# Patient Record
Sex: Female | Born: 1935
Health system: Southern US, Community
[De-identification: ages and names within clinical notes are randomized; demographics above are authoritative.]

## PROBLEM LIST (undated history)

## (undated) DIAGNOSIS — E785 Hyperlipidemia, unspecified: Secondary | ICD-10-CM

## (undated) DIAGNOSIS — T7840XA Allergy, unspecified, initial encounter: Secondary | ICD-10-CM

## (undated) DIAGNOSIS — G459 Transient cerebral ischemic attack, unspecified: Secondary | ICD-10-CM

## (undated) DIAGNOSIS — I10 Essential (primary) hypertension: Secondary | ICD-10-CM

## (undated) DIAGNOSIS — K219 Gastro-esophageal reflux disease without esophagitis: Secondary | ICD-10-CM

## (undated) HISTORY — PX: CARPAL TUNNEL RELEASE: SHX101

## (undated) HISTORY — DX: Allergy, unspecified, initial encounter: T78.40XA

## (undated) HISTORY — DX: Essential (primary) hypertension: I10

## (undated) HISTORY — PX: BUNIONECTOMY: SHX129

## (undated) HISTORY — PX: OTHER SURGICAL HISTORY: SHX169

## (undated) HISTORY — DX: Gastro-esophageal reflux disease without esophagitis: K21.9

## (undated) HISTORY — PX: ABDOMINAL HYSTERECTOMY: SHX81

## (undated) HISTORY — DX: Transient cerebral ischemic attack, unspecified: G45.9

## (undated) HISTORY — DX: Hyperlipidemia, unspecified: E78.5

## (undated) HISTORY — PX: UMBILICAL HERNIA REPAIR: SHX196

---

## 1998-03-18 ENCOUNTER — Ambulatory Visit (HOSPITAL_COMMUNITY): Admission: RE | Admit: 1998-03-18 | Discharge: 1998-03-18 | Payer: Self-pay | Admitting: Internal Medicine

## 1998-07-18 ENCOUNTER — Ambulatory Visit (HOSPITAL_COMMUNITY): Admission: RE | Admit: 1998-07-18 | Discharge: 1998-07-18 | Payer: Self-pay | Admitting: Internal Medicine

## 1998-07-18 ENCOUNTER — Encounter: Payer: Self-pay | Admitting: Internal Medicine

## 1999-01-16 ENCOUNTER — Emergency Department (HOSPITAL_COMMUNITY): Admission: EM | Admit: 1999-01-16 | Discharge: 1999-01-16 | Payer: Self-pay | Admitting: Emergency Medicine

## 1999-01-16 ENCOUNTER — Encounter: Payer: Self-pay | Admitting: Emergency Medicine

## 1999-01-26 ENCOUNTER — Other Ambulatory Visit: Admission: RE | Admit: 1999-01-26 | Discharge: 1999-01-26 | Payer: Self-pay | Admitting: Obstetrics and Gynecology

## 1999-07-22 ENCOUNTER — Encounter: Payer: Self-pay | Admitting: Internal Medicine

## 1999-07-22 ENCOUNTER — Ambulatory Visit (HOSPITAL_COMMUNITY): Admission: RE | Admit: 1999-07-22 | Discharge: 1999-07-22 | Payer: Self-pay | Admitting: Internal Medicine

## 2000-02-01 ENCOUNTER — Other Ambulatory Visit: Admission: RE | Admit: 2000-02-01 | Discharge: 2000-02-01 | Payer: Self-pay | Admitting: Obstetrics and Gynecology

## 2000-08-05 ENCOUNTER — Encounter: Payer: Self-pay | Admitting: Orthopedic Surgery

## 2000-08-05 ENCOUNTER — Encounter: Admission: RE | Admit: 2000-08-05 | Discharge: 2000-08-05 | Payer: Self-pay | Admitting: Orthopedic Surgery

## 2001-01-26 ENCOUNTER — Ambulatory Visit (HOSPITAL_COMMUNITY): Admission: RE | Admit: 2001-01-26 | Discharge: 2001-01-26 | Payer: Self-pay | Admitting: Internal Medicine

## 2001-01-26 ENCOUNTER — Encounter: Payer: Self-pay | Admitting: Internal Medicine

## 2001-02-22 ENCOUNTER — Other Ambulatory Visit: Admission: RE | Admit: 2001-02-22 | Discharge: 2001-02-22 | Payer: Self-pay | Admitting: Obstetrics and Gynecology

## 2002-05-04 ENCOUNTER — Other Ambulatory Visit: Admission: RE | Admit: 2002-05-04 | Discharge: 2002-05-04 | Payer: Self-pay | Admitting: Obstetrics and Gynecology

## 2003-01-22 ENCOUNTER — Emergency Department (HOSPITAL_COMMUNITY): Admission: EM | Admit: 2003-01-22 | Discharge: 2003-01-22 | Payer: Self-pay | Admitting: Emergency Medicine

## 2003-01-22 ENCOUNTER — Encounter: Payer: Self-pay | Admitting: Emergency Medicine

## 2003-04-16 ENCOUNTER — Emergency Department (HOSPITAL_COMMUNITY): Admission: EM | Admit: 2003-04-16 | Discharge: 2003-04-16 | Payer: Self-pay | Admitting: Emergency Medicine

## 2004-01-08 ENCOUNTER — Encounter: Admission: RE | Admit: 2004-01-08 | Discharge: 2004-01-08 | Payer: Self-pay | Admitting: Internal Medicine

## 2004-06-29 ENCOUNTER — Ambulatory Visit: Payer: Self-pay | Admitting: Internal Medicine

## 2004-07-13 ENCOUNTER — Other Ambulatory Visit: Admission: RE | Admit: 2004-07-13 | Discharge: 2004-07-13 | Payer: Self-pay | Admitting: Obstetrics and Gynecology

## 2004-07-20 ENCOUNTER — Ambulatory Visit: Payer: Self-pay | Admitting: Internal Medicine

## 2005-01-26 ENCOUNTER — Ambulatory Visit: Payer: Self-pay | Admitting: Internal Medicine

## 2005-02-15 ENCOUNTER — Ambulatory Visit: Payer: Self-pay | Admitting: Internal Medicine

## 2005-03-10 ENCOUNTER — Ambulatory Visit: Payer: Self-pay | Admitting: Internal Medicine

## 2005-03-22 ENCOUNTER — Encounter: Admission: RE | Admit: 2005-03-22 | Discharge: 2005-06-20 | Payer: Self-pay | Admitting: Internal Medicine

## 2005-06-07 ENCOUNTER — Ambulatory Visit: Payer: Self-pay | Admitting: Internal Medicine

## 2005-06-24 ENCOUNTER — Ambulatory Visit: Payer: Self-pay | Admitting: Internal Medicine

## 2005-07-13 ENCOUNTER — Ambulatory Visit: Payer: Self-pay | Admitting: Internal Medicine

## 2005-07-28 ENCOUNTER — Ambulatory Visit: Payer: Self-pay | Admitting: Internal Medicine

## 2005-09-06 ENCOUNTER — Ambulatory Visit: Payer: Self-pay | Admitting: Internal Medicine

## 2005-11-03 ENCOUNTER — Ambulatory Visit: Payer: Self-pay | Admitting: Internal Medicine

## 2005-11-11 ENCOUNTER — Ambulatory Visit: Payer: Self-pay

## 2005-11-11 ENCOUNTER — Encounter: Payer: Self-pay | Admitting: Cardiology

## 2005-12-03 ENCOUNTER — Ambulatory Visit: Payer: Self-pay | Admitting: Internal Medicine

## 2006-01-24 ENCOUNTER — Ambulatory Visit: Payer: Self-pay | Admitting: Internal Medicine

## 2006-03-09 ENCOUNTER — Ambulatory Visit: Payer: Self-pay | Admitting: Internal Medicine

## 2006-04-26 ENCOUNTER — Ambulatory Visit: Payer: Self-pay | Admitting: Internal Medicine

## 2006-06-08 ENCOUNTER — Ambulatory Visit: Payer: Self-pay | Admitting: Internal Medicine

## 2006-06-27 ENCOUNTER — Other Ambulatory Visit: Admission: RE | Admit: 2006-06-27 | Discharge: 2006-06-27 | Payer: Self-pay | Admitting: Obstetrics and Gynecology

## 2006-09-19 ENCOUNTER — Ambulatory Visit: Payer: Self-pay | Admitting: Internal Medicine

## 2006-09-19 LAB — CONVERTED CEMR LAB
BUN: 12 mg/dL (ref 6–23)
Cholesterol: 120 mg/dL (ref 0–200)
Creatinine, Ser: 0.9 mg/dL (ref 0.4–1.2)
LDL Cholesterol: 69 mg/dL (ref 0–99)
Potassium: 3.6 meq/L (ref 3.5–5.1)
Total CHOL/HDL Ratio: 2.7
Triglycerides: 38 mg/dL (ref 0–149)

## 2006-10-10 ENCOUNTER — Ambulatory Visit: Payer: Self-pay | Admitting: Internal Medicine

## 2006-11-07 DIAGNOSIS — J309 Allergic rhinitis, unspecified: Secondary | ICD-10-CM | POA: Insufficient documentation

## 2006-11-07 DIAGNOSIS — M4800 Spinal stenosis, site unspecified: Secondary | ICD-10-CM

## 2006-12-01 ENCOUNTER — Ambulatory Visit: Payer: Self-pay | Admitting: Internal Medicine

## 2007-01-26 ENCOUNTER — Ambulatory Visit: Payer: Self-pay | Admitting: Internal Medicine

## 2007-01-31 ENCOUNTER — Ambulatory Visit: Payer: Self-pay | Admitting: Internal Medicine

## 2007-03-02 ENCOUNTER — Ambulatory Visit: Payer: Self-pay | Admitting: Internal Medicine

## 2007-03-09 ENCOUNTER — Ambulatory Visit: Payer: Self-pay | Admitting: Internal Medicine

## 2007-06-14 ENCOUNTER — Ambulatory Visit: Payer: Self-pay | Admitting: Internal Medicine

## 2007-06-14 DIAGNOSIS — E782 Mixed hyperlipidemia: Secondary | ICD-10-CM

## 2007-06-14 DIAGNOSIS — J45909 Unspecified asthma, uncomplicated: Secondary | ICD-10-CM

## 2007-06-14 LAB — CONVERTED CEMR LAB
Cholesterol, target level: 200 mg/dL
HDL goal, serum: 40 mg/dL
LDL Goal: 130 mg/dL

## 2007-06-22 ENCOUNTER — Encounter (INDEPENDENT_AMBULATORY_CARE_PROVIDER_SITE_OTHER): Payer: Self-pay | Admitting: *Deleted

## 2007-06-22 LAB — CONVERTED CEMR LAB
ALT: 27 units/L (ref 0–35)
AST: 27 units/L (ref 0–37)
BUN: 10 mg/dL (ref 6–23)
Cholesterol: 136 mg/dL (ref 0–200)
Creatinine, Ser: 0.8 mg/dL (ref 0.4–1.2)
Glucose, Bld: 87 mg/dL (ref 70–99)
HDL: 48.9 mg/dL (ref >39.0)
Total CHOL/HDL Ratio: 2.8

## 2007-06-29 ENCOUNTER — Encounter (INDEPENDENT_AMBULATORY_CARE_PROVIDER_SITE_OTHER): Payer: Self-pay | Admitting: *Deleted

## 2007-06-29 ENCOUNTER — Ambulatory Visit: Payer: Self-pay | Admitting: Internal Medicine

## 2007-06-30 ENCOUNTER — Encounter: Payer: Self-pay | Admitting: Internal Medicine

## 2007-07-17 ENCOUNTER — Ambulatory Visit: Payer: Self-pay | Admitting: Internal Medicine

## 2007-07-18 ENCOUNTER — Encounter (INDEPENDENT_AMBULATORY_CARE_PROVIDER_SITE_OTHER): Payer: Self-pay | Admitting: *Deleted

## 2007-07-18 LAB — CONVERTED CEMR LAB: Potassium: 3.9 meq/L (ref 3.5–5.1)

## 2007-08-30 ENCOUNTER — Ambulatory Visit: Payer: Self-pay | Admitting: Internal Medicine

## 2007-08-30 DIAGNOSIS — I10 Essential (primary) hypertension: Secondary | ICD-10-CM

## 2007-11-08 ENCOUNTER — Ambulatory Visit: Payer: Self-pay | Admitting: Internal Medicine

## 2007-11-09 ENCOUNTER — Telehealth (INDEPENDENT_AMBULATORY_CARE_PROVIDER_SITE_OTHER): Payer: Self-pay | Admitting: *Deleted

## 2007-11-09 ENCOUNTER — Emergency Department (HOSPITAL_COMMUNITY): Admission: EM | Admit: 2007-11-09 | Discharge: 2007-11-09 | Payer: Self-pay | Admitting: Family Medicine

## 2007-11-10 LAB — CONVERTED CEMR LAB: Potassium: 3.8 meq/L (ref 3.5–5.1)

## 2007-11-14 ENCOUNTER — Encounter (INDEPENDENT_AMBULATORY_CARE_PROVIDER_SITE_OTHER): Payer: Self-pay | Admitting: *Deleted

## 2007-11-27 ENCOUNTER — Telehealth (INDEPENDENT_AMBULATORY_CARE_PROVIDER_SITE_OTHER): Payer: Self-pay | Admitting: *Deleted

## 2007-11-30 ENCOUNTER — Ambulatory Visit: Payer: Self-pay | Admitting: Internal Medicine

## 2007-11-30 DIAGNOSIS — E876 Hypokalemia: Secondary | ICD-10-CM

## 2007-12-01 LAB — CONVERTED CEMR LAB: BUN: 15 mg/dL (ref 6–23)

## 2007-12-04 ENCOUNTER — Encounter (INDEPENDENT_AMBULATORY_CARE_PROVIDER_SITE_OTHER): Payer: Self-pay | Admitting: *Deleted

## 2008-03-08 ENCOUNTER — Ambulatory Visit: Payer: Self-pay | Admitting: Internal Medicine

## 2008-05-29 ENCOUNTER — Encounter (INDEPENDENT_AMBULATORY_CARE_PROVIDER_SITE_OTHER): Payer: Self-pay | Admitting: *Deleted

## 2008-06-20 ENCOUNTER — Ambulatory Visit: Payer: Self-pay | Admitting: Internal Medicine

## 2008-06-24 ENCOUNTER — Encounter: Payer: Self-pay | Admitting: Internal Medicine

## 2008-06-24 ENCOUNTER — Telehealth (INDEPENDENT_AMBULATORY_CARE_PROVIDER_SITE_OTHER): Payer: Self-pay | Admitting: *Deleted

## 2008-06-29 LAB — CONVERTED CEMR LAB
ALT: 24 units/L (ref 0–35)
Albumin: 4.2 g/dL (ref 3.5–5.2)
Alkaline Phosphatase: 127 units/L — ABNORMAL HIGH (ref 39–117)
Bilirubin, Direct: 0.1 mg/dL (ref 0.0–0.3)
Calcium: 9.2 mg/dL (ref 8.4–10.5)
Chloride: 103 meq/L (ref 96–112)
Creatinine, Ser: 0.9 mg/dL (ref 0.4–1.2)
GFR calc Af Amer: 79 mL/min
Sodium: 143 meq/L (ref 135–145)
Total Protein: 7.8 g/dL (ref 6.0–8.3)
Triglycerides: 49 mg/dL (ref 0–149)

## 2008-07-02 ENCOUNTER — Encounter (INDEPENDENT_AMBULATORY_CARE_PROVIDER_SITE_OTHER): Payer: Self-pay | Admitting: *Deleted

## 2008-07-08 ENCOUNTER — Ambulatory Visit: Payer: Self-pay | Admitting: Vascular Surgery

## 2008-07-08 ENCOUNTER — Encounter: Payer: Self-pay | Admitting: Internal Medicine

## 2008-07-12 ENCOUNTER — Telehealth (INDEPENDENT_AMBULATORY_CARE_PROVIDER_SITE_OTHER): Payer: Self-pay | Admitting: *Deleted

## 2008-08-26 ENCOUNTER — Ambulatory Visit: Payer: Self-pay | Admitting: Internal Medicine

## 2008-08-26 LAB — CONVERTED CEMR LAB
Creatinine, Ser: 0.9 mg/dL (ref 0.4–1.2)
Potassium: 3.7 meq/L (ref 3.5–5.1)
TSH: 1.09 microintl units/mL (ref 0.35–5.50)

## 2008-08-29 ENCOUNTER — Ambulatory Visit: Payer: Self-pay | Admitting: Internal Medicine

## 2008-08-29 DIAGNOSIS — K219 Gastro-esophageal reflux disease without esophagitis: Secondary | ICD-10-CM | POA: Insufficient documentation

## 2008-08-29 DIAGNOSIS — R498 Other voice and resonance disorders: Secondary | ICD-10-CM | POA: Insufficient documentation

## 2008-09-06 ENCOUNTER — Telehealth (INDEPENDENT_AMBULATORY_CARE_PROVIDER_SITE_OTHER): Payer: Self-pay | Admitting: *Deleted

## 2008-11-29 ENCOUNTER — Encounter: Payer: Self-pay | Admitting: Internal Medicine

## 2008-11-29 ENCOUNTER — Ambulatory Visit: Payer: Self-pay | Admitting: Family Medicine

## 2008-11-29 DIAGNOSIS — I498 Other specified cardiac arrhythmias: Secondary | ICD-10-CM | POA: Insufficient documentation

## 2008-12-06 ENCOUNTER — Ambulatory Visit: Payer: Self-pay | Admitting: Internal Medicine

## 2008-12-13 ENCOUNTER — Encounter (INDEPENDENT_AMBULATORY_CARE_PROVIDER_SITE_OTHER): Payer: Self-pay | Admitting: *Deleted

## 2008-12-13 LAB — CONVERTED CEMR LAB
Basophils Relative: 0.7 % (ref 0.0–3.0)
Eosinophils Absolute: 0.2 10*3/uL (ref 0.0–0.7)
HCT: 39.1 % (ref 36.0–46.0)
MCV: 89.3 fL (ref 78.0–100.0)
Monocytes Relative: 12.7 % — ABNORMAL HIGH (ref 3.0–12.0)
Neutro Abs: 2 10*3/uL (ref 1.4–7.7)

## 2008-12-24 ENCOUNTER — Telehealth (INDEPENDENT_AMBULATORY_CARE_PROVIDER_SITE_OTHER): Payer: Self-pay | Admitting: *Deleted

## 2009-01-10 ENCOUNTER — Ambulatory Visit: Payer: Self-pay | Admitting: Family Medicine

## 2009-01-10 DIAGNOSIS — G5620 Lesion of ulnar nerve, unspecified upper limb: Secondary | ICD-10-CM | POA: Insufficient documentation

## 2009-04-08 ENCOUNTER — Telehealth (INDEPENDENT_AMBULATORY_CARE_PROVIDER_SITE_OTHER): Payer: Self-pay | Admitting: *Deleted

## 2009-04-09 ENCOUNTER — Ambulatory Visit: Payer: Self-pay | Admitting: Internal Medicine

## 2009-04-30 ENCOUNTER — Encounter (INDEPENDENT_AMBULATORY_CARE_PROVIDER_SITE_OTHER): Payer: Self-pay | Admitting: *Deleted

## 2009-05-12 ENCOUNTER — Encounter: Payer: Self-pay | Admitting: Internal Medicine

## 2009-05-13 ENCOUNTER — Encounter: Payer: Self-pay | Admitting: Obstetrics and Gynecology

## 2009-05-13 ENCOUNTER — Ambulatory Visit: Payer: Self-pay | Admitting: Obstetrics and Gynecology

## 2009-05-13 ENCOUNTER — Other Ambulatory Visit: Admission: RE | Admit: 2009-05-13 | Discharge: 2009-05-13 | Payer: Self-pay | Admitting: Obstetrics and Gynecology

## 2009-05-20 ENCOUNTER — Encounter: Payer: Self-pay | Admitting: Internal Medicine

## 2009-05-21 ENCOUNTER — Encounter: Admission: RE | Admit: 2009-05-21 | Discharge: 2009-05-21 | Payer: Self-pay | Admitting: Neurosurgery

## 2009-06-02 ENCOUNTER — Encounter: Payer: Self-pay | Admitting: Internal Medicine

## 2009-06-03 ENCOUNTER — Ambulatory Visit: Payer: Self-pay | Admitting: Obstetrics and Gynecology

## 2009-06-06 ENCOUNTER — Encounter: Payer: Self-pay | Admitting: Internal Medicine

## 2009-07-08 ENCOUNTER — Telehealth (INDEPENDENT_AMBULATORY_CARE_PROVIDER_SITE_OTHER): Payer: Self-pay | Admitting: *Deleted

## 2009-08-23 HISTORY — PX: COLONOSCOPY: SHX174

## 2009-09-24 ENCOUNTER — Ambulatory Visit: Payer: Self-pay | Admitting: Internal Medicine

## 2009-09-29 LAB — CONVERTED CEMR LAB
ALT: 19 units/L (ref 0–35)
AST: 24 units/L (ref 0–37)
Albumin: 4 g/dL (ref 3.5–5.2)
BUN: 13 mg/dL (ref 6–23)
Basophils Absolute: 0 10*3/uL (ref 0.0–0.1)
CO2: 29 meq/L (ref 19–32)
Calcium: 9.1 mg/dL (ref 8.4–10.5)
Chloride: 106 meq/L (ref 96–112)
Creatinine, Ser: 0.8 mg/dL (ref 0.4–1.2)
Eosinophils Absolute: 0.1 10*3/uL (ref 0.0–0.7)
HCT: 39.1 % (ref 36.0–46.0)
Hemoglobin: 12.8 g/dL (ref 12.0–15.0)
Platelets: 190 10*3/uL (ref 150.0–400.0)
Potassium: 3.6 meq/L (ref 3.5–5.1)
RBC: 4.28 M/uL (ref 3.87–5.11)
RDW: 13.3 % (ref 11.5–14.6)
Sodium: 142 meq/L (ref 135–145)
Total Bilirubin: 0.4 mg/dL (ref 0.3–1.2)
Total CHOL/HDL Ratio: 4
Triglycerides: 80 mg/dL (ref 0.0–149.0)
VLDL: 16 mg/dL (ref 0.0–40.0)

## 2009-10-03 ENCOUNTER — Telehealth (INDEPENDENT_AMBULATORY_CARE_PROVIDER_SITE_OTHER): Payer: Self-pay | Admitting: *Deleted

## 2009-10-10 ENCOUNTER — Telehealth (INDEPENDENT_AMBULATORY_CARE_PROVIDER_SITE_OTHER): Payer: Self-pay | Admitting: *Deleted

## 2009-10-10 ENCOUNTER — Encounter: Payer: Self-pay | Admitting: Internal Medicine

## 2009-10-21 ENCOUNTER — Encounter (INDEPENDENT_AMBULATORY_CARE_PROVIDER_SITE_OTHER): Payer: Self-pay | Admitting: *Deleted

## 2009-10-22 ENCOUNTER — Telehealth: Payer: Self-pay | Admitting: Internal Medicine

## 2009-10-23 ENCOUNTER — Ambulatory Visit: Payer: Self-pay | Admitting: Internal Medicine

## 2009-10-24 ENCOUNTER — Ambulatory Visit: Payer: Self-pay | Admitting: Internal Medicine

## 2009-10-27 ENCOUNTER — Telehealth: Payer: Self-pay | Admitting: Internal Medicine

## 2009-10-30 ENCOUNTER — Ambulatory Visit: Payer: Self-pay | Admitting: Internal Medicine

## 2009-11-04 LAB — CONVERTED CEMR LAB
ALT: 23 units/L (ref 0–35)
AST: 24 units/L (ref 0–37)
Albumin: 3.5 g/dL (ref 3.5–5.2)
HDL: 48.4 mg/dL (ref >39.00)
Total Bilirubin: 0.6 mg/dL (ref 0.3–1.2)
Total CHOL/HDL Ratio: 3
Total Protein: 7.2 g/dL (ref 6.0–8.3)

## 2009-11-06 ENCOUNTER — Encounter: Payer: Self-pay | Admitting: Internal Medicine

## 2009-12-05 ENCOUNTER — Emergency Department (HOSPITAL_COMMUNITY): Admission: EM | Admit: 2009-12-05 | Discharge: 2009-12-05 | Payer: Self-pay | Admitting: Emergency Medicine

## 2009-12-10 ENCOUNTER — Ambulatory Visit: Payer: Self-pay | Admitting: Internal Medicine

## 2009-12-11 LAB — CONVERTED CEMR LAB
AST: 26 units/L (ref 0–37)
Cholesterol: 143 mg/dL (ref 0–200)
HDL: 58.3 mg/dL (ref >39.00)
LDL Cholesterol: 78 mg/dL (ref 0–99)
Triglycerides: 35 mg/dL (ref 0.0–149.0)

## 2009-12-16 ENCOUNTER — Ambulatory Visit: Payer: Self-pay | Admitting: Internal Medicine

## 2009-12-16 DIAGNOSIS — M79609 Pain in unspecified limb: Secondary | ICD-10-CM | POA: Insufficient documentation

## 2009-12-16 DIAGNOSIS — I951 Orthostatic hypotension: Secondary | ICD-10-CM

## 2010-01-07 ENCOUNTER — Ambulatory Visit: Payer: Self-pay | Admitting: Internal Medicine

## 2010-02-16 ENCOUNTER — Telehealth (INDEPENDENT_AMBULATORY_CARE_PROVIDER_SITE_OTHER): Payer: Self-pay | Admitting: *Deleted

## 2010-02-19 ENCOUNTER — Telehealth (INDEPENDENT_AMBULATORY_CARE_PROVIDER_SITE_OTHER): Payer: Self-pay | Admitting: *Deleted

## 2010-03-17 ENCOUNTER — Telehealth (INDEPENDENT_AMBULATORY_CARE_PROVIDER_SITE_OTHER): Payer: Self-pay | Admitting: *Deleted

## 2010-03-19 ENCOUNTER — Ambulatory Visit: Payer: Self-pay | Admitting: Internal Medicine

## 2010-03-19 DIAGNOSIS — R209 Unspecified disturbances of skin sensation: Secondary | ICD-10-CM

## 2010-05-13 ENCOUNTER — Ambulatory Visit: Payer: Self-pay | Admitting: Internal Medicine

## 2010-05-13 DIAGNOSIS — R609 Edema, unspecified: Secondary | ICD-10-CM | POA: Insufficient documentation

## 2010-05-22 ENCOUNTER — Ambulatory Visit: Payer: Self-pay | Admitting: Obstetrics and Gynecology

## 2010-06-10 ENCOUNTER — Ambulatory Visit: Payer: Self-pay | Admitting: Obstetrics and Gynecology

## 2010-06-24 ENCOUNTER — Encounter: Payer: Self-pay | Admitting: Internal Medicine

## 2010-08-06 ENCOUNTER — Telehealth (INDEPENDENT_AMBULATORY_CARE_PROVIDER_SITE_OTHER): Payer: Self-pay | Admitting: *Deleted

## 2010-09-22 NOTE — Progress Notes (Signed)
Summary: Prior Auth-In Process  Phone Note Refill Request Message from:  Pharmacy on February 19, 2010 11:35 AM  Refills Requested: Medication #1:  VERAMYST 27.5 MCG/SPRAY SUSP 2 sprays each nostril once daily   Dosage confirmed as above?Dosage Confirmed   Supply Requested: 1 month   Last Refilled: 02/18/2010 PRIOR AUTH NEEDED: [phone] PT [id number]  Next Appointment Scheduled: NONE Initial call taken by: Lavell Islam,  February 19, 2010 11:36 AM  Follow-up for Phone Call        Called and requested form for prior auth.  I also reviewed when rx was first rx'ed 11/2008 by Dr.Lowne, DX: 477.9 Allergic Rhinitis  Follow-up by: Shonna Chock,  February 25, 2010 8:22 AM  Additional Follow-up for Phone Call Additional follow up Details #1::        Per Dr.Hopper Insurance usually doesnt approve this-?if patient would like another med   I spoke with patient and she is ok with another med since insurance is questioning coverage   Per Dr.Hopper, Generic Flonase Additional Follow-up by: Shonna Chock,  February 25, 2010 4:42 PM    New/Updated Medications: FLUTICASONE PROPIONATE 50 MCG/ACT SUSP (FLUTICASONE PROPIONATE) 1 spray in nostril two times a day Prescriptions: FLUTICASONE PROPIONATE 50 MCG/ACT SUSP (FLUTICASONE PROPIONATE) 1 spray in nostril two times a day  #1 x 1   Entered by:   Shonna Chock   Authorized by:   Marga Melnick MD   Signed by:   Shonna Chock on 02/25/2010   Method used:   Electronically to        [id number]* (retail)       [address] [place], [place]  [address]       Ph: [phone]       Fax: [phone]   RxID:   [id number]

## 2010-09-22 NOTE — Progress Notes (Signed)
Summary: reaction to med  Phone Note Refill Request Call back at Home Phone [phone]   Refills Requested: Medication #1:  SIMVASTATIN 40 MG TABS 1 by mouth at bedtime. pt states since starting this med she been very fatigue, and having pain in hand. pt would like to decrease this med because she feels that it is to strong. pls advise.......................Marland KitchenFelecia Deloach CMA  October 22, 2009 4:11 PM     Follow-up for Phone Call        per dr Arline Ketter this should not cause bone pain. recommend fasting lipids, hep, cpk 272.4,995.20 before reducing med.Marland KitchenMarland KitchenMarland KitchenFelecia Deloach CMA  October 22, 2009 5:10 PM   pt aware appt schedule....................Marland KitchenFelecia Deloach CMA  October 22, 2009 5:12 PM
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
## 2010-09-22 NOTE — Assessment & Plan Note (Signed)
Summary: elevated bp//fd   Vital Signs:  Patient profile:   77 year old female Weight:      141.2 pounds Temp:     98.2 degrees F oral Pulse rate:   52 / minute Resp:     18 per minute BP sitting:   126 / 80  (left arm) Cuff size:   regular  Vitals Entered By: Shonna Chock (Jan 07, 2010 12:27 PM) CC: Elevated B/P, Hypertension Management Comments REVIEWED MED LIST, PATIENT AGREED DOSE AND INSTRUCTION CORRECT    Primary Care Provider:  Hopp  CC:  Elevated B/P and Hypertension Management.  History of Present Illness: Her BP has been up to 204/100 immediately after execising as walking a mile & using reclining bike.She has no symptoms with the exercise. Repeat BP was 120/65. Awakens up @ 3 am "worrying"; Lorazepam as needed helps  Hypertension History:      She denies headache, chest pain, palpitations, dyspnea with exertion, orthopnea, PND, peripheral edema, visual symptoms, neurologic problems, syncope, and side effects from treatment.  She notes no problems with any antihypertensive medication side effects.        Positive major cardiovascular risk factors include female age 29 years old or older, hyperlipidemia, and hypertension.  Negative major cardiovascular risk factors include no history of diabetes, negative family history for ischemic heart disease, and non-tobacco-user status.        Further assessment for target organ damage reveals no history of ASHD, stroke/TIA, or peripheral vascular disease.     Allergies: 1)  Norvasc 2)  * Hctz 3)  Reglan 4)  * Lotrel 5)  Asa 6)  * Spironolactone  Physical Exam  General:  Appears much younger tha age,in no acute distress; alert,appropriate and cooperative throughout examination Lungs:  Normal respiratory effort, chest expands symmetrically. Lungs are clear to auscultation, no crackles or wheezes. Heart:  regular rhythm, no gallop, no rub, no JVD, no HJR, bradycardia, and grade 1 /6 systolic murmur @ LSB.   Abdomen:  Bowel  sounds positive,abdomen soft and non-tender without masses, organomegaly or hernias noted. No bruits or AAA Pulses:  R and L carotid,radial,dorsalis pedis and posterior tibial pulses are full and equal bilaterally Extremities:  No clubbing, cyanosis, edema but wearing support hose   Impression & Recommendations:  Problem # 1:  HYPERTENSION, ESSENTIAL NOS (ICD-401.9)  good control Her updated medication list for this problem includes:    Benicar 40 Mg Tabs (Olmesartan medoxomil) .Marland Kitchen... Take one tablet daily    Cardizem 120 Mg Tabs (Diltiazem hcl) .Marland Kitchen... 1/2 am & 1/2 in evening    Trandate 200 Mg Tabs (Labetalol hcl) .Marland Kitchen... 1 by mouth two times a day    Furosemide 40 Mg Tabs (Furosemide) .Marland Kitchen... Take one tablet as needed edema  Orders: Venipuncture (16109) TLB-Creatinine, Blood (82565-CREA) TLB-Potassium (K+) (84132-K) TLB-BUN (Urea Nitrogen) (84520-BUN)  Complete Medication List: 1)  Meclizine Hcl 25 Mg Tabs (Meclizine hcl) .Marland Kitchen.. 1 or 2 tabs two times a day prn 2)  Benicar 40 Mg Tabs (Olmesartan medoxomil) .... Take one tablet daily 3)  Cardizem 120 Mg Tabs (Diltiazem hcl) .... 1/2 am & 1/2 in evening 4)  Trandate 200 Mg Tabs (Labetalol hcl) .Marland Kitchen.. 1 by mouth two times a day 5)  Vit E,c,b Complex  6)  [place]  7)  Cal/vit D  8)  Same  9)  Soy Protein  10)  Garlic  .... ` 11)  Coq10  12)  Proair Hfa 108 (90 Base) Mcg/act Aers (Albuterol  sulfate) .Marland Kitchen.. 1-2 sprays q4 hours prn 13)  Omega Three  14)  Furosemide 40 Mg Tabs (Furosemide) .... Take one tablet as needed edema 15)  Lorazepam 0.5 Mg Tabs (Lorazepam) .... 1/2 to 1 as needed 16)  Gabapentin 100 Mg Caps (Gabapentin) .Marland Kitchen.. 1 by mouth q8 hours as needed leg pain 17)  Potassium Chloride Cr 10 Meq Cr-caps (Potassium chloride) .Marland Kitchen.. 1 once daily if lasix (furosemide) taken 18)  Veramyst 27.5 Mcg/spray Susp (Fluticasone furoate) .... 2 sprays each nostril once daily 19)  Allegra 180 Mg Tabs (Fexofenadine hcl) .Marland Kitchen.. 1 by mouth once  daily (otc) 20)  Nexium 40 Mg Pack (Esomeprazole magnesium) .Marland Kitchen.. 1 by mouth once daily 21)  Simvastatin 40 Mg Tabs (Simvastatin) .Marland Kitchen.. 1 by mouth at bedtime  Hypertension Assessment/Plan:      The patient's hypertensive risk group is category B: At least one risk factor (excluding diabetes) with no target organ damage.  Her calculated 10 year risk of coronary heart disease is 7 %.  Today's blood pressure is 126/80.    Patient Instructions: 1)  Avoid isometric exercises  as discussed.Check your Blood Pressure regularly. If it is above: 140/90 ON AVERAGE you should make an appointment.

## 2010-09-22 NOTE — Assessment & Plan Note (Signed)
Summary: cpx/ns/kdc   Vital Signs:  Patient profile:   77 year old female Height:      58 inches Weight:      142.6 pounds BMI:     29.91 Temp:     98.2 degrees F oral Pulse rate:   52 / minute Resp:     14 per minute BP sitting:   140 / 80  (left arm) Cuff size:   regular  Vitals Entered By: Shonna Chock (September 24, 2009 10:09 AM)  CC: Yearly follow-up and fasting labs , General Medical Evaluation Comments REVIEWED MED LIST, PATIENT AGREED DOSE AND INSTRUCTION CORRECT    Primary Care Provider:  Alfonse Flavors  CC:  Yearly follow-up and fasting labs  and General Medical Evaluation.  History of Present Illness: Stacy Page is here for a [facility] [employer] physical; she cancelled N-S for spinal stenosis because symptoms controlled with Tylenol & Gabapentin.  Preventive Screening-Counseling & Management  Alcohol-Tobacco     Smoking Status: quit  Caffeine-Diet-Exercise     Does Patient Exercise: yes  Allergies: 1)  Norvasc 2)  * Hctz 3)  Reglan 4)  * Lotrel 5)  Asa 6)  * Spironolactone  Past History:  Past Medical History: Allergic rhinitis Asthma, PMH of  Hypertension neg cardiac workup 3/07 for atypical chest  pain;LS Spinal Stenosis  Past Surgical History: CTS bilaterally; bunionectomy  X1 ; spur resection ; Colonoscopy negative 1996 Hysterectomy for fibroids  Family History: M  HTN,Alsheimer's,CVA, SDH post fall ; F HTN,MI in 17s; MGM HTN,MI @ 62  Social History: low salt diet 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         Cataracts  affect vision. Edema better with increased water intake daily. Hoarseness only with excess voice use.DOE up hill. Neuro:  See HPI; No numbness & tingling; paralysis or weakness.  Physical Exam  General:  Appears younger than age,well-nourished,in no acute distress; alert,appropriate and cooperative throughout examination Head:  Normocephalic and atraumatic without obvious abnormalities. Eyes:  No corneal or conjunctival inflammation noted.  Perrla. Funduscopic exam benign, without hemorrhages, exudates or papilledema.  Ears:  External ear exam shows no significant lesions or deformities.  Otoscopic examination reveals clear canals, tympanic membranes are intact bilaterally without bulging, retraction, inflammation or discharge. Hearing is grossly normal bilaterally. Nose:  External nasal examination shows no deformity or inflammation. Nasal mucosa are pink and moist without lesions or exudates. Mouth:  Oral mucosa and oropharynx without lesions or exudates.  Upper plate & lower partial Neck:  No deformities, masses, or tenderness noted. Lungs:  Normal respiratory effort, chest expands symmetrically. Lungs are clear to auscultation, no crackles or wheezes. Heart:  Normal rate and regular rhythm. S1 and S2 normal without gallop, murmur, click, rub . S4 with slurring LSB Abdomen:  Bowel sounds positive,abdomen soft and non-tender without masses, organomegaly or hernias noted. Rectal:  FOB done by Gyn Genitalia:  Dr Eda Paschal Msk:  No deformity or scoliosis noted of thoracic or lumbar spine.   Pulses:  R and L carotid,radial,dorsalis pedis and posterior tibial pulses are full and equal bilaterally Extremities:  No clubbing, cyanosis, edema, or deformity noted with normal full range of motion of  all joints.   Neurologic:  alert & oriented X3, strength normal in all extremities, and DTRs symmetrical and normal.   Skin:  Intact without suspicious lesions or rashes Cervical Nodes:  No lymphadenopathy  noted Axillary Nodes:  No palpable lymphadenopathy Psych:  memory intact for recent and remote, normally interactive, and good eye contact.     Impression & Recommendations:  Problem # 1:  PREVENTIVE HEALTH CARE (ICD-V70.0)  Orders: Venipuncture (78295) TLB-Lipid Panel (80061-LIPID) TLB-BMP (Basic Metabolic Panel-BMET) (80048-METABOL) TLB-CBC Platelet - w/Differential (85025-CBCD) TLB-Hepatic/Liver Function Pnl (80076-HEPATIC) TLB-TSH (Thyroid Stimulating Hormone) (84443-TSH)  Problem # 2:  SPINAL STENOSIS (ICD-724.00) controlled  Problem # 3:  HYPERLIPIDEMIA (ICD-272.2)  off Vytorin since 05/2009 due to copay The following medications were removed from the medication list:    Vytorin 10-20 Mg Tabs (Ezetimibe-simvastatin) .Marland Kitchen... 1/2 qd  Orders: Venipuncture (62130) TLB-Lipid Panel (80061-LIPID)  Problem # 4:  GERD (ICD-530.81) off Nexium due to copay The following medications were removed from the medication list:    Nexium 40 Mg Cpdr (Esomeprazole magnesium) .Marland Kitchen... 1 by mouth once daily  Complete Medication List: 1)  Meclizine Hcl 25 Mg Tabs (Meclizine hcl) .Marland Kitchen.. 1 or 2 tabs two times a day prn 2)  Benicar 40 Mg Tabs (Olmesartan medoxomil) .... Take one tablet daily 3)  Cardizem 120 Mg Tabs (Diltiazem hcl) .Marland Kitchen.. 1 bid 4)  Trandate 200 Mg Tabs (Labetalol hcl) .Marland Kitchen.. 1 by mouth two times a day 5)  Vit E,c,b Complex  6)  [place]  7)  Cal/vit D  8)  Same  9)  Soy Protein  10)  Garlic  .... ` 11)  Coq10  12)  Proair Hfa 108 (90 Base) Mcg/act Aers (Albuterol sulfate) .Marland Kitchen.. 1-2 sprays q4 hours prn 13)  Omega Three  14)  Furosemide 40 Mg Tabs (Furosemide) .... Take one tablet as needed edema 15)  Lorazepam 0.5 Mg Tabs (Lorazepam) .... 1/2 to 1 as needed 16)  Gabapentin 100 Mg Caps (Gabapentin) .Marland Kitchen.. 1 by mouth q8 hours as needed leg pain 17)  Potassium Chloride Cr 10 Meq Cr-caps (Potassium chloride) .Marland Kitchen.. 1 once daily if lasix (furosemide) taken 18)  Veramyst 27.5  Mcg/spray Susp (Fluticasone furoate) .... 2 sprays each nostril once daily 19)  Allegra 180 Mg Tabs (Fexofenadine hcl) .Marland Kitchen.. 1 by mouth once daily 20)  Ultram 50 Mg Tabs (Tramadol hcl) .Marland Kitchen.. 1 by mouth every 6 hours as needed  Patient Instructions: 1)  Prilosec OTC once daily until  medication coverage verified. 2)  Avoid foods high in acid (tomatoes, citrus juices, spicy foods). Avoid eating within two hours of lying down or before exercising. Do not over eat; try smaller more frequent meals. Elevate head of bed twelve inches when sleeping. 3)  Check your Blood Pressure regularly. If it is above: 140/90 ON AVERAGE you should make an appointment. 4)  Schedule a colonoscopy  to help detect colon cancer. Prescriptions: TRANDATE 200 MG TABS (LABETALOL HCL) 1 by mouth two times a day  #180 x 3   Entered and Authorized by:   Marga Melnick MD   Signed by:   Marga Melnick MD on 09/24/2009   Method used:   Print then Give to Patient   RxID:   [id number]

## 2010-09-22 NOTE — Progress Notes (Signed)
Summary: Refill Request  Phone Note Refill Request Call back at Home Phone [phone] Message from:  Patient  Refills Requested: Medication #1:  VERAMYST 27.5 MCG/SPRAY SUSP 2 sprays each nostril once daily  Method Requested: Fax to Local Pharmacy Initial call taken by: Shonna Chock,  February 16, 2010 12:43 PM    Prescriptions: VERAMYST 27.5 MCG/SPRAY SUSP (FLUTICASONE FUROATE) 2 sprays each nostril once daily  #1 x 1   Entered by:   Shonna Chock   Authorized by:   Marga Melnick MD   Signed by:   Shonna Chock on 02/16/2010   Method used:   Electronically to        [id number]* (retail)       [address] [place], [place]  [address]       Ph: [phone]       Fax: [phone]   RxID:   [id number]

## 2010-09-22 NOTE — Miscellaneous (Signed)
Summary: [facility] previsit  Clinical Lists Changes  Medications: Added new medication of MOVIPREP 100 GM  SOLR (PEG-KCL-NACL-NASULF-NA ASC-C) As per prep instructions. - Signed Rx of MOVIPREP 100 GM  SOLR (PEG-KCL-NACL-NASULF-NA ASC-C) As per prep instructions.;  #1 x 0;  Signed;  Entered by: Karl Bales RN;  Authorized by: Hilarie Fredrickson MD;  Method used: Electronically to [id number]*, [address] [place], [place]  [address], Ph: [phone], Fax: [phone]    Prescriptions: MOVIPREP 100 GM  SOLR (PEG-KCL-NACL-NASULF-NA ASC-C) As per prep instructions.  #1 x 0   Entered by:   Karl Bales RN   Authorized by:   Hilarie Fredrickson MD   Signed by:   Karl Bales RN on 10/23/2009   Method used:   Electronically to        [facility] [address] #[id number]* (retail)       [address] [place], [place]  [address]       Ph: [phone]       Fax: [phone]   RxID:   [id number]

## 2010-09-22 NOTE — Assessment & Plan Note (Signed)
Summary: follow up ed wl /cbs   Vital Signs:  Patient profile:   77 year old female Weight:      141 pounds Temp:     98.4 degrees F oral Pulse rate:   52 / minute Resp:     15 per minute BP sitting:   112 / 70  (left arm) Cuff size:   regular  Vitals Entered By: Shonna Chock (December 16, 2009 11:40 AM) CC: Follow-up visit: ER/WL for right thumb, thumb is tender at times and discolored, Hypertension Management Comments REVIEWED MED LIST, PATIENT AGREED DOSE AND INSTRUCTION CORRECT    Primary Care Provider:  Alfonse Flavors  CC:  Follow-up visit: ER/WL for right thumb, thumb is tender at times and discolored, and Hypertension Management.  History of Present Illness: She was seen in [facility] ER 12/09/2009 for pain , swelling & discoloration of R distal  thumb X 2 weeks w/o trauma or injury.Bee had lit on thumb 2 days prior; ? not bitten. Xrays negativeBP was 184/90 in ER.Marland KitchenNo PMH of Raynaud's.  Hypertension History:      She complains of dyspnea with exertion and peripheral edema, but denies headache, chest pain, palpitations, orthopnea, PND, visual symptoms, neurologic problems, syncope, and side effects from treatment.  Asthma controlled .        Positive major cardiovascular risk factors include female age 66 years old or older, hyperlipidemia, and hypertension.  Negative major cardiovascular risk factors include no history of diabetes, negative family history for ischemic heart disease, and non-tobacco-user status.        Further assessment for target organ damage reveals no history of ASHD, stroke/TIA, or peripheral vascular disease.     Allergies: 1)  Norvasc 2)  * Hctz 3)  Reglan 4)  * Lotrel 5)  Asa 6)  * Spironolactone  Review of Systems CV:  Complains of lightheadness; denies bluish discoloration of lips or nails; Recently lightheaded. Derm:  See HPI; Denies insect bite(s).  Physical Exam  General:  in no acute distress; alert,appropriate and cooperative throughout examination;  apopears younger than age Lungs:  Normal respiratory effort, chest expands symmetrically. Lungs are clear to auscultation, no crackles or wheezes. Heart:  regular rhythm, no murmur, no gallop, no rub, no JVD, no HJR, and bradycardia.  S4. Supine BP 152/112/58; sitting 138/76; standing  120/70 Abdomen:  Bowel sounds positive,abdomen soft and non-tender without masses, organomegaly or hernias noted.No AAA Pulses:  R and L radial,dorsalis pedis and posterior tibial pulses are full and equal bilaterally Extremities:  Thumb non tender with  ROM WNL; slight hyperpigmentation @ tip Neurologic:  alert & oriented X3.   Skin:  See thumb   Impression & Recommendations:  Problem # 1:  THUMB PAIN, RIGHT (ICD-729.5) ? from wasp sting  Problem # 2:  HYPOTENSION-ORTHOSTATIC (ICD-458.0)  Complete Medication List: 1)  Meclizine Hcl 25 Mg Tabs (Meclizine hcl) .Marland Kitchen.. 1 or 2 tabs two times a day prn 2)  Benicar 40 Mg Tabs (Olmesartan medoxomil) .... Take one tablet daily 3)  Cardizem 120 Mg Tabs (Diltiazem hcl) .... 1/2 am & 1 in evening 4)  Trandate 200 Mg Tabs (Labetalol hcl) .Marland Kitchen.. 1 by mouth two times a day 5)  Vit E,c,b Complex  6)  [place]  7)  Cal/vit D  8)  Same  9)  Soy Protein  10)  Garlic  .... ` 11)  Coq10  12)  Proair Hfa 108 (90 Base) Mcg/act Aers (Albuterol sulfate) .Marland Kitchen.. 1-2 sprays q4 hours prn 13)  Omega Three  14)  Furosemide 40 Mg Tabs (Furosemide) .... Take one tablet as needed edema 15)  Lorazepam 0.5 Mg Tabs (Lorazepam) .... 1/2 to 1 as needed 16)  Gabapentin 100 Mg Caps (Gabapentin) .Marland Kitchen.. 1 by mouth q8 hours as needed leg pain 17)  Potassium Chloride Cr 10 Meq Cr-caps (Potassium chloride) .Marland Kitchen.. 1 once daily if lasix (furosemide) taken 18)  Veramyst 27.5 Mcg/spray Susp (Fluticasone furoate) .... 2 sprays each nostril once daily 19)  Allegra 180 Mg Tabs (Fexofenadine hcl) .Marland Kitchen.. 1 by mouth once daily (otc) 20)  Nexium 40 Mg Pack (Esomeprazole magnesium) .Marland Kitchen.. 1 by mouth once  daily 21)  Simvastatin 40 Mg Tabs (Simvastatin) .Marland Kitchen.. 1 by mouth at bedtime  Hypertension Assessment/Plan:      The patient's hypertensive risk group is category B: At least one risk factor (excluding diabetes) with no target organ damage.  Her calculated 10 year risk of coronary heart disease is 4 %.  Today's blood pressure is 112/70.    Patient Instructions: 1)  Decrease Cardizem to 120 mg to 1/2 two times a day . Do Isometric exercises pre standing. 2)  Check your Blood Pressure regularly. If it is above: 140/90 ON AVERAGE  you should make an appointment.

## 2010-09-22 NOTE — Progress Notes (Signed)
Summary: Medication Concerns  Phone Note Call from Patient Call back at Home Phone [phone]   Caller: Patient Summary of Call: Message left on VM: patient would like RX for Simvastin to replace Vytorin that she is off of due to 85 dollar copay. Simvastin will be a 5 dollar copay. Patient would also like a RX for Nexium that she stopped x several months due to increased Copay which will now only cost 45 dollars.  Patient would like these sent to [facility].    Per Dr.Hopper have patient recheck labs in 10 weeks of med change.   Spoke with patient, informed RX's sent in and scheduled appointment to recheck labs in 10 weeks. Initial call taken by: Shonna Chock,  October 03, 2009 9:50 AM    New/Updated Medications: NEXIUM 40 MG PACK (ESOMEPRAZOLE MAGNESIUM) 1 by mouth once daily SIMVASTATIN 40 MG TABS (SIMVASTATIN) 1 by mouth at bedtime Prescriptions: SIMVASTATIN 40 MG TABS (SIMVASTATIN) 1 by mouth at bedtime  #90 x 0   Entered by:   Shonna Chock   Authorized by:   Marga Melnick MD   Signed by:   Shonna Chock on 10/03/2009   Method used:   Electronically to        [id number]* (retail)       [address] [place], [place]  [address]       Ph: [phone]       Fax: [phone]   RxID:   [id number] NEXIUM 40 MG PACK (ESOMEPRAZOLE MAGNESIUM) 1 by mouth once daily  #90 x 3   Entered by:   Shonna Chock   Authorized by:   Marga Melnick MD   Signed by:   Shonna Chock on 10/03/2009   Method used:   Electronically to        [id number]* (retail)       [address] [place], [place]  [address]       Ph: [phone]       Fax: [phone]   RxID:   [id number]

## 2010-09-22 NOTE — Procedures (Signed)
Summary: Colonoscopy  Patient: [name] Note: All result statuses are Final unless otherwise noted.  Tests: (1) Colonoscopy (COL)   COL Colonoscopy           DONE     [facility]     [address]     [place], [place]  [address]           COLONOSCOPY PROCEDURE REPORT           PATIENT:  Stacy Page, Stacy Page  MR#:  [id number]     BIRTHDATE:  [birth year], 76 yrs. old  GENDER:  female           ENDOSCOPIST:  Wilhemina Bonito. Eda Keys, MD     Referred by:  Screening / Recall           PROCEDURE DATE:  10/30/2009     PROCEDURE:  Colonoscopy with snare polypectomy     ASA CLASS:  Class II     INDICATIONS:  Routine Risk Screening           MEDICATIONS:   Fentanyl 75 mcg IV, Versed 8 mg IV           DESCRIPTION OF PROCEDURE:   After the risks benefits and     alternatives of the procedure were thoroughly explained, informed     consent was obtained.  Digital rectal exam was performed and     revealed no abnormalities.   The LB CF-[id number] endoscope     was introduced through the anus and advanced to the cecum, which     was identified by both the appendix and ileocecal valve, without     limitations. TIME TO CECUM = 4:17 MIN.The quality of the prep was     excellent, using MoviPrep.  The instrument was then slowly     withdrawn (TIME = 11:05 MIN) as the colon was fully examined.     <<PROCEDUREIMAGES>>           FINDINGS:  A diminutive polyp was found in the ascending colon.     Polyp was snared without cautery. Retrieval was successful.   This     was otherwise a normal examination of the colon.   Retroflexed     views in the rectum revealed no abnormalities.    The scope was     then withdrawn from the patient and the procedure completed.           COMPLICATIONS:  None           ENDOSCOPIC IMPRESSION:     1) Diminutive polyp in the ascending colon -REMOVED     2) Otherwise normal examination           RECOMMENDATIONS:     1) Follow up colonoscopy in 5 years IF POLYP  ADENOMATOUS           ______________________________     Wilhemina Bonito. Eda Keys, MD           CC:  Pecola Lawless, MD;The Patient           n.     Rosalie DoctorWilhemina Bonito. Eda Keys at 10/30/2009 03:36 PM           Leighton Roach, [id number]  Note: An exclamation mark (!) indicates a result that was not dispersed into the flowsheet. Document Creation Date: 10/30/2009 3:36 PM _______________________________________________________________________  (1) Order result status: Final Collection or observation date-time: 10/30/2009 15:30 Requested date-time:  Receipt date-time:  Reported date-time:  Referring Physician:   Ordering Physician: Fransico Setters [phone]) Specimen Source:  Source: Launa Grill Order Number: [id number] Lab site:   Appended Document: Colonoscopy     Procedures Next Due Date:    Colonoscopy: 10/2014

## 2010-09-22 NOTE — Consult Note (Signed)
Summary: [facility]  [facility]   Imported By: Lanelle Bal 07/07/2010 10:22:28  _____________________________________________________________________  External Attachment:    Type:   Image     Comment:   External Document

## 2010-09-22 NOTE — Letter (Signed)
Summary: [facility] Instructions  [facility]  [address] [place], [place] [address]   Phone: [phone]  Fax: [phone]       SAGE HAMMILL    01-17-1933    MRN: [id number]        Procedure Day /Date:  Thursday 10/30/2009     Arrival Time: 2:00 pm      Procedure Time: 3:00 pm     Location of Procedure:                    _ x_  [facility] (4th Floor)   PREPARATION FOR COLONOSCOPY WITH MOVIPREP   Starting 5 days prior to your procedure Saturday 3/5 do not eat nuts, seeds, popcorn, corn, beans, peas,  salads, or any raw vegetables.  Do not take any fiber supplements (e.g. Metamucil, Citrucel, and Benefiber).  THE DAY BEFORE YOUR PROCEDURE         DATE: Wednesday 3/9 1.  Drink clear liquids the entire day-NO SOLID FOOD  2.  Do not drink anything colored red or purple.  Avoid juices with pulp.  No orange juice.  3.  Drink at least 64 oz. (8 glasses) of fluid/clear liquids during the day to prevent dehydration and help the prep work efficiently.  CLEAR LIQUIDS INCLUDE: Water Jello Ice Popsicles Tea (sugar ok, no milk/cream) Powdered fruit flavored drinks Coffee (sugar ok, no milk/cream) Gatorade Juice: apple, white grape, white cranberry  Lemonade Clear bullion, consomm, broth Carbonated beverages (any kind) Strained chicken noodle soup Hard Candy                             4.  In the morning, mix first dose of MoviPrep solution:    Empty 1 Pouch A and 1 Pouch B into the disposable container    Add lukewarm drinking water to the top line of the container. Mix to dissolve    Refrigerate (mixed solution should be used within 24 hrs)  5.  Begin drinking the prep at 5:00 p.m. The MoviPrep container is divided by 4 marks.   Every 15 minutes drink the solution down to the next mark (approximately 8 oz) until the full liter is complete.   6.  Follow completed prep with 16 oz of clear liquid of your choice (Nothing red or purple).   Continue to drink clear liquids until bedtime.  7.  Before going to bed, mix second dose of MoviPrep solution:    Empty 1 Pouch A and 1 Pouch B into the disposable container    Add lukewarm drinking water to the top line of the container. Mix to dissolve    Refrigerate  THE DAY OF YOUR PROCEDURE      DATE: Thursday 3/10  Beginning at 10:00 a.m. (5 hours before procedure):         1. Every 15 minutes, drink the solution down to the next mark (approx 8 oz) until the full liter is complete.  2. Follow completed prep with 16 oz. of clear liquid of your choice.    3. You may drink clear liquids until 1:00 pm (2 HOURS BEFORE PROCEDURE).   MEDICATION INSTRUCTIONS  Unless otherwise instructed, you should take regular prescription medications with a small sip of water   as early as possible the morning of your procedure.    Additional medication instructions: _Do not take Furosemide before your procedure, the day of your procedure  OTHER INSTRUCTIONS  You will need a responsible adult at least 75 years of age to accompany you and drive you home.   This person must remain in the waiting room during your procedure.  Wear loose fitting clothing that is easily removed.  Leave jewelry and other valuables at home.  However, you may wish to bring a book to read or  an iPod/MP3 player to listen to music as you wait for your procedure to start.  Remove all body piercing jewelry and leave at home.  Total time from sign-in until discharge is approximately 2-3 hours.  You should go home directly after your procedure and rest.  You can resume normal activities the  day after your procedure.  The day of your procedure you should not:   Drive   Make legal decisions   Operate machinery   Drink alcohol   Return to work  You will receive specific instructions about eating, activities and medications before you leave.    The above instructions have been reviewed and  explained to me by  Karl Bales RN  October 23, 2009 9:41 AM      I fully understand and can verbalize these instructions _____________________________ Date _________

## 2010-09-22 NOTE — Letter (Signed)
Summary: Previsit letter  [facility]  [address] [place], [place] [address]   Phone: [phone]  Fax: [phone]       10/10/2009 MRN: [id number]  Stacy Page [address] [place], [place]  [address]  Dear [name],  Welcome to the Gastroenterology Division at [facility].    You are scheduled to see a nurse for your pre-procedure visit on 10/23/09 (Thursday) at 9:00 am on the 3rd floor at [facility], [address]  We ask that you try to arrive at our office 15 minutes prior to your appointment time to allow for check-in.  Your nurse visit will consist of discussing your medical and surgical history, your immediate family medical history, and your medications.    Please bring a complete list of all your medications or, if you prefer, bring the medication bottles and we will list them.  We will need to be aware of both prescribed and over the counter drugs.  We will need to know exact dosage information as well.  If you are on blood thinners (Coumadin, Plavix, Aggrenox, Ticlid, etc.) please call our office today/prior to your appointment, as we need to consult with your physician about holding your medication.   Please be prepared to read and sign documents such as consent forms, a financial agreement, and acknowledgement forms.  If necessary, and with your consent, a friend or relative is welcome to sit-in on the nurse visit with you.  Please bring your insurance card so that we may make a copy of it.  If your insurance requires a referral to see a specialist, please bring your referral form from your primary care physician.  No co-pay is required for this nurse visit.     If you cannot keep your appointment, please call [phone] to cancel or reschedule prior to your appointment date.  This allows [place] the opportunity to schedule an appointment for another patient in need of care.    Thank you for choosing [facility] for  your medical needs.  We appreciate the opportunity to care for you.  Please visit [place] at our website  to learn more about our practice.                     Sincerely.                                                                                                                   The Gastroenterology Division

## 2010-09-22 NOTE — Progress Notes (Signed)
Summary: resch appt  Phone Note Call from Patient Call back at Home Phone [phone]   Caller: Patient Call For: Marina Goodell Reason for Call: Talk to Nurse Summary of Call: Patient wants to speak to nurse regarding scheduling her procedure at [facility], patient states that if it's done there her insurance will cover at 100% Initial call taken by: Tawni Levy,  October 27, 2009 4:04 PM  Follow-up for Phone Call         Per Morrie Sheldon in pre-certs she has [employer] and they will pay 80% regardless of where procedure is done so pt. is going to re-check with insurance and call back.     Teryl Lucy RN  October 28, 2009 8:24 AM  Pt. wants to keep appt. here as she says she checked with insurance and was given wrong information yesterday. Follow-up by: Teryl Lucy RN,  October 28, 2009 9:06 AM

## 2010-09-22 NOTE — Progress Notes (Signed)
Summary: Schedule Colonoscopy  Phone Note Outgoing Call   Call placed by: Hortense Ramal CMA Duncan Dull),  October 10, 2009 9:52 AM Call placed to: Patient Summary of Call: Patient is due for a colonoscopy. Her last colonoscopy was in 2000. Unfortunately, I am unable to locate any records at this time as there is nothing regarding the colonoscopy in patient's paper chart, EChart, or EMR.  I have advised the patient that she is due for colonoscopy and she has set up both a previsit and colonoscopy date.  Initial call taken by: Hortense Ramal CMA Duncan Dull),  October 10, 2009 9:59 AM

## 2010-09-22 NOTE — Letter (Signed)
Summary: Patient Notice- Polyp Results  [facility]  [address] [place], [place] [address]   Phone: [phone]  Fax: [phone]        November 06, 2009 MRN: [id number]    Stacy Page [address] [place], [place]  [address]    Dear [name],  I am pleased to inform you that the colon polyp(s) removed during your recent colonoscopy was (were) found to be benign (no cancer detected) upon pathologic examination.  I recommend you have a repeat colonoscopy examination in 5 years to look for recurrent polyps, as having colon polyps increases your risk for having recurrent polyps or even colon cancer in the future.  Should you develop new or worsening symptoms of abdominal pain, bowel habit changes or bleeding from the rectum or bowels, please schedule an evaluation with either your primary care physician or with me.  Additional information/recommendations:  __ No further action with gastroenterology is needed at this time. Please      follow-up with your primary care physician for your other healthcare      needs.   Please call us if you are having persistent problems or have questions about your condition that have not been fully answered at this time.  Sincerely,  Hilarie Fredrickson MD  This letter has been electronically signed by your physician.  Appended Document: Patient Notice- Polyp Results letter mailed 3.18.11

## 2010-09-22 NOTE — Progress Notes (Signed)
Summary: Burning Arm Sensation  Phone Note Call from Patient Call back at Home Phone [phone]   Caller: Patient Summary of Call: Patient called and LM on the triage line asking for a call back.Marland KitchenMarland KitchenHarold Barban  March 17, 2010 1:10 PM  Spoke with patient and she said she is having a burning, stinging sensation in her left arm above the elbow. She said she has put icy/hot on it with no relief. Arm is not hot to the touch. Says it burns in the muscle area.   Please adivse.  Initial call taken by: Harold Barban,  March 17, 2010 1:11 PM  Follow-up for Phone Call        Per Hopp: Either a muscle tear or pinched nerve. Suggested OV. Follow-up by: Harold Barban,  March 17, 2010 3:46 PM  Additional Follow-up for Phone Call Additional follow up Details #1::        Spoke with patient and gave her information and set her up for an appt on 7.28.11. Additional Follow-up by: Harold Barban,  March 17, 2010 3:46 PM

## 2010-09-22 NOTE — Assessment & Plan Note (Signed)
Summary: BOTH ANKLES SWELLING////SPH   Vital Signs:  Patient profile:   77 year old female Weight:      145 pounds Temp:     98.2 degrees F oral Pulse rate:   60 / minute Resp:     15 per minute BP sitting:   110 / 64  (left arm) Cuff size:   regular  Vitals Entered By: Shonna Chock CMA (May 13, 2010 10:56 AM) CC: Both ankles swelling off/on    Primary Care Provider:  Alfonse Flavors  CC:  Both ankles swelling off/on .  History of Present Illness: Intermittent edema for months, especially on Sunday after standing for prolonged periods.She denies excess salt intake. TED hose from a friend controlled edema.  Current Medications (verified): 1)  Meclizine Hcl 25 Mg Tabs (Meclizine Hcl) .Marland Kitchen.. 1 or 2 Tabs Two Times A Day Prn 2)  Benicar 40 Mg Tabs (Olmesartan Medoxomil) .... Take One Tablet Daily 3)  Cardizem 120 Mg Tabs (Diltiazem Hcl) .... 1/2 Am & 1/2 in Evening 4)  Trandate 200 Mg Tabs (Labetalol Hcl) .Marland Kitchen.. 1 By Mouth Two Times A Day 5)  Vit E,c,b Complex 6)  [place] 7)  Cal/vit D 8)  Same 9)  Soy Protein 10)  Garlic .... ` 11)  Coq10 12)  Proair Hfa 108 (90 Base) Mcg/act Aers (Albuterol Sulfate) .Marland Kitchen.. 1-2 Sprays Q4 Hours Prn 13)  Omega Three 14)  Furosemide 40 Mg Tabs (Furosemide) .... Take One Tablet As Needed Edema 15)  Lorazepam 0.5 Mg  Tabs (Lorazepam) .... 1/2 To 1 As Needed 16)  Gabapentin 100 Mg  Caps (Gabapentin) .Marland Kitchen.. 1 By Mouth Q8 Hours As Needed Leg Pain 17)  Potassium Chloride Cr 10 Meq Cr-Caps (Potassium Chloride) .Marland Kitchen.. 1 Once Daily If Lasix (Furosemide) Taken 18)  Fluticasone Propionate 50 Mcg/act Susp (Fluticasone Propionate) .Marland Kitchen.. 1 Spray in Nostril Two Times A Day 19)  Allegra 180 Mg Tabs (Fexofenadine Hcl) .Marland Kitchen.. 1 By Mouth Once Daily As Needed Otc 20)  Nexium 40 Mg Pack (Esomeprazole Magnesium) .Marland Kitchen.. 1 By Mouth Once Daily 21)  Simvastatin 40 Mg Tabs (Simvastatin) .Marland Kitchen.. 1 By Mouth At Bedtime  Allergies: 1)  Norvasc 2)  * Hctz 3)  Reglan 4)  * Lotrel 5)   Asa 6)  * Spironolactone  Review of Systems CV:  Denies chest pain or discomfort, difficulty breathing at night, difficulty breathing while lying down, leg cramps with exertion, palpitations, shortness of breath with exertion, and swelling of hands.  Physical Exam  General:  well-nourished; alert,appropriate and cooperative throughout examination Lungs:  Normal respiratory effort, chest expands symmetrically. Lungs are clear to auscultation, no crackles or wheezes. Heart:  regular rhythm, no gallop, no rub, no JVD, no HJR, bradycardia, and grade 1/2-1 /6 systolic murmur LSB.   Abdomen:  Bowel sounds positive,abdomen soft and non-tender without masses, organomegaly or hernias noted. Pulses:  R and L carotid,radial,dorsalis pedis and posterior tibial pulses are full and equal bilaterally Neurologic:  alert & oriented X3.     Impression & Recommendations:  Problem # 1:  EDEMA- LOCALIZED (ICD-782.3)  Her updated medication list for this problem includes:    Furosemide 40 Mg Tabs (Furosemide) .Marland Kitchen... Take one tablet as needed edema  Complete Medication List: 1)  Meclizine Hcl 25 Mg Tabs (Meclizine hcl) .Marland Kitchen.. 1 or 2 tabs two times a day prn 2)  Benicar 40 Mg Tabs (Olmesartan medoxomil) .... Take one tablet daily 3)  Cardizem 120 Mg Tabs (Diltiazem hcl) .... 1/2 am & 1/2  in evening 4)  Trandate 200 Mg Tabs (Labetalol hcl) .Marland Kitchen.. 1 by mouth two times a day 5)  Vit E,c,b Complex  6)  [place]  7)  Cal/vit D  8)  Same  9)  Soy Protein  10)  Garlic  .... ` 11)  Coq10  12)  Proair Hfa 108 (90 Base) Mcg/act Aers (Albuterol sulfate) .Marland Kitchen.. 1-2 sprays q4 hours prn 13)  Omega Three  14)  Furosemide 40 Mg Tabs (Furosemide) .... Take one tablet as needed edema 15)  Lorazepam 0.5 Mg Tabs (Lorazepam) .... 1/2 to 1 as needed 16)  Gabapentin 100 Mg Caps (Gabapentin) .Marland Kitchen.. 1 by mouth q8 hours as needed leg pain 17)  Potassium Chloride Cr 10 Meq Cr-caps (Potassium chloride) .Marland Kitchen.. 1 once daily if lasix  (furosemide) taken 18)  Fluticasone Propionate 50 Mcg/act Susp (Fluticasone propionate) .Marland Kitchen.. 1 spray in nostril two times a day 19)  Allegra 180 Mg Tabs (Fexofenadine hcl) .Marland Kitchen.. 1 by mouth once daily as needed otc 20)  Nexium 40 Mg Pack (Esomeprazole magnesium) .Marland Kitchen.. 1 by mouth once daily 21)  Simvastatin 40 Mg Tabs (Simvastatin) .Marland Kitchen.. 1 by mouth at bedtime  Patient Instructions: 1)  Wear support hose when standing for prolonged periods & when traveling.  Appended Document: BOTH ANKLES SWELLING////SPH Flu Vaccine Consent Questions     Do you have a history of severe allergic reactions to this vaccine? no    Any prior history of allergic reactions to egg and/or gelatin? no    Do you have a sensitivity to the preservative Thimersol? no    Do you have a past history of Guillan-Barre Syndrome? no    Do you currently have an acute febrile illness? no    Have you ever had a severe reaction to latex? no    Vaccine information given and explained to patient? yes    Are you currently pregnant? no    Lot Number:[id number]   Exp Date:02/20/2011   Site Given  Left Deltoid IM

## 2010-09-24 NOTE — Progress Notes (Signed)
Summary: samples  Phone Note Call from Patient Call back at Home Phone [phone]   Caller: Patient Summary of Call: Pt called for samples of Benicar. Put samples upfront for pt to pick up. Army Fossa CMA  August 06, 2010 2:29 PM

## 2010-10-03 ENCOUNTER — Encounter: Payer: Self-pay | Admitting: Internal Medicine

## 2010-11-16 ENCOUNTER — Ambulatory Visit (INDEPENDENT_AMBULATORY_CARE_PROVIDER_SITE_OTHER): Payer: Medicare Other | Admitting: Internal Medicine

## 2010-11-16 ENCOUNTER — Encounter: Payer: Self-pay | Admitting: Internal Medicine

## 2010-11-16 VITALS — BP 122/78 | HR 52 | Temp 98.2°F | Resp 14 | Ht 58.5 in | Wt 145.8 lb

## 2010-11-16 DIAGNOSIS — E785 Hyperlipidemia, unspecified: Secondary | ICD-10-CM

## 2010-11-16 DIAGNOSIS — K219 Gastro-esophageal reflux disease without esophagitis: Secondary | ICD-10-CM

## 2010-11-16 DIAGNOSIS — Z0000 Encounter for general adult medical examination without abnormal findings: Secondary | ICD-10-CM

## 2010-11-16 DIAGNOSIS — I10 Essential (primary) hypertension: Secondary | ICD-10-CM

## 2010-11-16 DIAGNOSIS — J45909 Unspecified asthma, uncomplicated: Secondary | ICD-10-CM

## 2010-11-16 DIAGNOSIS — Z23 Encounter for immunization: Secondary | ICD-10-CM

## 2010-11-16 LAB — BASIC METABOLIC PANEL
BUN: 14 mg/dL (ref 6–23)
Chloride: 105 mEq/L (ref 96–112)
Creatinine, Ser: 1 mg/dL (ref 0.4–1.2)
Glucose, Bld: 70 mg/dL (ref 70–99)
Potassium: 4.6 mEq/L (ref 3.5–5.1)

## 2010-11-16 LAB — LIPID PANEL
Cholesterol: 163 mg/dL (ref 0–200)
LDL Cholesterol: 95 mg/dL (ref 0–99)

## 2010-11-16 LAB — HEPATIC FUNCTION PANEL
ALT: 23 U/L (ref 0–35)
AST: 23 U/L (ref 0–37)
Albumin: 4.1 g/dL (ref 3.5–5.2)
Alkaline Phosphatase: 117 U/L (ref 39–117)
Total Protein: 7.1 g/dL (ref 6.0–8.3)

## 2010-11-16 LAB — CBC WITH DIFFERENTIAL/PLATELET
Basophils Relative: 0.4 % (ref 0.0–3.0)
Eosinophils Absolute: 0.2 10*3/uL (ref 0.0–0.7)
Eosinophils Relative: 2.9 % (ref 0.0–5.0)
Hemoglobin: 12.8 g/dL (ref 12.0–15.0)
Lymphocytes Relative: 25.8 % (ref 12.0–46.0)
MCHC: 32.8 g/dL (ref 30.0–36.0)
MCV: 90.6 fl (ref 78.0–100.0)
Neutro Abs: 3.6 10*3/uL (ref 1.4–7.7)
Neutrophils Relative %: 61.8 % (ref 43.0–77.0)
RBC: 4.32 Mil/uL (ref 3.87–5.11)
WBC: 5.8 10*3/uL (ref 4.5–10.5)

## 2010-11-16 LAB — TSH: TSH: 1.15 u[IU]/mL (ref 0.35–5.50)

## 2010-11-16 MED ORDER — TETANUS-DIPHTH-ACELL PERTUSSIS 5-2.5-18.5 LF-MCG/0.5 IM SUSP
0.5000 mL | Freq: Once | INTRAMUSCULAR | Status: AC
  Administered 2010-11-16: 0.5 mL via INTRAMUSCULAR

## 2010-11-16 NOTE — Progress Notes (Signed)
Subjective:    Patient ID: Stacy Page, female    DOB: September 23, 1932, 77 y.o.   MRN: [id number]  HPI  Medicare Wellness Visit:  The following psychosocial & medical history were reviewed as required by Medicare.    social history including caffeine, alcohol,  tobacco use & exercise.    home safety, activity of daily living, seatbelt use , and smoke alarm employment : no issues   power of attorney/living will status:in place    hearing and vision evaluation ; whisper heard @ 6 ft; receiving intraocular injections for macular degeneration monthly by Dr Luciana Axe   orientation, memory and mental health assessment :no significant depressive  issues ; orientation is normal as is memory and recall ; mood and affect are also normal.   travel history, immunization status , transfusion history, and preventive health surveillance: all up to date. Never out of [place]; no transfusion    pertinent positives and negative items include: she drinks one cup of coffee per day and an occasional green tea; she does not drink alcohol ; she smoked from 1968-1984 up to 2 packs per day. [facility] or  at [facility] over 60 minutes per session.  Past medical history includes allergic rhinitis; asthma; hypertension; gastritis; spinal stenosis in context of spondylolisthesis.  Surgical history includes hysterectomy for fibroid tumors in 1982. She states she's not had an oophorectomy  but ovaries were not noted on a CT scan in May of 2000. She has had bilateral carpal tunnel release surgery. Bone spurs were removed from both feet. She's had hammertoe surgery. She's also had surgical  removal of toenails.  Her  maternal grandmother had a heart attack at age 19. She also had hypertension and degenerative joint disease. Her father also heart attack in her 54s and hypertension. Her mother had a stroke, hypertension, ulcers, arthritis, and cardiac enlargement. A half sister had colon polyps, diabetes, heart  attack, and bypass surgery.     Review of Systems  Constitutional: Negative.   HENT:        She sees Dr. Viann Shove at [facility]  For  hoarseness ; she's been diagnosed with laryngeal esophageal reflux.  She is on a PPI twice a day. Unfortunately she's  no longer able to sing because of the hoarseness.  Respiratory:        She uses albuterol each morning to control her reactive airways disease ; she doesn't not have a controller agent.  Cardiovascular:        She does have some pedal edema; she denies any other cardiac symptoms.  Gastrointestinal: Negative.   Genitourinary: Negative for dysuria and hematuria.  Skin: Negative.   Hematological: Negative.        Objective:   Physical Exam  Constitutional: She is oriented to person, place, and time. She appears well-nourished.  HENT:  Right Ear: External ear normal.  Left Ear: External ear normal.  Nose: Nose normal.  Mouth/Throat: Oropharynx is clear and moist.       Upper plate  Eyes: Conjunctivae are normal. Pupils are equal, round, and reactive to light. Right eye exhibits no discharge. Left eye exhibits no discharge.       As per Dr Luciana Axe  Neck: Neck supple. No thyromegaly present.  Cardiovascular: Normal rate, regular rhythm and intact distal pulses.  Exam reveals no gallop and no friction rub.         There is splitting of the first heart sound with  slight slurring.  Abdominal: Soft. Bowel sounds are normal. She exhibits no distension and no mass. There is no tenderness.  Genitourinary:       As per Dr Eda Paschal  Musculoskeletal: Normal range of motion.        Fusiform changes of the knees are present with slight crepitus. Lipdema is noted at the lateral malleolus bilaterally.  Lymphadenopathy:    She has no cervical adenopathy.  Neurological: She is alert and oriented to person, place, and time. She has normal reflexes.  Skin: Skin is warm and dry. No rash noted.  Psychiatric: She has a normal mood and affect.  Thought content normal.          Assessment & Plan:   #1 Medicare wellness exam ; required data recorded   #2 reactive airways disease/asthma ; she is not  a controller agent   #3 laryngo-esophageal reflux as per Dr. Delford Field  #4 macular degeneration as per Dr. Luciana Axe     #5 dyslipidemia   #6 hypertension well-controlled    Plan : #1 QVAR 40 ,an asthma  controller medication will be added as sample ; she'll be asked to check with her formulary as topreferred agent   #2 fasting labs as ordered

## 2010-11-16 NOTE — Patient Instructions (Signed)
Please monitor your blood pressure; it should average less than 135/85 ideally. If it is consistently greater than 140/90 on average please schedule appointment. Please avoid the triggers for reflux which include the following : the aspirin family (aspirin, ibuprofen, naproxen, etc. ) ; alcohol; peppermint; and excess caffeine ( coffee, tea, cola , chocolate. Do not eat within 2 hours of going to bed as this might aggravate reflux.

## 2010-11-19 ENCOUNTER — Encounter: Payer: Self-pay | Admitting: Internal Medicine

## 2010-11-20 ENCOUNTER — Encounter: Payer: Self-pay | Admitting: Internal Medicine

## 2010-12-11 ENCOUNTER — Other Ambulatory Visit: Payer: Self-pay | Admitting: Internal Medicine

## 2010-12-25 ENCOUNTER — Other Ambulatory Visit: Payer: Self-pay | Admitting: Internal Medicine

## 2011-01-05 NOTE — Procedures (Signed)
RENAL ARTERY DUPLEX EVALUATION   INDICATION:  Uncontrolled hypertension.   HISTORY:  Diabetes:  No.  Cardiac:  No.  Hypertension:  Yes.  Smoking:  Previous.   RENAL ARTERY DUPLEX FINDINGS:  Aorta-Proximal:  41 cm/s  Aorta-Mid:  54 cm/s  Aorta-Distal:  65 cm/s  Celiac Artery Origin:  148 cm/s  SMA Origin:  102 cm/s                                    RIGHT               LEFT  Renal Artery Origin:             106 cm/s            67 cm/s  Renal Artery Proximal:           89 cm/s             68 cm/s  Renal Artery Mid:                95 cm/s             62 cm/s  Renal Artery Distal:             31 cm/s             51 cm/s  Hilar Acceleration Time (AT):  Renal-Aortic Ratio (RAR):        1.96                1.26  Kidney Size:                     9.76 cm             9.8 cm  End Diastolic Ratio (EDR):  Resistive Index (RI):            0.75                0.82   IMPRESSION:  1. Patent bilateral renal arteries with no evidence of stenosis noted.  2. The bilateral kidney length measurements are within normal limits.  3. Abnormal bilateral resistive indices are noted.  4. Preliminary report was faxed to Dr. Frederik Pear office on 07/08/2008.   ___________________________________________  Janetta Hora. Fields, MD   [facility]/MEDQ  D:  07/08/2008  T:  07/08/2008  Job:  [id number]

## 2011-01-05 NOTE — Assessment & Plan Note (Signed)
[facility]                        [place] OFFICE NOTE   SITA, MANGEN                       MRN:          [id number]  DATE:01/26/2007                            DOB:          07-30-1933    Stacy Page was seen January 26, 2007 for dizziness.  She has had symptoms  after arising for the last several mornings.  It is not related to  position change, and has had no apparent rhythm or other cardiac  triggers.  It occurs after she has been up approximately 20 minutes.  She denies any vertigo.  She has no other cardiopulmonary symptoms.  Specifically, her asthma is well controlled at present.   She feels that the Diltiazem 120 mg twice a day she has been taking may  be a contributing factor.  She becomes dizzy and weak when she takes the  whole pill, and has been taking a half a pill initially, and then the  other half 2 hours later.   She denies headaches, paresthesias, or other neurologic symptoms.   She is also on Benicar 40 mg daily, labetalol 300 mg twice a day, in  addition to the Diltiazem 120 mg twice a day.   Her past history includes atypical chest pain in March of 2007, which  was evaluated with echo and stress tests which were negative.   Her blood pressure at this time is 118/80, respiratory rate is 14, pulse  is 44.  She does have arterial lower narrowing on fundal exam.  Her pupils are  equal, round, and reactive to light.  Extraocular motion is normal, as  is field of vision.  Otolaryngologic exam was unremarkable.  Air conduction is greater than  bone conduction bilaterally.  She has no carotid bruits.  She has a slow rhythm with S4 and slurring.  No thyroid pathology is noted.  Strength is equal and normal .Gait is normal .  She has no nystagmus and Romberg testing is negative.  Deep tendon  reflexes are also normal.   EKG reveals sinus bradycardia with  a rate of 44.   She will be asked to monitor her blood  pressure off the Diltiazem.  If  her blood pressure remains over 130/85 off the Diltiazem, then a  different agent will be added.  She has been intolerant to NORVASC,  HYDROCHLOROTHIAZIDE, LOTREL, and SPIRONOLACTONE.  She may be a candidate  for carvedilol or the new selective beta blocker in place of Labetalol.   She will be given meclizine 25 mg 1 half to 1 every 8 hours as needed.  If her symptoms fail to improve, then Holter monitor will be pursued.     Titus Dubin. Alwyn Ren, MD,FACP,FCCP  Electronically Signed    [facility]/MedQ  DD: 01/26/2007  DT: 01/26/2007  Job #: [id number]

## 2011-02-08 ENCOUNTER — Other Ambulatory Visit: Payer: Self-pay | Admitting: Internal Medicine

## 2011-02-15 ENCOUNTER — Other Ambulatory Visit: Payer: Self-pay | Admitting: Internal Medicine

## 2011-02-27 ENCOUNTER — Other Ambulatory Visit: Payer: Self-pay | Admitting: Internal Medicine

## 2011-03-11 ENCOUNTER — Ambulatory Visit (INDEPENDENT_AMBULATORY_CARE_PROVIDER_SITE_OTHER): Payer: Medicare Other | Admitting: Internal Medicine

## 2011-03-11 ENCOUNTER — Encounter: Payer: Self-pay | Admitting: Internal Medicine

## 2011-03-11 VITALS — BP 122/80 | HR 50 | Temp 98.4°F | Resp 12 | Ht 58.25 in | Wt 149.2 lb

## 2011-03-11 DIAGNOSIS — Z01818 Encounter for other preprocedural examination: Secondary | ICD-10-CM

## 2011-03-11 DIAGNOSIS — I10 Essential (primary) hypertension: Secondary | ICD-10-CM

## 2011-03-11 DIAGNOSIS — K219 Gastro-esophageal reflux disease without esophagitis: Secondary | ICD-10-CM

## 2011-03-11 DIAGNOSIS — R5383 Other fatigue: Secondary | ICD-10-CM

## 2011-03-11 DIAGNOSIS — E782 Mixed hyperlipidemia: Secondary | ICD-10-CM

## 2011-03-11 DIAGNOSIS — R5381 Other malaise: Secondary | ICD-10-CM

## 2011-03-11 NOTE — Patient Instructions (Signed)
You are medically cleared for this surgery if the labs are normal. Please take copy of the records, EKG and labs to the surgical appointment.

## 2011-03-11 NOTE — Progress Notes (Signed)
Subjective:    Patient ID: Stacy Page, female    DOB: 04-25-1933, 77 y.o.   MRN: [id number]  HPI Pre op evaluation: Surgical diagnosis:bunion & intertriginous spur Tentative surgical date/Surgeon:Dr Vito Severity:up to 9 Pain:sharp & burning  Activity of daily living limitation/impairment of function:prevents walking exercise Treatment to date, efficacy:padding Significant past medical history:  dyslipidemia; hypertension;GERD. No PMH of CAD, malignancy.  #1HYPERTENSION: Disease Monitoring: Blood pressure range-not monitored  Chest pain, palpitations- no      Dyspnea- only when tired from poor sleep Medications: Compliance- yes  Lightheadedness,Syncope- no    Edema- chronic    #2HYPERLIPIDEMIA: Disease Monitoring: See symptoms for Hypertension Medications: Compliance- yes  Abd pain, bowel changes- no   Muscle aches-  Leg cramps occasionally in early am  Smoking Status noted :quit 25 years ago  #3 GERD:  she specifically denies abdominal pain, dysphagia, melena, or rectal bleeding.         Review of Systems she has had fatigue for over a month.  Serial labs were reviewed dating back to 09/25/1999. Her most recent labs were from 11/16/2010. There were no significant abnormalities at that time. Specifically her TSH was 1.15; hematocrit was 39.1 and stable     Objective:   Physical Exam Gen.: Healthy and well-nourished in appearance. Alert, appropriate and cooperative throughout exam. Appears younger than age Head: Normocephalic without obvious abnormalities Eyes: No corneal or conjunctival inflammation noted.  No icterus. Mouth: Oral mucosa and oropharynx reveal no lesions, erythema  or exudates. Upper plate Neck: No deformities, masses, or tenderness noted.  Thyroid normal. Lungs: Normal respiratory effort; chest expands symmetrically. Lungs are clear to auscultation without rales, wheezes, or increased work of breathing. Heart: Slow rate; regular  rhythm.  Normal S1 and S2. No gallop, click, or rub. Grade 1/6 systolic  murmur. Abdomen: Bowel sounds normal; abdomen soft and nontender. No masses, organomegaly or hernias noted.                                                                              Musculoskeletal/extremities: No deformity or scoliosis noted of  the thoracic or lumbar spine. No clubbing, cyanosis,  or deformity noted. Tone & strength  normal.Joints normal. Nail health  Good.Support hose worn Vascular: Carotid, radial artery, dorsalis pedis and  posterior tibial pulses are full and equal. No bruits present. Neurologic: Alert and oriented x3. Deep tendon reflexes symmetrical  0-1/2 + @ knees          Skin: Intact without suspicious lesions or rashes. Lymph: No cervical, axillary lymphadenopathy present. Psych: Mood and affect are normal. Normally interactive                                                                                         Assessment & Plan:  #1 hammertoe; surgery to include  Nail  avulsion correction and  exostectomy #2 hypertension controlled  #3 dyslipidemia controlled  #4 fatigue, new as of past month  Plan: See orders. If labs are normal she is medically cleared for the surgery.  EKG revealed sinus bradycardia with minor, STABLE  nonspecific T changes but no definite ischemic change.

## 2011-03-12 LAB — CBC WITH DIFFERENTIAL/PLATELET
Basophils Absolute: 0 10*3/uL (ref 0.0–0.1)
Eosinophils Relative: 5.2 % — ABNORMAL HIGH (ref 0.0–5.0)
MCV: 90.6 fl (ref 78.0–100.0)
Monocytes Absolute: 0.6 10*3/uL (ref 0.1–1.0)
Neutrophils Relative %: 30.7 % — ABNORMAL LOW (ref 43.0–77.0)
Platelets: 180 10*3/uL (ref 150.0–400.0)
RDW: 14.9 % — ABNORMAL HIGH (ref 11.5–14.6)
WBC: 4.2 10*3/uL — ABNORMAL LOW (ref 4.5–10.5)

## 2011-03-12 LAB — AST: AST: 24 U/L (ref 0–37)

## 2011-03-12 LAB — ALT: ALT: 22 U/L (ref 0–35)

## 2011-03-12 LAB — TSH: TSH: 1.03 u[IU]/mL (ref 0.35–5.50)

## 2011-03-12 LAB — BASIC METABOLIC PANEL
BUN: 15 mg/dL (ref 6–23)
Chloride: 106 mEq/L (ref 96–112)
Creatinine, Ser: 0.8 mg/dL (ref 0.4–1.2)
GFR: 96.62 mL/min (ref >60.00)
Glucose, Bld: 81 mg/dL (ref 70–99)

## 2011-03-25 ENCOUNTER — Other Ambulatory Visit: Payer: Self-pay | Admitting: Internal Medicine

## 2011-04-01 ENCOUNTER — Ambulatory Visit (INDEPENDENT_AMBULATORY_CARE_PROVIDER_SITE_OTHER): Payer: Medicare Other | Admitting: Internal Medicine

## 2011-04-01 ENCOUNTER — Encounter: Payer: Self-pay | Admitting: Internal Medicine

## 2011-04-01 VITALS — BP 140/70 | HR 56 | Wt 150.0 lb

## 2011-04-01 DIAGNOSIS — I10 Essential (primary) hypertension: Secondary | ICD-10-CM

## 2011-04-01 DIAGNOSIS — F4323 Adjustment disorder with mixed anxiety and depressed mood: Secondary | ICD-10-CM

## 2011-04-01 MED ORDER — SERTRALINE HCL 25 MG PO TABS: 25.0000 mg | ORAL_TABLET | Freq: Every day | ORAL | Status: DC

## 2011-04-01 MED ORDER — LORAZEPAM 0.5 MG PO TABS: 0.5000 mg | ORAL_TABLET | ORAL | Status: DC

## 2011-04-01 NOTE — Progress Notes (Signed)
Subjective:    Patient ID: Stacy Page, female    DOB: [birth year], 77 y.o.   MRN: [id number]  HPI  HYPERTENSION exacerbation: @ pre op appt  8/8 , BP  was 231/99 .She had not taken her meds that am. She had had little sleep for 3 days and she was consoling her friend his grandson was dying Disease Monitoring  Blood pressure range: BP cuff broken  Chest pain: no   Dyspnea: no   Claudication: no  Medication compliance: yes,   Medication Side Effects  Lightheadedness: no, very rarely   Urinary frequency: yes, chronically due to prolapse   Edema: yes, chronic, mild  Preventitive Healthcare:  Exercise: no due to foot issues   Diet Pattern: no specific plan  Salt Restriction: yes      Review of Systems she previously a medication for stress and anxiety. She feels that she is having active anxiety depression related to her friends loss.  Adult son is in jail.      Objective:   Physical Exam Gen.: Healthy and well-nourished in appearance. Alert, appropriate and cooperative throughout exam. Appears younger than age  Eyes: No corneal or conjunctival inflammation noted.  Fundal exam is benign without hemorrhages, exudate, papilledema.Arteriolar narrowing Neck: No deformities, masses, or tenderness noted. Range of motion normal. Thyroid normal. Lungs: Normal respiratory effort; chest expands symmetrically. Lungs are clear to auscultation without rales, wheezes, or increased work of breathing. Heart: Slow rate and regular  rhythm. Normal S1 and S2. No gallop, click, or rub. Grade 1-1.5 systolic  murmur. Abdomen: Bowel sounds normal; abdomen soft and nontender. No masses, organomegaly or hernias noted. Aorta palpable but no AAA or bruits.                                                                                 Musculoskeletal/extremities: No clubbing, cyanosis, edema, or deformity noted. Nail health  good. Vascular: Carotid, radial artery, dorsalis pedis and  posterior tibial pulses  are full and equal. No bruits present. Neurologic: Alert and oriented x3. Deep tendon reflexes symmetrical and normal. No tremor         Skin: Intact without suspicious lesions or rashes.  Psych: Mood and affect are normal. Normally interactive                                                                                         Assessment & Plan:   #1 hypertension, exacerbation related to exogenous stresses and sleepless state  #2 anxiety/depression, multifactorial  #3 bradycardia; this will affect blood pressure med options. Note: presently on CCB 1/2 twice a day & Labetalol twice a day  Plan see orders and recommendations. Resident increase present blood pressure medicines or add additional medicines; focus will be on treating her anxiety and depression. She'll monitor her blood pressure at home and take  her blood pressure medicines the  Morning  of surgery unless the Surgeon statesotherwise.

## 2011-04-01 NOTE — Patient Instructions (Signed)
Your BP goal = AVERAGE < 135/85. Avoid ingestion of  excess salt/sodium.Cook with pepper & other spices . Use the salt substitute "No Salt"(unless your potassium has been elevated) OR the Mrs Dash products to season food @ the table. Avoid foods which taste salty or "vinegary" as their sodium contentet will be high.  

## 2011-05-17 LAB — POCT URINALYSIS DIP (DEVICE)
Glucose, UA: NEGATIVE
Nitrite: NEGATIVE
Operator id: 247071
Urobilinogen, UA: 0.2

## 2011-05-24 ENCOUNTER — Other Ambulatory Visit: Payer: Self-pay | Admitting: Internal Medicine

## 2011-06-09 ENCOUNTER — Other Ambulatory Visit: Payer: Self-pay | Admitting: Internal Medicine

## 2011-08-13 ENCOUNTER — Other Ambulatory Visit: Payer: Self-pay | Admitting: Internal Medicine

## 2011-08-28 ENCOUNTER — Other Ambulatory Visit: Payer: Self-pay | Admitting: Internal Medicine

## 2011-09-07 ENCOUNTER — Other Ambulatory Visit: Payer: Self-pay | Admitting: Internal Medicine

## 2011-10-27 ENCOUNTER — Other Ambulatory Visit: Payer: Self-pay | Admitting: Internal Medicine

## 2011-10-28 NOTE — Telephone Encounter (Signed)
Prescription sent to pharmacy.

## 2011-11-30 ENCOUNTER — Ambulatory Visit (INDEPENDENT_AMBULATORY_CARE_PROVIDER_SITE_OTHER): Payer: Medicare Other | Admitting: Internal Medicine

## 2011-11-30 ENCOUNTER — Encounter: Payer: Self-pay | Admitting: Internal Medicine

## 2011-11-30 VITALS — BP 128/76 | HR 56 | Temp 98.0°F | Resp 12 | Ht <= 58 in | Wt 150.8 lb

## 2011-11-30 DIAGNOSIS — T887XXA Unspecified adverse effect of drug or medicament, initial encounter: Secondary | ICD-10-CM

## 2011-11-30 DIAGNOSIS — J45909 Unspecified asthma, uncomplicated: Secondary | ICD-10-CM

## 2011-11-30 DIAGNOSIS — M4800 Spinal stenosis, site unspecified: Secondary | ICD-10-CM

## 2011-11-30 DIAGNOSIS — E782 Mixed hyperlipidemia: Secondary | ICD-10-CM

## 2011-11-30 DIAGNOSIS — Z0000 Encounter for general adult medical examination without abnormal findings: Secondary | ICD-10-CM

## 2011-11-30 DIAGNOSIS — K219 Gastro-esophageal reflux disease without esophagitis: Secondary | ICD-10-CM

## 2011-11-30 DIAGNOSIS — I10 Essential (primary) hypertension: Secondary | ICD-10-CM

## 2011-11-30 LAB — BASIC METABOLIC PANEL
CO2: 25 mEq/L (ref 19–32)
Calcium: 8.9 mg/dL (ref 8.4–10.5)
Chloride: 107 mEq/L (ref 96–112)
Creatinine, Ser: 0.9 mg/dL (ref 0.4–1.2)
Glucose, Bld: 86 mg/dL (ref 70–99)

## 2011-11-30 LAB — CBC WITH DIFFERENTIAL/PLATELET
Basophils Absolute: 0 10*3/uL (ref 0.0–0.1)
Eosinophils Absolute: 0.2 10*3/uL (ref 0.0–0.7)
Lymphocytes Relative: 35.5 % (ref 12.0–46.0)
MCHC: 32.7 g/dL (ref 30.0–36.0)
MCV: 89 fl (ref 78.0–100.0)
Monocytes Absolute: 0.4 10*3/uL (ref 0.1–1.0)
Neutro Abs: 1.8 10*3/uL (ref 1.4–7.7)
Neutrophils Relative %: 47.1 % (ref 43.0–77.0)
RDW: 14.6 % (ref 11.5–14.6)

## 2011-11-30 LAB — LIPID PANEL
HDL: 53.7 mg/dL (ref >39.00)
Triglycerides: 45 mg/dL (ref 0.0–149.0)
VLDL: 9 mg/dL (ref 0.0–40.0)

## 2011-11-30 LAB — HEPATIC FUNCTION PANEL
Albumin: 4.2 g/dL (ref 3.5–5.2)
Alkaline Phosphatase: 115 U/L (ref 39–117)
Total Protein: 7.6 g/dL (ref 6.0–8.3)

## 2011-11-30 LAB — TSH: TSH: 1.17 u[IU]/mL (ref 0.35–5.50)

## 2011-11-30 NOTE — Patient Instructions (Signed)
Preventive Health Care: Exercise  30-45  minutes a day, 3-4 days a week. Walking is especially valuable in preventing Osteoporosis. Eat a low-fat diet with lots of fruits and vegetables, up to 7-9 servings per day. Consume less than 30 grams of sugar per day from foods & drinks with High Fructose Corn Syrup as # 1,2,3 or #4 on label.  Perform the exercises for elbow pain  twice a day as discussed. Apply the anti-inflammatory gel after the exercises.  Please try to go on My Chart within the next 24 hours to allow me to release the results directly to you.

## 2011-11-30 NOTE — Progress Notes (Signed)
Subjective:    Patient ID: Stacy Page, female    DOB: [birth year], 78 y.o.   MRN: [id number]  HPI Medicare Wellness Visit:  The following psychosocial & medical history were reviewed as required by Medicare.   Social history: caffeine: 2 cups / week , alcohol:  none,  tobacco use :quit 1981  & exercise : no X 3 weeks ; previously @ gym 3-4 X/ week.   Home & personal  safety / fall risk:no issues, activities of daily living: no limitations , seatbelt use : yes , and smoke alarm employment : yes .  Power of Attorney/Living Will status : in place  Vision ( as recorded per Nurse) & Hearing  evaluation : Ophth exam 11/23/11 foe injection.See exam Orientation :oriented X 3  , memory & recall : good, spelling  testing: excellent ,and mood & affect : normal . Depression / anxiety: denied Travel history : never , immunization status :Shingles needed , transfusion history: no, and preventive health surveillance ( colonoscopies, BMD , etc as per protocol/ [facility]): colonoscopy up to date, Dental care: seen every 12 mos . Chart reviewed &  Updated. Active issues reviewed & addressed.       Review of Systems For several months she's had a flareup of tenosynovitis of the left upper extremity. This has been a chronic, recurrent issue. At this time she is using topical agents with good response. Occasionally she'll notice some discomfort above the elbow up  to the shoulder area as well.  She denies fever, chills, sweats,weight loss, numbness and tingling, extremity weakness, or incontinence of urine or stool.  She has frequent urination without dysuria, hematuria, or pyuria.     Objective:   Physical Exam Gen.: Healthy and well-nourished in appearance. Alert, appropriate and cooperative throughout exam.Appears younger than stated age  Head: Normocephalic without obvious abnormalities  Eyes: No corneal or conjunctival inflammation noted.  Extraocular motion intact. Vision grossly decreased OS Ears:  External  ear exam reveals no significant lesions or deformities. Canals clear .TMs normal. Hearing is grossly normal bilaterally. Nose: External nasal exam reveals no deformity or inflammation. Nasal mucosa are pink and moist. No lesions or exudates noted.   Mouth: Oral mucosa and oropharynx reveal no lesions or exudates. Upper plate Neck: No deformities, masses, or tenderness noted. Range of motion & Thyroid normal Lungs: Normal respiratory effort; chest expands symmetrically. Lungs are clear to auscultation without rales, wheezes, or increased work of breathing. Heart: Normal rate and rhythm. Normal S1 and S2. No gallop, click, or rub. Grade 1/2 over 6 systolic murmur  Abdomen: Bowel sounds normal; abdomen soft and nontender. No masses, organomegaly or hernias noted. Genitalia:Dr Gottsegen Musculoskeletal/extremities: Mild lordosis  noted of  the thoracic  spine. No clubbing, cyanosis,  or deformity noted. Range of motion  normal .Tone & strength  normal.Joints normal. Nail health  Good.Pes planus. Edema is not apparent though she is wearing support hose. Teno synovitis cannot be elicited with exam. She has pain with rotation of the left shoulder with some crepitus Vascular: Carotid, radial artery, dorsalis pedis and  posterior tibial pulses are full and equal. No bruits present. Neurologic: Alert and oriented x3. Deep tendon reflexes decreased at the knees, especially right knee .   Skin: Intact without suspicious lesions or rashes. Lymph: No cervical, axillary lymphadenopathy present. Psych: Mood and affect are normal. Normally interactive  Assessment & Plan:  #1 Medicare Wellness Exam; criteria met ; data entered #2 Problem List reviewed ; Assessment/ Recommendations made  #3Tenosynovitis left elbow, exercise discussed  #4 L shoulder syndrome; orthopedic referral discussed Plan: see Orders

## 2011-12-31 ENCOUNTER — Other Ambulatory Visit: Payer: Self-pay | Admitting: Internal Medicine

## 2012-01-18 ENCOUNTER — Other Ambulatory Visit: Payer: Self-pay | Admitting: Internal Medicine

## 2012-02-19 ENCOUNTER — Other Ambulatory Visit: Payer: Self-pay | Admitting: Internal Medicine

## 2012-02-21 ENCOUNTER — Encounter: Payer: Self-pay | Admitting: Internal Medicine

## 2012-02-23 ENCOUNTER — Other Ambulatory Visit: Payer: Self-pay | Admitting: Internal Medicine

## 2012-03-09 ENCOUNTER — Other Ambulatory Visit: Payer: Self-pay | Admitting: Internal Medicine

## 2012-03-10 ENCOUNTER — Telehealth: Payer: Self-pay | Admitting: Internal Medicine

## 2012-03-10 NOTE — Telephone Encounter (Signed)
Message copied by Marshell Garfinkel on Fri Mar 10, 2012  3:31 PM ------      Message from: Pecola Lawless      Created: Thu Mar 09, 2012  6:13 PM       Please  schedule  CK ; Code: [phone]

## 2012-03-10 NOTE — Telephone Encounter (Signed)
Lm w/family member for patient to call office.

## 2012-04-01 ENCOUNTER — Ambulatory Visit: Payer: Medicare Other

## 2012-04-01 ENCOUNTER — Ambulatory Visit (INDEPENDENT_AMBULATORY_CARE_PROVIDER_SITE_OTHER): Payer: Medicare Other | Admitting: Emergency Medicine

## 2012-04-01 VITALS — BP 137/71 | HR 53 | Temp 98.1°F | Resp 18 | Ht <= 58 in | Wt 153.0 lb

## 2012-04-01 DIAGNOSIS — M79609 Pain in unspecified limb: Secondary | ICD-10-CM

## 2012-04-01 DIAGNOSIS — M79646 Pain in unspecified finger(s): Secondary | ICD-10-CM

## 2012-04-01 MED ORDER — HYDROCODONE-ACETAMINOPHEN 5-325 MG PO TABS: 1.0000 | ORAL_TABLET | Freq: Four times a day (QID) | ORAL | Status: AC | PRN

## 2012-04-01 NOTE — Progress Notes (Signed)
  Subjective:    Patient ID: Stacy Page, female    DOB: 01-13-1933, 78 y.o.   MRN: [id number]  HPI patient was doing well until approximately 2 hours ago when she actually caught her left index finger in a car door. She enters now with throbbing and pain of the distal phalanx of her left index finger    Review of Systems     Objective:   Physical Exam She has thick nail polish on but she may have a subungual hematoma. She has significant bruising and swelling of the flexure polyp of the left index finger. She has normal range of motion at the PIP and DIP joint  [facility] reading (PRIMARY) by  Dr.Augustine Leverette no acute disease no fractures       Assessment & Plan:  Assessment as crushing injury finger. We'll get a film of the finger the fingernail polish off and proceed from there

## 2012-04-06 ENCOUNTER — Other Ambulatory Visit (INDEPENDENT_AMBULATORY_CARE_PROVIDER_SITE_OTHER): Payer: Medicare Other

## 2012-04-06 ENCOUNTER — Telehealth: Payer: Self-pay | Admitting: Internal Medicine

## 2012-04-06 DIAGNOSIS — T887XXA Unspecified adverse effect of drug or medicament, initial encounter: Secondary | ICD-10-CM

## 2012-04-06 DIAGNOSIS — E785 Hyperlipidemia, unspecified: Secondary | ICD-10-CM

## 2012-04-06 NOTE — Telephone Encounter (Signed)
Patient is requesting samples of Benicar. She states she only needs 1 weeks worth to get her by until she can get her refill. Pt has lab appt at 1:30 today and can pick up the samples while she is here.

## 2012-04-06 NOTE — Telephone Encounter (Signed)
Pt aware samples placed up front. 

## 2012-04-06 NOTE — Telephone Encounter (Signed)
Pt is scheduled for labs.  

## 2012-04-06 NOTE — Telephone Encounter (Signed)
Give her 3 weeks if available

## 2012-06-02 ENCOUNTER — Encounter: Payer: Self-pay | Admitting: Family Medicine

## 2012-06-14 ENCOUNTER — Other Ambulatory Visit: Payer: Self-pay | Admitting: Internal Medicine

## 2012-06-14 MED ORDER — LABETALOL HCL 200 MG PO TABS: ORAL_TABLET | ORAL | Status: DC

## 2012-06-14 NOTE — Telephone Encounter (Signed)
REFILL LABETALOL 200MG  TABLETS #180 -- last fill 8.21.13--TK 1 PO BID LAST OV 4.9.13 V70

## 2012-06-14 NOTE — Telephone Encounter (Signed)
RX sent

## 2012-06-26 ENCOUNTER — Other Ambulatory Visit: Payer: Self-pay | Admitting: Internal Medicine

## 2012-06-27 ENCOUNTER — Other Ambulatory Visit: Payer: Self-pay | Admitting: Internal Medicine

## 2012-06-27 MED ORDER — OLMESARTAN MEDOXOMIL 40 MG PO TABS: ORAL_TABLET | ORAL | Status: DC

## 2012-06-27 NOTE — Telephone Encounter (Signed)
refill BENICAR 40 MG TAKE 1 TABLET BY MOUTH EVERY DAY #60 last fill 09.04.13--last ov 4.9.13 V70

## 2012-06-27 NOTE — Telephone Encounter (Signed)
RX sent

## 2012-07-24 ENCOUNTER — Telehealth: Payer: Self-pay | Admitting: Internal Medicine

## 2012-07-24 NOTE — Telephone Encounter (Signed)
Called home number, unable to leave message. I called patient on cell and spoke with her. Patient stated she has a pending appointment for Wed for B/P, patient aware if she develops any associated symptoms: Chest pain, SOB, dizziness, blurred vision, or headache to seek medical attention at urgent care or emergency room. Patient verbalized understanding.

## 2012-07-24 NOTE — Telephone Encounter (Signed)
Attempted a promised call back to patient.  First attempt to number given ([phone]) reached daughter who stated that her mom had gone home and gave me her number as [phone].  Attempted to reach patient at this number with phone going to voicemail.  Identified voicemail message left for patient.

## 2012-07-26 ENCOUNTER — Encounter: Payer: Self-pay | Admitting: Internal Medicine

## 2012-07-26 ENCOUNTER — Ambulatory Visit (INDEPENDENT_AMBULATORY_CARE_PROVIDER_SITE_OTHER): Payer: Medicare Other | Admitting: Internal Medicine

## 2012-07-26 VITALS — BP 176/100 | HR 68 | Temp 98.8°F | Wt 157.8 lb

## 2012-07-26 DIAGNOSIS — I10 Essential (primary) hypertension: Secondary | ICD-10-CM

## 2012-07-26 LAB — BASIC METABOLIC PANEL
BUN: 11 mg/dL (ref 6–23)
Creatinine, Ser: 0.8 mg/dL (ref 0.4–1.2)
GFR: 84.47 mL/min (ref >60.00)
Glucose, Bld: 93 mg/dL (ref 70–99)
Potassium: 3.5 mEq/L (ref 3.5–5.1)

## 2012-07-26 MED ORDER — LOSARTAN POTASSIUM 100 MG PO TABS: 100.0000 mg | ORAL_TABLET | Freq: Every day | ORAL | Status: DC

## 2012-07-26 NOTE — Assessment & Plan Note (Signed)
Losartan will be added; renal function will be checked. Her thiazide is probably not an option because of previous hypokalemia. Also adding Spironolactone in place of the furosemide could be considered; but close renal function monitor will be necessary.

## 2012-07-26 NOTE — Progress Notes (Signed)
  Subjective:    Patient ID: Stacy Page, female    DOB: Apr 03, 1933, 78 y.o.   MRN: [id number]  HPI  Blood pressures have ranged from 168/84 to 184/78. Pulse is been in the upper 40s at times.  She continues to have chronic edema. She will take furosemide up to 60 mg a day on occasion. The edema had been exacerbated in the past the amlodipine. In April of this year her renal function was normal potassium was low normal at 3.5.  She has been compliant with her calcium channel blocker and labetalol.  She does restrict salt; she uses the supplement  No Salt.      Review of Systems She denies significant headache, epistaxis, chest pain, dyspnea, palpitations, or claudication.  She expresses some concern about the risk of taking statins     Objective:   Physical Exam She appears healthy and well-nourished; she is in no acute distress.Appears younger than stated age   No carotid bruits are present.  Heart rhythm and rate are normal with grade 1/2 systolic  Murmur. No gallops.  Chest is clear with no increased work of breathing  There is no evidence of aortic aneurysm or renal artery bruits  She has no clubbing . Panty stockings prevent optimal  Edema assessment.   Pedal pulses are intact   No ischemic skin changes are present         Assessment & Plan:

## 2012-07-26 NOTE — Patient Instructions (Addendum)
Blood Pressure Goal = AVERAGE < 140/90; Ideal is an AVERAGE < 135/85. This AVERAGE should be calculated from @ least 5-7 BP readings taken @ different times of day on different days of week. You should not respond to isolated BP readings , but rather the AVERAGE for that week.   If you activate My Chart; the results can be released to you as soon as they populate from the lab. If you choose not to use this program; the labs have to be reviewed, copied & mailed   causing a delay in getting the results to you.  

## 2012-08-02 ENCOUNTER — Other Ambulatory Visit: Payer: Self-pay | Admitting: Internal Medicine

## 2012-08-02 MED ORDER — GABAPENTIN 100 MG PO CAPS: ORAL_CAPSULE | ORAL | Status: DC

## 2012-08-02 NOTE — Telephone Encounter (Signed)
refill Gabapentin (Cap) 100 MG TAKE ONE CAPSULE BY MOUTH EVERY 8 HOURS AS NEEDED FOR LEG PAIN #270 last fill 7.18.13, last ov wt/a lab 12.4.13

## 2012-08-10 ENCOUNTER — Other Ambulatory Visit: Payer: Self-pay | Admitting: Internal Medicine

## 2012-08-17 ENCOUNTER — Encounter: Payer: Self-pay | Admitting: Emergency Medicine

## 2012-08-17 ENCOUNTER — Ambulatory Visit (INDEPENDENT_AMBULATORY_CARE_PROVIDER_SITE_OTHER): Payer: Medicare Other | Admitting: Emergency Medicine

## 2012-08-17 VITALS — BP 140/84 | HR 59 | Temp 98.1°F | Resp 16 | Ht 59.0 in | Wt 160.0 lb

## 2012-08-17 DIAGNOSIS — S61409A Unspecified open wound of unspecified hand, initial encounter: Secondary | ICD-10-CM

## 2012-08-17 DIAGNOSIS — M79609 Pain in unspecified limb: Secondary | ICD-10-CM

## 2012-08-17 DIAGNOSIS — M79643 Pain in unspecified hand: Secondary | ICD-10-CM

## 2012-08-17 DIAGNOSIS — S61419A Laceration without foreign body of unspecified hand, initial encounter: Secondary | ICD-10-CM

## 2012-08-17 NOTE — Progress Notes (Signed)
  Subjective:    Patient ID: Stacy Page, female    DOB: September 16, 1932, 78 y.o.   MRN: [id number]  HPI    Review of Systems     Objective:   Physical Exam   Consent obtained. Sterile prep. 1%plain lidocaine 1cc locally.  4.0 ethilon #1 s.i. Placed. Band aid     Assessment & Plan:  Wound care discussed. SR in 7 days.

## 2012-08-17 NOTE — Progress Notes (Signed)
[facility] [address] [place] [address] [phone]  Date:  08/17/2012   Name:  Stacy Page   DOB:  1933-06-28   MRN:  [id number]  PCP:  Marga Melnick, MD    Chief Complaint: Laceration   History of Present Illness:  Stacy Page is a 78 y.o. very pleasant female patient who presents with the following:  She was cleaning and dropped a glass on her hand that broke and she suffered a laceration on the dorsum of her right hand over the second [facility].  No other complaint.  Up to date ( 1 year ) on tetanus.    Patient Active Problem List  Diagnosis  . HYPERLIPIDEMIA  . HYPOPOTASSEMIA  . ULNAR NEUROPATHY, LEFT  . HYPERTENSION, ESSENTIAL NOS  . BRADYCARDIA  . HYPOTENSION-ORTHOSTATIC  . ALLERGIC RHINITIS  . ASTHMA  . GERD  . SPINAL STENOSIS  . PARESTHESIA  . EDEMA- LOCALIZED  . OTHER VOICE AND RESONANCE DISORDERS  . Unspecified adverse effect of unspecified drug, medicinal and biological substance    Past Medical History  Diagnosis Date  . Hypertension   . Hyperlipidemia   . Allergy     seasonal  . Asthma   . GERD (gastroesophageal reflux disease)     Past Surgical History  Procedure Date  . Bunionectomy   . Carpal tunnel release     Bilaterally  . Abdominal hysterectomy     for fibroids (no BSO)  . Macular degeneration     Dr Luciana Axe  ; injections    History  Substance Use Topics  . Smoking status: Former Smoker    Quit date: 08/24/1979  . Smokeless tobacco: Not on file  . Alcohol Use: No    Family History  Problem Relation Age of Onset  . Alzheimer's disease Mother   . Hypertension Mother   . Stroke Mother   . Hypertension Father   . Heart attack      in 70's  . Hypertension Maternal Grandmother   . Heart attack Maternal Grandmother 73  . Diabetes      1/2 sister    Allergies  Allergen Reactions  . Aspirin     gastritis  . Metoclopramide Hcl   . Spironolactone     ? reaction  . Amlodipine Besylate    REACTION: edema    Medication list has been reviewed and updated.  Current Outpatient Prescriptions on File Prior to Visit  Medication Sig Dispense Refill  . Ascorbic Acid (VITAMIN C) 500 MG tablet Take 500 mg by mouth daily.        Marland Kitchen b complex vitamins tablet Take 1 tablet by mouth daily.       . Calcium Carbonate (CALTRATE 600 PO) Take 600 mg by mouth. 2 by mouth once daily       . Cholecalciferol (VITAMIN D3) 2000 UNITS TABS Take 2,000 Units by mouth daily.        Marland Kitchen co-enzyme Q-10 30 MG capsule Take 30 mg by mouth once.        . diltiazem (CARDIZEM) 120 MG tablet TAKE 1 TABLET BY MOUTH TWICE DAILY  180 tablet  2  . fexofenadine (ALLEGRA) 180 MG tablet Take 180 mg by mouth daily as needed.        . fish oil-omega-3 fatty acids 1000 MG capsule Take 1 g by mouth daily.        . furosemide (LASIX) 40 MG tablet TAKE 1 TABLET BY MOUTH ONCE DAILY  AS NEEDED FOR EDEMA  90 tablet  2  . gabapentin (NEURONTIN) 100 MG capsule TAKE ONE CAPSULE BY MOUTH EVERY 8 HOURS AS NEEDED FOR LEG PAIN  270 capsule  0  . Garlic 300 MG TABS Take 300 mg by mouth daily.        . Horse Chestnut 300 MG TABS Take 300 mg by mouth 2 (two) times daily.       Marland Kitchen labetalol (NORMODYNE) 200 MG tablet TAKE 1 TABLET BY MOUTH TWICE DAILY  180 tablet  0  . LORazepam (ATIVAN) 0.5 MG tablet TAKE 1 TABLET BY MOUTH EVERY 8 TO 12 HOURS AS NEEDED . DO NOT TAKE ROUTINELY  30 tablet  1  . losartan (COZAAR) 100 MG tablet Take 1 tablet (100 mg total) by mouth daily.  30 tablet  2  . meclizine (ANTIVERT) 25 MG tablet TAKE 1-2 TABLETS BY MOUTH TWICE DAILY AS NEEDED  30 tablet  0  . Multiple Vitamins-Minerals (EYE SUPPORT PO) Take by mouth 2 (two) times daily.      Marland Kitchen olmesartan (BENICAR) 40 MG tablet TAKE 1 TABLET BY MOUTH EVERY DAY  60 tablet  5  . pantoprazole (PROTONIX) 20 MG tablet Take 20 mg by mouth daily.       . potassium chloride (MICRO-K) 10 MEQ CR capsule TAKE ONE CAPSULE BY MOUTH DAILY IF LASIX (FUROSEMIDE) IS TAKEN  90 capsule  3    . PROAIR HFA 108 (90 BASE) MCG/ACT inhaler USE 1 TO 2 PUFFS BY MOUTH EVERY 4 HOURS AS NEEDED  8.5 g  5  . sertraline (ZOLOFT) 25 MG tablet Take 1 tablet (25 mg total) by mouth daily.  30 tablet  2  . simvastatin (ZOCOR) 40 MG tablet TAKE 1 TABLET BY MOUTH EVERY NIGHT AT BEDTIME  90 tablet  1  . vitamin E 400 UNIT capsule Take 400 Units by mouth daily.          Review of Systems:  As per HPI, otherwise negative.    Physical Examination: Filed Vitals:   08/17/12 1758  BP: 211/118  Pulse: 59  Temp: 98.1 F (36.7 C)  Resp: 16   Filed Vitals:   08/17/12 1758  Height: 4\' 11"  (1.499 m)  Weight: 160 lb (72.576 kg)   Body mass index is 32.32 kg/(m^2). Ideal Body Weight: Weight in (lb) to have BMI = 25: 123.5    GEN: WDWN, NAD, Non-toxic, Alert & Oriented x 3 HEENT: Atraumatic, Normocephalic.  Ears and Nose: No external deformity. EXTR: No clubbing/cyanosis/edema NEURO: Normal gait.  PSYCH: Normally interactive. Conversant. Not depressed or anxious appearing.  Calm demeanor.  Right hand:  1 cm laceration dorsal right hand over 2nd metacarpal.  NATI no foreign body  Assessment and Plan: Laceration hand Hand pain Suture repair. Follow up in one week  Carmelina Dane, MD

## 2012-08-25 ENCOUNTER — Observation Stay (HOSPITAL_COMMUNITY)
Admission: EM | Admit: 2012-08-25 | Discharge: 2012-08-26 | Disposition: A | Discharge status: Home / Self Care | Payer: Medicare Other | Attending: Internal Medicine | Admitting: Internal Medicine

## 2012-08-25 ENCOUNTER — Emergency Department (HOSPITAL_COMMUNITY): Payer: Medicare Other

## 2012-08-25 ENCOUNTER — Encounter: Payer: Self-pay | Admitting: Physician Assistant

## 2012-08-25 ENCOUNTER — Ambulatory Visit (INDEPENDENT_AMBULATORY_CARE_PROVIDER_SITE_OTHER): Payer: Medicare Other | Admitting: Family Medicine

## 2012-08-25 ENCOUNTER — Encounter (HOSPITAL_COMMUNITY): Payer: Self-pay | Admitting: Emergency Medicine

## 2012-08-25 VITALS — BP 180/92 | HR 54 | Temp 98.4°F | Resp 16

## 2012-08-25 DIAGNOSIS — I079 Rheumatic tricuspid valve disease, unspecified: Secondary | ICD-10-CM | POA: Insufficient documentation

## 2012-08-25 DIAGNOSIS — R51 Headache: Secondary | ICD-10-CM | POA: Insufficient documentation

## 2012-08-25 DIAGNOSIS — I498 Other specified cardiac arrhythmias: Secondary | ICD-10-CM | POA: Insufficient documentation

## 2012-08-25 DIAGNOSIS — J45909 Unspecified asthma, uncomplicated: Secondary | ICD-10-CM

## 2012-08-25 DIAGNOSIS — I10 Essential (primary) hypertension: Principal | ICD-10-CM | POA: Insufficient documentation

## 2012-08-25 DIAGNOSIS — G5620 Lesion of ulnar nerve, unspecified upper limb: Secondary | ICD-10-CM

## 2012-08-25 DIAGNOSIS — E876 Hypokalemia: Secondary | ICD-10-CM

## 2012-08-25 DIAGNOSIS — R209 Unspecified disturbances of skin sensation: Secondary | ICD-10-CM

## 2012-08-25 DIAGNOSIS — Z4802 Encounter for removal of sutures: Secondary | ICD-10-CM

## 2012-08-25 DIAGNOSIS — I059 Rheumatic mitral valve disease, unspecified: Secondary | ICD-10-CM | POA: Insufficient documentation

## 2012-08-25 DIAGNOSIS — Z79899 Other long term (current) drug therapy: Secondary | ICD-10-CM | POA: Insufficient documentation

## 2012-08-25 DIAGNOSIS — I951 Orthostatic hypotension: Secondary | ICD-10-CM

## 2012-08-25 DIAGNOSIS — E782 Mixed hyperlipidemia: Secondary | ICD-10-CM | POA: Diagnosis present

## 2012-08-25 DIAGNOSIS — R42 Dizziness and giddiness: Secondary | ICD-10-CM | POA: Insufficient documentation

## 2012-08-25 DIAGNOSIS — E785 Hyperlipidemia, unspecified: Secondary | ICD-10-CM | POA: Insufficient documentation

## 2012-08-25 DIAGNOSIS — R609 Edema, unspecified: Secondary | ICD-10-CM

## 2012-08-25 DIAGNOSIS — J309 Allergic rhinitis, unspecified: Secondary | ICD-10-CM

## 2012-08-25 DIAGNOSIS — I160 Hypertensive urgency: Secondary | ICD-10-CM

## 2012-08-25 DIAGNOSIS — M4800 Spinal stenosis, site unspecified: Secondary | ICD-10-CM

## 2012-08-25 DIAGNOSIS — K219 Gastro-esophageal reflux disease without esophagitis: Secondary | ICD-10-CM | POA: Insufficient documentation

## 2012-08-25 DIAGNOSIS — T887XXA Unspecified adverse effect of drug or medicament, initial encounter: Secondary | ICD-10-CM

## 2012-08-25 DIAGNOSIS — R498 Other voice and resonance disorders: Secondary | ICD-10-CM

## 2012-08-25 MED ORDER — HYDRALAZINE HCL 20 MG/ML IJ SOLN
10.0000 mg | Freq: Once | INTRAMUSCULAR | Status: AC
  Administered 2012-08-26: 10 mg via INTRAVENOUS
  Filled 2012-08-25: qty 0.5

## 2012-08-25 NOTE — ED Notes (Signed)
Pt c/o dizziness, pt states this feels different than normal vertigo, pt states her head is spinning rather than room spinning. Pt seen at urgent care today to have suture removed. Sent to ED from that site. Neuro intact. A & O

## 2012-08-25 NOTE — ED Notes (Signed)
Pt HR dropped to 40 during urination

## 2012-08-25 NOTE — ED Notes (Signed)
Notified Dr. Rubin Payor that EKG was done.  He did not want a repeat.

## 2012-08-25 NOTE — Progress Notes (Signed)
Patient ID: Stacy Page MRN: [id number], DOB: Aug 10, 1933 79 y.o. Date of Encounter: 08/25/2012, 6:44 PM  Primary Physician: Marga Melnick, MD  Chief Complaint: Suture removal    See note from 08/17/12  HPI: 79 y.o. y/o female with injury to dorsal aspect of right hand. Here for suture removal s/p placement on 08/17/12 Doing well No issues/complaints Afebrile/ No chills No erythema No pain Able to move without difficulty Normal sensation  Her blood pressure is quite elevated today. She states that it has been this way for a long time. So long that she has stopped checking it at home. She does take her antihypertensives daily. She is followed for this by Dr. Marga Melnick. She was recently started on losartan 100 mg daily at the first part of December. She denies any chest pain, shortness of breath, palpitations, headaches, visual changes, or paresthesias.   Past Medical History  Diagnosis Date  . Hypertension   . Hyperlipidemia   . Allergy     seasonal  . Asthma   . GERD (gastroesophageal reflux disease)      Home Meds: Prior to Admission medications   Medication Sig Start Date End Date Taking? Authorizing Provider  Ascorbic Acid (VITAMIN C) 500 MG tablet Take 500 mg by mouth daily.     Yes Historical Provider, MD  Calcium Carbonate (CALTRATE 600 PO) Take 600 mg by mouth. 2 by mouth once daily    Yes Historical Provider, MD  Cholecalciferol (VITAMIN D3) 2000 UNITS TABS Take 2,000 Units by mouth daily.     Yes Historical Provider, MD  co-enzyme Q-10 30 MG capsule Take 30 mg by mouth once.     Yes Historical Provider, MD  diltiazem (CARDIZEM) 120 MG tablet TAKE 1 TABLET BY MOUTH TWICE DAILY 02/27/11  Yes Pecola Lawless, MD  fexofenadine (ALLEGRA) 180 MG tablet Take 180 mg by mouth daily as needed.     Yes Historical Provider, MD  fish oil-omega-3 fatty acids 1000 MG capsule Take 1 g by mouth daily.     Yes Historical Provider, MD  furosemide (LASIX) 40 MG tablet TAKE 1  TABLET BY MOUTH ONCE DAILY AS NEEDED FOR EDEMA 02/19/12  Yes Pecola Lawless, MD  gabapentin (NEURONTIN) 100 MG capsule TAKE ONE CAPSULE BY MOUTH EVERY 8 HOURS AS NEEDED FOR LEG PAIN 08/02/12  Yes Pecola Lawless, MD  Garlic 300 MG TABS Take 300 mg by mouth daily.     Yes Historical Provider, MD  labetalol (NORMODYNE) 200 MG tablet TAKE 1 TABLET BY MOUTH TWICE DAILY 06/14/12  Yes Pecola Lawless, MD  LORazepam (ATIVAN) 0.5 MG tablet TAKE 1 TABLET BY MOUTH EVERY 8 TO 12 HOURS AS NEEDED . DO NOT TAKE ROUTINELY 01/18/12  Yes Pecola Lawless, MD  losartan (COZAAR) 100 MG tablet Take 1 tablet (100 mg total) by mouth daily. 07/26/12  Yes Pecola Lawless, MD  meclizine (ANTIVERT) 25 MG tablet TAKE 1-2 TABLETS BY MOUTH TWICE DAILY AS NEEDED 08/10/12  Yes Pecola Lawless, MD  Multiple Vitamins-Minerals (EYE SUPPORT PO) Take by mouth 2 (two) times daily.   Yes Historical Provider, MD  olmesartan (BENICAR) 40 MG tablet TAKE 1 TABLET BY MOUTH EVERY DAY 06/27/12  Yes Pecola Lawless, MD  pantoprazole (PROTONIX) 20 MG tablet Take 20 mg by mouth daily.    Yes Historical Provider, MD  potassium chloride (MICRO-K) 10 MEQ CR capsule TAKE ONE CAPSULE BY MOUTH DAILY IF LASIX (FUROSEMIDE) IS TAKEN 03/09/12  Yes Chrissie Noa  Minerva Ends, MD  PROAIR HFA 108 (90 BASE) MCG/ACT inhaler USE 1 TO 2 PUFFS BY MOUTH EVERY 4 HOURS AS NEEDED 08/28/11  Yes Pecola Lawless, MD  sertraline (ZOLOFT) 25 MG tablet Take 1 tablet (25 mg total) by mouth daily. 04/01/11 08/23/97 Yes Pecola Lawless, MD  simvastatin (ZOCOR) 40 MG tablet TAKE 1 TABLET BY MOUTH EVERY NIGHT AT BEDTIME 02/23/12  Yes Pecola Lawless, MD  b complex vitamins tablet Take 1 tablet by mouth daily.    No Historical Provider, MD  Horse Chestnut 300 MG TABS Take 300 mg by mouth 2 (two) times daily.    No Historical Provider, MD  vitamin E 400 UNIT capsule Take 400 Units by mouth daily.     No Historical Provider, MD    Allergies:  Allergies  Allergen Reactions  . Aspirin      gastritis  . Metoclopramide Hcl   . Spironolactone     ? reaction  . Amlodipine Besylate     REACTION: edema    Physical Exam: Blood pressure 180/92, pulse 54, temperature 98.4 F (36.9 C), resp. rate 16., There is no height or weight on file to calculate BMI. General: Well developed, well nourished, in no acute distress. Head: Normocephalic, atraumatic, sclera non-icteric, no xanthomas, nares are without discharge.  Neck: Supple. Lungs: Breathing is unlabored. Heart: Normal rate. Msk:  Strength and tone appear normal for age. Wound: Wound well healed without erythema, swelling, or tenderness to palpation. FROM and 5/5 strength with normal sensation throughout including 2 point discrimination Skin: See above, otherwise dry without rash or erythema. Extremities: No clubbing or cyanosis. No edema. Neuro: Alert and oriented X 3. Moves all extremities spontaneously.  Psych:  Responds to questions appropriately with a normal affect.   PROCEDURE: Verbal consent obtained. One suture removed without difficulty.  Assessment and Plan: 79 y.o. y/o female here for suture removal for wound described above and with uncontrolled hypertension.  1. Suture removal -Suture removed per above -Wound resolved -RTC prn  2. Hypertension -Uncontrolled -Followed by Dr. Marga Melnick -I have advised her to contact her primary care physician's office this evening for advise regarding addition of another antihypertensive until she can come in for evaluation during the first part of the week. She would rather her PCP manage her medication and I feel this is appropriate given review of her chart. She states it has been uncontrolled for quite some time now. So much so that she stopped checking it at home. She agrees to call her PCP this evening for further direction of her hypertension and antihypertensive therapy. -She has been given 911 precautions and understands them -Discussed with Dr.  Katrinka Blazing  Signed, Eula Listen, PA-C 08/25/2012 6:44 PM

## 2012-08-25 NOTE — ED Provider Notes (Signed)
History     CSN: [id number]  Arrival date & time 08/25/12  2039   First MD Initiated Contact with Patient 08/25/12 2302      Chief Complaint  Patient presents with  . Hypertension    (Consider location/radiation/quality/duration/timing/severity/associated sxs/prior treatment) Patient is a 79 y.o. female presenting with hypertension. The history is provided by the patient.  Hypertension This is a recurrent problem. Associated symptoms include headaches. Pertinent negatives include no chest pain, no abdominal pain and no shortness of breath.   patient went to urgent care to have sutures removed right hand. Her blood pressure was found to be elevated. She is on several blood pressure medications. She states she feels as if the room is moving around. He is not lightheaded. No chest pain. She does have a dull headache. She has a history of vertigo, but states this feels different. She states she had a little bit difficulty walking due to the dizziness. No lateralizing numbness or weakness. No vision changes  Past Medical History  Diagnosis Date  . Hypertension   . Hyperlipidemia   . Allergy     seasonal  . Asthma   . GERD (gastroesophageal reflux disease)     Past Surgical History  Procedure Date  . Bunionectomy   . Carpal tunnel release     Bilaterally  . Abdominal hysterectomy     for fibroids (no BSO)  . Macular degeneration     Dr Luciana Axe  ; injections    Family History  Problem Relation Age of Onset  . Alzheimer's disease Mother   . Hypertension Mother   . Stroke Mother   . Hypertension Father   . Heart attack      in 70's  . Hypertension Maternal Grandmother   . Heart attack Maternal Grandmother 73  . Diabetes      1/2 sister    History  Substance Use Topics  . Smoking status: Former Smoker    Quit date: 08/24/1979  . Smokeless tobacco: Not on file  . Alcohol Use: No    OB History    Grav Para Term Preterm Abortions TAB SAB Ect Mult Living                    Review of Systems  Constitutional: Negative for activity change and appetite change.  HENT: Negative for neck stiffness.   Eyes: Negative for pain.  Respiratory: Negative for chest tightness and shortness of breath.   Cardiovascular: Negative for chest pain and leg swelling.  Gastrointestinal: Negative for nausea, vomiting, abdominal pain and diarrhea.  Genitourinary: Negative for flank pain.  Musculoskeletal: Negative for back pain.  Skin: Negative for rash.  Neurological: Positive for dizziness and headaches. Negative for weakness, light-headedness and numbness.  Psychiatric/Behavioral: Negative for behavioral problems.    Allergies  Aspirin; Metoclopramide hcl; Spironolactone; and Amlodipine besylate  Home Medications   Current Outpatient Rx  Name  Route  Sig  Dispense  Refill  . VITAMIN C 500 MG PO TABS   Oral   Take 500 mg by mouth daily.           . B COMPLEX PO TABS   Oral   Take 1 tablet by mouth daily.          Marland Kitchen CALTRATE 600 PO   Oral   Take 600 mg by mouth. 2 by mouth once daily          . VITAMIN D3 2000 UNITS PO TABS   Oral  Take 2,000 Units by mouth daily.           Marland Kitchen COENZYME Q10 30 MG PO CAPS   Oral   Take 30 mg by mouth once.           Marland Kitchen DILTIAZEM HCL 120 MG PO TABS      TAKE 1 TABLET BY MOUTH TWICE DAILY   180 tablet   2   . FEXOFENADINE HCL 180 MG PO TABS   Oral   Take 180 mg by mouth daily as needed.           . OMEGA-3 FATTY ACIDS 1000 MG PO CAPS   Oral   Take 1 g by mouth daily.           . FUROSEMIDE 40 MG PO TABS      TAKE 1 TABLET BY MOUTH ONCE DAILY AS NEEDED FOR EDEMA   90 tablet   2   . GABAPENTIN 100 MG PO CAPS      TAKE ONE CAPSULE BY MOUTH EVERY 8 HOURS AS NEEDED FOR LEG PAIN   270 capsule   0   . GARLIC 300 MG PO TABS   Oral   Take 300 mg by mouth daily.           Marland Kitchen HORSE CHESTNUT 300 MG PO TABS   Oral   Take 300 mg by mouth 2 (two) times daily.          Marland Kitchen LABETALOL HCL 200 MG PO  TABS      TAKE 1 TABLET BY MOUTH TWICE DAILY   180 tablet   0   . LORAZEPAM 0.5 MG PO TABS      TAKE 1 TABLET BY MOUTH EVERY 8 TO 12 HOURS AS NEEDED . DO NOT TAKE ROUTINELY   30 tablet   1   . LOSARTAN POTASSIUM 100 MG PO TABS   Oral   Take 1 tablet (100 mg total) by mouth daily.   30 tablet   2   . MECLIZINE HCL 25 MG PO TABS      TAKE 1-2 TABLETS BY MOUTH TWICE DAILY AS NEEDED   30 tablet   0   . EYE SUPPORT PO   Oral   Take by mouth 2 (two) times daily.         Marland Kitchen OLMESARTAN MEDOXOMIL 40 MG PO TABS      TAKE 1 TABLET BY MOUTH EVERY DAY   60 tablet   5   . PANTOPRAZOLE SODIUM 20 MG PO TBEC   Oral   Take 20 mg by mouth daily.          Marland Kitchen POTASSIUM CHLORIDE ER 10 MEQ PO CPCR      TAKE ONE CAPSULE BY MOUTH DAILY IF LASIX (FUROSEMIDE) IS TAKEN   90 capsule   3   . PROAIR HFA 108 (90 BASE) MCG/ACT IN AERS      USE 1 TO 2 PUFFS BY MOUTH EVERY 4 HOURS AS NEEDED   8.5 g   5   . SERTRALINE HCL 25 MG PO TABS   Oral   Take 1 tablet (25 mg total) by mouth daily.   30 tablet   2   . SIMVASTATIN 40 MG PO TABS      TAKE 1 TABLET BY MOUTH EVERY NIGHT AT BEDTIME   90 tablet   1     BP 149/76  Pulse 59  Temp 98.3 F (36.8 C) (Oral)  Resp  18  SpO2 100%  Physical Exam  Nursing note and vitals reviewed. Constitutional: She is oriented to person, place, and time. She appears well-developed and well-nourished.  HENT:  Head: Normocephalic and atraumatic.  Eyes: EOM are normal. Pupils are equal, round, and reactive to light.  Neck: Normal range of motion. Neck supple.  Cardiovascular: Normal rate, regular rhythm and normal heart sounds.   No murmur heard. Pulmonary/Chest: Effort normal and breath sounds normal. No respiratory distress. She has no wheezes. She has no rales.  Abdominal: Soft. Bowel sounds are normal. She exhibits no distension. There is no tenderness. There is no rebound and no guarding.  Musculoskeletal: Normal range of motion.   Neurological: She is alert and oriented to person, place, and time. No cranial nerve deficit.       Mild nystagmus with left gaze. Finger-nose intact bilaterally. Good grip strength bilaterally.  Skin: Skin is warm and dry.  Psychiatric: She has a normal mood and affect. Her speech is normal.    ED Course  Procedures (including critical care time)  Labs Reviewed  BASIC METABOLIC PANEL - Abnormal; Notable for the following:    Potassium 3.4 (*)     GFR calc non Af Amer 81 (*)     All other components within normal limits  CBC WITH DIFFERENTIAL  TROPONIN I   Ct Head Wo Contrast  08/25/2012  *RADIOLOGY REPORT*  Clinical Data: Dizziness.  Hypertension.  CT HEAD WITHOUT CONTRAST  Technique:  Contiguous axial images were obtained from the base of the skull through the vertex without contrast.  Comparison: Report dated 01/22/2003.  Findings: Extensive patchy white matter low density in both cerebral hemispheres.  Mildly enlarged ventricles and subarachnoid spaces.  No intracranial hemorrhage, mass lesion or CT evidence of acute infarction.  Bilateral hyperostosis frontalis.  The included paranasal sinuses are clear.  IMPRESSION:  1.  No acute abnormality. 2.  Extensive chronic small vessel white matter ischemic changes in both cerebral hemispheres. 3.  Mild atrophy.   Original Report Authenticated By: Beckie Salts, M.D.    Dg Chest Port 1 View  08/25/2012  *RADIOLOGY REPORT*  Clinical Data: Uncontrolled hypertension.  PORTABLE CHEST - 1 VIEW  Comparison: None.  Findings: Borderline enlarged cardiac silhouette.  Mildly prominent pulmonary vasculature.  Clear lungs.  Unremarkable bones.  IMPRESSION: Borderline cardiomegaly and mild pulmonary vascular congestion.   Original Report Authenticated By: Beckie Salts, M.D.      1. Hypertensive urgency   2. Lightheadedness      Date: 08/26/2012  Rate: 53  Rhythm: sinus bradycardia  QRS Axis: normal  Intervals: normal  ST/T Wave abnormalities: normal   Conduction Disutrbances:none  Narrative Interpretation: bradycardia is new.   Old EKG Reviewed: changes noted    MDM  Patient presents with hypertension and lightheadedness. She states it feels as if she is awaiting around. No chest pain. She has had some bradycardia. Blood pressure improved with treatment with IV hydralazine. CT of the head is reassuring. She does have some mild nystagmus. The setting of severe hypertension and focal neural findings patient will need workup for neurologic cause. She will be admitted to the hospitalist        [facility] R. Rubin Payor, MD 08/26/12 1610

## 2012-08-25 NOTE — ED Notes (Signed)
Patient was seen at urgent care to have sutures removed from her hand. While there patients BP found to be extremely elevated. Patient reports her baseline BP to be 140/70's. Patient states she takes Labetalol, Benecar, Ditilzam, and an additional medication she can not recall, she believes it is the same as Orthoptist with a different name. Patient denies missing any doses of her blood pressure meds. Patient denies pain. Patient reports history of vertigo, states this is different, she feels lightheaded but not like everything is moving.

## 2012-08-26 ENCOUNTER — Encounter (HOSPITAL_COMMUNITY): Payer: Self-pay | Admitting: Internal Medicine

## 2012-08-26 DIAGNOSIS — I160 Hypertensive urgency: Secondary | ICD-10-CM | POA: Diagnosis present

## 2012-08-26 DIAGNOSIS — J309 Allergic rhinitis, unspecified: Secondary | ICD-10-CM

## 2012-08-26 DIAGNOSIS — I369 Nonrheumatic tricuspid valve disorder, unspecified: Secondary | ICD-10-CM

## 2012-08-26 DIAGNOSIS — R0989 Other specified symptoms and signs involving the circulatory and respiratory systems: Secondary | ICD-10-CM

## 2012-08-26 LAB — COMPREHENSIVE METABOLIC PANEL
ALT: 15 U/L (ref 0–35)
AST: 18 U/L (ref 0–37)
Albumin: 3.7 g/dL (ref 3.5–5.2)
Alkaline Phosphatase: 125 U/L — ABNORMAL HIGH (ref 39–117)
BUN: 11 mg/dL (ref 6–23)
Potassium: 3.3 mEq/L — ABNORMAL LOW (ref 3.5–5.1)
Sodium: 140 mEq/L (ref 135–145)
Total Protein: 7.2 g/dL (ref 6.0–8.3)

## 2012-08-26 LAB — CBC WITH DIFFERENTIAL/PLATELET
Basophils Absolute: 0 10*3/uL (ref 0.0–0.1)
Eosinophils Relative: 2 % (ref 0–5)
HCT: 42.3 % (ref 36.0–46.0)
Hemoglobin: 14.2 g/dL (ref 12.0–15.0)
Lymphocytes Relative: 30 % (ref 12–46)
Lymphs Abs: 1.5 10*3/uL (ref 0.7–4.0)
MCV: 86.2 fL (ref 78.0–100.0)
Monocytes Absolute: 0.4 10*3/uL (ref 0.1–1.0)
Monocytes Relative: 8 % (ref 3–12)
Neutro Abs: 2.9 10*3/uL (ref 1.7–7.7)
RBC: 4.91 MIL/uL (ref 3.87–5.11)
WBC: 4.9 10*3/uL (ref 4.0–10.5)

## 2012-08-26 LAB — CBC
HCT: 39.3 % (ref 36.0–46.0)
MCH: 28.4 pg (ref 26.0–34.0)
MCV: 84.5 fL (ref 78.0–100.0)
RDW: 14.2 % (ref 11.5–15.5)
WBC: 5.6 10*3/uL (ref 4.0–10.5)

## 2012-08-26 LAB — BASIC METABOLIC PANEL
BUN: 11 mg/dL (ref 6–23)
CO2: 25 mEq/L (ref 19–32)
Calcium: 9.8 mg/dL (ref 8.4–10.5)
Chloride: 104 mEq/L (ref 96–112)
Creatinine, Ser: 0.72 mg/dL (ref 0.50–1.10)
Glucose, Bld: 89 mg/dL (ref 70–99)

## 2012-08-26 LAB — URINALYSIS, ROUTINE W REFLEX MICROSCOPIC
Glucose, UA: NEGATIVE mg/dL
Hgb urine dipstick: NEGATIVE
Ketones, ur: 15 mg/dL — AB
pH: 7 (ref 5.0–8.0)

## 2012-08-26 LAB — URINE MICROSCOPIC-ADD ON

## 2012-08-26 LAB — TROPONIN I: Troponin I: <0.3 ng/mL (ref <0.30)

## 2012-08-26 LAB — TSH: TSH: 1.266 u[IU]/mL (ref 0.350–4.500)

## 2012-08-26 LAB — MAGNESIUM: Magnesium: 2.1 mg/dL (ref 1.5–2.5)

## 2012-08-26 LAB — PHOSPHORUS: Phosphorus: 3.5 mg/dL (ref 2.3–4.6)

## 2012-08-26 MED ORDER — POTASSIUM CHLORIDE ER 10 MEQ PO CPCR: 20.0000 meq | ORAL_CAPSULE | Freq: Every day | ORAL | Status: DC

## 2012-08-26 MED ORDER — HYDRALAZINE HCL 20 MG/ML IJ SOLN: 10.0000 mg | INTRAMUSCULAR | Status: DC | PRN

## 2012-08-26 MED ORDER — ONDANSETRON HCL 4 MG/2ML IJ SOLN: 4.0000 mg | Freq: Four times a day (QID) | INTRAMUSCULAR | Status: DC | PRN

## 2012-08-26 MED ORDER — IRBESARTAN 300 MG PO TABS
300.0000 mg | ORAL_TABLET | Freq: Every day | ORAL | Status: DC
  Administered 2012-08-26: 300 mg via ORAL
  Filled 2012-08-26: qty 1

## 2012-08-26 MED ORDER — SODIUM CHLORIDE 0.9 % IV SOLN: 250.0000 mL | INTRAVENOUS | Status: DC | PRN

## 2012-08-26 MED ORDER — SODIUM CHLORIDE 0.9 % IJ SOLN: 3.0000 mL | Freq: Two times a day (BID) | INTRAMUSCULAR | Status: DC

## 2012-08-26 MED ORDER — HYDRALAZINE HCL 50 MG PO TABS: 50.0000 mg | ORAL_TABLET | Freq: Four times a day (QID) | ORAL | Status: DC

## 2012-08-26 MED ORDER — FUROSEMIDE 40 MG PO TABS: 40.0000 mg | ORAL_TABLET | Freq: Every day | ORAL | Status: DC

## 2012-08-26 MED ORDER — SODIUM CHLORIDE 0.9 % IJ SOLN: 3.0000 mL | INTRAMUSCULAR | Status: DC | PRN

## 2012-08-26 MED ORDER — GABAPENTIN 100 MG PO CAPS
100.0000 mg | ORAL_CAPSULE | Freq: Every day | ORAL | Status: DC
  Filled 2012-08-26: qty 1

## 2012-08-26 MED ORDER — ONDANSETRON HCL 4 MG/2ML IJ SOLN
4.0000 mg | Freq: Once | INTRAMUSCULAR | Status: AC
  Administered 2012-08-26: 4 mg via INTRAVENOUS
  Filled 2012-08-26: qty 2

## 2012-08-26 MED ORDER — LABETALOL HCL 200 MG PO TABS
200.0000 mg | ORAL_TABLET | Freq: Two times a day (BID) | ORAL | Status: DC
  Administered 2012-08-26: 200 mg via ORAL
  Filled 2012-08-26 (×2): qty 1

## 2012-08-26 MED ORDER — HYDRALAZINE HCL 50 MG PO TABS
50.0000 mg | ORAL_TABLET | Freq: Four times a day (QID) | ORAL | Status: DC
  Administered 2012-08-26: 50 mg via ORAL
  Filled 2012-08-26 (×4): qty 1

## 2012-08-26 MED ORDER — ACETAMINOPHEN 325 MG PO TABS: 650.0000 mg | ORAL_TABLET | Freq: Four times a day (QID) | ORAL | Status: DC | PRN

## 2012-08-26 MED ORDER — SODIUM CHLORIDE 0.9 % IJ SOLN
3.0000 mL | Freq: Two times a day (BID) | INTRAMUSCULAR | Status: DC
Start: 2012-08-26 — End: 2012-08-26
  Administered 2012-08-26: 3 mL via INTRAVENOUS

## 2012-08-26 MED ORDER — POTASSIUM CHLORIDE CRYS ER 20 MEQ PO TBCR
20.0000 meq | EXTENDED_RELEASE_TABLET | Freq: Every day | ORAL | Status: DC
  Administered 2012-08-26: 20 meq via ORAL
  Filled 2012-08-26: qty 1

## 2012-08-26 MED ORDER — HYDRALAZINE HCL 25 MG PO TABS
25.0000 mg | ORAL_TABLET | Freq: Three times a day (TID) | ORAL | Status: DC
  Administered 2012-08-26: 25 mg via ORAL
  Filled 2012-08-26 (×4): qty 1

## 2012-08-26 MED ORDER — ALBUTEROL SULFATE HFA 108 (90 BASE) MCG/ACT IN AERS
2.0000 | INHALATION_SPRAY | RESPIRATORY_TRACT | Status: DC | PRN
  Filled 2012-08-26: qty 6.7

## 2012-08-26 MED ORDER — VITAMINS A & D EX OINT
TOPICAL_OINTMENT | CUTANEOUS | Status: AC
  Administered 2012-08-26: 06:00:00
  Filled 2012-08-26: qty 5

## 2012-08-26 MED ORDER — HYDROCODONE-ACETAMINOPHEN 5-325 MG PO TABS: 1.0000 | ORAL_TABLET | ORAL | Status: DC | PRN

## 2012-08-26 MED ORDER — ENOXAPARIN SODIUM 40 MG/0.4ML ~~LOC~~ SOLN
40.0000 mg | SUBCUTANEOUS | Status: DC
  Administered 2012-08-26: 40 mg via SUBCUTANEOUS
  Filled 2012-08-26: qty 0.4

## 2012-08-26 MED ORDER — ACETAMINOPHEN 650 MG RE SUPP: 650.0000 mg | Freq: Four times a day (QID) | RECTAL | Status: DC | PRN

## 2012-08-26 MED ORDER — LORAZEPAM 0.5 MG PO TABS
0.5000 mg | ORAL_TABLET | Freq: Four times a day (QID) | ORAL | Status: DC | PRN
  Administered 2012-08-26: 0.5 mg via ORAL
  Filled 2012-08-26: qty 1

## 2012-08-26 MED ORDER — OLMESARTAN MEDOXOMIL 40 MG PO TABS: ORAL_TABLET | ORAL | Status: DC

## 2012-08-26 MED ORDER — ONDANSETRON HCL 4 MG PO TABS: 4.0000 mg | ORAL_TABLET | Freq: Four times a day (QID) | ORAL | Status: DC | PRN

## 2012-08-26 MED ORDER — DOCUSATE SODIUM 100 MG PO CAPS
100.0000 mg | ORAL_CAPSULE | Freq: Two times a day (BID) | ORAL | Status: DC
  Administered 2012-08-26: 100 mg via ORAL
  Filled 2012-08-26 (×2): qty 1

## 2012-08-26 MED ORDER — SERTRALINE HCL 25 MG PO TABS
25.0000 mg | ORAL_TABLET | Freq: Every day | ORAL | Status: DC
Start: 2012-08-26 — End: 2012-08-26
  Administered 2012-08-26: 25 mg via ORAL
  Filled 2012-08-26: qty 1

## 2012-08-26 MED ORDER — PANTOPRAZOLE SODIUM 20 MG PO TBEC
20.0000 mg | DELAYED_RELEASE_TABLET | Freq: Every day | ORAL | Status: DC
  Administered 2012-08-26: 20 mg via ORAL
  Filled 2012-08-26: qty 1

## 2012-08-26 MED ORDER — SIMVASTATIN 40 MG PO TABS
40.0000 mg | ORAL_TABLET | Freq: Every day | ORAL | Status: DC
  Filled 2012-08-26: qty 1

## 2012-08-26 MED ORDER — HYDROCHLOROTHIAZIDE 25 MG PO TABS
25.0000 mg | ORAL_TABLET | Freq: Every day | ORAL | Status: DC
  Administered 2012-08-26: 25 mg via ORAL
  Filled 2012-08-26: qty 1

## 2012-08-26 NOTE — Progress Notes (Signed)
  Echocardiogram 2D Echocardiogram has been performed.  Stacy Page 08/26/2012, 11:09 AM

## 2012-08-26 NOTE — Discharge Summary (Signed)
Physician Discharge Summary  Stacy Page WUJ:[id number] DOB: [birth year] DOA: 08/25/2012  PCP: Marga Melnick, MD  Admit date: 08/25/2012 Discharge date: 08/26/2012    Recommendations for Outpatient Follow-up:  1. Followup blood pressure with new adjustment in medications -  Discharge Diagnoses:  Active Problems:  HYPERLIPIDEMIA  HYPERTENSION, ESSENTIAL NOS  BRADYCARDIA  Hypertensive urgency   Discharge Condition: Good  Diet recommendation: Heart healthy  Filed Weights   08/26/12 0335  Weight: 69.4 kg (153 lb)    History of present illness:  79 year old woman referred from urgent care with severe hypertension and dizziness  Hospital Course:  HYPERTENSION, ESSENTIAL NOS - patient on labetalol, benicar, lasix , diltiazem, losartan PTA. We did hold off cozaar since she is already on benicar, also we did stop Cardizem as patient has been bradycardic down to 47 and could be symptomatic. We did add HCTZ as patient can benefit from mild diuretic. She responded well to hydralazine iv in the ED and we initiated Hydralazine 2 5mg  TID on admission and titrated up to 50 mg QID.   Marland Kitchen BRADYCARDIA - likely medication induced, stopped Cardizem and resolved  . HYPERLIPIDEMIA - continue zocor  Lightheadedness - in the setting of hypertensive urgency, carotid Dopplers was without ICA stenosis. Symptoms resolved with control of hypertension   Procedures: Echocardiogram Carotid doppler  Consultations:  none  Discharge Exam: Filed Vitals:   08/26/12 0335 08/26/12 0341 08/26/12 0455 08/26/12 0545  BP:  171/85  131/73  Pulse:  74 74 65  Temp:  98.1 F (36.7 C)  98.5 F (36.9 C)  TempSrc:  Oral  Oral  Resp:  16  20  Height: 4\' 10"  (1.473 m)     Weight: 69.4 kg (153 lb)     SpO2:  97%  98%    General: Alert and oriented x3 Cardiovascular: Regular rate and rhythm no murmurs rubs or gallops Respiratory: Clear to auscultation bilaterally  Discharge Instructions  Discharge Orders    Future Orders Please Complete By Expires   Diet - low sodium heart healthy      Increase activity slowly          Medication List     As of 08/26/2012 11:24 AM    STOP taking these medications         diltiazem 120 MG tablet   Commonly known as: CARDIZEM      losartan 100 MG tablet   Commonly known as: COZAAR      meclizine 25 MG tablet   Commonly known as: ANTIVERT      TAKE these medications         b complex vitamins tablet   Take 1 tablet by mouth daily.      CALTRATE 600 PO   Take 600 mg by mouth. 2 by mouth once daily      co-enzyme Q-10 30 MG capsule   Take 30 mg by mouth once.      EYE SUPPORT PO   Take by mouth 2 (two) times daily.      fexofenadine 180 MG tablet   Commonly known as: ALLEGRA   Take 180 mg by mouth daily as needed.      fish oil-omega-3 fatty acids 1000 MG capsule   Take 1 g by mouth daily.      furosemide 40 MG tablet   Commonly known as: LASIX   Take 1 tablet (40 mg total) by mouth daily.      gabapentin 100 MG capsule   Commonly  known as: NEURONTIN   TAKE ONE CAPSULE BY MOUTH EVERY 8 HOURS AS NEEDED FOR LEG PAIN      Garlic 300 MG Tabs   Take 300 mg by mouth daily.      Horse Chestnut 300 MG Tabs   Take 300 mg by mouth 2 (two) times daily.      hydrALAZINE 50 MG tablet   Commonly known as: APRESOLINE   Take 1 tablet (50 mg total) by mouth every 6 (six) hours.      labetalol 200 MG tablet   Commonly known as: NORMODYNE   TAKE 1 TABLET BY MOUTH TWICE DAILY      LORazepam 0.5 MG tablet   Commonly known as: ATIVAN   TAKE 1 TABLET BY MOUTH EVERY 8 TO 12 HOURS AS NEEDED . DO NOT TAKE ROUTINELY      olmesartan 40 MG tablet   Commonly known as: BENICAR   TAKE 1 TABLET BY MOUTH EVERY DAY      pantoprazole 20 MG tablet   Commonly known as: PROTONIX   Take 20 mg by mouth daily.      potassium chloride 10 MEQ CR capsule   Commonly known as: MICRO-K   Take 2 capsules (20 mEq total) by mouth daily.      PROAIR HFA 108 (90  BASE) MCG/ACT inhaler   Generic drug: albuterol   USE 1 TO 2 PUFFS BY MOUTH EVERY 4 HOURS AS NEEDED      sertraline 25 MG tablet   Commonly known as: ZOLOFT   Take 1 tablet (25 mg total) by mouth daily.      simvastatin 40 MG tablet   Commonly known as: ZOCOR   TAKE 1 TABLET BY MOUTH EVERY NIGHT AT BEDTIME      vitamin C 500 MG tablet   Commonly known as: ASCORBIC ACID   Take 500 mg by mouth daily.      Vitamin D3 2000 UNITS Tabs   Take 2,000 Units by mouth daily.           Follow-up Information    Schedule an appointment as soon as possible for a visit with Marga Melnick, MD.   Contact information:   [phone] [facility] [address] [place] [address] [phone]           The results of significant diagnostics from this hospitalization (including imaging, microbiology, ancillary and laboratory) are listed below for reference.    Significant Diagnostic Studies: Ct Head Wo Contrast  08/25/2012  *RADIOLOGY REPORT*  Clinical Data: Dizziness.  Hypertension.  CT HEAD WITHOUT CONTRAST  Technique:  Contiguous axial images were obtained from the base of the skull through the vertex without contrast.  Comparison: Report dated 01/22/2003.  Findings: Extensive patchy white matter low density in both cerebral hemispheres.  Mildly enlarged ventricles and subarachnoid spaces.  No intracranial hemorrhage, mass lesion or CT evidence of acute infarction.  Bilateral hyperostosis frontalis.  The included paranasal sinuses are clear.  IMPRESSION:  1.  No acute abnormality. 2.  Extensive chronic small vessel white matter ischemic changes in both cerebral hemispheres. 3.  Mild atrophy.   Original Report Authenticated By: Beckie Salts, M.D.    Dg Chest Port 1 View  08/25/2012  *RADIOLOGY REPORT*  Clinical Data: Uncontrolled hypertension.  PORTABLE CHEST - 1 VIEW  Comparison: None.  Findings: Borderline enlarged cardiac silhouette.  Mildly prominent pulmonary vasculature.  Clear lungs.   Unremarkable bones.  IMPRESSION: Borderline cardiomegaly and mild pulmonary  vascular congestion.   Original Report Authenticated By: Beckie Salts, M.D.     Microbiology: No results found for this or any previous visit (from the past 240 hour(s)).   Labs: Basic Metabolic Panel:  Lab 08/26/12 4540 08/25/12 2348  NA 140 140  K 3.3* 3.4*  CL 104 104  CO2 24 25  GLUCOSE 90 89  BUN 11 11  CREATININE 0.73 0.72  CALCIUM 9.5 9.8  MG 2.1 --  PHOS 3.5 --   Liver Function Tests:  Lab 08/26/12 0549  AST 18  ALT 15  ALKPHOS 125*  BILITOT 0.4  PROT 7.2  ALBUMIN 3.7   No results found for this basename: LIPASE:5,AMYLASE:5 in the last 168 hours No results found for this basename: AMMONIA:5 in the last 168 hours CBC:  Lab 08/26/12 0549 08/25/12 2348  WBC 5.6 4.9  NEUTROABS -- 2.9  HGB 13.2 14.2  HCT 39.3 42.3  MCV 84.5 86.2  PLT 238 221   Cardiac Enzymes:  Lab 08/26/12 0549 08/25/12 2348  CKTOTAL -- --  CKMB -- --  CKMBINDEX -- --  TROPONINI <0.30 <0.30   BNP: BNP (last 3 results) No results found for this basename: PROBNP:3 in the last 8760 hours CBG: No results found for this basename: GLUCAP:5 in the last 168 hours     Signed:  Seville Brick  [facility] 08/26/2012, 11:24 AM

## 2012-08-26 NOTE — H&P (Signed)
PCP:  Marga Melnick, MD    Chief Complaint:   Elevated blood pressure  HPI: Stacy Page is a 79 y.o. female   has a past medical history of Hypertension; Hyperlipidemia; Allergy; Asthma; and GERD (gastroesophageal reflux disease).   Presented with  Elevated blood pressure from clinic today. A week ago she had a cut to her hand and today she was getting her stiches out but her blood pressure was noted to be in 200's.  She states that 2 weeks ago she was seeing chiropractor and was sent to her PCP office due to severe HTN. Her PCP added a second ARB agent.  She has been feeling more than usual lightheaded for about a week but today she felt worse and presented to ER. She denies any current vertigo but states she has remote hx of this. NO slurred speech no neurological complains. She has a mild headache. Her blood pressure.    Review of Systems:     Pertinent positives include: dizziness, headaches   Constitutional:  No weight loss, night sweats, Fevers, chills, fatigue, weight loss  HEENT:  No  Difficulty swallowing,Tooth/dental problems,Sore throat,  No sneezing, itching, ear ache, nasal congestion, post nasal drip Cardio-vascular:  No chest pain, Orthopnea, PND, anasarca, palpitations.no Bilateral lower extremity swelling  GI:  No heartburn, indigestion, abdominal pain, nausea, vomiting, diarrhea, change in bowel habits, loss of appetite, melena, blood in stool, hematemesis Resp:  no shortness of breath at rest. No dyspnea on exertion, No excess mucus, no productive cough, No non-productive cough, No coughing up of blood.No change in color of mucus.No wheezing. Skin:  no rash or lesions. No jaundice GU:  no dysuria, change in color of urine, no urgency or frequency. No straining to urinate.  No flank pain.  Musculoskeletal:  No joint pain or no joint swelling. No decreased range of motion. No back pain.  Psych:  No change in mood or affect. No depression or anxiety. No  memory loss.  Neuro: no localizing neurological complaints, no tingling, no weakness, no double vision, no gait abnormality, no slurred speech, no confusion  Otherwise ROS are negative except for above, 10 systems were reviewed  Past Medical History: Past Medical History  Diagnosis Date  . Hypertension   . Hyperlipidemia   . Allergy     seasonal  . Asthma   . GERD (gastroesophageal reflux disease)    Past Surgical History  Procedure Date  . Bunionectomy   . Carpal tunnel release     Bilaterally  . Abdominal hysterectomy     for fibroids (no BSO)  . Macular degeneration     Dr Luciana Axe  ; injections     Medications: Prior to Admission medications   Medication Sig Start Date End Date Taking? Authorizing Provider  Ascorbic Acid (VITAMIN C) 500 MG tablet Take 500 mg by mouth daily.     Yes Historical Provider, MD  b complex vitamins tablet Take 1 tablet by mouth daily.    Yes Historical Provider, MD  Calcium Carbonate (CALTRATE 600 PO) Take 600 mg by mouth. 2 by mouth once daily    Yes Historical Provider, MD  Cholecalciferol (VITAMIN D3) 2000 UNITS TABS Take 2,000 Units by mouth daily.     Yes Historical Provider, MD  co-enzyme Q-10 30 MG capsule Take 30 mg by mouth once.     Yes Historical Provider, MD  diltiazem (CARDIZEM) 120 MG tablet TAKE 1 TABLET BY MOUTH TWICE DAILY 02/27/11  Yes Pecola Lawless, MD  fexofenadine (ALLEGRA) 180 MG tablet Take 180 mg by mouth daily as needed.     Yes Historical Provider, MD  fish oil-omega-3 fatty acids 1000 MG capsule Take 1 g by mouth daily.     Yes Historical Provider, MD  furosemide (LASIX) 40 MG tablet TAKE 1 TABLET BY MOUTH ONCE DAILY AS NEEDED FOR EDEMA 02/19/12  Yes Pecola Lawless, MD  gabapentin (NEURONTIN) 100 MG capsule TAKE ONE CAPSULE BY MOUTH EVERY 8 HOURS AS NEEDED FOR LEG PAIN 08/02/12  Yes Pecola Lawless, MD  Garlic 300 MG TABS Take 300 mg by mouth daily.     Yes Historical Provider, MD  [place] 300 MG TABS Take 300  mg by mouth 2 (two) times daily.    Yes Historical Provider, MD  labetalol (NORMODYNE) 200 MG tablet TAKE 1 TABLET BY MOUTH TWICE DAILY 06/14/12  Yes Pecola Lawless, MD  LORazepam (ATIVAN) 0.5 MG tablet TAKE 1 TABLET BY MOUTH EVERY 8 TO 12 HOURS AS NEEDED . DO NOT TAKE ROUTINELY 01/18/12  Yes Pecola Lawless, MD  losartan (COZAAR) 100 MG tablet Take 1 tablet (100 mg total) by mouth daily. 07/26/12  Yes Pecola Lawless, MD  meclizine (ANTIVERT) 25 MG tablet TAKE 1-2 TABLETS BY MOUTH TWICE DAILY AS NEEDED 08/10/12  Yes Pecola Lawless, MD  Multiple Vitamins-Minerals (EYE SUPPORT PO) Take by mouth 2 (two) times daily.   Yes Historical Provider, MD  olmesartan (BENICAR) 40 MG tablet TAKE 1 TABLET BY MOUTH EVERY DAY 06/27/12  Yes Pecola Lawless, MD  pantoprazole (PROTONIX) 20 MG tablet Take 20 mg by mouth daily.    Yes Historical Provider, MD  potassium chloride (MICRO-K) 10 MEQ CR capsule TAKE ONE CAPSULE BY MOUTH DAILY IF LASIX (FUROSEMIDE) IS TAKEN 03/09/12  Yes Pecola Lawless, MD  PROAIR HFA 108 [id number] BASE) MCG/ACT inhaler USE 1 TO 2 PUFFS BY MOUTH EVERY 4 HOURS AS NEEDED 08/28/11  Yes Pecola Lawless, MD  sertraline (ZOLOFT) 25 MG tablet Take 1 tablet (25 mg total) by mouth daily. 04/01/11 08/23/97 Yes Pecola Lawless, MD  simvastatin (ZOCOR) 40 MG tablet TAKE 1 TABLET BY MOUTH EVERY NIGHT AT BEDTIME 02/23/12  Yes Pecola Lawless, MD    Allergies:   Allergies  Allergen Reactions  . Aspirin     gastritis  . Metoclopramide Hcl Other (See Comments)    UNKNOWN  . Spironolactone     ? reaction  . Amlodipine Besylate     REACTION: edema    Social History:  Ambulatory   Independently  Lives at  Home with grand-daughter   reports that she quit smoking about 33 years ago. She does not have any smokeless tobacco history on file. She reports that she does not drink alcohol or use illicit drugs.   Family History: family history includes Alzheimer's disease in her mother; Diabetes in an  unspecified family member; Heart attack in an unspecified family member; Heart attack (age of onset:73) in her maternal grandmother; Hypertension in her father, maternal grandmother, and mother; and Stroke in her mother.    Physical Exam: Patient Vitals for the past 24 hrs:  BP Temp Temp src Pulse Resp SpO2  08/26/12 0115 149/76 mmHg - - 59  18  100 %  08/26/12 0100 138/70 mmHg - - 58  15  98 %  08/26/12 0045 122/80 mmHg - - 63  17  100 %  08/26/12 0030 136/77 mmHg - - 62  14  100 %  08/26/12 0015 181/70 mmHg - - 60  15  100 %  08/26/12 0000 - - - 55  13  100 %  08/25/12 2345 182/74 mmHg - - 49  22  100 %  08/25/12 2330 198/74 mmHg - - 48  15  99 %  08/25/12 2315 199/85 mmHg - - 47  19  100 %  08/25/12 2300 194/83 mmHg - - 47  13  99 %  08/25/12 2245 199/85 mmHg - - 51  20  99 %  08/25/12 2230 - - - 54  17  100 %  08/25/12 2215 - - - 55  17  99 %  08/25/12 2145 207/82 mmHg - - 49  14  99 %  08/25/12 2130 202/85 mmHg - - 59  19  98 %  08/25/12 2123 215/95 mmHg - - 62  14  96 %  08/25/12 2056 209/101 mmHg 98.3 F (36.8 C) Oral 54  18  99 %    1. General:  in No Acute distress 2. Psychological: Alert and  Oriented 3. Head/ENT:   Moist   Mucous Membranes                          Head Non traumatic, neck supple                          Normal   Dentition 4. SKIN: normal   Skin turgor,  Skin clean Dry and intact no rash 5. Heart: Regular rate and rhythm 2/6 systolic Murmur no Rub or gallop, bilateral carotid bruit noted.  6. Lungs: Clear to auscultation bilaterally, no wheezes or crackles   7. Abdomen: Soft, non-tender, Non distended 8. Lower extremities: no clubbing, cyanosis, or edema, slightly obese 9. Neurologically strength 5/5 in all 4 extremities. CN 2-12 intact.  10. MSK: Normal range of motion  body mass index is unknown because there is no height or weight on file.   Labs on Admission:   [facility] 08/25/12 2348  NA 140  K 3.4*  CL 104  CO2 25  GLUCOSE 89  BUN 11    CREATININE 0.72  CALCIUM 9.8  MG --  PHOS --   No results found for this basename: AST:2,ALT:2,ALKPHOS:2,BILITOT:2,PROT:2,ALBUMIN:2 in the last 72 hours No results found for this basename: LIPASE:2,AMYLASE:2 in the last 72 hours  Basename 08/25/12 2348  WBC 4.9  NEUTROABS 2.9  HGB 14.2  HCT 42.3  MCV 86.2  PLT 221    Basename 08/25/12 2348  CKTOTAL --  CKMB --  CKMBINDEX --  TROPONINI <0.30   No results found for this basename: TSH,T4TOTAL,FREET3,T3FREE,THYROIDAB in the last 72 hours No results found for this basename: VITAMINB12:2,FOLATE:2,FERRITIN:2,TIBC:2,IRON:2,RETICCTPCT:2 in the last 72 hours No results found for this basename: HGBA1C    The CrCl is unknown because both a height and weight (above a minimum accepted value) are required for this calculation. ABG No results found for this basename: phart, pco2, po2, hco3, tco2, acidbasedef, o2sat     No results found for this basename: DDIMER     Other results:  I have pearsonaly reviewed this: ECG REPORT  Rate: 53  Rhythm: sinus bradycardia ST&T Change: ST flattening in lateral and anterior leads   Cultures: No results found for this basename: sdes, specrequest, cult, reptstatus       Radiological Exams on Admission: Ct Head Wo Contrast  08/25/2012  *RADIOLOGY REPORT*  Clinical Data: Dizziness.  Hypertension.  CT HEAD WITHOUT CONTRAST  Technique:  Contiguous axial images were obtained from the base of the skull through the vertex without contrast.  Comparison: Report dated 01/22/2003.  Findings: Extensive patchy white matter low density in both cerebral hemispheres.  Mildly enlarged ventricles and subarachnoid spaces.  No intracranial hemorrhage, mass lesion or CT evidence of acute infarction.  Bilateral hyperostosis frontalis.  The included paranasal sinuses are clear.  IMPRESSION:  1.  No acute abnormality. 2.  Extensive chronic small vessel white matter ischemic changes in both cerebral hemispheres. 3.   Mild atrophy.   Original Report Authenticated By: Beckie Salts, M.D.    Dg Chest Port 1 View  08/25/2012  *RADIOLOGY REPORT*  Clinical Data: Uncontrolled hypertension.  PORTABLE CHEST - 1 VIEW  Comparison: None.  Findings: Borderline enlarged cardiac silhouette.  Mildly prominent pulmonary vasculature.  Clear lungs.  Unremarkable bones.  IMPRESSION: Borderline cardiomegaly and mild pulmonary vascular congestion.   Original Report Authenticated By: Beckie Salts, M.D.     Chart has been reviewed  Assessment/Plan  79 yo F with poorly controlled HTN here with Hypertensive urgency and lightheadedness.   Present on Admission:  . HYPERTENSION, ESSENTIAL NOS - Will hold off cozaar since she is already on benicar, also hold Cardizem as patient has been bradycardic down to 47 and could be symptomatic. Will put holding parameters on labetalol and once HR stabilizes can titrate up if able. Will add HCTZ as patient can benefit from mild diuretic. Since she responded well to hydralazine will initiate at 2 5mg  TID and this could be titrate up as able. Will obtain renal duplex to evaluate for renal artery stenosis given accelerated difficult to control HTN.   Cherene Julian - likely medication induced, will hold Cardizem and have holding parameters for  labetalol . HYPERLIPIDEMIA - continue zocor . Hypertensive urgency - currently BP under better control down to 140's/90's will admit to tele, cycle cardiac enzymes. Lightheadedness - in the setting of hypertensive urgency, but will obtain 2 D echo given cardiomegaly findings and carotid dopplers given bruit.    Prophylaxis:   Lovenox, Protonix  CODE STATUS: FULL CODE  Other plan as per orders.  I have spent a total of 55 min on this admission  Broadus Costilla 08/26/2012, 2:22 AM

## 2012-08-26 NOTE — Progress Notes (Signed)
Pt stable, scripts, d/c instructions given with no questions/concerns voiced.  Pt transported to private vehicle via wheelchair with NT and female family member.

## 2012-08-26 NOTE — ED Notes (Signed)
Pt feeling nauseated - Notified Topher, RN

## 2012-08-26 NOTE — Progress Notes (Signed)
VASCULAR LAB PRELIMINARY  PRELIMINARY  PRELIMINARY  PRELIMINARY  Carotid Dopplers completed.    Preliminary report:  There is no ICA stenosis.  Vertebral artery flow is antegrade.  Ryllie Nieland, RVT 08/26/2012, 9:10 AM

## 2012-08-26 NOTE — Progress Notes (Signed)
Spoke with Selena Batten, RN in regards to Renal Artery Duplex and let her know patient needed to be NPO after midnight and the tech would be here the morning of 08/27/12 to perform test.

## 2012-08-28 ENCOUNTER — Telehealth: Payer: Self-pay | Admitting: Internal Medicine

## 2012-08-28 NOTE — Telephone Encounter (Signed)
Spoke with patient and gave Dr.Hoppers instructions.  Patient currently taking 1/2 of 50 mg tab 6 x daily. Patient states she has to wake up in the middle of the night to take. Patient would like to verify that she is to take 6 x daily

## 2012-08-28 NOTE — Telephone Encounter (Signed)
OK but continue BP monitor.Blood Pressure Goal = AVERAGE < 140/90; Ideal is an AVERAGE < 135/85. This AVERAGE should be calculated from @ least 5-7 BP readings taken @ different times of day on different days of week. You should not respond to isolated BP readings , but rather the AVERAGE for that week

## 2012-08-28 NOTE — Telephone Encounter (Signed)
Hopp please advise  

## 2012-08-28 NOTE — Telephone Encounter (Signed)
Patient states she was given hydralazine 50 mg in the hospital and when she took that it brought her BP down to 111/25. She states she started taking 1/2 a pill and it works better. Patient would like to know if that is ok.

## 2012-08-30 ENCOUNTER — Encounter: Payer: Self-pay | Admitting: Lab

## 2012-08-30 ENCOUNTER — Ambulatory Visit (INDEPENDENT_AMBULATORY_CARE_PROVIDER_SITE_OTHER): Payer: Medicare Other | Admitting: Internal Medicine

## 2012-08-30 ENCOUNTER — Encounter: Payer: Self-pay | Admitting: Internal Medicine

## 2012-08-30 VITALS — BP 146/92 | HR 68 | Temp 98.3°F | Wt 161.4 lb

## 2012-08-30 DIAGNOSIS — I498 Other specified cardiac arrhythmias: Secondary | ICD-10-CM

## 2012-08-30 DIAGNOSIS — I160 Hypertensive urgency: Secondary | ICD-10-CM

## 2012-08-30 DIAGNOSIS — J45909 Unspecified asthma, uncomplicated: Secondary | ICD-10-CM

## 2012-08-30 DIAGNOSIS — I10 Essential (primary) hypertension: Secondary | ICD-10-CM

## 2012-08-30 LAB — POTASSIUM: Potassium: 3.6 mEq/L (ref 3.5–5.1)

## 2012-08-30 LAB — BUN: BUN: 9 mg/dL (ref 6–23)

## 2012-08-30 NOTE — Telephone Encounter (Signed)
Refill: Bystolic 5mg  tablets. Take one tablet by mouth once daily. Qty 30. Last fill 08-30-12

## 2012-08-30 NOTE — Patient Instructions (Addendum)
Blood Pressure Goal =AVERAGE < 140/90, Ideal is an AVERAGE < 135/85. This AVERAGE should be calculated from @ least 5-7 BP readings taken @ different times of day on different days of week. You should not respond to isolated BP readings , but rather the AVERAGE for that week   If you activate My Chart; the results can be released to you as soon as they populate from the lab. If you choose not to use this program; the labs have to be reviewed, copied & mailed   causing a delay in getting the results to you.   Check on cost of Bystolic under your plan

## 2012-08-30 NOTE — Progress Notes (Signed)
  Subjective:    Patient ID: Stacy Page, female    DOB: 09/04/1932, 79 y.o.   MRN: [id number]  HPI  Hospital records 1/3-4/14 reviewed. Diagnosis was hypertensive urgency complicated by bradycardia do to Cardizem. The rate controlling calcium channel blocker was discontinued and she was titrated to hydralazine 50 mg 4 times a day.  Renal function is excellent. Potassium was mildly reduced at 3.3.  On this regimen her blood pressure dropped as low as 113/25; she decrease hydralazine to 25 mg 4 times a day.BP up to 187/68        Review of Systems She denies chest pain, palpitations, dysrhythmias, claudication, or paroxysmal nocturnal dyspnea. She does have slight pedal edema. Any dyspnea is related to her asthma; control of asthma appears to be good at this time with ProAir up to twice a day.     Objective:   Physical Exam General appearance is one of good health and nourishment w/o distress.Appears younger than stated age   Eyes: No conjunctival inflammation or scleral icterus is present.  Oral exam: Upper plate; lips and gums are healthy appearing.There is no oropharyngeal erythema or exudate noted.   Heart:  Normal rate and regular rhythm. S1 and S2 normal without gallop,  click, rub or other extra sounds. Grade 1/6 systolic murmur      Lungs:Chest clear to auscultation; no wheezes, rhonchi,rales ,or rubs present.No increased work of breathing.   Abdomen: bowel sounds normal, soft and non-tender without masses, organomegaly or hernias noted.  No guarding or rebound . No AAA or renal artery bruits. No HJR  Skin:Warm & dry.  Intact without suspicious lesions or rashes ; no tenting  Lymphatic: No lymphadenopathy is noted about the head, neck, axilla   All pulses intact without  bruits .No ischemic skin changes.    Extremities:  No cyanosis or deformity noted . Support stockings worn             Assessment & Plan:  #1 hypertension; hydralazine should be increased to  50 mg 4 times a day if the average is greater than 160/90. If blood pressure remains elevated; evaluation for renal artery stenosis should be pursued.  #2 hypokalemia on furosemide 40 g daily  #3 asthma; if symptoms exacerbate, labetalol should be changed to a selective beta blocker such as Bystolic.

## 2012-08-31 NOTE — Progress Notes (Signed)
Below history and physical exam reviewed in detail with Eula Listen, PA-C.  Agree with current assessment and plan.  KMS

## 2012-08-31 NOTE — Telephone Encounter (Signed)
Hopp please advise, patient was seen yesterday and Bystolic is not on medication list

## 2012-08-31 NOTE — Telephone Encounter (Signed)
Decrease labetalol to once a day in the morning. That evening start the systolic 5 mg. Continue only the systolic 5 mg daily for one week. Increase it to 1 twice a day after one week if blood pressure is staying over 140/90. Please pick up samples (42 pills) to use until followup visit. We'll titrate this medicine as needed. It will replace the labetalol. It should be more effective & better in the presence of any lung issues.

## 2012-09-01 ENCOUNTER — Encounter: Payer: Self-pay | Admitting: *Deleted

## 2012-09-01 NOTE — Progress Notes (Signed)
Reviewed and agree.

## 2012-09-01 NOTE — Telephone Encounter (Signed)
Spoke with patient, patient aware of instructions. Patient will stop by to pick-up samples

## 2012-09-04 LAB — ALDOSTERONE + RENIN ACTIVITY W/ RATIO: ALDO / PRA Ratio: 27.3 Ratio (ref 0.9–28.9)

## 2012-09-05 ENCOUNTER — Encounter: Payer: Self-pay | Admitting: *Deleted

## 2012-09-29 ENCOUNTER — Telehealth: Payer: Self-pay | Admitting: Internal Medicine

## 2012-09-29 MED ORDER — HYDRALAZINE HCL 50 MG PO TABS: ORAL_TABLET | ORAL | Status: DC

## 2012-09-29 NOTE — Telephone Encounter (Signed)
Refill: Hydralazine 50mg  tablets. Take 1 tablet by mouth every 6 hours. Qty 120. Last fill 08-26-12

## 2012-10-06 ENCOUNTER — Ambulatory Visit: Payer: Medicare Other | Admitting: Internal Medicine

## 2012-10-12 ENCOUNTER — Encounter: Payer: Self-pay | Admitting: Internal Medicine

## 2012-10-12 ENCOUNTER — Ambulatory Visit (INDEPENDENT_AMBULATORY_CARE_PROVIDER_SITE_OTHER): Payer: Medicare Other | Admitting: Internal Medicine

## 2012-10-12 ENCOUNTER — Ambulatory Visit: Payer: Medicare Other | Admitting: Internal Medicine

## 2012-10-12 VITALS — BP 134/76 | HR 67 | Temp 98.1°F | Wt 157.8 lb

## 2012-10-12 DIAGNOSIS — I10 Essential (primary) hypertension: Secondary | ICD-10-CM

## 2012-10-12 MED ORDER — NEBIVOLOL HCL 5 MG PO TABS: 5.0000 mg | ORAL_TABLET | Freq: Every day | ORAL | Status: DC

## 2012-10-12 MED ORDER — HYDRALAZINE HCL 50 MG PO TABS: 50.0000 mg | ORAL_TABLET | Freq: Four times a day (QID) | ORAL | Status: DC

## 2012-10-12 NOTE — Progress Notes (Signed)
  Subjective:    Patient ID: Stacy Page, female    DOB: 24-Nov-1932, 79 y.o.   MRN: [id number]  HPI CHRONIC HYPERTENSION follow-up:  Home blood pressure range OFF Labetalol & Diltiazem on Bystolic 128-140/70  Patient is compliant with medications  No adverse effects noted from medication  Exercise program as walking & machines 3-4  times per week for 60 minutes  On low-salt diet             Review of Systems No chest pain, palpitations, dyspnea, claudication, paroxysmal nocturnal dyspnea described. Edema stable with med changes  Intermittent   Lightheadedness. No significant headache, epistaxis, or syncope     Objective:   Physical Exam  Appears healthy and well-nourished & in no acute distress.Appears younger than stated age  No carotid bruits are present.No neck pain distention present at 10 - 15 degrees. Thyroid normal to palpation  Heart rhythm and rate are normal ; grade 1/2 R base murmurs ; no gallops.  Chest is clear with no increased work of breathing  There is no evidence of aortic aneurysm or renal artery bruits  Abdomen soft with no organomegaly or masses. No HJR  No clubbing, cyanosis  Present.Edema controlled with support hose  Pedal pulses are intact but decreased  No ischemic skin changes are present . Nails healthy   Alert and oriented. Strength, tone normal       Assessment & Plan:

## 2012-10-12 NOTE — Assessment & Plan Note (Signed)
Bystolic titration based on BP average

## 2012-10-12 NOTE — Patient Instructions (Addendum)
Start with Bystolic 1/2 pill daily ; increase to 1 daily if BP AVERAGES > 140/90

## 2012-10-24 ENCOUNTER — Other Ambulatory Visit: Payer: Self-pay | Admitting: Internal Medicine

## 2012-10-25 NOTE — Telephone Encounter (Signed)
Patient aware Controlled Substance Contract to be sign and rx to be picked up   

## 2012-10-26 ENCOUNTER — Encounter: Payer: Self-pay | Admitting: Internal Medicine

## 2012-10-26 ENCOUNTER — Ambulatory Visit (INDEPENDENT_AMBULATORY_CARE_PROVIDER_SITE_OTHER): Payer: Medicare Other | Admitting: Internal Medicine

## 2012-10-26 VITALS — BP 130/82 | HR 50 | Wt 158.6 lb

## 2012-10-26 DIAGNOSIS — R51 Headache: Secondary | ICD-10-CM

## 2012-10-26 DIAGNOSIS — R519 Headache, unspecified: Secondary | ICD-10-CM

## 2012-10-26 MED ORDER — FLUTICASONE PROPIONATE 50 MCG/ACT NA SUSP: 1.0000 | Freq: Two times a day (BID) | NASAL | Status: DC | PRN

## 2012-10-26 NOTE — Progress Notes (Signed)
  Subjective:    Patient ID: Stacy Page, female    DOB: 12/23/1932, 79 y.o.   MRN: [id number]  HPI Onset of headache was 10/23/12 localized to the  left  occiput without radiation . It was described as intermittent, nagging , dull pain  up to a level 4, lasting up 8 seconds. Precipitating factors were identified as seasonal environmental allergens.  Exacerbating factors such as light, sound, activity or position change not present. Headache was treated with Allegra with partial no response No associted nausea, vomiting, photophobia, photophobia, tearing ,nasal congestion or frontal   sinus pressure. No  fever ,chills, or sweats. Some L facial sinus pressure. Additionally focal weakness ,numbness, tingling ,altered mental status,or gait abnormality were NOT  present There's no past history of head trauma, loss of consciousness, seizure activity, or migraine               Review of Systems Constitutional : No fever , chills, sweats , change in weight ENT:No sinus pressure;nasal purulence; hearing loss;earache ;otic discharge. Tinnitus is chronic & unrelated to headache. Eye: No blurred vision , diplopia, vision loss Cardiac prodrome: No palpitations, irregular rhythm, heart rate change, nausea , sweating.  MS: cramp in R calf 3/4.PMH of hypokalemia.[id number] Neurologic :No numbness and tingling,limb  weakness, change in coordination (gait/falling) Psych: No significant anxiety,panic , depression     Objective:   Physical Exam Gen. appearance: Well-nourished, in no distress.Appears younger than stated age Eyes: Extraocular motion intact, field of vision normal, vision grossly decreased OS (PMH macular degeneration), no nystagmus ENT: Canals clear, tympanic membranes normal, tuning fork exam normal, hearing grossly normal. Slight tenderness left maxillary sinus pressure. No tenderness of the left mastoid Neck: Normal range of motion, no masses, normal thyroid Cardiovascular: Rate and  rhythm normal; no murmurs, gallops or extra heart sounds. Slight accentuation S2 Muscle skeletal: Tone &  strength normal Neuro:Oriented X3.No cranial nerve deficit, deep tendon  reflexes normal, gait normal. Rhomberg & finger to nose negative. Lymph: No cervical or axillary LA Skin: Warm and dry without suspicious lesions or rashes Psych: no anxiety or mood change. Normally interactive and cooperative.        Assessment & Plan:  #1 left occipital headache; no visual field or cranial nerve deficit  #2 left maxillary pressure without suggestion of acute rhinosinusitis  Plan: See orders and recommendations

## 2012-10-26 NOTE — Patient Instructions (Addendum)

## 2012-11-02 ENCOUNTER — Other Ambulatory Visit: Payer: Self-pay | Admitting: *Deleted

## 2012-11-02 DIAGNOSIS — E876 Hypokalemia: Secondary | ICD-10-CM

## 2012-11-02 MED ORDER — POTASSIUM CHLORIDE ER 10 MEQ PO CPCR: 20.0000 meq | ORAL_CAPSULE | Freq: Every day | ORAL | Status: DC

## 2012-11-02 NOTE — Telephone Encounter (Signed)
Refill for potassium sent to [facility]

## 2012-11-16 ENCOUNTER — Other Ambulatory Visit: Payer: Self-pay | Admitting: Internal Medicine

## 2012-11-24 ENCOUNTER — Telehealth: Payer: Self-pay

## 2012-11-24 NOTE — Telephone Encounter (Signed)
This can be taken one half-1 every 6-8 hours as needed. It should not be a maintenance medicine as it may impact balance and level of alertness.

## 2012-11-24 NOTE — Telephone Encounter (Signed)
Message left on VM: Patient with vertigo about 1 x weekly, patient has Meclizine at home, patient was told to stop Meclizine at hospital visit in Jan 2014. Patient would like to know if she can restart this as needed.  Hopp please advise

## 2012-11-24 NOTE — Telephone Encounter (Signed)
Spoke with patient, patient verbalized understanding of Dr.Hopper's instructions. Medication list updated to reflect meclizine 25 mg and instructions

## 2012-11-27 ENCOUNTER — Encounter: Payer: Self-pay | Admitting: Internal Medicine

## 2012-12-04 ENCOUNTER — Other Ambulatory Visit: Payer: Self-pay | Admitting: Internal Medicine

## 2012-12-05 NOTE — Telephone Encounter (Signed)
Lipid/Hep 272.4/995.20  

## 2012-12-15 ENCOUNTER — Other Ambulatory Visit: Payer: Self-pay | Admitting: Internal Medicine

## 2013-01-11 ENCOUNTER — Encounter: Payer: Self-pay | Admitting: Internal Medicine

## 2013-01-11 ENCOUNTER — Ambulatory Visit (INDEPENDENT_AMBULATORY_CARE_PROVIDER_SITE_OTHER): Payer: Medicare Other | Admitting: Internal Medicine

## 2013-01-11 VITALS — BP 140/88 | HR 52 | Temp 98.1°F | Resp 12 | Ht 58.5 in | Wt 154.0 lb

## 2013-01-11 DIAGNOSIS — E782 Mixed hyperlipidemia: Secondary | ICD-10-CM

## 2013-01-11 DIAGNOSIS — I10 Essential (primary) hypertension: Secondary | ICD-10-CM

## 2013-01-11 DIAGNOSIS — Z0000 Encounter for general adult medical examination without abnormal findings: Secondary | ICD-10-CM

## 2013-01-11 LAB — HEPATIC FUNCTION PANEL
ALT: 17 U/L (ref 0–35)
Total Protein: 7.2 g/dL (ref 6.0–8.3)

## 2013-01-11 LAB — BASIC METABOLIC PANEL
BUN: 13 mg/dL (ref 6–23)
CO2: 25 mEq/L (ref 19–32)
Chloride: 107 mEq/L (ref 96–112)
Creatinine, Ser: 0.9 mg/dL (ref 0.4–1.2)

## 2013-01-11 LAB — LIPID PANEL
Cholesterol: 173 mg/dL (ref 0–200)
Triglycerides: 60 mg/dL (ref 0.0–149.0)

## 2013-01-11 NOTE — Patient Instructions (Signed)
Minimal Blood Pressure Goal= AVERAGE < 140/90;  Ideal is an AVERAGE < 135/85. This AVERAGE should be calculated from @ least 5-7 BP readings taken @ different times of day on different days of week. You should not respond to isolated BP readings , but rather the AVERAGE for that week .Please bring your  blood pressure cuff to office visits to verify that it is reliable.It  can also be checked against the blood pressure device at the pharmacy. Finger or wrist cuffs are not dependable; an arm cuff is.  If you activate the  My Chart system; lab & Xray results will be released directly  to you as soon as I review & address these through the computer. If you choose not to sign up for My Chart within 36 hours of labs being drawn; results will be reviewed & interpretation added before being copied & mailed, causing a delay in getting the results to you.If you do not receive that report within 7-10 days ,please call. Additionally you can use this system to gain direct  access to your records  if  out of town or @ an office of a  physician who is not in  the My Chart network.  This improves continuity of care & places you in control of your medical record.  Share results with all non [facility] medical staff seen  

## 2013-01-11 NOTE — Progress Notes (Signed)
Subjective:    Patient ID: Stacy Page, female    DOB: Nov 29, 1932, 79 y.o.   MRN: [id number]  HPI Medicare Wellness Visit:  Psychosocial & medical history were reviewed as required by Medicare (abuse,antisocial behavioral risks,firearm risk).  Social history: caffeine:no  , alcohol: no  ,  tobacco use:  Quit 1982  Exercise :  Gym machines & walking 3X/ week for 1 hour No home & personal  safety / fall risk Activities of daily living: no limitations  Seatbelt  and smoke alarm employed. Power of Attorney/Living Will status : in place Ophthalmology exam current Hearing evaluation not current Orientation :oriented X 3  Memory & recall :good Spelling  testing:good Mood & affect : normal . Depression / anxiety: denied Travel history :  never  Immunization status :all current Transfusion history:  none  Preventive health surveillance:colonoscopy current; Gyn appt every 2 years. Mammograms annually Dental care:  Every 12 mos. Chart reviewed &  Updated. Active issues reviewed & addressed.      Review of Systems She had a flare of her radicular pain from lumbosacral stenosis. She had seen a neurosurgeon who recommended epidural steroid injections or surgery. She declined these therapies but is seen a chiropractor with good response.  Her home blood pressure range has been 130/70-170/85 with a wrist cuff. She did not bring the cuff for comparison today(see blood pressure of 140/88). She denies chest pain, palpitations, dyspnea, claudication, or paroxysmal nocturnal dyspnea. She does have ankle edema.      Objective:   Physical Exam Gen.: Healthy and well-nourished in appearance. Alert, appropriate and cooperative throughout exam.Appears younger than stated age  Head: Normocephalic without obvious abnormalities  Eyes: No corneal or conjunctival inflammation noted. Pupils equal round reactive to light and accommodation.  Extraocular motion intact. Vision grossly decreased in OS even  with lenses Ears: External  ear exam reveals no significant lesions or deformities. Canals clear .TMs normal. Hearing is grossly normal bilaterally. Nose: External nasal exam reveals no deformity or inflammation. Nasal mucosa are pink and moist. No lesions or exudates noted.   Mouth: Oral mucosa and oropharynx reveal no lesions or exudates. Upper plate. Neck: No deformities, masses, or tenderness noted. Range of motion & Thyroid normal. Lungs: Normal respiratory effort; chest expands symmetrically. Lungs are clear to auscultation without rales, wheezes, or increased work of breathing. Heart: Normal rate and rhythm. Normal S1 and S2. No gallop, click, or rub. Grade 1/6 systolic murmur. Abdomen: Bowel sounds normal; abdomen soft and nontender. No masses, organomegaly or hernias noted. Genitalia: As per Gyn                                  Musculoskeletal/extremities: Accentuated curvature of upper thoracic  Spine. No clubbing, cyanosis, edema, or significant extremity  deformity noted. Range of motion decreased @ knees .Tone & strength  Normal. Joints normal . Nail health good. Able to lie down & sit up w/o help. Negative SLR bilaterally Vascular: Carotid, radial artery, dorsalis pedis and  posterior tibial pulses are full and equal. No bruits present. Neurologic: Alert and oriented x3. Deep tendon reflexes symmetrical and normal.        Skin: Intact without suspicious lesions or rashes. Lymph: No cervical, axillary lymphadenopathy present. Psych: Mood and affect are normal. Normally interactive  Assessment & Plan:  #1 Medicare Wellness Exam; criteria met ; data entered #2 Problem List reviewed ; Assessment/ Recommendations made Plan: see Orders

## 2013-01-24 ENCOUNTER — Other Ambulatory Visit: Payer: Self-pay | Admitting: General Practice

## 2013-01-24 MED ORDER — FUROSEMIDE 40 MG PO TABS: ORAL_TABLET | ORAL | Status: DC

## 2013-01-24 NOTE — Telephone Encounter (Signed)
Med filled.  

## 2013-01-31 ENCOUNTER — Other Ambulatory Visit: Payer: Self-pay | Admitting: Internal Medicine

## 2013-03-13 ENCOUNTER — Encounter: Payer: Self-pay | Admitting: Internal Medicine

## 2013-04-26 ENCOUNTER — Telehealth: Payer: Self-pay

## 2013-04-26 DIAGNOSIS — J45909 Unspecified asthma, uncomplicated: Secondary | ICD-10-CM

## 2013-04-26 MED ORDER — ALBUTEROL SULFATE HFA 108 (90 BASE) MCG/ACT IN AERS: INHALATION_SPRAY | RESPIRATORY_TRACT | Status: DC

## 2013-04-26 NOTE — Telephone Encounter (Signed)
[facility] faxed Rx request for ProAir inhaler. Second request. New Rx sent via escribe.

## 2013-05-10 ENCOUNTER — Telehealth: Payer: Self-pay | Admitting: Internal Medicine

## 2013-05-10 MED ORDER — NEBIVOLOL HCL 5 MG PO TABS: 5.0000 mg | ORAL_TABLET | Freq: Every day | ORAL | Status: DC

## 2013-05-10 NOTE — Telephone Encounter (Signed)
Refill: Bystolic 5 mg tab. Take 1 tablet by mouth once daily. Qty 30. Last fill 03-07-13

## 2013-05-10 NOTE — Telephone Encounter (Signed)
rx refilled per Dr. Alwyn Ren request. Stacy Page

## 2013-05-29 ENCOUNTER — Other Ambulatory Visit: Payer: Self-pay | Admitting: *Deleted

## 2013-05-29 DIAGNOSIS — I10 Essential (primary) hypertension: Secondary | ICD-10-CM

## 2013-05-29 MED ORDER — HYDRALAZINE HCL 50 MG PO TABS: 50.0000 mg | ORAL_TABLET | Freq: Four times a day (QID) | ORAL | Status: DC

## 2013-05-29 NOTE — Telephone Encounter (Signed)
Hydralazine refill sent to pharmacy.

## 2013-06-05 ENCOUNTER — Other Ambulatory Visit: Payer: Self-pay | Admitting: Internal Medicine

## 2013-06-06 ENCOUNTER — Other Ambulatory Visit: Payer: Self-pay | Admitting: *Deleted

## 2013-06-06 ENCOUNTER — Other Ambulatory Visit: Payer: Self-pay | Admitting: Internal Medicine

## 2013-06-06 MED ORDER — FUROSEMIDE 40 MG PO TABS: ORAL_TABLET | ORAL | Status: DC

## 2013-06-06 NOTE — Telephone Encounter (Signed)
Furosemide refill sent to pharmacy

## 2013-06-07 ENCOUNTER — Other Ambulatory Visit: Payer: Self-pay | Admitting: *Deleted

## 2013-06-07 MED ORDER — GABAPENTIN 100 MG PO CAPS: ORAL_CAPSULE | ORAL | Status: DC

## 2013-06-07 NOTE — Telephone Encounter (Signed)
Please advise for Gabapentin approval. Last OV: 01/11/2013 Last refill: 12/15/2012

## 2013-06-07 NOTE — Telephone Encounter (Signed)
Refill sent to pharmacy.   

## 2013-06-07 NOTE — Telephone Encounter (Signed)
OK X1 

## 2013-06-07 NOTE — Telephone Encounter (Signed)
Gabapentin refill sent to pharmacy.

## 2013-06-08 ENCOUNTER — Other Ambulatory Visit: Payer: Self-pay | Admitting: *Deleted

## 2013-06-08 MED ORDER — PANTOPRAZOLE SODIUM 20 MG PO TBEC: 20.0000 mg | DELAYED_RELEASE_TABLET | Freq: Every day | ORAL | Status: DC

## 2013-06-08 NOTE — Telephone Encounter (Signed)
Pantoprazole refill sent to pharmcy

## 2013-07-11 ENCOUNTER — Other Ambulatory Visit: Payer: Self-pay | Admitting: Internal Medicine

## 2013-07-12 ENCOUNTER — Other Ambulatory Visit: Payer: Self-pay | Admitting: Internal Medicine

## 2013-07-13 NOTE — Telephone Encounter (Signed)
Bystolic refilled per protocol

## 2013-07-15 NOTE — Telephone Encounter (Signed)
Benicar refilled per protocol 

## 2013-09-24 ENCOUNTER — Other Ambulatory Visit: Payer: Self-pay | Admitting: Internal Medicine

## 2013-09-25 NOTE — Telephone Encounter (Signed)
Furosemide refilled per protocol. JG//CMA 

## 2013-10-29 ENCOUNTER — Other Ambulatory Visit: Payer: Self-pay | Admitting: *Deleted

## 2013-10-29 MED ORDER — FLUTICASONE PROPIONATE 50 MCG/ACT NA SUSP: 1.0000 | Freq: Two times a day (BID) | NASAL | Status: DC | PRN

## 2013-10-29 NOTE — Telephone Encounter (Signed)
Rx sent to the pharmacy by e-script.  Pt aware.//AB/CMA 

## 2013-12-14 ENCOUNTER — Other Ambulatory Visit: Payer: Self-pay | Admitting: Internal Medicine

## 2013-12-18 ENCOUNTER — Ambulatory Visit: Payer: Medicare Other | Admitting: Internal Medicine

## 2013-12-18 DIAGNOSIS — Z0289 Encounter for other administrative examinations: Secondary | ICD-10-CM

## 2013-12-20 ENCOUNTER — Other Ambulatory Visit: Payer: Self-pay | Admitting: Internal Medicine

## 2013-12-21 ENCOUNTER — Encounter: Payer: Self-pay | Admitting: Internal Medicine

## 2013-12-21 ENCOUNTER — Ambulatory Visit (INDEPENDENT_AMBULATORY_CARE_PROVIDER_SITE_OTHER): Payer: Medicare Other | Admitting: Internal Medicine

## 2013-12-21 VITALS — BP 130/90 | HR 50 | Temp 98.2°F | Resp 13 | Wt 146.4 lb

## 2013-12-21 DIAGNOSIS — G479 Sleep disorder, unspecified: Secondary | ICD-10-CM

## 2013-12-21 DIAGNOSIS — R42 Dizziness and giddiness: Secondary | ICD-10-CM

## 2013-12-21 MED ORDER — LORAZEPAM 0.5 MG PO TABS: ORAL_TABLET | ORAL | Status: DC

## 2013-12-21 MED ORDER — MECLIZINE HCL 25 MG PO TABS: ORAL_TABLET | ORAL | Status: DC

## 2013-12-21 MED ORDER — PANTOPRAZOLE SODIUM 20 MG PO TBEC: 20.0000 mg | DELAYED_RELEASE_TABLET | Freq: Every day | ORAL | Status: DC

## 2013-12-21 NOTE — Progress Notes (Signed)
Pre visit review using our clinic review tool, if applicable. No additional management support is needed unless otherwise documented below in the visit note. 

## 2013-12-21 NOTE — Patient Instructions (Signed)
To prevent sleep dysfunction follow these instructions for sleep hygiene. Do not read, watch TV, or eat in bed. Do not get into bed until you are ready to turn off the light &  to go to sleep. Do not ingest stimulants ( decongestants, diet pills, nicotine, caffeine) after the evening meal.Do not take daytime naps.Cardiovascular exercise, this can be as simple a program as walking, is recommended 30-45 minutes 3-4 times per week. If you're not exercising you should take 6-8 weeks to build up to this level. 

## 2013-12-21 NOTE — Progress Notes (Signed)
   Subjective:    Patient ID: Stacy Page, female    DOB: Mar 07, 1933, 80 y.o.   MRN: [id number]  HPI  She has experienced light-headedness for the past 2 weeks, which she describes as located over the crown.  These spells last for several seconds, however they are throughout the day.She states the room does not always spin; often it is just a sensation of light-headedness.    She cannot associate a trigger for these events such as standing or turning over in bed.  She has experienced these episodes in the past and symptoms improved with meclizine.   She is on Benicar, Bystolic, and Hydralazine for hypertension and denies hypotensive events or cardiac/ neuro prodromes.   Review of Systems Denies visual changes, excessive tearing, light sensitivity, weakness or numbness in extremities.  She denies pain in face, discolored nasal secretions, rashes or skin lesions, unusual bruising or bleeding.  ? Stools dark recently  Chronic tinnitus; no change in hearing Sleeping only for 4 hours then awake. Lorazepam had helped this     Objective:   Physical Exam General appearance:good health ;well nourished; no acute distress or increased work of breathing is present. No lymphadenopathy about the head, neck, or axilla noted. Appears younger than stated age Eyes: No conjunctival inflammation or lid edema is present. There is no scleral icterus. Arcus senilis. No nystagmus seen with head rotation during position change. FOV normal ;vision grossly normal.  Ears: External ear exam shows no significant lesions or deformities. Otoscopic examination reveals clear canals, tympanic membranes are intact bilaterally without bulging, retraction, inflammation or discharge. Tuning fork exam normal. Nose: External nasal examination shows nasal polyps. Nasal mucosa are pink and moist without lesions or exudates. No septal dislocation or deviation.No obstruction to airflow.  Oral exam: Dental hygiene is good; lips  and gums are healthy appearing.There is no oropharyngeal erythema or exudate noted.  Neck: No deformities, thyromegaly, masses, or tenderness noted. Supple with full range of motion without pain.  Heart: Slow rate and regular rhythm. S1 and S2 normal without gallop, murmur, click, rub or other extra sounds.  Lungs:Chest clear to auscultation; no wheezes, rhonchi,rales ,or rubs present. No increased work of breathing.  Extremities: No cyanosis, edema, or clubbing noted.  Neuro: CN II-XII grossly intact; oriented x 4; gait normal, including heel & toe walk; grip strength equal bilaterally; negative Rhomberg and pronator drift.  Skin: Warm & dry w/o jaundice or tenting.     Assessment & Plan:  #1 lightheadedness - labs (cbc, bmp ) if no better with meclizine  #2 sleep disorder - Lorazepam trial

## 2013-12-21 NOTE — Progress Notes (Signed)
   Subjective:    Patient ID: Stacy Page, female    DOB: Jul 27, 1933, 80 y.o.   MRN: [id number]  HPI She describes light-headedness for the past 2 weeks.  These spells last for several seconds, however they are throughout the day. She cannot associate a trigger for these events.  She has experienced these episodes in the past and symptoms improved with meclizine.  She states the room does not actually spin; she feels light-headedness.   She is on Benicar, Bystolic, and Hydralazine for hypertension and denies hypotensive events or cardiac prodrome.   Review of Systems Denies visual changes, excessive tearing, light sensitivity, weakness or numbness in extremities.  She denies pain in face, discolored nasal secretions, rashes or skin lesions, unusual bruising or bleeding.     Objective:   Physical Exam General appearance:good health ;well nourished; no acute distress or increased work of breathing is present.  No  lymphadenopathy about the head, neck, or axilla noted.   Eyes: No conjunctival inflammation or lid edema is present. There is no scleral icterus. Arcus senilis. No nystagmus seen with head rotation during position change.   Ears:  External ear exam shows no significant lesions or deformities.  Otoscopic examination reveals clear canals, tympanic membranes are intact bilaterally without bulging, retraction, inflammation or discharge.  Nose:  External nasal examination shows nasal polyps. Nasal mucosa are pink and moist without lesions or exudates. No septal dislocation or deviation.No obstruction to airflow.   Oral exam: Dental hygiene is good; lips and gums are healthy appearing.There is no oropharyngeal erythema or exudate noted.   Neck:  No deformities, thyromegaly, masses, or tenderness noted. Supple with full range of motion without pain.   Heart:  Normal rate and regular rhythm. S1 and S2 normal without gallop, murmur, click, rub or other extra sounds.   Lungs:Chest  clear to auscultation; no wheezes, rhonchi,rales ,or rubs present. No increased work of breathing.    Extremities:  No cyanosis, edema, or clubbing noted.   Neuro: CN II-XII grossly intact; oriented x 4; gait normal, including heel & toe walk; grip strength equal bilaterally; negative Rhomberg and pronator drift.   Skin: Warm & dry w/o jaundice or tenting.     Assessment & Plan:  #1 lightheadedness - cbc, bmp meclizine

## 2014-01-21 ENCOUNTER — Encounter: Payer: Self-pay | Admitting: Internal Medicine

## 2014-01-21 ENCOUNTER — Ambulatory Visit (INDEPENDENT_AMBULATORY_CARE_PROVIDER_SITE_OTHER): Payer: Medicare Other | Admitting: Internal Medicine

## 2014-01-21 ENCOUNTER — Other Ambulatory Visit (INDEPENDENT_AMBULATORY_CARE_PROVIDER_SITE_OTHER): Payer: Medicare Other

## 2014-01-21 VITALS — BP 140/90 | HR 49 | Temp 98.1°F | Resp 14 | Wt 145.2 lb

## 2014-01-21 DIAGNOSIS — D126 Benign neoplasm of colon, unspecified: Secondary | ICD-10-CM

## 2014-01-21 DIAGNOSIS — Z0000 Encounter for general adult medical examination without abnormal findings: Secondary | ICD-10-CM

## 2014-01-21 DIAGNOSIS — I10 Essential (primary) hypertension: Secondary | ICD-10-CM

## 2014-01-21 DIAGNOSIS — E782 Mixed hyperlipidemia: Secondary | ICD-10-CM

## 2014-01-21 LAB — LIPID PANEL
CHOL/HDL RATIO: 6
Cholesterol: 288 mg/dL — ABNORMAL HIGH (ref 0–200)
HDL: 51.2 mg/dL (ref >39.00)
LDL CALC: 222 mg/dL — AB (ref 0–99)
TRIGLYCERIDES: 74 mg/dL (ref 0.0–149.0)
VLDL: 14.8 mg/dL (ref 0.0–40.0)

## 2014-01-21 LAB — CBC WITH DIFFERENTIAL/PLATELET
BASOS ABS: 0 10*3/uL (ref 0.0–0.1)
Basophils Relative: 0.7 % (ref 0.0–3.0)
EOS ABS: 0.1 10*3/uL (ref 0.0–0.7)
Eosinophils Relative: 2.6 % (ref 0.0–5.0)
HCT: 40.6 % (ref 36.0–46.0)
Hemoglobin: 13.5 g/dL (ref 12.0–15.0)
Lymphocytes Relative: 35.5 % (ref 12.0–46.0)
Lymphs Abs: 1.5 10*3/uL (ref 0.7–4.0)
MCHC: 33.3 g/dL (ref 30.0–36.0)
MCV: 88.2 fl (ref 78.0–100.0)
MONOS PCT: 8.6 % (ref 3.0–12.0)
Monocytes Absolute: 0.4 10*3/uL (ref 0.1–1.0)
NEUTROS PCT: 52.6 % (ref 43.0–77.0)
Neutro Abs: 2.3 10*3/uL (ref 1.4–7.7)
Platelets: 229 10*3/uL (ref 150.0–400.0)
RBC: 4.61 Mil/uL (ref 3.87–5.11)
RDW: 15.1 % (ref 11.5–15.5)
WBC: 4.3 10*3/uL (ref 4.0–10.5)

## 2014-01-21 LAB — BASIC METABOLIC PANEL
BUN: 13 mg/dL (ref 6–23)
CHLORIDE: 104 meq/L (ref 96–112)
CO2: 28 meq/L (ref 19–32)
CREATININE: 1 mg/dL (ref 0.4–1.2)
Calcium: 9.2 mg/dL (ref 8.4–10.5)
GFR: 72.12 mL/min (ref >60.00)
Glucose, Bld: 87 mg/dL (ref 70–99)
POTASSIUM: 3.6 meq/L (ref 3.5–5.1)
SODIUM: 141 meq/L (ref 135–145)

## 2014-01-21 LAB — HEPATIC FUNCTION PANEL
ALK PHOS: 98 U/L (ref 39–117)
ALT: 17 U/L (ref 0–35)
AST: 19 U/L (ref 0–37)
Albumin: 4 g/dL (ref 3.5–5.2)
BILIRUBIN DIRECT: 0.1 mg/dL (ref 0.0–0.3)
BILIRUBIN TOTAL: 0.7 mg/dL (ref 0.2–1.2)
TOTAL PROTEIN: 7.3 g/dL (ref 6.0–8.3)

## 2014-01-21 LAB — TSH: TSH: 1.06 u[IU]/mL (ref 0.35–4.50)

## 2014-01-21 NOTE — Patient Instructions (Signed)
Your next office appointment will be determined based upon review of your pending labs . Those instructions will be transmitted to you through My Chart  OR  by mail;whichever process is your choice to receive results & recommendations .  Minimal Blood Pressure Goal= AVERAGE < 140/90;  Ideal is an AVERAGE < 135/85. This AVERAGE should be calculated from @ least 5-7 BP readings taken @ different times of day on different days of week. You should not respond to isolated BP readings , but rather the AVERAGE for that week .Please bring your  blood pressure cuff to office visits to verify that it is reliable.It  can also be checked against the blood pressure device at the pharmacy. Finger or wrist cuffs are not dependable; an arm cuff is. 

## 2014-01-21 NOTE — Progress Notes (Signed)
Subjective:    Patient ID: Stacy Page, female    DOB: 07-Mar-1933, 80 y.o.   MRN: [id number]  HPI  [employer]/Medicare Wellness Visit: Psychosocial and medical history were reviewed as required by Medicare (history related to abuse, antisocial behavior , firearm risk). Social history: Caffeine: 1cup qod , Alcohol:no  , Tobacco ERD:EYCX 1981 Exercise:see below Personal safety/fall risk:no Limitations of activities of daily living:no Seatbelt/ smoke alarm use:yes Healthcare Power of Attorney/Living Will status: UTD Ophthalmologic exam status:pending Hearing evaluation status:pending Orientation: Oriented X 3 Memory and recall: good Math testing: good Depression/anxiety assessment: no Foreign travel history:never Immunization status for influenza/pneumonia/ shingles /tetanus:UTD Transfusion history:no Preventive health care maintenance status: Colonoscopy/BMD/mammogram/Pap as per protocol/standard care:Gyn retired; colonoscopy 2011 Dental care:overdue Chart reviewed and updated. Active issues reviewed and addressed as documented below.  Blood pressure range / average :no monitor Compliant with anti hypertemsive medication.Had not taken HCT this am Occasional lightheadedness not related to medication .Meclizine prn helps.  A heart healthy /low salt diet is followed.  Exercise encompasses 60 minutes  2-3  times per week as gym & walking track without symptoms. . LifeScreen results reviewed. C-reactive protein was 0.84 which is low risk. She had mild atherosclerosis in the carotid arteries. Aortic diameter was less than 3 cm. Ankle-brachial index normal bilaterally.     Review of Systems    Significant headaches, epistaxis, chest pain, palpitations, exertional dyspnea, claudication, paroxysmal nocturnal dyspnea, or edema absent.       Objective:   Physical Exam Gen.: Healthy and well-nourished in appearance. Alert, appropriate and cooperative throughout exam. Appears younger  than stated age  Head: Normocephalic without obvious abnormalities; wearing wig.  Eyes: No corneal or conjunctival inflammation noted. Pupils equal round reactive to light and accommodation. Extraocular motion intact.  Ears: External  ear exam reveals no significant lesions or deformities. Canals clear .TMs normal. Hearing is grossly normal bilaterally. Nose: External nasal exam reveals no deformity or inflammation. Nasal mucosa are pink and moist. No lesions or exudates noted.   Mouth: Oral mucosa and oropharynx reveal no lesions or exudates. Upper plate. Neck: No deformities, masses, or tenderness noted. Range of motion & thyroid normal Lungs: Normal respiratory effort; chest expands symmetrically. Lungs are clear to auscultation without rales, wheezes, or increased work of breathing. Heart: Normal rhythm.Slow rate. Normal S1;accentuated S2. No gallop, click, or rub. No murmur. Abdomen: Bowel sounds normal; abdomen soft and nontender. No masses, organomegaly or hernias noted. Genitalia:  as per Gyn                                  Musculoskeletal/extremities:Accentuated curvature of upper thoracic spine. No clubbing, cyanosis, edema, or significant extremity  deformity noted. Lipidema present.Pes planus.Range of motion normal .Tone & strength normal.Fusiform R knee with crepitus Hand joints normal  Fingernail  health good. Able to lie down & sit up w/o help. Negative SLR bilaterally Vascular: Carotid, radial artery, dorsalis pedis and  posterior tibial pulses are equal. Decreased PTP. No bruits present. Neurologic: Alert and oriented x3. Deep tendon reflexes symmetrical and normal except decreased R knee  Gait normal         Skin: Intact without suspicious lesions or rashes. Lymph: No cervical, axillary lymphadenopathy present. Psych: Mood and affect are normal. Normally interactive  Assessment &  Plan:  #1 Medicare Wellness Exam; criteria met ; data entered #2 Problem List/Diagnoses reviewed See Current Assessment & Plan in Problem List under specific DiagnosisThe labs will be reviewed and risks and options assessed. Written recommendations will be provided by mail or directly through My Chart.Further evaluation or change in medical therapy will be directed by those results.

## 2014-01-21 NOTE — Progress Notes (Signed)
Pre visit review using our clinic review tool, if applicable. No additional management support is needed unless otherwise documented below in the visit note. 

## 2014-01-22 ENCOUNTER — Other Ambulatory Visit: Payer: Self-pay | Admitting: Internal Medicine

## 2014-01-22 DIAGNOSIS — D126 Benign neoplasm of colon, unspecified: Secondary | ICD-10-CM | POA: Insufficient documentation

## 2014-01-22 NOTE — Assessment & Plan Note (Signed)
CBC

## 2014-01-22 NOTE — Assessment & Plan Note (Signed)
Lipids, LFTs, TSH  

## 2014-01-22 NOTE — Assessment & Plan Note (Signed)
Blood pressure goals reviewed. Need for home monitor stressed. Arm BP cuff istead of wrist cuff to be used. BMET

## 2014-01-24 ENCOUNTER — Other Ambulatory Visit: Payer: Self-pay | Admitting: Internal Medicine

## 2014-01-26 ENCOUNTER — Other Ambulatory Visit: Payer: Self-pay | Admitting: Internal Medicine

## 2014-01-26 DIAGNOSIS — E782 Mixed hyperlipidemia: Secondary | ICD-10-CM

## 2014-02-04 ENCOUNTER — Other Ambulatory Visit: Payer: Self-pay | Admitting: Internal Medicine

## 2014-02-05 NOTE — Telephone Encounter (Signed)
She can't be taking this; LDL was 222. Stay off this & come in for fasting NMR Lipoprofile.

## 2014-02-05 NOTE — Telephone Encounter (Signed)
I do not see this on patient's present medication list.  

## 2014-02-06 ENCOUNTER — Telehealth: Payer: Self-pay | Admitting: Internal Medicine

## 2014-02-06 NOTE — Telephone Encounter (Deleted)
error 

## 2014-02-11 ENCOUNTER — Ambulatory Visit (INDEPENDENT_AMBULATORY_CARE_PROVIDER_SITE_OTHER): Payer: Medicare Other | Admitting: Internal Medicine

## 2014-02-11 ENCOUNTER — Encounter: Payer: Self-pay | Admitting: Internal Medicine

## 2014-02-11 VITALS — BP 144/90 | HR 51 | Temp 98.0°F | Wt 146.0 lb

## 2014-02-11 DIAGNOSIS — E782 Mixed hyperlipidemia: Secondary | ICD-10-CM

## 2014-02-11 NOTE — Progress Notes (Signed)
   Subjective:    Patient ID: Stacy Page, female    DOB: March 20, 1933, 80 y.o.   MRN: [id number]  HPI   She was told by chiropractor that she has some arterial blockages  Her most recent labs revealed an LDL of 222; HDL 51.2; triglycerides 74. She is anxious to start a cholesterol medication.No advanced panel on record.  Family history is positive for stroke in her mother in her 41s and heart attack in her father in his late 42s.      Review of Systems   Chest pain, palpitations, tachycardia, exertional dyspnea, paroxysmal nocturnal dyspnea, claudication or edema are absent.       Objective:   Physical Exam Appears healthy and well-nourished & in no acute distress.Appears MUCH  younger than stated age  NO carotid bruits are present.No neck pain distention present at 10 - 15 degrees. Thyroid normal to palpation  Heart rhythm and rate are normal with no gallop or murmur. Accentuated S2  Chest is clear with no increased work of breathing  There is no evidence of aortic aneurysm or renal artery bruits  Abdomen soft with no organomegaly or masses. No HJR  No clubbing, cyanosis or edema present.  Pedal pulses are equal; decreased PTP.  No ischemic skin changes are present . Fingernails/ toenails healthy   Alert and oriented. Strength, tone normal          Assessment & Plan:  See Current Assessment & Plan in Problem List under specific Diagnosis

## 2014-02-11 NOTE — Progress Notes (Signed)
Pre visit review using our clinic review tool, if applicable. No additional management support is needed unless otherwise documented below in the visit note. 

## 2014-02-11 NOTE — Patient Instructions (Signed)
Please  schedule fasting NMR Lipoprofile.

## 2014-02-12 ENCOUNTER — Other Ambulatory Visit: Payer: Self-pay | Admitting: Internal Medicine

## 2014-02-12 ENCOUNTER — Other Ambulatory Visit: Payer: Medicare Other

## 2014-02-12 DIAGNOSIS — E782 Mixed hyperlipidemia: Secondary | ICD-10-CM

## 2014-02-12 NOTE — Assessment & Plan Note (Signed)
NMR to optimally assess risk

## 2014-02-14 LAB — NMR LIPOPROFILE WITH LIPIDS
CHOLESTEROL, TOTAL: 262 mg/dL — AB (ref <200)
HDL PARTICLE NUMBER: 28.6 umol/L — AB (ref >=30.5)
HDL Size: 8.9 nm — ABNORMAL LOW (ref >=9.2)
HDL-C: 58 mg/dL (ref >=40)
LDL CALC: 189 mg/dL — AB (ref <100)
LDL Particle Number: 2311 nmol/L — ABNORMAL HIGH (ref <1000)
LDL Size: 21.2 nm (ref >20.5)
LP-IR SCORE: 27 (ref <=45)
Large HDL-P: 4.9 umol/L (ref >=4.8)
Large VLDL-P: 0.9 nmol/L (ref <=2.7)
Small LDL Particle Number: 574 nmol/L — ABNORMAL HIGH (ref <=527)
Triglycerides: 77 mg/dL (ref <150)
VLDL Size: 36 nm (ref <=46.6)

## 2014-02-17 ENCOUNTER — Encounter: Payer: Self-pay | Admitting: Internal Medicine

## 2014-02-20 ENCOUNTER — Telehealth: Payer: Self-pay | Admitting: Internal Medicine

## 2014-02-20 ENCOUNTER — Other Ambulatory Visit: Payer: Self-pay | Admitting: Internal Medicine

## 2014-02-20 MED ORDER — NEBIVOLOL HCL 5 MG PO TABS: ORAL_TABLET | ORAL | Status: DC

## 2014-02-20 NOTE — Telephone Encounter (Signed)
Patient advised and new script sent

## 2014-02-20 NOTE — Telephone Encounter (Signed)
1 in am & 1/2 in eve #45 ,R X 2 Monitor BP Blood Pressure Goal= AVERAGE < 140/90;  Ideal is an AVERAGE < 135/85. This AVERAGE should be calculated from @ least 5-7 BP readings taken @ different times of day on different days of week. Marland Kitchen

## 2014-02-20 NOTE — Telephone Encounter (Signed)
Pt came in and stated that she needs validation on directions on taking her Rx BYSTOLIC 5MG  TABLETS. The current directions say to take 1 and 1/2 tablets daily and she feels that is incorrect for the amount of tablets that are provided. Please contact pt when request has been reviewed.

## 2014-02-27 ENCOUNTER — Other Ambulatory Visit: Payer: Self-pay

## 2014-02-27 MED ORDER — POTASSIUM CHLORIDE ER 10 MEQ PO CPCR: 10.0000 meq | ORAL_CAPSULE | Freq: Two times a day (BID) | ORAL | Status: DC

## 2014-03-12 ENCOUNTER — Telehealth: Payer: Self-pay

## 2014-03-12 ENCOUNTER — Encounter (HOSPITAL_COMMUNITY): Payer: Self-pay | Admitting: Emergency Medicine

## 2014-03-12 ENCOUNTER — Observation Stay (HOSPITAL_COMMUNITY)
Admission: EM | Admit: 2014-03-12 | Discharge: 2014-03-13 | Disposition: A | Discharge status: Home / Self Care | Payer: Medicare Other | Attending: Internal Medicine | Admitting: Internal Medicine

## 2014-03-12 ENCOUNTER — Encounter: Payer: Self-pay | Admitting: Internal Medicine

## 2014-03-12 ENCOUNTER — Emergency Department (HOSPITAL_COMMUNITY): Payer: Medicare Other

## 2014-03-12 DIAGNOSIS — E785 Hyperlipidemia, unspecified: Secondary | ICD-10-CM | POA: Diagnosis not present

## 2014-03-12 DIAGNOSIS — R42 Dizziness and giddiness: Secondary | ICD-10-CM | POA: Diagnosis not present

## 2014-03-12 DIAGNOSIS — R609 Edema, unspecified: Secondary | ICD-10-CM

## 2014-03-12 DIAGNOSIS — J309 Allergic rhinitis, unspecified: Secondary | ICD-10-CM

## 2014-03-12 DIAGNOSIS — Z79899 Other long term (current) drug therapy: Secondary | ICD-10-CM | POA: Diagnosis not present

## 2014-03-12 DIAGNOSIS — M4800 Spinal stenosis, site unspecified: Secondary | ICD-10-CM

## 2014-03-12 DIAGNOSIS — K219 Gastro-esophageal reflux disease without esophagitis: Secondary | ICD-10-CM | POA: Diagnosis not present

## 2014-03-12 DIAGNOSIS — T887XXA Unspecified adverse effect of drug or medicament, initial encounter: Secondary | ICD-10-CM

## 2014-03-12 DIAGNOSIS — R209 Unspecified disturbances of skin sensation: Secondary | ICD-10-CM

## 2014-03-12 DIAGNOSIS — I10 Essential (primary) hypertension: Principal | ICD-10-CM

## 2014-03-12 DIAGNOSIS — Z87891 Personal history of nicotine dependence: Secondary | ICD-10-CM | POA: Diagnosis not present

## 2014-03-12 DIAGNOSIS — J45909 Unspecified asthma, uncomplicated: Secondary | ICD-10-CM | POA: Diagnosis not present

## 2014-03-12 DIAGNOSIS — E782 Mixed hyperlipidemia: Secondary | ICD-10-CM | POA: Diagnosis present

## 2014-03-12 DIAGNOSIS — I160 Hypertensive urgency: Secondary | ICD-10-CM

## 2014-03-12 DIAGNOSIS — D126 Benign neoplasm of colon, unspecified: Secondary | ICD-10-CM

## 2014-03-12 DIAGNOSIS — R498 Other voice and resonance disorders: Secondary | ICD-10-CM

## 2014-03-12 DIAGNOSIS — I951 Orthostatic hypotension: Secondary | ICD-10-CM

## 2014-03-12 DIAGNOSIS — E876 Hypokalemia: Secondary | ICD-10-CM

## 2014-03-12 DIAGNOSIS — I498 Other specified cardiac arrhythmias: Secondary | ICD-10-CM

## 2014-03-12 LAB — CBC WITH DIFFERENTIAL/PLATELET
BASOS ABS: 0 10*3/uL (ref 0.0–0.1)
Basophils Relative: 1 % (ref 0–1)
EOS PCT: 2 % (ref 0–5)
Eosinophils Absolute: 0.1 10*3/uL (ref 0.0–0.7)
HCT: 41.4 % (ref 36.0–46.0)
Hemoglobin: 13.8 g/dL (ref 12.0–15.0)
LYMPHS ABS: 2.1 10*3/uL (ref 0.7–4.0)
LYMPHS PCT: 36 % (ref 12–46)
MCH: 29 pg (ref 26.0–34.0)
MCHC: 33.3 g/dL (ref 30.0–36.0)
MCV: 87 fL (ref 78.0–100.0)
Monocytes Absolute: 0.5 10*3/uL (ref 0.1–1.0)
Monocytes Relative: 8 % (ref 3–12)
NEUTROS ABS: 3.1 10*3/uL (ref 1.7–7.7)
NEUTROS PCT: 53 % (ref 43–77)
PLATELETS: 238 10*3/uL (ref 150–400)
RBC: 4.76 MIL/uL (ref 3.87–5.11)
RDW: 14.3 % (ref 11.5–15.5)
WBC: 5.7 10*3/uL (ref 4.0–10.5)

## 2014-03-12 LAB — URINALYSIS, ROUTINE W REFLEX MICROSCOPIC
BILIRUBIN URINE: NEGATIVE
Glucose, UA: NEGATIVE mg/dL
Hgb urine dipstick: NEGATIVE
Ketones, ur: NEGATIVE mg/dL
Nitrite: NEGATIVE
Protein, ur: NEGATIVE mg/dL
Specific Gravity, Urine: 1.008 (ref 1.005–1.030)
UROBILINOGEN UA: 0.2 mg/dL (ref 0.0–1.0)
pH: 6 (ref 5.0–8.0)

## 2014-03-12 LAB — URINE MICROSCOPIC-ADD ON

## 2014-03-12 LAB — BASIC METABOLIC PANEL
ANION GAP: 13 (ref 5–15)
BUN: 18 mg/dL (ref 6–23)
CALCIUM: 9.6 mg/dL (ref 8.4–10.5)
CHLORIDE: 104 meq/L (ref 96–112)
CO2: 25 mEq/L (ref 19–32)
Creatinine, Ser: 0.99 mg/dL (ref 0.50–1.10)
GFR calc Af Amer: 61 mL/min — ABNORMAL LOW (ref >90)
GFR calc non Af Amer: 53 mL/min — ABNORMAL LOW (ref >90)
Glucose, Bld: 91 mg/dL (ref 70–99)
POTASSIUM: 4.1 meq/L (ref 3.7–5.3)
SODIUM: 142 meq/L (ref 137–147)

## 2014-03-12 LAB — TROPONIN I: Troponin I: <0.3 ng/mL (ref <0.30)

## 2014-03-12 MED ORDER — SERTRALINE HCL 25 MG PO TABS
25.0000 mg | ORAL_TABLET | Freq: Every day | ORAL | Status: DC
  Administered 2014-03-13: 25 mg via ORAL
  Filled 2014-03-12: qty 1

## 2014-03-12 MED ORDER — SODIUM CHLORIDE 0.9 % IJ SOLN
3.0000 mL | Freq: Two times a day (BID) | INTRAMUSCULAR | Status: DC
  Administered 2014-03-12: 3 mL via INTRAVENOUS

## 2014-03-12 MED ORDER — MECLIZINE HCL 12.5 MG PO TABS
12.5000 mg | ORAL_TABLET | Freq: Three times a day (TID) | ORAL | Status: DC | PRN
  Filled 2014-03-12: qty 1

## 2014-03-12 MED ORDER — LORATADINE 10 MG PO TABS
10.0000 mg | ORAL_TABLET | Freq: Every day | ORAL | Status: DC
  Administered 2014-03-13: 10 mg via ORAL
  Filled 2014-03-12: qty 1

## 2014-03-12 MED ORDER — VITAMIN C 500 MG PO TABS
500.0000 mg | ORAL_TABLET | Freq: Every day | ORAL | Status: DC
  Administered 2014-03-13: 500 mg via ORAL
  Filled 2014-03-12: qty 1

## 2014-03-12 MED ORDER — FUROSEMIDE 40 MG PO TABS
40.0000 mg | ORAL_TABLET | Freq: Every day | ORAL | Status: DC
  Administered 2014-03-13: 40 mg via ORAL
  Filled 2014-03-12: qty 1

## 2014-03-12 MED ORDER — NEBIVOLOL HCL 2.5 MG PO TABS
5.0000 mg | ORAL_TABLET | Freq: Every day | ORAL | Status: DC
  Administered 2014-03-13: 5 mg via ORAL
  Filled 2014-03-12: qty 2

## 2014-03-12 MED ORDER — GARLIC 300 MG PO TABS: 300.0000 mg | ORAL_TABLET | Freq: Every day | ORAL | Status: DC

## 2014-03-12 MED ORDER — OMEGA-3-ACID ETHYL ESTERS 1 G PO CAPS
1.0000 g | ORAL_CAPSULE | Freq: Every day | ORAL | Status: DC
  Administered 2014-03-13: 1 g via ORAL
  Filled 2014-03-12: qty 1

## 2014-03-12 MED ORDER — NEBIVOLOL HCL 2.5 MG PO TABS
2.5000 mg | ORAL_TABLET | Freq: Every day | ORAL | Status: DC
  Administered 2014-03-13: 2.5 mg via ORAL
  Filled 2014-03-12 (×2): qty 1

## 2014-03-12 MED ORDER — ONDANSETRON HCL 4 MG PO TABS: 4.0000 mg | ORAL_TABLET | Freq: Four times a day (QID) | ORAL | Status: DC | PRN

## 2014-03-12 MED ORDER — GABAPENTIN 100 MG PO CAPS
100.0000 mg | ORAL_CAPSULE | Freq: Three times a day (TID) | ORAL | Status: DC
  Administered 2014-03-13 (×2): 100 mg via ORAL
  Filled 2014-03-12 (×4): qty 1

## 2014-03-12 MED ORDER — ACETAMINOPHEN 650 MG RE SUPP: 650.0000 mg | Freq: Four times a day (QID) | RECTAL | Status: DC | PRN

## 2014-03-12 MED ORDER — HYDRALAZINE HCL 20 MG/ML IJ SOLN
20.0000 mg | Freq: Once | INTRAMUSCULAR | Status: AC
  Administered 2014-03-12: 20 mg via INTRAVENOUS
  Filled 2014-03-12: qty 1

## 2014-03-12 MED ORDER — FLUTICASONE PROPIONATE 50 MCG/ACT NA SUSP: 1.0000 | Freq: Two times a day (BID) | NASAL | Status: DC | PRN

## 2014-03-12 MED ORDER — IRBESARTAN 300 MG PO TABS
300.0000 mg | ORAL_TABLET | Freq: Every day | ORAL | Status: DC
  Administered 2014-03-13: 300 mg via ORAL
  Filled 2014-03-12: qty 1

## 2014-03-12 MED ORDER — SODIUM CHLORIDE 0.9 % IV SOLN: 250.0000 mL | INTRAVENOUS | Status: DC | PRN

## 2014-03-12 MED ORDER — ALBUTEROL SULFATE (2.5 MG/3ML) 0.083% IN NEBU: 3.0000 mL | INHALATION_SOLUTION | RESPIRATORY_TRACT | Status: DC | PRN

## 2014-03-12 MED ORDER — OMEGA-3 FATTY ACIDS 1000 MG PO CAPS: 1.0000 g | ORAL_CAPSULE | Freq: Every day | ORAL | Status: DC

## 2014-03-12 MED ORDER — LORAZEPAM 1 MG PO TABS
1.0000 mg | ORAL_TABLET | Freq: Every day | ORAL | Status: DC
  Administered 2014-03-13: 1 mg via ORAL
  Filled 2014-03-12: qty 1

## 2014-03-12 MED ORDER — ADULT MULTIVITAMIN W/MINERALS CH
1.0000 | ORAL_TABLET | Freq: Every day | ORAL | Status: DC
  Administered 2014-03-13: 1 via ORAL
  Filled 2014-03-12: qty 1

## 2014-03-12 MED ORDER — FOLIC ACID 1 MG PO TABS
1.0000 mg | ORAL_TABLET | Freq: Every day | ORAL | Status: DC
  Administered 2014-03-13: 1 mg via ORAL
  Filled 2014-03-12: qty 1

## 2014-03-12 MED ORDER — PANTOPRAZOLE SODIUM 20 MG PO TBEC
20.0000 mg | DELAYED_RELEASE_TABLET | Freq: Every day | ORAL | Status: DC
  Administered 2014-03-13: 20 mg via ORAL
  Filled 2014-03-12: qty 1

## 2014-03-12 MED ORDER — ACETAMINOPHEN 325 MG PO TABS: 650.0000 mg | ORAL_TABLET | Freq: Four times a day (QID) | ORAL | Status: DC | PRN

## 2014-03-12 MED ORDER — SODIUM CHLORIDE 0.9 % IV SOLN
INTRAVENOUS | Status: DC
  Administered 2014-03-12: 21:00:00 via INTRAVENOUS

## 2014-03-12 MED ORDER — HYDRALAZINE HCL 50 MG PO TABS
50.0000 mg | ORAL_TABLET | Freq: Four times a day (QID) | ORAL | Status: DC
  Administered 2014-03-13 (×2): 50 mg via ORAL
  Filled 2014-03-12 (×5): qty 1

## 2014-03-12 MED ORDER — COENZYME Q10 30 MG PO CAPS: 30.0000 mg | ORAL_CAPSULE | Freq: Once | ORAL | Status: DC

## 2014-03-12 MED ORDER — HORSE CHESTNUT 300 MG PO TABS: 300.0000 mg | ORAL_TABLET | Freq: Two times a day (BID) | ORAL | Status: DC

## 2014-03-12 MED ORDER — VITAMIN B-1 100 MG PO TABS
100.0000 mg | ORAL_TABLET | Freq: Every day | ORAL | Status: DC
  Administered 2014-03-13: 100 mg via ORAL
  Filled 2014-03-12: qty 1

## 2014-03-12 MED ORDER — CALCIUM CARBONATE 1250 (500 CA) MG PO TABS
1.0000 | ORAL_TABLET | Freq: Two times a day (BID) | ORAL | Status: DC
  Filled 2014-03-12 (×2): qty 1

## 2014-03-12 MED ORDER — SODIUM CHLORIDE 0.9 % IJ SOLN: 3.0000 mL | INTRAMUSCULAR | Status: DC | PRN

## 2014-03-12 MED ORDER — HEPARIN SODIUM (PORCINE) 5000 UNIT/ML IJ SOLN
5000.0000 [IU] | Freq: Three times a day (TID) | INTRAMUSCULAR | Status: DC
  Administered 2014-03-13 (×2): 5000 [IU] via SUBCUTANEOUS
  Filled 2014-03-12 (×5): qty 1

## 2014-03-12 MED ORDER — VITAMIN D3 25 MCG (1000 UNIT) PO TABS
2000.0000 [IU] | ORAL_TABLET | Freq: Every day | ORAL | Status: DC
  Administered 2014-03-13: 2000 [IU] via ORAL
  Filled 2014-03-12: qty 2

## 2014-03-12 MED ORDER — ONDANSETRON HCL 4 MG/2ML IJ SOLN: 4.0000 mg | Freq: Four times a day (QID) | INTRAMUSCULAR | Status: DC | PRN

## 2014-03-12 NOTE — ED Provider Notes (Signed)
CSN: [id number]     Arrival date & time 03/12/14  1654 History   First MD Initiated Contact with Patient 03/12/14 1953     Chief Complaint  Patient presents with  . Hypertension  . Dizziness     (Consider location/radiation/quality/duration/timing/severity/associated sxs/prior Treatment) HPI 80 year old female presents with lightheadedness is described as a room spinning sensation over the past 2 days. She has been testing her blood pressure has been more elevated than normal over the last 2 days. She went to her podiatrist today and her blood pressure was 215/100. The patient states this is been a continuous dizziness. Denies headaches, blurry vision, chest pain, shortness of breath, vomiting, or weakness, or numbness. No trouble ambulating. Nothing makes it is better or worse. She's not been missing any of her blood pressure medicines. She's on several medicines and last took them earlier today. The only recent medicine changes his been adding one half of her diastolic dose at night.  Past Medical History  Diagnosis Date  . Hypertension   . Hyperlipidemia   . Allergy     seasonal  . Asthma   . GERD (gastroesophageal reflux disease)    Past Surgical History  Procedure Laterality Date  . Bunionectomy    . Carpal tunnel release      Bilaterally  . Abdominal hysterectomy      for fibroids (no BSO)  . Macular degeneration      Dr Zadie Rhine  ; injections  . Colonoscopy  2011    tiny polyp ; [facility]   Family History  Problem Relation Age of Onset  . Alzheimer's disease Mother   . Hypertension Mother   . Stroke Mother 4  . Hypertension Father   . Heart attack Father     in 71's  . Hypertension Maternal Grandmother   . Heart attack Maternal Grandmother 73  . Diabetes      1/2 sister   History  Substance Use Topics  . Smoking status: Former Smoker    Quit date: 08/24/1979  . Smokeless tobacco: Not on file     Comment: smoked  1966-1981, up to 1 ppd  . Alcohol Use: No    OB History   Grav Para Term Preterm Abortions TAB SAB Ect Mult Living                 Review of Systems  Constitutional: Negative for fever.  Eyes: Negative for visual disturbance.  Respiratory: Negative for shortness of breath.   Cardiovascular: Negative for chest pain.  Gastrointestinal: Negative for vomiting.  Musculoskeletal: Negative for gait problem.  Neurological: Positive for dizziness. Negative for weakness, numbness and headaches.  All other systems reviewed and are negative.     Allergies  Cardizem; Aspirin; Metoclopramide hcl; Spironolactone; and Amlodipine besylate  Home Medications   Prior to Admission medications   Medication Sig Start Date End Date Taking? Authorizing Provider  albuterol (PROVENTIL HFA;VENTOLIN HFA) 108 (90 BASE) MCG/ACT inhaler Inhale 1-2 puffs into the lungs every 4 (four) hours as needed for wheezing or shortness of breath. USE 1 TO 2 PUFFS BY MOUTH EVERY 4 HOURS AS NEEDED 04/26/13  Yes Hendricks Limes, MD  Ascorbic Acid (VITAMIN C) 500 MG tablet Take 500 mg by mouth daily.     Yes Historical Provider, MD  b complex vitamins tablet Take 1 tablet by mouth daily.    Yes Historical Provider, MD  Calcium Carbonate (CALTRATE 600 PO) Take 600 mg by mouth. 2 by mouth once daily  Yes Historical Provider, MD  Cholecalciferol (VITAMIN D3) 2000 UNITS TABS Take 2,000 Units by mouth daily.     Yes Historical Provider, MD  co-enzyme Q-10 30 MG capsule Take 30 mg by mouth once.     Yes Historical Provider, MD  fexofenadine (ALLEGRA) 180 MG tablet Take 180 mg by mouth daily as needed.     Yes Historical Provider, MD  fish oil-omega-3 fatty acids 1000 MG capsule Take 1 g by mouth daily.     Yes Historical Provider, MD  fluticasone (FLONASE) 50 MCG/ACT nasal spray Place 1 spray into both nostrils 2 (two) times daily as needed for rhinitis. 10/29/13  Yes Hendricks Limes, MD  furosemide (LASIX) 40 MG tablet Take 40 mg by mouth daily.   Yes Historical Provider, MD   gabapentin (NEURONTIN) 100 MG capsule Take 100 mg by mouth 3 (three) times daily. For leg pain   Yes Historical Provider, MD  Garlic 979 MG TABS Take 300 mg by mouth daily.     Yes Historical Provider, MD  Horse Chestnut 300 MG TABS Take 300 mg by mouth 2 (two) times daily.    Yes Historical Provider, MD  hydrALAZINE (APRESOLINE) 50 MG tablet Take 50 mg by mouth 4 (four) times daily.   Yes Historical Provider, MD  LORazepam (ATIVAN) 0.5 MG tablet Take 1 mg by mouth at bedtime. do not take with Meclizine 12/21/13  Yes Hendricks Limes, MD  meclizine (ANTIVERT) 25 MG tablet Take 12.5 mg by mouth 3 (three) times daily as needed for dizziness or nausea. It should not be a maintenance medicine as it may impact balance and level of alertness. 12/21/13  Yes Hendricks Limes, MD  nebivolol (BYSTOLIC) 5 MG tablet Take 2.5-5 mg by mouth daily. 5 mg in the am and 2.5 mg in the evening   Yes Historical Provider, MD  olmesartan (BENICAR) 40 MG tablet Take 40 mg by mouth daily.   Yes Historical Provider, MD  pantoprazole (PROTONIX) 20 MG tablet Take 1 tablet (20 mg total) by mouth daily. 12/21/13  Yes Hendricks Limes, MD  potassium chloride (MICRO-K) 10 MEQ CR capsule Take 1 capsule (10 mEq total) by mouth 2 (two) times daily. 1 by mouth two times daily 02/27/14  Yes Hendricks Limes, MD  sertraline (ZOLOFT) 25 MG tablet Take 1 tablet (25 mg total) by mouth daily. 04/01/11 08/23/97 Yes Hendricks Limes, MD   BP 175/91  Pulse 47  Temp(Src) 98.2 F (36.8 C) (Oral)  Resp 18  SpO2 99% Physical Exam  Nursing note and vitals reviewed. Constitutional: She is oriented to person, place, and time. She appears well-developed and well-nourished. No distress.  HENT:  Head: Normocephalic and atraumatic.  Right Ear: External ear normal.  Left Ear: External ear normal.  Nose: Nose normal.  Eyes: EOM are normal. Pupils are equal, round, and reactive to light. Right eye exhibits no discharge. Left eye exhibits no discharge.   Cardiovascular: Normal rate, regular rhythm and normal heart sounds.   Pulmonary/Chest: Effort normal and breath sounds normal.  Abdominal: Soft. There is no tenderness.  Neurological: She is alert and oriented to person, place, and time.  Reflex Scores:      Bicep reflexes are 2+ on the right side and 2+ on the left side.      Patellar reflexes are 2+ on the right side and 2+ on the left side. CN 2-12 grossly intact. 5/5 strength in all 4 extremities. Normal finger to nose, RAM  in hands and heel to shin. Normal gait.  Skin: Skin is warm and dry.    ED Course  Procedures (including critical care time) Labs Review Labs Reviewed  BASIC METABOLIC PANEL - Abnormal; Notable for the following:    GFR calc non Af Amer 53 (*)    GFR calc Af Amer 61 (*)    All other components within normal limits  URINALYSIS, ROUTINE W REFLEX MICROSCOPIC - Abnormal; Notable for the following:    Leukocytes, UA MODERATE (*)    All other components within normal limits  URINE MICROSCOPIC-ADD ON - Abnormal; Notable for the following:    Casts HYALINE CASTS (*)    All other components within normal limits  CBC WITH DIFFERENTIAL  TROPONIN I    Imaging Review Ct Head Wo Contrast  03/12/2014   CLINICAL DATA:  Dizziness.  Hypertension.  EXAM: CT HEAD WITHOUT CONTRAST  TECHNIQUE: Contiguous axial images were obtained from the base of the skull through the vertex without intravenous contrast.  COMPARISON:  08/25/2012  FINDINGS: There is no evidence of intracranial hemorrhage, brain edema, or other signs of acute infarction. There is no evidence of intracranial mass lesion or mass effect. No abnormal extraaxial fluid collections are identified.  Mild cerebral atrophy is stable. Extensive chronic small vessel disease is stable in appearance. Ventricles are stable in size. No skull abnormality identified.  IMPRESSION: No acute intracranial abnormality.  Stable mild cerebral atrophy and extensive chronic small vessel  disease.   Electronically Signed   By: Earle Gell M.D.   On: 03/12/2014 21:47     EKG Interpretation   Date/Time:  Tuesday March 12 2014 17:10:23 EDT Ventricular Rate:  55 PR Interval:  115 QRS Duration: 84 QT Interval:  422 QTC Calculation: 404 R Axis:   -5 Text Interpretation:  Sinus rhythm Borderline short PR interval Low  voltage, precordial leads Nonspecific T abnormalities, lateral leads  difficult to intepret due to limb lead wandering baseline Confirmed by  Lumina Gitto  MD, [place] ([id number]) on 03/12/2014 7:57:28 PM      MDM   Final diagnoses:  Essential hypertension    Patient has normal neurologic exam, but continues to complain of dizziness. Her pressure is well over 200, and was given 20 mg hydralazine IV given that she takes this at home as well. The obtuse etiology for why her hypertension has been uncontrolled recently. This one dose seemed to significantly decrease her blood pressure into the 140s. Her dizziness has resolved, indicating is likely hypertensive related. No obvious stroke. Given that her blood pressures been severely hard to control despite being on multiple medicines I feel she warrants admission to the hospitalist.    Ephraim Hamburger, MD 03/12/14 2249

## 2014-03-12 NOTE — Telephone Encounter (Signed)
Patient called stating that her BP was checked around 12pm at the podiatry office 217/107. She reports some lightheaded, no SOB or chest pain and request MD advisement. Per MD take medication and recheck BP within 2 hours, if it has not come down then she need to report to ED. Per patient BP meds were taken and remain high, pt advise to report to nearest ER. She gave a verbal understanding and will call daughter.

## 2014-03-12 NOTE — Progress Notes (Signed)
PHARMACIST - PHYSICIAN ORDER COMMUNICATION  CONCERNING: P&T Medication Policy on Herbal Medications  DESCRIPTION:  This patient's order for:  Co-enzyme Z16, Garlic tabs and Horse chestnut tabs  has been noted.  This product(s) is classified as an "herbal" or natural product. Due to a lack of definitive safety studies or FDA approval, nonstandard manufacturing practices, plus the potential risk of unknown drug-drug interactions while on inpatient medications, the Pharmacy and Therapeutics Committee does not permit the use of "herbal" or natural products of this type within [facility].   ACTION TAKEN: The pharmacy department is unable to verify this order at this time and your patient has been informed of this safety policy. Please reevaluate patient's clinical condition at discharge and address if the herbal or natural product(s) should be resumed at that time.  Dorrene German 03/12/2014 11:51 PM

## 2014-03-12 NOTE — H&P (Signed)
[facility] History and Physical  Stacy Page VQQ:[id number] DOB: Oct 22, 1932 DOA: 03/12/2014  Referring physician: Sherwood Gambler, MD PCP: Unice Cobble, MD   Chief Complaint: High Blood Pressure  HPI: Stacy Page is a 80 y.o. female with a history of high blood pressure presents with dizziness. Patient was at her podiatrist today and had her blood pressure measured which was greater the 433 systolic. She states that she does normally check her pressures at home and had noted it to be running in the high 180-190 range. She had noted some dizziness but had not really called her PCP. Patient states that she has been taking her medications regularly. She states that she had no CP no palpitations. She states that she had no visual changes either. Patient has not had any CAD in the past and there is no history of stroke in the past.   Review of Systems:  Constitutional:  No weight loss, night sweats, Fevers, chills, fatigue.  HEENT:  ++headaches Cardio-vascular:  No chest pain, Orthopnea, PND, palpitations  GI:  No heartburn, indigestion, abdominal pain, nausea, vomiting, diarrhea  Resp:  No shortness of breath with exertion or at rest Skin:  no rash or lesions.  GU:  no dysuria, change in color of urine, no urgency.  Musculoskeletal:  No joint pain or swelling. No decreased range of motion.  Psych:  No change in mood or affect. No depression or anxiety. No memory loss.   Past Medical History  Diagnosis Date  . Hypertension   . Hyperlipidemia   . Allergy     seasonal  . Asthma   . GERD (gastroesophageal reflux disease)    Past Surgical History  Procedure Laterality Date  . Bunionectomy    . Carpal tunnel release      Bilaterally  . Abdominal hysterectomy      for fibroids (no BSO)  . Macular degeneration      Dr Zadie Rhine  ; injections  . Colonoscopy  2011    tiny polyp ; [facility]   Social History:  reports that she quit smoking about 34 years ago. She  does not have any smokeless tobacco history on file. She reports that she does not drink alcohol or use illicit drugs.  Allergies  Allergen Reactions  . Cardizem [Diltiazem Hcl]     bradycardia  . Aspirin     gastritis  . Metoclopramide Hcl Other (See Comments)    UNKNOWN  . Spironolactone     ? reaction  . Amlodipine Besylate     REACTION: edema    Family History  Problem Relation Age of Onset  . Alzheimer's disease Mother   . Hypertension Mother   . Stroke Mother 62  . Hypertension Father   . Heart attack Father     in 48's  . Hypertension Maternal Grandmother   . Heart attack Maternal Grandmother 73  . Diabetes      1/2 sister     Prior to Admission medications   Medication Sig Start Date End Date Taking? Authorizing Provider  albuterol (PROVENTIL HFA;VENTOLIN HFA) 108 (90 BASE) MCG/ACT inhaler Inhale 1-2 puffs into the lungs every 4 (four) hours as needed for wheezing or shortness of breath. USE 1 TO 2 PUFFS BY MOUTH EVERY 4 HOURS AS NEEDED 04/26/13  Yes Hendricks Limes, MD  Ascorbic Acid (VITAMIN C) 500 MG tablet Take 500 mg by mouth daily.     Yes Historical Provider, MD  b complex vitamins tablet Take 1 tablet by  mouth daily.    Yes Historical Provider, MD  Calcium Carbonate (CALTRATE 600 PO) Take 600 mg by mouth. 2 by mouth once daily    Yes Historical Provider, MD  Cholecalciferol (VITAMIN D3) 2000 UNITS TABS Take 2,000 Units by mouth daily.     Yes Historical Provider, MD  co-enzyme Q-10 30 MG capsule Take 30 mg by mouth once.     Yes Historical Provider, MD  fexofenadine (ALLEGRA) 180 MG tablet Take 180 mg by mouth daily as needed.     Yes Historical Provider, MD  fish oil-omega-3 fatty acids 1000 MG capsule Take 1 g by mouth daily.     Yes Historical Provider, MD  fluticasone (FLONASE) 50 MCG/ACT nasal spray Place 1 spray into both nostrils 2 (two) times daily as needed for rhinitis. 10/29/13  Yes Hendricks Limes, MD  furosemide (LASIX) 40 MG tablet Take 40 mg by  mouth daily.   Yes Historical Provider, MD  gabapentin (NEURONTIN) 100 MG capsule Take 100 mg by mouth 3 (three) times daily. For leg pain   Yes Historical Provider, MD  Garlic 025 MG TABS Take 300 mg by mouth daily.     Yes Historical Provider, MD  Horse Chestnut 300 MG TABS Take 300 mg by mouth 2 (two) times daily.    Yes Historical Provider, MD  hydrALAZINE (APRESOLINE) 50 MG tablet Take 50 mg by mouth 4 (four) times daily.   Yes Historical Provider, MD  LORazepam (ATIVAN) 0.5 MG tablet Take 1 mg by mouth at bedtime. do not take with Meclizine 12/21/13  Yes Hendricks Limes, MD  meclizine (ANTIVERT) 25 MG tablet Take 12.5 mg by mouth 3 (three) times daily as needed for dizziness or nausea. It should not be a maintenance medicine as it may impact balance and level of alertness. 12/21/13  Yes Hendricks Limes, MD  nebivolol (BYSTOLIC) 5 MG tablet Take 2.5-5 mg by mouth daily. 5 mg in the am and 2.5 mg in the evening   Yes Historical Provider, MD  olmesartan (BENICAR) 40 MG tablet Take 40 mg by mouth daily.   Yes Historical Provider, MD  pantoprazole (PROTONIX) 20 MG tablet Take 1 tablet (20 mg total) by mouth daily. 12/21/13  Yes Hendricks Limes, MD  potassium chloride (MICRO-K) 10 MEQ CR capsule Take 1 capsule (10 mEq total) by mouth 2 (two) times daily. 1 by mouth two times daily 02/27/14  Yes Hendricks Limes, MD  sertraline (ZOLOFT) 25 MG tablet Take 1 tablet (25 mg total) by mouth daily. 04/01/11 08/23/97 Yes Hendricks Limes, MD   Physical Exam: Filed Vitals:   03/12/14 1920 03/12/14 2032 03/12/14 2100 03/12/14 2156  BP: 202/75 175/91 140/71 151/73  Pulse: 47  69 73  Temp:      TempSrc:      Resp:    19  SpO2: 99%  96% 96%    Wt Readings from Last 3 Encounters:  02/11/14 66.225 kg (146 lb)  01/21/14 65.862 kg (145 lb 3.2 oz)  12/21/13 66.407 kg (146 lb 6.4 oz)    General:  Appears calm and comfortable Eyes: PERRL, normal lids, irises & conjunctiva ENT: grossly normal hearing, lips &  tongue Neck: no LAD, masses or thyromegaly Cardiovascular: RRR, no m/r/g. No LE edema.  Respiratory: CTA bilaterally, no w/r/r. Normal respiratory effort. Abdomen: soft, ntnd Skin: no rash or induration seen on limited exam Musculoskeletal: grossly normal tone BUE/BLE Psychiatric: grossly normal mood and affect, speech fluent and appropriate Neurologic:  grossly non-focal.          Labs on Admission:  Basic Metabolic Panel:  Recent Labs Lab 03/12/14 1714  NA 142  K 4.1  CL 104  CO2 25  GLUCOSE 91  BUN 18  CREATININE 0.99  CALCIUM 9.6   Liver Function Tests: No results found for this basename: AST, ALT, ALKPHOS, BILITOT, PROT, ALBUMIN,  in the last 168 hours No results found for this basename: LIPASE, AMYLASE,  in the last 168 hours No results found for this basename: AMMONIA,  in the last 168 hours CBC:  Recent Labs Lab 03/12/14 1714  WBC 5.7  NEUTROABS 3.1  HGB 13.8  HCT 41.4  MCV 87.0  PLT 238   Cardiac Enzymes:  Recent Labs Lab 03/12/14 2009  TROPONINI <0.30    BNP (last 3 results) No results found for this basename: PROBNP,  in the last 8760 hours CBG: No results found for this basename: GLUCAP,  in the last 168 hours  Radiological Exams on Admission: Ct Head Wo Contrast  03/12/2014   CLINICAL DATA:  Dizziness.  Hypertension.  EXAM: CT HEAD WITHOUT CONTRAST  TECHNIQUE: Contiguous axial images were obtained from the base of the skull through the vertex without intravenous contrast.  COMPARISON:  08/25/2012  FINDINGS: There is no evidence of intracranial hemorrhage, brain edema, or other signs of acute infarction. There is no evidence of intracranial mass lesion or mass effect. No abnormal extraaxial fluid collections are identified.  Mild cerebral atrophy is stable. Extensive chronic small vessel disease is stable in appearance. Ventricles are stable in size. No skull abnormality identified.  IMPRESSION: No acute intracranial abnormality.  Stable mild  cerebral atrophy and extensive chronic small vessel disease.   Electronically Signed   By: Earle Gell M.D.   On: 03/12/2014 21:47      Assessment/Plan Active Problems:   HYPERLIPIDEMIA   ASTHMA   Accelerated hypertension   1. Accelerated Hypertension without organ damage -admit for observation -will resume her home medications -would not lower pressure too quickly -she was given hydralazine in the ED and pressure now is improved -needs education regarding Hypertension management  2. Hyperlipidemia -will continue with home medications  3. Asthma -currently stable -will continue albuterol   Code Status: Full Code (must indicate code status--if unknown or must be presumed, indicate so) DVT Prophylaxis:Heparin Family Communication: Daughter (indicate person spoken with, if applicable, with phone number if by telephone) Disposition Plan: Home (indicate anticipated LOS)  Time spent: 26min  KHAN,SAADAT A [facility] Pager [phone]  **Disclaimer: This note may have been dictated with voice recognition software. Similar sounding words can inadvertently be transcribed and this note may contain transcription errors which may not have been corrected upon publication of note.**

## 2014-03-12 NOTE — ED Notes (Signed)
Pt c/o HTN and lightheadedness, states took BP meds today, was at pediatrist when BP was measured and it was high.

## 2014-03-13 ENCOUNTER — Encounter (HOSPITAL_COMMUNITY): Payer: Self-pay | Admitting: *Deleted

## 2014-03-13 DIAGNOSIS — I10 Essential (primary) hypertension: Secondary | ICD-10-CM

## 2014-03-13 LAB — COMPREHENSIVE METABOLIC PANEL
ALBUMIN: 3.8 g/dL (ref 3.5–5.2)
ALT: 16 U/L (ref 0–35)
ANION GAP: 15 (ref 5–15)
AST: 19 U/L (ref 0–37)
Alkaline Phosphatase: 119 U/L — ABNORMAL HIGH (ref 39–117)
BUN: 15 mg/dL (ref 6–23)
CALCIUM: 9.2 mg/dL (ref 8.4–10.5)
CO2: 22 mEq/L (ref 19–32)
CREATININE: 0.81 mg/dL (ref 0.50–1.10)
Chloride: 104 mEq/L (ref 96–112)
GFR calc Af Amer: 78 mL/min — ABNORMAL LOW (ref >90)
GFR calc non Af Amer: 67 mL/min — ABNORMAL LOW (ref >90)
Glucose, Bld: 97 mg/dL (ref 70–99)
Potassium: 3.6 mEq/L — ABNORMAL LOW (ref 3.7–5.3)
Sodium: 141 mEq/L (ref 137–147)
Total Bilirubin: 0.4 mg/dL (ref 0.3–1.2)
Total Protein: 7.2 g/dL (ref 6.0–8.3)

## 2014-03-13 LAB — CBC
HEMATOCRIT: 38.8 % (ref 36.0–46.0)
HEMOGLOBIN: 13 g/dL (ref 12.0–15.0)
MCH: 28.3 pg (ref 26.0–34.0)
MCHC: 33.5 g/dL (ref 30.0–36.0)
MCV: 84.3 fL (ref 78.0–100.0)
Platelets: 232 10*3/uL (ref 150–400)
RBC: 4.6 MIL/uL (ref 3.87–5.11)
RDW: 14.2 % (ref 11.5–15.5)
WBC: 5.3 10*3/uL (ref 4.0–10.5)

## 2014-03-13 LAB — HEMOGLOBIN A1C
Hgb A1c MFr Bld: 5.7 % — ABNORMAL HIGH (ref <5.7)
MEAN PLASMA GLUCOSE: 117 mg/dL — AB (ref <117)

## 2014-03-13 LAB — TSH: TSH: 1.66 u[IU]/mL (ref 0.350–4.500)

## 2014-03-13 MED ORDER — POTASSIUM CHLORIDE CRYS ER 20 MEQ PO TBCR
40.0000 meq | EXTENDED_RELEASE_TABLET | Freq: Once | ORAL | Status: DC
  Filled 2014-03-13: qty 2

## 2014-03-13 NOTE — Discharge Summary (Signed)
Physician Discharge Summary  Stacy Page SNK:[id number] DOB: 10/04/1932 DOA: 03/12/2014  PCP: Unice Cobble, MD  Admit date: 03/12/2014 Discharge date: 03/13/2014  Recommendations for Outpatient Follow-up:  1. Pt will need to follow up with PCP in 2-3 weeks post discharge 2. Please obtain BMP to evaluate electrolytes and kidney function 3. Please also check CBC to evaluate Hg and Hct levels  Discharge Diagnoses:  Active Problems:   HYPERLIPIDEMIA   ASTHMA   Accelerated hypertension    Discharge Condition: Stable  Diet recommendation: Heart healthy diet discussed in details   History of present illness:  80 y.o. female with a history of high blood pressure presents with dizziness. Patient was at her podiatrist earlier prior to this admission and had her blood pressure measured which was greater the 937 systolic. She states that she does normally check her pressures at home and had noted it to be running in the high 180-190 range. She had noted some dizziness but had not really called her PCP. Patient states that she has been taking her medications regularly. She states that she had no CP no palpitations. She states that she had no visual changes either. Patient has not had any CAD in the past and there is no history of stroke in the past.   Hospital Course:  Active Problems:   Accelerated hypertension - stable this AM - pt insisting to go home today - I have recommended close BP monitoring and advised pt to call her PCP is BP > 140/90 - will make no further changes on antihypertensive regimen and will defer to PCP     Procedures/Studies: Ct Head Wo Contrast  03/12/2014   CLINICAL DATA:  Dizziness.  Hypertension.  EXAM: CT HEAD WITHOUT CONTRAST  TECHNIQUE: Contiguous axial images were obtained from the base of the skull through the vertex without intravenous contrast.  COMPARISON:  08/25/2012  FINDINGS: There is no evidence of intracranial hemorrhage, brain edema, or other  signs of acute infarction. There is no evidence of intracranial mass lesion or mass effect. No abnormal extraaxial fluid collections are identified.  Mild cerebral atrophy is stable. Extensive chronic small vessel disease is stable in appearance. Ventricles are stable in size. No skull abnormality identified.  IMPRESSION: No acute intracranial abnormality.  Stable mild cerebral atrophy and extensive chronic small vessel disease.   Electronically Signed   By: Earle Gell M.D.   On: 03/12/2014 21:47   Consultations:  None  Antibiotics:  None   Discharge Exam: Filed Vitals:   03/13/14 0508  BP: 146/70  Pulse: 61  Temp: 98 F (36.7 C)  Resp: 18   Filed Vitals:   03/12/14 2156 03/12/14 2341 03/12/14 2354 03/13/14 0508  BP: 151/73  172/92 146/70  Pulse: 73  70 61  Temp:   97.9 F (36.6 C) 98 F (36.7 C)  TempSrc:   Oral Oral  Resp: 19  20 18   Height:  4\' 10"  (1.473 m)    Weight:  64.91 kg (143 lb 1.6 oz)    SpO2: 96%  100% 98%    General: Pt is alert, follows commands appropriately, not in acute distress Cardiovascular: Regular rate and rhythm, no rubs, no gallops Respiratory: Clear to auscultation bilaterally, no wheezing, no crackles, no rhonchi Abdominal: Soft, non tender, non distended, bowel sounds +, no guarding Extremities: no edema, no cyanosis, pulses palpable bilaterally DP and PT Neuro: Grossly nonfocal  Discharge Instructions  Discharge Instructions   Diet - low sodium heart healthy  Complete by:  As directed      Increase activity slowly    Complete by:  As directed             Medication List         albuterol 108 (90 BASE) MCG/ACT inhaler  Commonly known as:  PROVENTIL HFA;VENTOLIN HFA  Inhale 1-2 puffs into the lungs every 4 (four) hours as needed for wheezing or shortness of breath. USE 1 TO 2 PUFFS BY MOUTH EVERY 4 HOURS AS NEEDED     b complex vitamins tablet  Take 1 tablet by mouth daily.     CALTRATE 600 PO  Take 600 mg by mouth. 2 by  mouth once daily     co-enzyme Q-10 30 MG capsule  Take 30 mg by mouth once.     fexofenadine 180 MG tablet  Commonly known as:  ALLEGRA  Take 180 mg by mouth daily as needed.     fish oil-omega-3 fatty acids 1000 MG capsule  Take 1 g by mouth daily.     fluticasone 50 MCG/ACT nasal spray  Commonly known as:  FLONASE  Place 1 spray into both nostrils 2 (two) times daily as needed for rhinitis.     furosemide 40 MG tablet  Commonly known as:  LASIX  Take 40 mg by mouth daily.     gabapentin 100 MG capsule  Commonly known as:  NEURONTIN  Take 100 mg by mouth 3 (three) times daily. For leg pain     Garlic 850 MG Tabs  Take 300 mg by mouth daily.     Horse Chestnut 300 MG Tabs  Take 300 mg by mouth 2 (two) times daily.     hydrALAZINE 50 MG tablet  Commonly known as:  APRESOLINE  Take 50 mg by mouth 4 (four) times daily.     LORazepam 0.5 MG tablet  Commonly known as:  ATIVAN  Take 1 mg by mouth at bedtime. do not take with Meclizine     meclizine 25 MG tablet  Commonly known as:  ANTIVERT  Take 12.5 mg by mouth 3 (three) times daily as needed for dizziness or nausea. It should not be a maintenance medicine as it may impact balance and level of alertness.     nebivolol 5 MG tablet  Commonly known as:  BYSTOLIC  Take 2.7-7 mg by mouth daily. 5 mg in the am and 2.5 mg in the evening     olmesartan 40 MG tablet  Commonly known as:  BENICAR  Take 40 mg by mouth daily.     pantoprazole 20 MG tablet  Commonly known as:  PROTONIX  Take 1 tablet (20 mg total) by mouth daily.     potassium chloride 10 MEQ CR capsule  Commonly known as:  MICRO-K  Take 1 capsule (10 mEq total) by mouth 2 (two) times daily. 1 by mouth two times daily     sertraline 25 MG tablet  Commonly known as:  ZOLOFT  Take 1 tablet (25 mg total) by mouth daily.     vitamin C 500 MG tablet  Commonly known as:  ASCORBIC ACID  Take 500 mg by mouth daily.     Vitamin D3 2000 UNITS Tabs  Take 2,000  Units by mouth daily.           Follow-up Information   Call Unice Cobble, MD.   Specialty:  Internal Medicine   Contact information:   [address] [place]  [address] [phone]        The results of significant diagnostics from this hospitalization (including imaging, microbiology, ancillary and laboratory) are listed below for reference.     Microbiology: No results found for this or any previous visit (from the past 240 hour(s)).   Labs: Basic Metabolic Panel:  Recent Labs Lab 03/12/14 1714 03/13/14 0435  NA 142 141  K 4.1 3.6*  CL 104 104  CO2 25 22  GLUCOSE 91 97  BUN 18 15  CREATININE 0.99 0.81  CALCIUM 9.6 9.2   Liver Function Tests:  Recent Labs Lab 03/13/14 0435  AST 19  ALT 16  ALKPHOS 119*  BILITOT 0.4  PROT 7.2  ALBUMIN 3.8   No results found for this basename: LIPASE, AMYLASE,  in the last 168 hours No results found for this basename: AMMONIA,  in the last 168 hours CBC:  Recent Labs Lab 03/12/14 1714 03/13/14 0435  WBC 5.7 5.3  NEUTROABS 3.1  --   HGB 13.8 13.0  HCT 41.4 38.8  MCV 87.0 84.3  PLT 238 232   Cardiac Enzymes:  Recent Labs Lab 03/12/14 2009  TROPONINI <0.30   BNP: BNP (last 3 results) No results found for this basename: PROBNP,  in the last 8760 hours CBG: No results found for this basename: GLUCAP,  in the last 168 hours   SIGNED: Time coordinating discharge: Over 30 minutes  Faye Ramsay, MD  [facility] 03/13/2014, 8:52 AM Pager [phone]  If 7PM-7AM, please contact night-coverage [email or web address] Password [email or web address]

## 2014-03-13 NOTE — Discharge Instructions (Signed)
Hypertension Hypertension, commonly called high blood pressure, is when the force of blood pumping through your arteries is too strong. Your arteries are the blood vessels that carry blood from your heart throughout your body. A blood pressure reading consists of a higher number over a lower number, such as 110/72. The higher number (systolic) is the pressure inside your arteries when your heart pumps. The lower number (diastolic) is the pressure inside your arteries when your heart relaxes. Ideally you want your blood pressure below 120/80. Hypertension forces your heart to work harder to pump blood. Your arteries may become narrow or stiff. Having hypertension puts you at risk for heart disease, stroke, and other problems.  RISK FACTORS Some risk factors for high blood pressure are controllable. Others are not.  Risk factors you cannot control include:   Race. You may be at higher risk if you are African American.  Age. Risk increases with age.  Gender. Men are at higher risk than women before age 45 years. After age 65, women are at higher risk than men. Risk factors you can control include:  Not getting enough exercise or physical activity.  Being overweight.  Getting too much fat, sugar, calories, or salt in your diet.  Drinking too much alcohol. SIGNS AND SYMPTOMS Hypertension does not usually cause signs or symptoms. Extremely high blood pressure (hypertensive crisis) may cause headache, anxiety, shortness of breath, and nosebleed. DIAGNOSIS  To check if you have hypertension, your health care provider will measure your blood pressure while you are seated, with your arm held at the level of your heart. It should be measured at least twice using the same arm. Certain conditions can cause a difference in blood pressure between your right and left arms. A blood pressure reading that is higher than normal on one occasion does not mean that you need treatment. If one blood pressure reading  is high, ask your health care provider about having it checked again. TREATMENT  Treating high blood pressure includes making lifestyle changes and possibly taking medication. Living a healthy lifestyle can help lower high blood pressure. You may need to change some of your habits. Lifestyle changes may include:  Following the DASH diet. This diet is high in fruits, vegetables, and whole grains. It is low in salt, red meat, and added sugars.  Getting at least 2 1/2 hours of brisk physical activity every week.  Losing weight if necessary.  Not smoking.  Limiting alcoholic beverages.  Learning ways to reduce stress. If lifestyle changes are not enough to get your blood pressure under control, your health care provider may prescribe medicine. You may need to take more than one. Work closely with your health care provider to understand the risks and benefits. HOME CARE INSTRUCTIONS  Have your blood pressure rechecked as directed by your health care provider.   Only take medicine as directed by your health care provider. Follow the directions carefully. Blood pressure medicines must be taken as prescribed. The medicine does not work as well when you skip doses. Skipping doses also puts you at risk for problems.   Do not smoke.   Monitor your blood pressure at home as directed by your health care provider. SEEK MEDICAL CARE IF:   You think you are having a reaction to medicines taken.  You have recurrent headaches or feel dizzy.  You have swelling in your ankles.  You have trouble with your vision. SEEK IMMEDIATE MEDICAL CARE IF:  You develop a severe headache or   confusion.  You have unusual weakness, numbness, or feel faint.  You have severe chest or abdominal pain.  You vomit repeatedly.  You have trouble breathing. MAKE SURE YOU:   Understand these instructions.  Will watch your condition.  Will get help right away if you are not doing well or get  worse. Document Released: 08/09/2005 Document Revised: 08/14/2013 Document Reviewed: 06/01/2013 Exit[employer] Patient Information 2015 [employer]. This information is not intended to replace advice given to you by your health care provider. Make sure you discuss any questions you have with your health care provider.  

## 2014-03-19 ENCOUNTER — Other Ambulatory Visit: Payer: Self-pay | Admitting: Internal Medicine

## 2014-03-21 ENCOUNTER — Ambulatory Visit (INDEPENDENT_AMBULATORY_CARE_PROVIDER_SITE_OTHER): Payer: Medicare Other | Admitting: Internal Medicine

## 2014-03-21 ENCOUNTER — Other Ambulatory Visit (INDEPENDENT_AMBULATORY_CARE_PROVIDER_SITE_OTHER): Payer: Medicare Other

## 2014-03-21 ENCOUNTER — Encounter: Payer: Self-pay | Admitting: Internal Medicine

## 2014-03-21 VITALS — BP 162/80 | HR 53 | Temp 97.9°F | Wt 144.4 lb

## 2014-03-21 DIAGNOSIS — I10 Essential (primary) hypertension: Secondary | ICD-10-CM

## 2014-03-21 LAB — BASIC METABOLIC PANEL
BUN: 19 mg/dL (ref 6–23)
CALCIUM: 9.3 mg/dL (ref 8.4–10.5)
CO2: 31 mEq/L (ref 19–32)
CREATININE: 1.2 mg/dL (ref 0.4–1.2)
Chloride: 100 mEq/L (ref 96–112)
GFR: 55.73 mL/min — AB (ref >60.00)
Glucose, Bld: 81 mg/dL (ref 70–99)
Potassium: 4.2 mEq/L (ref 3.5–5.1)
Sodium: 137 mEq/L (ref 135–145)

## 2014-03-21 NOTE — Progress Notes (Signed)
Pre visit review using our clinic review tool, if applicable. No additional management support is needed unless otherwise documented below in the visit note. 

## 2014-03-21 NOTE — Patient Instructions (Signed)
   Please collect all urine over 24 period beginning Sunday morning and bring the sample to the lab Monday after completing the full 24-hour collection. This is to look for causes of resistant hypertension.

## 2014-03-21 NOTE — Progress Notes (Signed)
   Subjective:    Patient ID: Stacy Page, female    DOB: 25-Jun-1933, 80 y.o.   MRN: [id number]  HPI  She was hospitalized 7/21-22 with accelerated HTN with dizziness.The symptoms of dizziness  began while she was in her podiatrist with recorded blood pressures greater than 710 systolic.  Despite the elevated blood pressures her renal function has been normal.  Since discharge her blood pressures range 127/74-187/107.  She does describe some pedal edema.   Review of Systems  She denies significant headaches, epistaxis, chest pain, palpitations, exertional dyspnea, paroxysmal nocturnal dyspnea.  She also denies associated flushing and diarrhea       Objective:   Physical Exam Significant or distinguishing  findings on physical exam include: She appears much younger than stated age Heart rate is slow. First sound is split She has some lipedema as well as trace edema at the ankles. Posterior tibial pulses are decreased.  General appearance :adequately nourished; in no distress. Eyes: No conjunctival inflammation or scleral icterus is present. Oral exam: Lips and gums are healthy appearing.There is no oropharyngeal erythema or exudate noted.  Heart:   regular rhythm.  S2 normal without gallop, murmur, click, rub or other extra sounds   Lungs:Chest clear to auscultation; no wheezes, rhonchi,rales ,or rubs present.No increased work of breathing.  Abdomen: bowel sounds normal, soft and non-tender without masses, organomegaly or hernias noted.  No guarding or rebound.  Skin:Warm & dry.  Intact without suspicious lesions or rashes ; no jaundice or tenting Lymphatic: No lymphadenopathy is noted about the head, neck, axilla          Assessment & Plan:  #1 accelerated hypertension   See orders and recommendations.

## 2014-03-25 ENCOUNTER — Other Ambulatory Visit: Payer: Medicare Other

## 2014-03-25 DIAGNOSIS — I10 Essential (primary) hypertension: Secondary | ICD-10-CM

## 2014-03-26 LAB — ALDOSTERONE + RENIN ACTIVITY W/ RATIO
ALDO / PRA RATIO: 3 ratio (ref 0.9–28.9)
Aldosterone: 12 ng/dL
PRA LC/MS/MS: 4.03 ng/mL/h (ref 0.25–5.82)

## 2014-03-27 ENCOUNTER — Other Ambulatory Visit: Payer: Self-pay

## 2014-03-27 MED ORDER — MECLIZINE HCL 25 MG PO TABS: 12.5000 mg | ORAL_TABLET | Freq: Three times a day (TID) | ORAL | Status: DC | PRN

## 2014-03-27 NOTE — Telephone Encounter (Signed)
The medication is " as needed" and should not be taken on a regular basis. It should be taken :" one half to one every 8 hours as needed only". Regular use can affect level of alertness and balance with increased risk of falling.

## 2014-03-28 LAB — METANEPHRINES, URINE, 24 HOUR
Metaneph Total, Ur: 454 mcg/24 h (ref 224–832)
Metanephrines, Ur: 124 mcg/24 h (ref 90–315)
Normetanephrine, 24H Ur: 330 mcg/24 h (ref 122–676)

## 2014-03-28 LAB — CATECHOLAMINES, FRACTIONATED, URINE, 24 HOUR
CALCULATED TOTAL (E+ NE): 32 ug/(24.h) (ref 26–121)
Creatinine, Urine mg/day-CATEUR: 0.99 g/(24.h) (ref 0.63–2.50)
DOPAMINE, 24 HR URINE: 123 ug/(24.h) (ref 52–480)
NOREPINEPHRINE, 24 HR UR: 32 ug/(24.h) (ref 15–100)
Total Volume - CF 24Hr U: 2400 mL

## 2014-03-31 ENCOUNTER — Emergency Department (HOSPITAL_COMMUNITY)
Admission: EM | Admit: 2014-03-31 | Discharge: 2014-03-31 | Disposition: A | Discharge status: Home / Self Care | Payer: Medicare Other | Attending: Emergency Medicine | Admitting: Emergency Medicine

## 2014-03-31 ENCOUNTER — Encounter (HOSPITAL_COMMUNITY): Payer: Self-pay | Admitting: Emergency Medicine

## 2014-03-31 DIAGNOSIS — Z8639 Personal history of other endocrine, nutritional and metabolic disease: Secondary | ICD-10-CM | POA: Insufficient documentation

## 2014-03-31 DIAGNOSIS — IMO0002 Reserved for concepts with insufficient information to code with codable children: Secondary | ICD-10-CM | POA: Diagnosis not present

## 2014-03-31 DIAGNOSIS — K219 Gastro-esophageal reflux disease without esophagitis: Secondary | ICD-10-CM | POA: Diagnosis not present

## 2014-03-31 DIAGNOSIS — J45909 Unspecified asthma, uncomplicated: Secondary | ICD-10-CM | POA: Diagnosis not present

## 2014-03-31 DIAGNOSIS — Z862 Personal history of diseases of the blood and blood-forming organs and certain disorders involving the immune mechanism: Secondary | ICD-10-CM | POA: Diagnosis not present

## 2014-03-31 DIAGNOSIS — I10 Essential (primary) hypertension: Secondary | ICD-10-CM | POA: Diagnosis not present

## 2014-03-31 DIAGNOSIS — Z792 Long term (current) use of antibiotics: Secondary | ICD-10-CM | POA: Diagnosis not present

## 2014-03-31 DIAGNOSIS — Z87891 Personal history of nicotine dependence: Secondary | ICD-10-CM | POA: Insufficient documentation

## 2014-03-31 DIAGNOSIS — Z79899 Other long term (current) drug therapy: Secondary | ICD-10-CM | POA: Insufficient documentation

## 2014-03-31 MED ORDER — HYDRALAZINE HCL 50 MG PO TABS
50.0000 mg | ORAL_TABLET | Freq: Once | ORAL | Status: DC
  Filled 2014-03-31: qty 1

## 2014-03-31 MED ORDER — NEBIVOLOL HCL 2.5 MG PO TABS
2.5000 mg | ORAL_TABLET | Freq: Once | ORAL | Status: AC
  Administered 2014-03-31: 2.5 mg via ORAL
  Filled 2014-03-31: qty 1

## 2014-03-31 MED ORDER — HYDRALAZINE HCL 50 MG PO TABS
75.0000 mg | ORAL_TABLET | Freq: Once | ORAL | Status: AC
  Administered 2014-03-31: 75 mg via ORAL
  Filled 2014-03-31: qty 1

## 2014-03-31 NOTE — ED Provider Notes (Addendum)
CSN: [id number]     Arrival date & time 03/31/14  1924 History   First MD Initiated Contact with Patient 03/31/14 1938     Chief Complaint  Patient presents with  . Hypertension     (Consider location/radiation/quality/duration/timing/severity/associated sxs/prior Treatment) The history is provided by the patient.  Stacy Page is a 80 y.o. female hx of HTN, HL here with hypertension, dizziness. Has some dizziness today, check her BP and it was elevated. Was seen in the ED 2 weeks ago for the same symptoms and had nl blood work, CT head. She saw PMD and had urine studies done but didn't have her BP meds adjusted. Denies chest pain or shortness of breath or abdominal pain.     Past Medical History  Diagnosis Date  . Hypertension   . Hyperlipidemia   . Allergy     seasonal  . Asthma   . GERD (gastroesophageal reflux disease)    Past Surgical History  Procedure Laterality Date  . Bunionectomy    . Carpal tunnel release      Bilaterally  . Abdominal hysterectomy      for fibroids (no BSO)  . Macular degeneration      Dr Zadie Rhine  ; injections  . Colonoscopy  2011    tiny polyp ; [facility]   Family History  Problem Relation Age of Onset  . Alzheimer's disease Mother   . Hypertension Mother   . Stroke Mother 63  . Hypertension Father   . Heart attack Father     in 27's  . Hypertension Maternal Grandmother   . Heart attack Maternal Grandmother 73  . Diabetes      1/2 sister   History  Substance Use Topics  . Smoking status: Former Smoker    Quit date: 08/24/1979  . Smokeless tobacco: Not on file     Comment: smoked  1966-1981, up to 1 ppd  . Alcohol Use: No   OB History   Grav Para Term Preterm Abortions TAB SAB Ect Mult Living                 Review of Systems  Neurological: Positive for dizziness.  All other systems reviewed and are negative.     Allergies  Cardizem; Aspirin; Metoclopramide hcl; Spironolactone; and Amlodipine besylate  Home  Medications   Prior to Admission medications   Medication Sig Start Date End Date Taking? Authorizing Provider  albuterol (PROVENTIL HFA;VENTOLIN HFA) 108 (90 BASE) MCG/ACT inhaler Inhale 1-2 puffs into the lungs every 4 (four) hours as needed for wheezing or shortness of breath. USE 1 TO 2 PUFFS BY MOUTH EVERY 4 HOURS AS NEEDED 04/26/13  Yes Hendricks Limes, MD  Ascorbic Acid (VITAMIN C) 500 MG tablet Take 500 mg by mouth daily.     Yes Historical Provider, MD  b complex vitamins tablet Take 1 tablet by mouth daily.    Yes Historical Provider, MD  Calcium Carbonate (CALTRATE 600 PO) Take 600 mg by mouth. 2 by mouth once daily    Yes Historical Provider, MD  Cholecalciferol (VITAMIN D3) 2000 UNITS TABS Take 2,000 Units by mouth daily.     Yes Historical Provider, MD  co-enzyme Q-10 30 MG capsule Take 30 mg by mouth daily.    Yes Historical Provider, MD  cycloSPORINE (RESTASIS) 0.05 % ophthalmic emulsion Place 1 drop into both eyes 2 (two) times daily.   Yes Historical Provider, MD  fexofenadine (ALLEGRA) 180 MG tablet Take 180 mg by mouth  daily as needed for allergies.    Yes Historical Provider, MD  fish oil-omega-3 fatty acids 1000 MG capsule Take 1 g by mouth daily.     Yes Historical Provider, MD  fluticasone (FLONASE) 50 MCG/ACT nasal spray Place 1 spray into both nostrils 2 (two) times daily as needed for rhinitis. 10/29/13  Yes Hendricks Limes, MD  fluticasone (FLONASE) 50 MCG/ACT nasal spray Place 1 spray into both nostrils daily.   Yes Historical Provider, MD  furosemide (LASIX) 40 MG tablet Take 40 mg by mouth daily.   Yes Historical Provider, MD  gabapentin (NEURONTIN) 100 MG capsule Take 100 mg by mouth 3 (three) times daily. For leg pain   Yes Historical Provider, MD  Garlic 387 MG TABS Take 300 mg by mouth daily.     Yes Historical Provider, MD  Horse Chestnut 300 MG TABS Take 300 mg by mouth 2 (two) times daily.    Yes Historical Provider, MD  hydrALAZINE (APRESOLINE) 50 MG tablet  Take 50 mg by mouth 4 (four) times daily.   Yes Historical Provider, MD  LORazepam (ATIVAN) 0.5 MG tablet Take 1 mg by mouth at bedtime. do not take with Meclizine 12/21/13  Yes Hendricks Limes, MD  meclizine (ANTIVERT) 25 MG tablet Take 0.5 tablets (12.5 mg total) by mouth 3 (three) times daily as needed for dizziness or nausea. It should not be a maintenance medicine as it may impact balance and level of alertness. 03/27/14  Yes Hendricks Limes, MD  nebivolol (BYSTOLIC) 5 MG tablet Take 2.5-5 mg by mouth daily. 5 mg in the am and 2.5 mg in the evening   Yes Historical Provider, MD  olmesartan (BENICAR) 40 MG tablet Take 40 mg by mouth daily.   Yes Historical Provider, MD  pantoprazole (PROTONIX) 20 MG tablet Take 1 tablet (20 mg total) by mouth daily. 12/21/13  Yes Hendricks Limes, MD  potassium chloride (K-DUR,KLOR-CON) 10 MEQ tablet Take 10 mEq by mouth 2 (two) times daily.   Yes Historical Provider, MD   BP 206/88  Pulse 56  Temp(Src) 98.7 F (37.1 C) (Oral)  Resp 20  Wt 144 lb (65.318 kg)  SpO2 99% Physical Exam  Nursing note and vitals reviewed. Constitutional: She is oriented to person, place, and time. She appears well-nourished.  NAD   HENT:  Head: Normocephalic.  Mouth/Throat: Oropharynx is clear and moist.  Eyes: Conjunctivae and EOM are normal. Pupils are equal, round, and reactive to light.  Neck: Normal range of motion. Neck supple.  Cardiovascular: Normal rate, regular rhythm and normal heart sounds.   Pulmonary/Chest: Effort normal and breath sounds normal. No respiratory distress. She has no wheezes. She has no rales.  Abdominal: Soft. Bowel sounds are normal. She exhibits no distension. There is no tenderness. There is no rebound and no guarding.  Musculoskeletal: Normal range of motion. She exhibits no edema and no tenderness.  Neurological: She is alert and oriented to person, place, and time. No cranial nerve deficit. Coordination normal.  Nl strength throughout,  normal sensation. Cn 2-12 intact   Skin: Skin is warm and dry.  Psychiatric: She has a normal mood and affect. Her behavior is normal. Judgment and thought content normal.    ED Course  Procedures (including critical care time) Labs Review Labs Reviewed - No data to display  Imaging Review No results found.   EKG Interpretation None      MDM   Final diagnoses:  None    Seth  Guttierrez is a 80 y.o. female here with dizziness, hypertension. Neuro exam unremarkable. I doubt intracranial bleed or dissection. I think likely symptomatic hypertension. Didn't take night time medicines yet so will give PO meds. She is bradycardic so will not increase bystolic, maxed out on benicar. I will increase noon dose of hydralazine to 75 mg from 50 mg. Will have him f/u with PMD and possibly cardiology to adjust BP meds.   9:49 PM BP dec to 160/80 on d/c. Dizziness improved, likely from hypertension.    Wandra Arthurs, MD 03/31/14 3435  Wandra Arthurs, MD 03/31/14 2150

## 2014-03-31 NOTE — ED Notes (Signed)
Pt arrives to room in wheelchair.

## 2014-03-31 NOTE — Discharge Instructions (Signed)
Increase hydralazine to 75 mg at noon. Keep other meds the same.   Follow up with your doctor. You may want to see cardiology for medication adjustment.   Return to ER if you have worse dizziness, headache, vomiting, chest pain, shortness of breath, abdominal pain.

## 2014-03-31 NOTE — ED Notes (Signed)
Pt arrived to the ED with a complaint of hypertension.  Pt was seen here two weeks ago for same, referred to PCP.  Per pt PCP didn't change any medication.  Pt denies headache but has lightheadedness.

## 2014-04-02 ENCOUNTER — Encounter: Payer: Self-pay | Admitting: Internal Medicine

## 2014-04-02 ENCOUNTER — Ambulatory Visit (INDEPENDENT_AMBULATORY_CARE_PROVIDER_SITE_OTHER): Payer: Medicare Other | Admitting: Internal Medicine

## 2014-04-02 VITALS — BP 134/57 | HR 60 | Temp 98.3°F | Wt 145.4 lb

## 2014-04-02 DIAGNOSIS — R42 Dizziness and giddiness: Secondary | ICD-10-CM

## 2014-04-02 DIAGNOSIS — I10 Essential (primary) hypertension: Secondary | ICD-10-CM

## 2014-04-02 MED ORDER — NEBIVOLOL HCL 5 MG PO TABS: ORAL_TABLET | ORAL | Status: DC

## 2014-04-02 NOTE — Progress Notes (Signed)
Pre visit review using our clinic review tool, if applicable. No additional management support is needed unless otherwise documented below in the visit note. 

## 2014-04-02 NOTE — Patient Instructions (Signed)
The Cardiology  referral will be scheduled and you'll be notified of the time.

## 2014-04-02 NOTE — Progress Notes (Signed)
   Subjective:    Patient ID: Stacy Page, female    DOB: December 20, 1932, 80 y.o.   MRN: [id number]  HPI   The emergency room records 03/31/14 were reviewed. She was seen for dizziness with blood pressure of 206/88. She was given 75 mg of hydralazine orally; symptoms improved and blood pressure was 160/80 at the time of discharge.  Since discharge blood pressures have ranged 144/74-187/97.  Other than chronic edema she has no active cardiopulmonary symptoms.  She has been compliant with her medications. She does restrict sodium.  She is inquiring about having her carotid arteries studied. This was done or symptoms of dizziness 08/25/12. No significant internal carotid or stenosis was present. Vertebral artery flow was antegrade.        Review of Systems   Chest pain, palpitations, tachycardia, exertional dyspnea, paroxysmal nocturnal dyspnea,or  claudication are absent.       Objective:   Physical Exam  Positive or pertinent findings include a grade 8/7-8 systolic murmur. She appears much younger than her stated age. Aorta is palpable without aneurysmal dilation Posterior tibial pulses are decreased There is lipedema of the ankles rather than getting edema.  Appears healthy and well-nourished & in no acute distress No carotid bruits are present.No neck pain distention present at 10 - 15 degrees. Thyroid normal to palpation Heart rhythm and rate are normal with no gallop  Chest is clear with no increased work of breathing There is no evidence of  renal artery bruits Abdomen soft with no organomegaly or masses. No HJR No clubbing, cyanosis  present. Pedal pulses are equal No ischemic skin changes are present . Fingernails/ toenails healthy  Alert and oriented. Strength, tone normal         Assessment & Plan:  #1 hypertension, adequately controlled. She has paroxysmal elevations of blood pressure with associated dizziness./Aldosterone studies and pheochromocytoma  evaluation negative.  Plan: Cardiology consultation to assess this paroxysmal hypertension.  Systolic will be increased to 5 mg twice a day and hydralazine 50 mg 4 times a day.

## 2014-04-07 ENCOUNTER — Other Ambulatory Visit: Payer: Self-pay | Admitting: Internal Medicine

## 2014-04-25 ENCOUNTER — Other Ambulatory Visit: Payer: Self-pay

## 2014-04-25 MED ORDER — MECLIZINE HCL 25 MG PO TABS: 12.5000 mg | ORAL_TABLET | Freq: Three times a day (TID) | ORAL | Status: DC | PRN

## 2014-04-25 NOTE — Telephone Encounter (Signed)
  Antivert (Meclizine) is among those which experts have documented to have a very  high risk of affecting  mental  alertness  & balance. This results in increased risk of falling with serious health or life threatening injury. Such medication should be taken as infrequently as possible and @  the lowest possible dose.It should not be taken with alcohol, sedatives  or other agents which have a similar  adverse risk potential. These risks are greater as we age as there is decreased ability of the liver and kidneys to metabolize and excrete the medication, resulting in   increased blood levels of the active ingredient.  #15; this is not a maintenance

## 2014-05-14 ENCOUNTER — Other Ambulatory Visit: Payer: Self-pay

## 2014-05-14 MED ORDER — LORAZEPAM 0.5 MG PO TABS: ORAL_TABLET | ORAL | Status: DC

## 2014-05-14 NOTE — Telephone Encounter (Signed)
Last office visit 04-02-14

## 2014-05-14 NOTE — Telephone Encounter (Signed)
   OK but label 1-2 qhs prn only; do not take routinely

## 2014-05-14 NOTE — Telephone Encounter (Signed)
Lorazepam has been phoned to pharmacy voicemail line

## 2014-05-21 ENCOUNTER — Other Ambulatory Visit: Payer: Self-pay | Admitting: Internal Medicine

## 2014-05-22 ENCOUNTER — Emergency Department (HOSPITAL_COMMUNITY): Payer: Medicare Other

## 2014-05-22 ENCOUNTER — Encounter (HOSPITAL_COMMUNITY): Payer: Self-pay | Admitting: Emergency Medicine

## 2014-05-22 ENCOUNTER — Inpatient Hospital Stay (HOSPITAL_COMMUNITY): Payer: Medicare Other

## 2014-05-22 ENCOUNTER — Observation Stay (HOSPITAL_COMMUNITY)
Admission: EM | Admit: 2014-05-22 | Discharge: 2014-05-24 | Disposition: A | Discharge status: Home / Self Care | Payer: Medicare Other | Attending: Internal Medicine | Admitting: Internal Medicine

## 2014-05-22 DIAGNOSIS — E876 Hypokalemia: Secondary | ICD-10-CM

## 2014-05-22 DIAGNOSIS — I951 Orthostatic hypotension: Secondary | ICD-10-CM | POA: Diagnosis present

## 2014-05-22 DIAGNOSIS — R609 Edema, unspecified: Secondary | ICD-10-CM

## 2014-05-22 DIAGNOSIS — I498 Other specified cardiac arrhythmias: Secondary | ICD-10-CM | POA: Insufficient documentation

## 2014-05-22 DIAGNOSIS — K219 Gastro-esophageal reflux disease without esophagitis: Secondary | ICD-10-CM | POA: Diagnosis not present

## 2014-05-22 DIAGNOSIS — E669 Obesity, unspecified: Secondary | ICD-10-CM | POA: Insufficient documentation

## 2014-05-22 DIAGNOSIS — I160 Hypertensive urgency: Secondary | ICD-10-CM

## 2014-05-22 DIAGNOSIS — G5620 Lesion of ulnar nerve, unspecified upper limb: Secondary | ICD-10-CM | POA: Diagnosis not present

## 2014-05-22 DIAGNOSIS — Z79899 Other long term (current) drug therapy: Secondary | ICD-10-CM | POA: Insufficient documentation

## 2014-05-22 DIAGNOSIS — D126 Benign neoplasm of colon, unspecified: Secondary | ICD-10-CM

## 2014-05-22 DIAGNOSIS — Z6830 Body mass index (BMI) 30.0-30.9, adult: Secondary | ICD-10-CM | POA: Diagnosis not present

## 2014-05-22 DIAGNOSIS — E785 Hyperlipidemia, unspecified: Secondary | ICD-10-CM

## 2014-05-22 DIAGNOSIS — Z87891 Personal history of nicotine dependence: Secondary | ICD-10-CM | POA: Diagnosis not present

## 2014-05-22 DIAGNOSIS — I10 Essential (primary) hypertension: Secondary | ICD-10-CM | POA: Diagnosis not present

## 2014-05-22 DIAGNOSIS — J45909 Unspecified asthma, uncomplicated: Secondary | ICD-10-CM | POA: Diagnosis not present

## 2014-05-22 DIAGNOSIS — R29898 Other symptoms and signs involving the musculoskeletal system: Secondary | ICD-10-CM | POA: Diagnosis present

## 2014-05-22 DIAGNOSIS — R209 Unspecified disturbances of skin sensation: Secondary | ICD-10-CM

## 2014-05-22 DIAGNOSIS — G459 Transient cerebral ischemic attack, unspecified: Secondary | ICD-10-CM | POA: Diagnosis not present

## 2014-05-22 DIAGNOSIS — R4181 Age-related cognitive decline: Secondary | ICD-10-CM | POA: Diagnosis not present

## 2014-05-22 DIAGNOSIS — G458 Other transient cerebral ischemic attacks and related syndromes: Secondary | ICD-10-CM

## 2014-05-22 DIAGNOSIS — M4800 Spinal stenosis, site unspecified: Secondary | ICD-10-CM | POA: Diagnosis not present

## 2014-05-22 DIAGNOSIS — G5622 Lesion of ulnar nerve, left upper limb: Secondary | ICD-10-CM | POA: Insufficient documentation

## 2014-05-22 DIAGNOSIS — E782 Mixed hyperlipidemia: Secondary | ICD-10-CM | POA: Diagnosis present

## 2014-05-22 LAB — RAPID URINE DRUG SCREEN, HOSP PERFORMED
Amphetamines: NOT DETECTED
BENZODIAZEPINES: NOT DETECTED
Barbiturates: NOT DETECTED
COCAINE: NOT DETECTED
OPIATES: NOT DETECTED
Tetrahydrocannabinol: NOT DETECTED

## 2014-05-22 LAB — CBC
HCT: 37.1 % (ref 36.0–46.0)
HEMATOCRIT: 41.4 % (ref 36.0–46.0)
Hemoglobin: 13.9 g/dL (ref 12.0–15.0)
Hemoglobin: 12.7 g/dL (ref 12.0–15.0)
MCH: 29.1 pg (ref 26.0–34.0)
MCH: 28.7 pg (ref 26.0–34.0)
MCHC: 33.6 g/dL (ref 30.0–36.0)
MCHC: 34.2 g/dL (ref 30.0–36.0)
MCV: 86.6 fL (ref 78.0–100.0)
MCV: 83.9 fL (ref 78.0–100.0)
PLATELETS: 230 10*3/uL (ref 150–400)
Platelets: 220 10*3/uL (ref 150–400)
RBC: 4.78 MIL/uL (ref 3.87–5.11)
RBC: 4.42 MIL/uL (ref 3.87–5.11)
RDW: 14.4 % (ref 11.5–15.5)
RDW: 14.1 % (ref 11.5–15.5)
WBC: 3.9 10*3/uL — ABNORMAL LOW (ref 4.0–10.5)
WBC: 4.5 10*3/uL (ref 4.0–10.5)

## 2014-05-22 LAB — COMPREHENSIVE METABOLIC PANEL
ALT: 12 U/L (ref 0–35)
AST: 18 U/L (ref 0–37)
Albumin: 4 g/dL (ref 3.5–5.2)
Alkaline Phosphatase: 124 U/L — ABNORMAL HIGH (ref 39–117)
Anion gap: 16 — ABNORMAL HIGH (ref 5–15)
BUN: 13 mg/dL (ref 6–23)
CALCIUM: 9.4 mg/dL (ref 8.4–10.5)
CHLORIDE: 103 meq/L (ref 96–112)
CO2: 24 mEq/L (ref 19–32)
Creatinine, Ser: 0.83 mg/dL (ref 0.50–1.10)
GFR calc Af Amer: 76 mL/min — ABNORMAL LOW (ref >90)
GFR calc non Af Amer: 65 mL/min — ABNORMAL LOW (ref >90)
Glucose, Bld: 98 mg/dL (ref 70–99)
Potassium: 3.6 mEq/L — ABNORMAL LOW (ref 3.7–5.3)
Sodium: 143 mEq/L (ref 137–147)
TOTAL PROTEIN: 7.9 g/dL (ref 6.0–8.3)
Total Bilirubin: 0.2 mg/dL — ABNORMAL LOW (ref 0.3–1.2)

## 2014-05-22 LAB — URINE MICROSCOPIC-ADD ON

## 2014-05-22 LAB — DIFFERENTIAL
Basophils Absolute: 0 10*3/uL (ref 0.0–0.1)
Basophils Relative: 1 % (ref 0–1)
Eosinophils Absolute: 0 10*3/uL (ref 0.0–0.7)
Eosinophils Relative: 1 % (ref 0–5)
Lymphocytes Relative: 26 % (ref 12–46)
Lymphs Abs: 1 10*3/uL (ref 0.7–4.0)
Monocytes Absolute: 0.2 10*3/uL (ref 0.1–1.0)
Monocytes Relative: 6 % (ref 3–12)
NEUTROS ABS: 2.6 10*3/uL (ref 1.7–7.7)
Neutrophils Relative %: 66 % (ref 43–77)

## 2014-05-22 LAB — I-STAT CHEM 8, ED
BUN: 12 mg/dL (ref 6–23)
Calcium, Ion: 1.13 mmol/L (ref 1.13–1.30)
Chloride: 107 mEq/L (ref 96–112)
Creatinine, Ser: 0.9 mg/dL (ref 0.50–1.10)
GLUCOSE: 99 mg/dL (ref 70–99)
HCT: 47 % — ABNORMAL HIGH (ref 36.0–46.0)
HEMOGLOBIN: 16 g/dL — AB (ref 12.0–15.0)
POTASSIUM: 3.3 meq/L — AB (ref 3.7–5.3)
SODIUM: 141 meq/L (ref 137–147)
TCO2: 24 mmol/L (ref 0–100)

## 2014-05-22 LAB — URINALYSIS, ROUTINE W REFLEX MICROSCOPIC
BILIRUBIN URINE: NEGATIVE
GLUCOSE, UA: NEGATIVE mg/dL
Hgb urine dipstick: NEGATIVE
Ketones, ur: NEGATIVE mg/dL
Nitrite: NEGATIVE
PH: 6.5 (ref 5.0–8.0)
Protein, ur: NEGATIVE mg/dL
SPECIFIC GRAVITY, URINE: 1.012 (ref 1.005–1.030)
Urobilinogen, UA: 0.2 mg/dL (ref 0.0–1.0)

## 2014-05-22 LAB — CREATININE, SERUM
Creatinine, Ser: 0.73 mg/dL (ref 0.50–1.10)
GFR calc Af Amer: >90 mL/min (ref >90)
GFR calc non Af Amer: 79 mL/min — ABNORMAL LOW (ref >90)

## 2014-05-22 LAB — ETHANOL: Alcohol, Ethyl (B): <11 mg/dL (ref 0–11)

## 2014-05-22 LAB — I-STAT TROPONIN, ED: TROPONIN I, POC: 0.01 ng/mL (ref 0.00–0.08)

## 2014-05-22 LAB — APTT: APTT: 32 s (ref 24–37)

## 2014-05-22 LAB — PROTIME-INR
INR: 0.98 (ref 0.00–1.49)
Prothrombin Time: 13 seconds (ref 11.6–15.2)

## 2014-05-22 LAB — TROPONIN I: Troponin I: <0.3 ng/mL (ref <0.30)

## 2014-05-22 MED ORDER — FLUTICASONE PROPIONATE 50 MCG/ACT NA SUSP
2.0000 | Freq: Every day | NASAL | Status: DC
  Administered 2014-05-23 – 2014-05-24 (×2): 2 via NASAL
  Filled 2014-05-22: qty 16

## 2014-05-22 MED ORDER — POLYETHYLENE GLYCOL 3350 17 G PO PACK: 17.0000 g | PACK | Freq: Every day | ORAL | Status: DC | PRN

## 2014-05-22 MED ORDER — GUAIFENESIN-DM 100-10 MG/5ML PO SYRP: 5.0000 mL | ORAL_SOLUTION | ORAL | Status: DC | PRN

## 2014-05-22 MED ORDER — HYDRALAZINE HCL 20 MG/ML IJ SOLN
10.0000 mg | Freq: Once | INTRAMUSCULAR | Status: AC
  Administered 2014-05-22: 10 mg via INTRAVENOUS
  Filled 2014-05-22: qty 1

## 2014-05-22 MED ORDER — POTASSIUM CHLORIDE CRYS ER 20 MEQ PO TBCR
40.0000 meq | EXTENDED_RELEASE_TABLET | Freq: Once | ORAL | Status: AC
  Administered 2014-05-22: 40 meq via ORAL
  Filled 2014-05-22: qty 2

## 2014-05-22 MED ORDER — ALBUTEROL SULFATE HFA 108 (90 BASE) MCG/ACT IN AERS: 2.0000 | INHALATION_SPRAY | Freq: Four times a day (QID) | RESPIRATORY_TRACT | Status: DC | PRN

## 2014-05-22 MED ORDER — SODIUM CHLORIDE 0.9 % IJ SOLN
3.0000 mL | Freq: Two times a day (BID) | INTRAMUSCULAR | Status: DC
  Administered 2014-05-22 – 2014-05-24 (×4): 3 mL via INTRAVENOUS

## 2014-05-22 MED ORDER — FUROSEMIDE 40 MG PO TABS
40.0000 mg | ORAL_TABLET | Freq: Every day | ORAL | Status: DC
  Administered 2014-05-23 – 2014-05-24 (×2): 40 mg via ORAL
  Filled 2014-05-22 (×2): qty 1

## 2014-05-22 MED ORDER — HEPARIN SODIUM (PORCINE) 5000 UNIT/ML IJ SOLN
5000.0000 [IU] | Freq: Three times a day (TID) | INTRAMUSCULAR | Status: DC
  Administered 2014-05-22 – 2014-05-24 (×6): 5000 [IU] via SUBCUTANEOUS
  Filled 2014-05-22 (×6): qty 1

## 2014-05-22 MED ORDER — ALUM & MAG HYDROXIDE-SIMETH 200-200-20 MG/5ML PO SUSP: 30.0000 mL | Freq: Four times a day (QID) | ORAL | Status: DC | PRN

## 2014-05-22 MED ORDER — ONDANSETRON HCL 4 MG PO TABS: 4.0000 mg | ORAL_TABLET | Freq: Four times a day (QID) | ORAL | Status: DC | PRN

## 2014-05-22 MED ORDER — HYDROCODONE-ACETAMINOPHEN 5-325 MG PO TABS
1.0000 | ORAL_TABLET | ORAL | Status: DC | PRN
  Administered 2014-05-22: 1 via ORAL
  Filled 2014-05-22: qty 1

## 2014-05-22 MED ORDER — OMEGA-3-ACID ETHYL ESTERS 1 G PO CAPS
1.0000 g | ORAL_CAPSULE | Freq: Every day | ORAL | Status: DC
  Administered 2014-05-23 – 2014-05-24 (×2): 1 g via ORAL
  Filled 2014-05-22 (×2): qty 1

## 2014-05-22 MED ORDER — MECLIZINE HCL 12.5 MG PO TABS: 12.5000 mg | ORAL_TABLET | Freq: Three times a day (TID) | ORAL | Status: DC | PRN

## 2014-05-22 MED ORDER — POTASSIUM CHLORIDE CRYS ER 10 MEQ PO TBCR
10.0000 meq | EXTENDED_RELEASE_TABLET | Freq: Two times a day (BID) | ORAL | Status: DC
  Administered 2014-05-22 – 2014-05-24 (×4): 10 meq via ORAL
  Filled 2014-05-22 (×4): qty 1

## 2014-05-22 MED ORDER — IBUPROFEN 600 MG PO TABS
600.0000 mg | ORAL_TABLET | Freq: Once | ORAL | Status: DC
  Filled 2014-05-22: qty 1

## 2014-05-22 MED ORDER — CLOPIDOGREL BISULFATE 75 MG PO TABS
75.0000 mg | ORAL_TABLET | Freq: Every day | ORAL | Status: DC
  Administered 2014-05-22 – 2014-05-24 (×3): 75 mg via ORAL
  Filled 2014-05-22 (×3): qty 1

## 2014-05-22 MED ORDER — LORAZEPAM 0.5 MG PO TABS: 0.5000 mg | ORAL_TABLET | Freq: Three times a day (TID) | ORAL | Status: DC | PRN

## 2014-05-22 MED ORDER — PANTOPRAZOLE SODIUM 20 MG PO TBEC
20.0000 mg | DELAYED_RELEASE_TABLET | Freq: Every day | ORAL | Status: DC
  Administered 2014-05-22 – 2014-05-24 (×3): 20 mg via ORAL
  Filled 2014-05-22 (×5): qty 1

## 2014-05-22 MED ORDER — CYCLOSPORINE 0.05 % OP EMUL
1.0000 [drp] | Freq: Two times a day (BID) | OPHTHALMIC | Status: DC
  Administered 2014-05-22: 1 [drp] via OPHTHALMIC
  Administered 2014-05-23: 10:00:00 via OPHTHALMIC
  Administered 2014-05-23 – 2014-05-24 (×2): 1 [drp] via OPHTHALMIC
  Filled 2014-05-22 (×6): qty 1

## 2014-05-22 MED ORDER — ONDANSETRON HCL 4 MG/2ML IJ SOLN
4.0000 mg | Freq: Four times a day (QID) | INTRAMUSCULAR | Status: DC | PRN
  Administered 2014-05-23: 4 mg via INTRAVENOUS
  Filled 2014-05-22: qty 2

## 2014-05-22 MED ORDER — HYDRALAZINE HCL 20 MG/ML IJ SOLN
10.0000 mg | Freq: Four times a day (QID) | INTRAMUSCULAR | Status: DC | PRN
  Administered 2014-05-22: 10 mg via INTRAVENOUS
  Filled 2014-05-22: qty 1

## 2014-05-22 MED ORDER — OMEGA-3 FATTY ACIDS 1000 MG PO CAPS: 1.0000 g | ORAL_CAPSULE | Freq: Every day | ORAL | Status: DC

## 2014-05-22 MED ORDER — STROKE: EARLY STAGES OF RECOVERY BOOK
Freq: Once | Status: AC
  Administered 2014-05-22: 23:00:00
  Filled 2014-05-22: qty 1

## 2014-05-22 NOTE — H&P (Signed)
Patient Demographics  Stacy Page, is a 80 y.o. female  MRN: [id number]   DOB - 1933-04-27  Admit Date - 05/22/2014  Outpatient Primary MD for the patient is Unice Cobble, MD   With History of -  Past Medical History  Diagnosis Date  . Hypertension   . Hyperlipidemia   . Allergy     seasonal  . Asthma   . GERD (gastroesophageal reflux disease)       Past Surgical History  Procedure Laterality Date  . Bunionectomy    . Carpal tunnel release      Bilaterally  . Abdominal hysterectomy      for fibroids (no BSO)  . Macular degeneration      Dr Zadie Rhine  ; injections  . Colonoscopy  2011    tiny polyp ; [facility]    in for   Chief Complaint  Patient presents with  . Extremity Weakness     HPI  Stacy Page  is a 80 y.o. female,  history of hypertension, dyslipidemia, asthma, GERD, asthma, currently asymptomatic spinal stenosis comes to the ER at [facility] with chief complaints of right leg tingling numbness and weakness along with some forgetfulness and confusion which started 1-1/2-2 days ago, she has noticed that her blood pressure has been running somewhat high for the last 2 days along with mild generalized headache.  She denies any fever chills, no chest pain palpitations cough phlegm shortness of breath, no abdominal pain, no blood in stool or urine, no previous history of stroke, came to the ER where her right leg pain numbness and weakness completely resolved within a few hours, however she continued to have high blood pressure, mild generalized frontal headache and some forgetfulness. Head CT was obtained which was unremarkable and I was called to admit the patient for possible TIA/CVA.    Review of Systems    In addition to the HPI above,  No Fever-chills, Mild  generalized frontal Headache, No changes with Vision or hearing, mild forgetfulness, No problems swallowing food or Liquids, No Chest pain, Cough or Shortness of Breath, No Abdominal pain, No Nausea or Vommitting, Bowel movements are regular, No Blood in stool or Urine, No dysuria, No new skin rashes or bruises, No new joints pains-aches,  No new weakness, tingling, numbness in any extremity, No recent weight gain or loss, No polyuria, polydypsia or polyphagia, No significant Mental Stressors.  A full 10 point Review of Systems was done, except as stated above, all other Review of Systems were negative.   Social History History  Substance Use Topics  . Smoking status: Former Smoker    Types: Cigarettes    Quit date: 08/24/1979  . Smokeless tobacco: Never Used     Comment: smoked  1966-1981, up to 1 ppd  . Alcohol Use: No      Family History Family History  Problem Relation Age of Onset  . Alzheimer's disease Mother   .  Hypertension Mother   . Stroke Mother 53  . Hypertension Father   . Heart attack Father     in 56's  . Hypertension Maternal Grandmother   . Heart attack Maternal Grandmother 73  . Diabetes      1/2 sister      Prior to Admission medications   Medication Sig Start Date End Date Taking? Authorizing Provider  albuterol (PROVENTIL HFA;VENTOLIN HFA) 108 (90 BASE) MCG/ACT inhaler Inhale 2 puffs into the lungs every 6 (six) hours as needed for wheezing or shortness of breath (asthma).   Yes Historical Provider, MD  Ascorbic Acid (VITAMIN C) 500 MG tablet Take 500 mg by mouth daily.     Yes Historical Provider, MD  b complex vitamins tablet Take 1 tablet by mouth daily.    Yes Historical Provider, MD  Calcium Carbonate (CALTRATE 600 PO) Take 1,200 mg by mouth daily.    Yes Historical Provider, MD  Cholecalciferol (VITAMIN D3) 2000 UNITS TABS Take 2,000 Units by mouth daily.     Yes Historical Provider, MD  co-enzyme Q-10 30 MG capsule Take 30 mg by mouth  daily.    Yes Historical Provider, MD  cycloSPORINE (RESTASIS) 0.05 % ophthalmic emulsion Place 1 drop into both eyes 2 (two) times daily.   Yes Historical Provider, MD  fexofenadine (ALLEGRA) 180 MG tablet Take 180 mg by mouth daily as needed for allergies (allergies).    Yes Historical Provider, MD  fish oil-omega-3 fatty acids 1000 MG capsule Take 1 g by mouth daily.     Yes Historical Provider, MD  fluticasone (FLONASE) 50 MCG/ACT nasal spray Place 2 sprays into both nostrils daily.   Yes Historical Provider, MD  furosemide (LASIX) 40 MG tablet Take 40 mg by mouth daily.   Yes Historical Provider, MD  gabapentin (NEURONTIN) 100 MG capsule Take 100 mg by mouth 3 (three) times daily. For leg pain   Yes Historical Provider, MD  Garlic 595 MG TABS Take 300 mg by mouth daily.     Yes Historical Provider, MD  Horse Chestnut 300 MG TABS Take 300 mg by mouth 2 (two) times daily.    Yes Historical Provider, MD  hydrALAZINE (APRESOLINE) 50 MG tablet Take 50 mg by mouth 4 (four) times daily.   Yes Historical Provider, MD  LORazepam (ATIVAN) 0.5 MG tablet Take 0.5 mg by mouth every 8 (eight) hours as needed for anxiety (anxiety).   Yes Historical Provider, MD  meclizine (ANTIVERT) 25 MG tablet Take 12.5 mg by mouth 3 (three) times daily as needed for dizziness (dizziness).   Yes Historical Provider, MD  nebivolol (BYSTOLIC) 5 MG tablet Take 5 mg by mouth 2 (two) times daily.   Yes Historical Provider, MD  olmesartan (BENICAR) 40 MG tablet Take 40 mg by mouth daily.   Yes Historical Provider, MD  pantoprazole (PROTONIX) 20 MG tablet Take 1 tablet (20 mg total) by mouth daily. 12/21/13  Yes Hendricks Limes, MD  potassium chloride (K-DUR,KLOR-CON) 10 MEQ tablet Take 10 mEq by mouth 2 (two) times daily.   Yes Historical Provider, MD    Allergies  Allergen Reactions  . Cardizem [Diltiazem Hcl]     bradycardia  . Aspirin     gastritis  . Metoclopramide Hcl Other (See Comments)    UNKNOWN  . Spironolactone      ? reaction  . Amlodipine Besylate     REACTION: edema    Physical Exam  Vitals  Blood pressure 190/80, pulse 76, temperature  98.4 F (36.9 C), temperature source Oral, resp. rate 20, height 4\' 10"  (1.473 m), weight 63.5 kg (139 lb 15.9 oz), SpO2 99.00%.   1. General elderly African American female who looks much younger than her age lying in bed in NAD,     2. Normal affect and insight, Not Suicidal or Homicidal, Awake Alert, Oriented X 3, mildly forgetful  3. No F.N deficits, ALL C.Nerves Intact, Strength 5/5 all 4 extremities, Sensation intact all 4 extremities, Plantars down going.  4. Ears and Eyes appear Normal, Conjunctivae clear, PERRLA. Moist Oral Mucosa.  5. Supple Neck, No JVD, No cervical lymphadenopathy appriciated, No Carotid Bruits.  6. Symmetrical Chest wall movement, Good air movement bilaterally, CTAB.  7. RRR, No Gallops, Rubs or Murmurs, No Parasternal Heave.  8. Positive Bowel Sounds, Abdomen Soft, No tenderness, No organomegaly appriciated,No rebound -guarding or rigidity.  9.  No Cyanosis, Normal Skin Turgor, No Skin Rash or Bruise.  10. Good muscle tone,  joints appear normal , no effusions, Normal ROM.  11. No Palpable Lymph Nodes in Neck or Axillae     Data Review  CBC  Recent Labs Lab 05/22/14 1220 05/22/14 1229  WBC 3.9*  --   HGB 13.9 16.0*  HCT 41.4 47.0*  PLT 220  --   MCV 86.6  --   MCH 29.1  --   MCHC 33.6  --   RDW 14.4  --   LYMPHSABS 1.0  --   MONOABS 0.2  --   EOSABS 0.0  --   BASOSABS 0.0  --    ------------------------------------------------------------------------------------------------------------------  Chemistries   Recent Labs Lab 05/22/14 1220 05/22/14 1229  NA 143 141  K 3.6* 3.3*  CL 103 107  CO2 24  --   GLUCOSE 98 99  BUN 13 12  CREATININE 0.83 0.90  CALCIUM 9.4  --   AST 18  --   ALT 12  --   ALKPHOS 124*  --   BILITOT 0.2*  --     ------------------------------------------------------------------------------------------------------------------ estimated creatinine clearance is 39.9 ml/min (by C-G formula based on Cr of 0.9). ------------------------------------------------------------------------------------------------------------------ No results found for this basename: TSH, T4TOTAL, FREET3, T3FREE, THYROIDAB,  in the last 72 hours   Coagulation profile  Recent Labs Lab 05/22/14 1220  INR 0.98   ------------------------------------------------------------------------------------------------------------------- No results found for this basename: DDIMER,  in the last 72 hours -------------------------------------------------------------------------------------------------------------------  Cardiac Enzymes No results found for this basename: CK, CKMB, TROPONINI, MYOGLOBIN,  in the last 168 hours ------------------------------------------------------------------------------------------------------------------ No components found with this basename: POCBNP,    ---------------------------------------------------------------------------------------------------------------  Urinalysis    Component Value Date/Time   COLORURINE YELLOW 05/22/2014 [place] 05/22/2014 1213   LABSPEC 1.012 05/22/2014 1213   PHURINE 6.5 05/22/2014 1213   GLUCOSEU NEGATIVE 05/22/2014 1213   [place] 05/22/2014 1213   [place] 05/22/2014 1213   KETONESUR NEGATIVE 05/22/2014 1213   PROTEINUR NEGATIVE 05/22/2014 1213   UROBILINOGEN 0.2 05/22/2014 1213   NITRITE NEGATIVE 05/22/2014 1213   LEUKOCYTESUR SMALL* 05/22/2014 1213    ----------------------------------------------------------------------------------------------------------------  Imaging results:   Dg Chest 2 View  05/22/2014   CLINICAL DATA:  She experienced right leg numbness with some balance trouble; plus, for a short time was somewhat  confused (couldn't remember her doctor's name). She is currently awake, alert and in no distress.Weakness. Pt has hx GERD, HTN, Asthma. Past-Smoker  EXAM: CHEST  2 VIEW  COMPARISON:  08/25/2012  FINDINGS: The heart size and mediastinal contours are within  normal limits. Both lungs are clear. The visualized skeletal structures are unremarkable.  IMPRESSION: No active cardiopulmonary disease.   Electronically Signed   By: Kathreen Devoid   On: 05/22/2014 12:54   Ct Head Wo Contrast  05/22/2014   CLINICAL DATA:  Right leg numbness.  Extremity weakness.  EXAM: CT HEAD WITHOUT CONTRAST  TECHNIQUE: Contiguous axial images were obtained from the base of the skull through the vertex without intravenous contrast.  COMPARISON:  03/12/2014  FINDINGS: There is no evidence of mass effect, midline shift, or extra-axial fluid collections. There is no evidence of a space-occupying lesion or intracranial hemorrhage. There is no evidence of a cortical-based area of acute infarction. There is generalized cerebral atrophy. There is periventricular white matter low attenuation likely secondary to microangiopathy.  The ventricles and sulci are appropriate for the patient's age. The basal cisterns are patent.  Visualized portions of the orbits are unremarkable. The visualized portions of the paranasal sinuses and mastoid air cells are unremarkable.  The osseous structures are unremarkable.  IMPRESSION: No acute intracranial pathology.   Electronically Signed   By: Kathreen Devoid   On: 05/22/2014 12:36    My personal review of EKG: Rhythm NSR, Rate  53 /min, non specificST changes    Assessment & Plan   1. Right leg tingling numbness, mild forgetfulness - delirium was resolved, she has mild generalized headache and slightly slow to answer questions which is able to do appropriately after some thought. Could be secondary to TIA/CVA, case discussed with neurologist on call Dr. Leonel Ramsay who requests transfer to [facility] for  further stroke workup.  She will be admitted on tele, obtain PT, OT, speech input, A1c and lipid panel, check MRI MRA brain, echogram, carotid duplex, since aspirin allergy will be placed on Plavix. Plan discussed with neurology. We'll see the patient when she arrives to [facility].    2. Hypertension. For now we'll allow for permissive hypertension, as needed IV hydralazine.    3. Dyslipidemia. Continue fish oil. Check lipid panel.    4. GERD. PPI.    5. Low potassium. Replace.    6. History of spinal stenosis. No acute issues, currently not having any pain or discomfort.    7. Sinus bradycardia with nonspecific EKG changes. Hold bystolic, telemetry monitor, cycle troponin get echogram.    8.History of asthma. No acute issues as needed nebulizer treatments and oxygen.      DVT Prophylaxis Heparin - SCDs   AM Labs Ordered, also please review Full Orders  Family Communication: Admission, patients condition and plan of care including tests being ordered have been discussed with the patient and   indicates understanding and agree with the plan and Code Status.  Code Status full  Likely DC to  home  Condition GUARDED   Time spent in minutes : 35    Elvenia Godden K M.D on 05/22/2014 at 4:55 PM  Between 7am to 7pm - Pager - [phone]  After 7pm go to [email or web address] - password [email or web address]  And look for the night coverage person covering me after hours  [facility] Office  [phone]   **Disclaimer: This note may have been dictated with voice recognition software. Similar sounding words can inadvertently be transcribed and this note may contain transcription errors which may not have been corrected upon publication of note.**

## 2014-05-22 NOTE — ED Notes (Signed)
Spoke with Dr. Alvino Chapel regarding bp meds.  Meds ordered

## 2014-05-22 NOTE — ED Notes (Signed)
She experienced right leg numbness with some balance trouble; plus, for a short time was somewhat confused (couldn't remember her doctor's name).  She is currently awake, alert and in no distress.

## 2014-05-22 NOTE — Consult Note (Signed)
Referring Physician: [facility], Mamie Nick    Chief Complaint: New onset speech output difficulty and right-sided weakness and numbness.  HPI: Stacy Page is an 80 y.o. female history of hypertension and hyperlipidemia who developed speech output difficulty along with weakness and numbness involving right upper and lower extremities earlier this morning. She was last well at 6:30 AM. Patient in which he wanted to say, but couldn't get the words out. She had no problems understanding what was being said to her. She complained of weakness and numbness involving right upper and lower extremities. Deficits improved while she was in the emergency room and had resolved by the time she was transferred to [facility]. There's no previous history of stroke or TIA. CT scan of the head showed no acute intracranial abnormality. She has not been on antiplatelet therapy. NIH stroke score at the time of this evaluation was 0.  LSN: 6:30 AM on 05/22/2014 tPA Given: No: Rapidly resolving deficits mRankin:  Past Medical History  Diagnosis Date  . Hypertension   . Hyperlipidemia   . Allergy     seasonal  . Asthma   . GERD (gastroesophageal reflux disease)     Family History  Problem Relation Age of Onset  . Alzheimer's disease Mother   . Hypertension Mother   . Stroke Mother 34  . Hypertension Father   . Heart attack Father     in 71's  . Hypertension Maternal Grandmother   . Heart attack Maternal Grandmother 73  . Diabetes      1/2 sister     Medications: I have reviewed the patient's current medications.  ROS: History obtained from patient's daughters and the patient  General ROS: negative for - chills, fatigue, fever, night sweats, weight gain or weight loss Psychological ROS: negative for - behavioral disorder, hallucinations, memory difficulties, mood swings or suicidal ideation Ophthalmic ROS: negative for - blurry vision, double vision, eye pain or loss of vision ENT ROS: negative for - epistaxis, nasal  discharge, oral lesions, sore throat, tinnitus or vertigo Allergy and Immunology ROS: negative for - hives or itchy/watery eyes Hematological and Lymphatic ROS: negative for - bleeding problems, bruising or swollen lymph nodes Endocrine ROS: negative for - galactorrhea, hair pattern changes, polydipsia/polyuria or temperature intolerance Respiratory ROS: negative for - cough, hemoptysis, shortness of breath or wheezing Cardiovascular ROS: negative for - chest pain, dyspnea on exertion, edema or irregular heartbeat Gastrointestinal ROS: negative for - abdominal pain, diarrhea, hematemesis, nausea/vomiting or stool incontinence Genito-Urinary ROS: negative for - dysuria, hematuria, incontinence or urinary frequency/urgency Musculoskeletal ROS: negative for - joint swelling or muscular weakness Neurological ROS: as noted in HPI Dermatological ROS: negative for rash and skin lesion changes  Physical Examination: Blood pressure 172/87, pulse 74, temperature 98.4 F (36.9 C), temperature source Oral, resp. rate 20, height 4\' 10"  (1.473 m), weight 65.318 kg (144 lb), SpO2 99.00%.  Neurologic Examination: Mental Status: Alert, oriented, thought content appropriate.  Speech fluent without evidence of aphasia. Able to follow commands without difficulty. Cranial Nerves: II-Visual fields were normal. III/IV/VI-Pupils were equal and reacted. Extraocular movements were full and conjugate.    V/VII-no facial numbness and no facial weakness. VIII-normal. X-normal speech and symmetrical palatal movement. Motor: 5/5 bilaterally with normal tone and bulk Sensory: Normal throughout. Deep Tendon Reflexes: 2+ and symmetric. Plantars: Flexor bilaterally Cerebellar: Normal finger-to-nose testing. Carotid auscultation: Normal  Dg Chest 2 View  05/22/2014   CLINICAL DATA:  She experienced right leg numbness with some balance trouble; plus, for a  short time was somewhat confused (couldn't remember her doctor's  name). She is currently awake, alert and in no distress.Weakness. Pt has hx GERD, HTN, Asthma. Past-Smoker  EXAM: CHEST  2 VIEW  COMPARISON:  08/25/2012  FINDINGS: The heart size and mediastinal contours are within normal limits. Both lungs are clear. The visualized skeletal structures are unremarkable.  IMPRESSION: No active cardiopulmonary disease.   Electronically Signed   By: Kathreen Devoid   On: 05/22/2014 12:54   Ct Head Wo Contrast  05/22/2014   CLINICAL DATA:  Right leg numbness.  Extremity weakness.  EXAM: CT HEAD WITHOUT CONTRAST  TECHNIQUE: Contiguous axial images were obtained from the base of the skull through the vertex without intravenous contrast.  COMPARISON:  03/12/2014  FINDINGS: There is no evidence of mass effect, midline shift, or extra-axial fluid collections. There is no evidence of a space-occupying lesion or intracranial hemorrhage. There is no evidence of a cortical-based area of acute infarction. There is generalized cerebral atrophy. There is periventricular white matter low attenuation likely secondary to microangiopathy.  The ventricles and sulci are appropriate for the patient's age. The basal cisterns are patent.  Visualized portions of the orbits are unremarkable. The visualized portions of the paranasal sinuses and mastoid air cells are unremarkable.  The osseous structures are unremarkable.  IMPRESSION: No acute intracranial pathology.   Electronically Signed   By: Kathreen Devoid   On: 05/22/2014 12:36    Assessment: 80 y.o. female probable left MCA territory transient ischemic attack with resolution of deficits clinically. However, small ischemic infarction cannot be ruled out.  Stroke Risk Factors - family history, hyperlipidemia and hypertension  Plan: 1. HgbA1c, fasting lipid panel 2. MRI, MRA  of the brain without contrast 3. PT consult, OT consult, Speech consult 4. Echocardiogram 5. Carotid dopplers 6. Prophylactic therapy-Antiplatelet med: Aspirin 81 mg coated  tablet daily 7. Risk factor modification 8. Telemetry monitoring   C.R. Nicole Kindred, MD [facility] [phone]  05/22/2014, 8:21 PM

## 2014-05-22 NOTE — Progress Notes (Signed)
Patient arrived to 4N24 AAOx4. Vital signs were taken, tele placed and call bell by her side. Questions were answered. Complaining of a headache. Physician at the bedside. Will continue to monitor. Noboru Bidinger, Rande Brunt

## 2014-05-22 NOTE — Progress Notes (Signed)
Called to give report to the receiving nurse two separate times. Ambulance is here to take patient and the I was told to still call back for report in 10 mins.  Will try again.

## 2014-05-22 NOTE — ED Provider Notes (Signed)
CSN: [id number]     Arrival date & time 05/22/14  1132 History   First MD Initiated Contact with Patient 05/22/14 1143     Chief Complaint  Patient presents with  . Extremity Weakness     (Consider location/radiation/quality/duration/timing/severity/associated sxs/prior Treatment) Patient is a 80 y.o. female presenting with extremity weakness. The history is provided by the patient.  Extremity Weakness This is a new problem. Pertinent negatives include no chest pain, no abdominal pain, no headaches and no shortness of breath.  patient woke up this morning and was normal. At around 9:30 this morning she developed some difficulty walking and difficulty using her right leg. She reportedly was also having some difficulty speaking and perhaps mild confusion. No headache. The symptoms have since improved somewhat. No previous history of stroke. No chest pain. She was reportedly having some trouble walking at that time. But now feels as if it has gotten much better.  Past Medical History  Diagnosis Date  . Hypertension   . Hyperlipidemia   . Allergy     seasonal  . Asthma   . GERD (gastroesophageal reflux disease)    Past Surgical History  Procedure Laterality Date  . Bunionectomy    . Carpal tunnel release      Bilaterally  . Abdominal hysterectomy      for fibroids (no BSO)  . Macular degeneration      Dr Zadie Rhine  ; injections  . Colonoscopy  2011    tiny polyp ; [facility]   Family History  Problem Relation Age of Onset  . Alzheimer's disease Mother   . Hypertension Mother   . Stroke Mother 53  . Hypertension Father   . Heart attack Father     in 64's  . Hypertension Maternal Grandmother   . Heart attack Maternal Grandmother 73  . Diabetes      1/2 sister   History  Substance Use Topics  . Smoking status: Former Smoker    Types: Cigarettes    Quit date: 08/24/1979  . Smokeless tobacco: Never Used     Comment: smoked  1966-1981, up to 1 ppd  . Alcohol Use: No   OB  History   Grav Para Term Preterm Abortions TAB SAB Ect Mult Living                 Review of Systems  Constitutional: Negative for activity change and appetite change.  Eyes: Negative for pain and visual disturbance.  Respiratory: Negative for chest tightness and shortness of breath.   Cardiovascular: Negative for chest pain and leg swelling.  Gastrointestinal: Negative for nausea, vomiting, abdominal pain and diarrhea.  Genitourinary: Negative for flank pain.  Musculoskeletal: Positive for extremity weakness and gait problem. Negative for back pain and neck stiffness.  Skin: Negative for rash.  Neurological: Positive for speech difficulty and numbness. Negative for weakness and headaches.  Psychiatric/Behavioral: Negative for behavioral problems.      Allergies  Cardizem; Aspirin; Metoclopramide hcl; Spironolactone; and Amlodipine besylate  Home Medications   Prior to Admission medications   Medication Sig Start Date End Date Taking? Authorizing Provider  albuterol (PROVENTIL HFA;VENTOLIN HFA) 108 (90 BASE) MCG/ACT inhaler Inhale 2 puffs into the lungs every 6 (six) hours as needed for wheezing or shortness of breath (asthma).   Yes Historical Provider, MD  Ascorbic Acid (VITAMIN C) 500 MG tablet Take 500 mg by mouth daily.     Yes Historical Provider, MD  b complex vitamins tablet Take 1 tablet  by mouth daily.    Yes Historical Provider, MD  Calcium Carbonate (CALTRATE 600 PO) Take 1,200 mg by mouth daily.    Yes Historical Provider, MD  Cholecalciferol (VITAMIN D3) 2000 UNITS TABS Take 2,000 Units by mouth daily.     Yes Historical Provider, MD  co-enzyme Q-10 30 MG capsule Take 30 mg by mouth daily.    Yes Historical Provider, MD  cycloSPORINE (RESTASIS) 0.05 % ophthalmic emulsion Place 1 drop into both eyes 2 (two) times daily.   Yes Historical Provider, MD  fexofenadine (ALLEGRA) 180 MG tablet Take 180 mg by mouth daily as needed for allergies (allergies).    Yes Historical  Provider, MD  fish oil-omega-3 fatty acids 1000 MG capsule Take 1 g by mouth daily.     Yes Historical Provider, MD  fluticasone (FLONASE) 50 MCG/ACT nasal spray Place 2 sprays into both nostrils daily.   Yes Historical Provider, MD  furosemide (LASIX) 40 MG tablet Take 40 mg by mouth daily.   Yes Historical Provider, MD  gabapentin (NEURONTIN) 100 MG capsule Take 100 mg by mouth 3 (three) times daily. For leg pain   Yes Historical Provider, MD  Garlic 875 MG TABS Take 300 mg by mouth daily.     Yes Historical Provider, MD  Horse Chestnut 300 MG TABS Take 300 mg by mouth 2 (two) times daily.    Yes Historical Provider, MD  hydrALAZINE (APRESOLINE) 50 MG tablet Take 50 mg by mouth 4 (four) times daily.   Yes Historical Provider, MD  LORazepam (ATIVAN) 0.5 MG tablet Take 0.5 mg by mouth every 8 (eight) hours as needed for anxiety (anxiety).   Yes Historical Provider, MD  meclizine (ANTIVERT) 25 MG tablet Take 12.5 mg by mouth 3 (three) times daily as needed for dizziness (dizziness).   Yes Historical Provider, MD  nebivolol (BYSTOLIC) 5 MG tablet Take 5 mg by mouth 2 (two) times daily.   Yes Historical Provider, MD  olmesartan (BENICAR) 40 MG tablet Take 40 mg by mouth daily.   Yes Historical Provider, MD  pantoprazole (PROTONIX) 20 MG tablet Take 1 tablet (20 mg total) by mouth daily. 12/21/13  Yes Hendricks Limes, MD  potassium chloride (K-DUR,KLOR-CON) 10 MEQ tablet Take 10 mEq by mouth 2 (two) times daily.   Yes Historical Provider, MD   BP 188/82  Pulse 51  Temp(Src) 98.5 F (36.9 C) (Oral)  Resp 19  SpO2 96% Physical Exam  Nursing note and vitals reviewed. Constitutional: She is oriented to person, place, and time. She appears well-developed and well-nourished.  HENT:  Head: Normocephalic and atraumatic.  Eyes: EOM are normal. Pupils are equal, round, and reactive to light.  Neck: Normal range of motion. Neck supple.  Cardiovascular: Normal rate, regular rhythm and normal heart sounds.    No murmur heard. Pulmonary/Chest: Effort normal and breath sounds normal. No respiratory distress. She has no wheezes. She has no rales.  Abdominal: Soft. Bowel sounds are normal. She exhibits no distension. There is no tenderness. There is no rebound and no guarding.  Musculoskeletal: Normal range of motion.  Neurological: She is alert and oriented to person, place, and time. No cranial nerve deficit.  Finger-nose intact bilaterally. Extraocular movements intact. Good grip strength bilaterally. Negative Romberg. Normal ambulation. Good flexion extension and knees and ankles and bilateral lower extremities.  Skin: Skin is warm and dry.  Psychiatric: She has a normal mood and affect. Her speech is normal.    ED Course  Procedures (including  critical care time) Labs Review Labs Reviewed  CBC - Abnormal; Notable for the following:    WBC 3.9 (*)    All other components within normal limits  COMPREHENSIVE METABOLIC PANEL - Abnormal; Notable for the following:    Potassium 3.6 (*)    Alkaline Phosphatase 124 (*)    Total Bilirubin 0.2 (*)    GFR calc non Af Amer 65 (*)    GFR calc Af Amer 76 (*)    Anion gap 16 (*)    All other components within normal limits  URINALYSIS, ROUTINE W REFLEX MICROSCOPIC - Abnormal; Notable for the following:    Leukocytes, UA SMALL (*)    All other components within normal limits  I-STAT CHEM 8, ED - Abnormal; Notable for the following:    Potassium 3.3 (*)    Hemoglobin 16.0 (*)    HCT 47.0 (*)    All other components within normal limits  ETHANOL  PROTIME-INR  APTT  DIFFERENTIAL  URINE RAPID DRUG SCREEN (HOSP PERFORMED)  URINE MICROSCOPIC-ADD ON  I-STAT TROPOININ, ED    Imaging Review Dg Chest 2 View  05/22/2014   CLINICAL DATA:  She experienced right leg numbness with some balance trouble; plus, for a short time was somewhat confused (couldn't remember her doctor's name). She is currently awake, alert and in no distress.Weakness. Pt has hx  GERD, HTN, Asthma. Past-Smoker  EXAM: CHEST  2 VIEW  COMPARISON:  08/25/2012  FINDINGS: The heart size and mediastinal contours are within normal limits. Both lungs are clear. The visualized skeletal structures are unremarkable.  IMPRESSION: No active cardiopulmonary disease.   Electronically Signed   By: Kathreen Devoid   On: 05/22/2014 12:54   Ct Head Wo Contrast  05/22/2014   CLINICAL DATA:  Right leg numbness.  Extremity weakness.  EXAM: CT HEAD WITHOUT CONTRAST  TECHNIQUE: Contiguous axial images were obtained from the base of the skull through the vertex without intravenous contrast.  COMPARISON:  03/12/2014  FINDINGS: There is no evidence of mass effect, midline shift, or extra-axial fluid collections. There is no evidence of a space-occupying lesion or intracranial hemorrhage. There is no evidence of a cortical-based area of acute infarction. There is generalized cerebral atrophy. There is periventricular white matter low attenuation likely secondary to microangiopathy.  The ventricles and sulci are appropriate for the patient's age. The basal cisterns are patent.  Visualized portions of the orbits are unremarkable. The visualized portions of the paranasal sinuses and mastoid air cells are unremarkable.  The osseous structures are unremarkable.  IMPRESSION: No acute intracranial pathology.   Electronically Signed   By: Kathreen Devoid   On: 05/22/2014 12:36     EKG Interpretation   Date/Time:  Wednesday May 22 2014 12:12:13 EDT Ventricular Rate:  53 PR Interval:  124 QRS Duration: 92 QT Interval:  406 QTC Calculation: 381 R Axis:   -9 Text Interpretation:  Sinus rhythm Borderline T abnormalities, anterior  leads Baseline wander in lead(s) V1 V3 Confirmed by Alvino Chapel  MD, Ovid Curd  [phone]) on 05/22/2014 2:53:27 PM      MDM   Final diagnoses:  Weakness of lower extremity, unspecified laterality  Accelerated hypertension    Patient presented with complaints of weakness and some  difficulty walking.   symptoms have resolved upon my examination. not a TPA candidate. Also had hypertension. Will give hydralazine. I do not on a lower the blood pressure too quickly. Will admit to internal medicine   Jasper Riling. Alvino Chapel, MD 05/22/14  1539 

## 2014-05-23 DIAGNOSIS — I951 Orthostatic hypotension: Secondary | ICD-10-CM | POA: Diagnosis not present

## 2014-05-23 DIAGNOSIS — I498 Other specified cardiac arrhythmias: Secondary | ICD-10-CM | POA: Diagnosis not present

## 2014-05-23 DIAGNOSIS — Z6830 Body mass index (BMI) 30.0-30.9, adult: Secondary | ICD-10-CM | POA: Diagnosis not present

## 2014-05-23 DIAGNOSIS — R29898 Other symptoms and signs involving the musculoskeletal system: Secondary | ICD-10-CM

## 2014-05-23 DIAGNOSIS — Z87891 Personal history of nicotine dependence: Secondary | ICD-10-CM | POA: Diagnosis not present

## 2014-05-23 DIAGNOSIS — J45909 Unspecified asthma, uncomplicated: Secondary | ICD-10-CM | POA: Diagnosis not present

## 2014-05-23 DIAGNOSIS — Z79899 Other long term (current) drug therapy: Secondary | ICD-10-CM | POA: Diagnosis not present

## 2014-05-23 DIAGNOSIS — E785 Hyperlipidemia, unspecified: Secondary | ICD-10-CM | POA: Diagnosis not present

## 2014-05-23 DIAGNOSIS — G458 Other transient cerebral ischemic attacks and related syndromes: Secondary | ICD-10-CM | POA: Insufficient documentation

## 2014-05-23 DIAGNOSIS — K219 Gastro-esophageal reflux disease without esophagitis: Secondary | ICD-10-CM | POA: Diagnosis not present

## 2014-05-23 DIAGNOSIS — G459 Transient cerebral ischemic attack, unspecified: Secondary | ICD-10-CM

## 2014-05-23 DIAGNOSIS — I10 Essential (primary) hypertension: Secondary | ICD-10-CM | POA: Diagnosis not present

## 2014-05-23 DIAGNOSIS — G5622 Lesion of ulnar nerve, left upper limb: Secondary | ICD-10-CM | POA: Diagnosis not present

## 2014-05-23 DIAGNOSIS — R4181 Age-related cognitive decline: Secondary | ICD-10-CM | POA: Diagnosis not present

## 2014-05-23 DIAGNOSIS — E669 Obesity, unspecified: Secondary | ICD-10-CM | POA: Diagnosis not present

## 2014-05-23 DIAGNOSIS — R531 Weakness: Secondary | ICD-10-CM | POA: Diagnosis present

## 2014-05-23 DIAGNOSIS — M4800 Spinal stenosis, site unspecified: Secondary | ICD-10-CM | POA: Diagnosis not present

## 2014-05-23 LAB — CBC
HCT: 36.2 % (ref 36.0–46.0)
HEMOGLOBIN: 12.5 g/dL (ref 12.0–15.0)
MCH: 28.9 pg (ref 26.0–34.0)
MCHC: 34.5 g/dL (ref 30.0–36.0)
MCV: 83.8 fL (ref 78.0–100.0)
Platelets: 219 10*3/uL (ref 150–400)
RBC: 4.32 MIL/uL (ref 3.87–5.11)
RDW: 14.1 % (ref 11.5–15.5)
WBC: 4.6 10*3/uL (ref 4.0–10.5)

## 2014-05-23 LAB — HEMOGLOBIN A1C
HEMOGLOBIN A1C: 5.7 % — AB (ref <5.7)
Mean Plasma Glucose: 117 mg/dL — ABNORMAL HIGH (ref <117)

## 2014-05-23 LAB — LIPID PANEL
CHOLESTEROL: 253 mg/dL — AB (ref 0–200)
HDL: 54 mg/dL (ref >39)
LDL Cholesterol: 188 mg/dL — ABNORMAL HIGH (ref 0–99)
Total CHOL/HDL Ratio: 4.7 RATIO
Triglycerides: 56 mg/dL (ref <150)
VLDL: 11 mg/dL (ref 0–40)

## 2014-05-23 LAB — BASIC METABOLIC PANEL
Anion gap: 13 (ref 5–15)
BUN: 10 mg/dL (ref 6–23)
CHLORIDE: 108 meq/L (ref 96–112)
CO2: 21 meq/L (ref 19–32)
CREATININE: 0.72 mg/dL (ref 0.50–1.10)
Calcium: 9 mg/dL (ref 8.4–10.5)
GFR calc Af Amer: >90 mL/min (ref >90)
GFR calc non Af Amer: 80 mL/min — ABNORMAL LOW (ref >90)
Glucose, Bld: 91 mg/dL (ref 70–99)
Potassium: 3.9 mEq/L (ref 3.7–5.3)
Sodium: 142 mEq/L (ref 137–147)

## 2014-05-23 LAB — TROPONIN I: Troponin I: <0.3 ng/mL (ref <0.30)

## 2014-05-23 MED ORDER — ATORVASTATIN CALCIUM 40 MG PO TABS
40.0000 mg | ORAL_TABLET | Freq: Every day | ORAL | Status: DC
  Administered 2014-05-23: 40 mg via ORAL
  Filled 2014-05-23: qty 1

## 2014-05-23 NOTE — Progress Notes (Signed)
[facility] PROGRESS NOTE  Stacy Page LKG:[id number] DOB: 07-Apr-1933 DOA: 05/22/2014 PCP: Unice Cobble, MD  Assessment/Plan: Principal Problem:   Acute anterior circulation TIA Active Problems:   HYPERLIPIDEMIA   ULNAR NEUROPATHY, LEFT   HYPERTENSION, ESSENTIAL NOS   HYPOTENSION-ORTHOSTATIC   ASTHMA   GERD   SPINAL STENOSIS   Weakness of lower extremity   Leg weakness    L brain TIA of anterior cerebral artery  MRI No acute stroke  MRA Cerebral atherosclerosis  Carotid Doppler pending  2D Echo pending  no antithrombotics prior to admission, now on clopidogrel 75 mg orally every day. Continue Plavix at discharge.  HgbA1c 5.7 WNL  Heparin 5000 units sq tid for VTE prophylaxis  Therapy recommendations: Pending, doubt there will be any  Anticipate d/c home in a.m. after workup is completed  Hypertension  BP goal long term normotensive BP 145-206/77-101 past 24h (05/23/2014 @ 11:06 AM) Stable  Continue hydralazine, bystolic  Hyperlipidemia  Home meds: Fish oil, resumed in hospital.  Started on lipitor 40 mg daily today LDL 188, goal < 100  Continue statin at discharge  Other Stroke Risk Factors  Advanced age  Former Cigarette smoker, stopped 40 years ago Obesity, Body mass index is 30.48 kg/(m^2).  Family hx stroke (mother)     Code Status: full Family Communication: family updated about patient's clinical progress Disposition Plan:  As above    Brief narrative: 80 y.o. female history of hypertension and hyperlipidemia who developed speech output difficulty along with weakness and numbness involving right upper and lower extremities earlier this morning. She was last well at 6:30 AM 05/22/2014. Patient in which he wanted to say, but couldn't get the words out. She had no problems understanding what was being said to her. She complained of weakness and numbness involving right upper and lower extremities. Deficits improved while she was in the emergency  room and had resolved by the time she was transferred to [facility]. There's no previous history of stroke or TIA. CT scan of the head showed no acute intracranial abnormality. She has not been on antiplatelet therapy. NIH stroke score at the time of this evaluation was 0.  Patient was not administered TPA secondary to rapidly resolving deficits. She was admitted for further evaluation and treatment.   Consultants:    Neurology  Procedures: None  Antibiotics: None  HPI/Subjective: Her daughter is at the bedside. Overall she feels her condition is completely resolved   Objective: Filed Vitals:   05/23/14 0300 05/23/14 0454 05/23/14 0456 05/23/14 0657  BP: 179/78 168/65  183/79  Pulse: 64 60  66  Temp: 98.2 F (36.8 C) 98.2 F (36.8 C)  98.2 F (36.8 C)  TempSrc: Oral Oral  Oral  Resp: 16 16  16   Height:      Weight:   66.134 kg (145 lb 12.8 oz)   SpO2: 98% 96%  99%   No intake or output data in the 24 hours ending 05/23/14 1205  Exam:  General: alert & oriented x 3 In NAD  Cardiovascular: RRR, nl S1 s2  Respiratory: Decreased breath sounds at the bases, scattered rhonchi, no crackles  Abdomen: soft +BS NT/ND, no masses palpable  Extremities: No cyanosis and no edema      Data Reviewed: Basic Metabolic Panel:  Recent Labs Lab 05/22/14 1220 05/22/14 1229 05/22/14 2255 05/23/14 0117  NA 143 141  --  142  K 3.6* 3.3*  --  3.9  CL 103 107  --  108  CO2 24  --   --  21  GLUCOSE 98 99  --  91  BUN 13 12  --  10  CREATININE 0.83 0.90 0.73 0.72  CALCIUM 9.4  --   --  9.0    Liver Function Tests:  Recent Labs Lab 05/22/14 1220  AST 18  ALT 12  ALKPHOS 124*  BILITOT 0.2*  PROT 7.9  ALBUMIN 4.0   No results found for this basename: LIPASE, AMYLASE,  in the last 168 hours No results found for this basename: AMMONIA,  in the last 168 hours  CBC:  Recent Labs Lab 05/22/14 1220 05/22/14 1229 05/22/14 2255 05/23/14 0117  WBC 3.9*  --  4.5 4.6   NEUTROABS 2.6  --   --   --   HGB 13.9 16.0* 12.7 12.5  HCT 41.4 47.0* 37.1 36.2  MCV 86.6  --  83.9 83.8  PLT 220  --  230 219    Cardiac Enzymes:  Recent Labs Lab 05/22/14 2255 05/23/14 0117 05/23/14 1008  TROPONINI <0.30 <0.30 <0.30   BNP (last 3 results) No results found for this basename: PROBNP,  in the last 8760 hours   CBG: No results found for this basename: GLUCAP,  in the last 168 hours  No results found for this or any previous visit (from the past 240 hour(s)).   Studies: Dg Chest 2 View  05/22/2014   CLINICAL DATA:  She experienced right leg numbness with some balance trouble; plus, for a short time was somewhat confused (couldn't remember her doctor's name). She is currently awake, alert and in no distress.Weakness. Pt has hx GERD, HTN, Asthma. Past-Smoker  EXAM: CHEST  2 VIEW  COMPARISON:  08/25/2012  FINDINGS: The heart size and mediastinal contours are within normal limits. Both lungs are clear. The visualized skeletal structures are unremarkable.  IMPRESSION: No active cardiopulmonary disease.   Electronically Signed   By: Kathreen Devoid   On: 05/22/2014 12:54   Ct Head Wo Contrast  05/22/2014   CLINICAL DATA:  Right leg numbness.  Extremity weakness.  EXAM: CT HEAD WITHOUT CONTRAST  TECHNIQUE: Contiguous axial images were obtained from the base of the skull through the vertex without intravenous contrast.  COMPARISON:  03/12/2014  FINDINGS: There is no evidence of mass effect, midline shift, or extra-axial fluid collections. There is no evidence of a space-occupying lesion or intracranial hemorrhage. There is no evidence of a cortical-based area of acute infarction. There is generalized cerebral atrophy. There is periventricular white matter low attenuation likely secondary to microangiopathy.  The ventricles and sulci are appropriate for the patient's age. The basal cisterns are patent.  Visualized portions of the orbits are unremarkable. The visualized portions of  the paranasal sinuses and mastoid air cells are unremarkable.  The osseous structures are unremarkable.  IMPRESSION: No acute intracranial pathology.   Electronically Signed   By: Kathreen Devoid   On: 05/22/2014 12:36   Mr Brain Wo Contrast  05/22/2014   CLINICAL DATA:  RIGHT leg tingling, numbness, weakness. Forgetfulness and confusion beginning 2 days ago. Hypertension. History of asymptomatic spinal stenosis.  EXAM: MRI HEAD WITHOUT CONTRAST  MRA HEAD WITHOUT CONTRAST  TECHNIQUE: Multiplanar, multiecho pulse sequences of the brain and surrounding structures were obtained without intravenous contrast. Angiographic images of the head were obtained using MRA technique without contrast.  COMPARISON:  CT of the head May 22, 2014  FINDINGS: MRI HEAD FINDINGS  No reduced diffusion to suggest acute ischemia. No susceptibility  artifact to suggest hemorrhage.  Mild ventriculomegaly, likely on the basis of global parenchymal brain volume loss as there is overall commensurate enlargement of cerebral sulci and cerebellar folia. Symmetric confluent supratentorial white matter T2 hyperintensities without mass effect. No midline shift or mass effect.  No abnormal extra-axial fluid collections. Major intracranial vascular flow voids better characterized on MRA. The dolichoectatic appearance the intracranial vessels.  Paranasal sinuses and mastoid air cells appear well-aerated. Patient is edentulous. Ocular globes and orbital contents are unremarkable. Fluid replaced not expanded sella. No cerebellar tonsillar ectopia. Moderate cervical spondylosis partially imaged. No suspicious bone marrow signal.  MRA HEAD FINDINGS  Moderately motion degraded examination.  Anterior circulation: Normal flow related enhancement of cervical, petrous, cavernous and supra clinoid internal carotid arteries. Patent communicating artery with normal flow related enhancement air anterior and middle cerebral arteries.  Posterior circulation: LEFT  vertebral artery is dominant. Normal flow related enhancement of the basilar artery with some tortuosity, and at least mild to moderate distal basilar artery narrowing. Diminutive LEFT P1 segment with robust LEFT posterior communicating artery providing the dominant vascular supply. Patent posterior cerebral arteries.  No large vessel occlusion. No definite aneurysm the motion degrades sensitivity. Moderate luminal irregularity of the intracranial vessels likely atherosclerosis.  IMPRESSION: MRI head:  No acute intracranial process.  Severe white matter changes suggest sequelae of chronic small vessel ischemic disease. Dolicoectatic intracranial vessels can be seen with chronic hypertension.  MRA head: Moderately motion degraded examination. Luminal irregularity of the intracranial vessels suggests atherosclerosis with at least mild to moderate distal basilar artery stenosis.   Electronically Signed   By: Elon Alas   On: 05/22/2014 23:58   Mr Jodene Nam Head/brain Wo Cm  05/22/2014   CLINICAL DATA:  RIGHT leg tingling, numbness, weakness. Forgetfulness and confusion beginning 2 days ago. Hypertension. History of asymptomatic spinal stenosis.  EXAM: MRI HEAD WITHOUT CONTRAST  MRA HEAD WITHOUT CONTRAST  TECHNIQUE: Multiplanar, multiecho pulse sequences of the brain and surrounding structures were obtained without intravenous contrast. Angiographic images of the head were obtained using MRA technique without contrast.  COMPARISON:  CT of the head May 22, 2014  FINDINGS: MRI HEAD FINDINGS  No reduced diffusion to suggest acute ischemia. No susceptibility artifact to suggest hemorrhage.  Mild ventriculomegaly, likely on the basis of global parenchymal brain volume loss as there is overall commensurate enlargement of cerebral sulci and cerebellar folia. Symmetric confluent supratentorial white matter T2 hyperintensities without mass effect. No midline shift or mass effect.  No abnormal extra-axial fluid  collections. Major intracranial vascular flow voids better characterized on MRA. The dolichoectatic appearance the intracranial vessels.  Paranasal sinuses and mastoid air cells appear well-aerated. Patient is edentulous. Ocular globes and orbital contents are unremarkable. Fluid replaced not expanded sella. No cerebellar tonsillar ectopia. Moderate cervical spondylosis partially imaged. No suspicious bone marrow signal.  MRA HEAD FINDINGS  Moderately motion degraded examination.  Anterior circulation: Normal flow related enhancement of cervical, petrous, cavernous and supra clinoid internal carotid arteries. Patent communicating artery with normal flow related enhancement air anterior and middle cerebral arteries.  Posterior circulation: LEFT vertebral artery is dominant. Normal flow related enhancement of the basilar artery with some tortuosity, and at least mild to moderate distal basilar artery narrowing. Diminutive LEFT P1 segment with robust LEFT posterior communicating artery providing the dominant vascular supply. Patent posterior cerebral arteries.  No large vessel occlusion. No definite aneurysm the motion degrades sensitivity. Moderate luminal irregularity of the intracranial vessels likely atherosclerosis.  IMPRESSION: MRI head:  No acute intracranial process.  Severe white matter changes suggest sequelae of chronic small vessel ischemic disease. Dolicoectatic intracranial vessels can be seen with chronic hypertension.  MRA head: Moderately motion degraded examination. Luminal irregularity of the intracranial vessels suggests atherosclerosis with at least mild to moderate distal basilar artery stenosis.   Electronically Signed   By: Elon Alas   On: 05/22/2014 23:58    Scheduled Meds: . atorvastatin  40 mg Oral q1800  . clopidogrel  75 mg Oral Daily  . cycloSPORINE  1 drop Both Eyes BID  . fluticasone  2 spray Each Nare Daily  . furosemide  40 mg Oral Daily  . heparin  5,000 Units  Subcutaneous 3 times per day  . ibuprofen  600 mg Oral Once  . omega-3 acid ethyl esters  1 g Oral Daily  . pantoprazole  20 mg Oral Daily  . potassium chloride  10 mEq Oral BID  . sodium chloride  3 mL Intravenous Q12H   Continuous Infusions:   Principal Problem:   Acute anterior circulation TIA Active Problems:   HYPERLIPIDEMIA   ULNAR NEUROPATHY, LEFT   HYPERTENSION, ESSENTIAL NOS   HYPOTENSION-ORTHOSTATIC   ASTHMA   GERD   SPINAL STENOSIS   Weakness of lower extremity   Leg weakness    Time spent: 40 minutes   [facility] Pager [phone]. If 7PM-7AM, please contact night-coverage at [email or web address], password [facility] 05/23/2014, 12:05 PM  LOS: 1 day

## 2014-05-23 NOTE — Progress Notes (Signed)
STROKE TEAM PROGRESS NOTE   HISTORY Stacy Page is an 80 y.o. female history of hypertension and hyperlipidemia who developed speech output difficulty along with weakness and numbness involving right upper and lower extremities earlier this morning. She was last well at 6:30 AM 05/22/2014. Patient in which he wanted to say, but couldn't get the words out. She had no problems understanding what was being said to her. She complained of weakness and numbness involving right upper and lower extremities. Deficits improved while she was in the emergency room and had resolved by the time she was transferred to [facility]. There's no previous history of stroke or TIA. CT scan of the head showed no acute intracranial abnormality. She has not been on antiplatelet therapy. NIH stroke score at the time of this evaluation was 0.  Patient was not administered TPA secondary to rapidly resolving deficits. She was admitted for further evaluation and treatment.   SUBJECTIVE (INTERVAL HISTORY) Her daughter is at the bedside.  Overall she feels her condition is completely resolved. She is retired. She stated that she woke up with LLE weakness and difficult get words out. Daughter said she had garbled speech. Resolved in 4-5 hours. Never had similar problems in the past. Quit smoking and alcohol 35 years ago.    OBJECTIVE Temp:  [98.2 F (36.8 C)-99.1 F (37.3 C)] 98.2 F (36.8 C) (10/01 0657) Pulse Rate:  [46-76] 66 (10/01 0657) Cardiac Rhythm:  [-] Normal sinus rhythm (09/30 1901) Resp:  [13-22] 16 (10/01 0657) BP: (145-206)/(65-101) 183/79 mmHg (10/01 0657) SpO2:  [96 %-100 %] 99 % (10/01 0657) Weight:  [139 lb 15.9 oz (63.5 kg)-145 lb 12.8 oz (66.134 kg)] 145 lb 12.8 oz (66.134 kg) (10/01 0456)   Recent Labs Lab 05/22/14 1220 05/22/14 1229 05/22/14 2255 05/23/14 0117  NA 143 141  --  142  K 3.6* 3.3*  --  3.9  CL 103 107  --  108  CO2 24  --   --  21  GLUCOSE 98 99  --  91  BUN 13 12  --  10   CREATININE 0.83 0.90 0.73 0.72  CALCIUM 9.4  --   --  9.0    Recent Labs Lab 05/22/14 1220  AST 18  ALT 12  ALKPHOS 124*  BILITOT 0.2*  PROT 7.9  ALBUMIN 4.0    Recent Labs Lab 05/22/14 1220 05/22/14 1229 05/22/14 2255 05/23/14 0117  WBC 3.9*  --  4.5 4.6  NEUTROABS 2.6  --   --   --   HGB 13.9 16.0* 12.7 12.5  HCT 41.4 47.0* 37.1 36.2  MCV 86.6  --  83.9 83.8  PLT 220  --  230 219    Recent Labs Lab 05/22/14 2255 05/23/14 0117  TROPONINI <0.30 <0.30    Recent Labs  05/22/14 1220  LABPROT 13.0  INR 0.98    Recent Labs  05/22/14 1213  COLORURINE YELLOW  LABSPEC 1.012  PHURINE 6.5  GLUCOSEU NEGATIVE  HGBUR NEGATIVE  BILIRUBINUR NEGATIVE  KETONESUR NEGATIVE  PROTEINUR NEGATIVE  UROBILINOGEN 0.2  NITRITE NEGATIVE  LEUKOCYTESUR SMALL*       Component Value Date/Time   CHOL 253* 05/23/2014 0125   TRIG 56 05/23/2014 0125   TRIG 77 02/12/2014 1224   HDL 54 05/23/2014 0125   CHOLHDL 4.7 05/23/2014 0125   VLDL 11 05/23/2014 0125   LDLCALC 188* 05/23/2014 0125   LDLCALC 189* 02/12/2014 1224   Lab Results  Component Value Date   HGBA1C  5.7* 03/13/2014      Component Value Date/Time   LABOPIA NONE DETECTED 05/22/2014 1213   COCAINSCRNUR NONE DETECTED 05/22/2014 1213   LABBENZ NONE DETECTED 05/22/2014 1213   AMPHETMU NONE DETECTED 05/22/2014 1213   THCU NONE DETECTED 05/22/2014 1213   LABBARB NONE DETECTED 05/22/2014 1213     Recent Labs Lab 05/22/14 1220  ETH <11    Dg Chest 2 View 05/22/2014    No active cardiopulmonary disease.     Ct Head Wo Contrast 05/22/2014   No acute intracranial pathology.     Mr Brain Wo Contrast 05/22/2014     No acute intracranial process.  Severe white matter changes suggest sequelae of chronic small vessel ischemic disease. Dolicoectatic intracranial vessels can be seen with chronic hypertension.    Mr Jodene Nam Head/brain Wo Cm 05/22/2014   Moderately motion degraded examination. Luminal irregularity of the intracranial  vessels suggests atherosclerosis with at least mild to moderate distal basilar artery stenosis.     CUS - pending  2D echo - pending  PHYSICAL EXAM  Temp:  [97.9 F (36.6 C)-99.1 F (37.3 C)] 97.9 F (36.6 C) (10/01 1756) Pulse Rate:  [51-74] 54 (10/01 1756) Resp:  [16-18] 18 (10/01 1756) BP: (141-193)/(65-87) 161/69 mmHg (10/01 1756) SpO2:  [96 %-100 %] 98 % (10/01 1756) Weight:  [144 lb (65.318 kg)-145 lb 12.8 oz (66.134 kg)] 145 lb 12.8 oz (66.134 kg) (10/01 0456)  General - Well nourished, well developed, in no apparent distress.  Ophthalmologic - Sharp disc margins OU.  Cardiovascular - Regular rate and rhythm with no murmur.  Mental Status -  Level of arousal and orientation to time, place, and person were intact. Language including expression, naming, repetition, comprehension, reading, and writing was assessed and found intact.  Cranial Nerves II - XII - II - Visual field intact OU. III, IV, VI - Extraocular movements intact. V - Facial sensation intact bilaterally. VII - Facial movement intact bilaterally. VIII - Hearing & vestibular intact bilaterally. X - Palate elevates symmetrically. XI - Chin turning & shoulder shrug intact bilaterally. XII - Tongue protrusion intact.  Motor Strength - The patient's strength was normal in all extremities and pronator drift was absent.  Bulk was normal and fasciculations were absent.   Motor Tone - Muscle tone was assessed at the neck and appendages and was normal.  Reflexes - The patient's reflexes were normal in all extremities and she had no pathological reflexes.  Sensory - Light touch, temperature/pinprick were assessed and were normal.    Coordination - The patient had normal movements in the hands and feet with no ataxia or dysmetria.  Tremor was absent.  Gait and Station - The patient's transfers, posture, gait, station, and turns were observed as normal.   ASSESSMENT/PLAN  Stacy Page is a 80 y.o.  female with history of hypertension and hyperlipidemia presenting with new onset speech output difficult, right-sided weakness and numbness. She did not receive IV t-PA due to rapidly resolving deficits. MRI imaging confirms no acute stroke.   TIA of left anterior circulation  MRI  No acute stroke  MRA  Intracerebral atherosclerosis  Carotid Doppler  pending   2D Echo  pending   no antithrombotics prior to admission, now on clopidogrel 75 mg orally every day. Continue Plavix at discharge.  HgbA1c 5.7 WNL  Heparin 5000 units sq tid for VTE prophylaxis  Cardiac thin liquids.   Activity as tolerated, OOB with assistance  Resultant neuro deficits resolved  Therapy  recommendations:  home  Ongoing aggressive risk factor management  Risk factor education  Patient counseled to be compliant with her antithrombotic medications  Hypertension   Permissive hypertension <220/120 for 24-48 hours and then gradually normalized within 5-7 days.  BP goal long term normotensive BP 145-206/77-101 past 24h (05/23/2014 @ 11:06 AM)  Stable  Patient counseled to be compliant with her blood pressure medications  Hyperlipidemia  Home meds:  Fish oil,  resumed in hospital.  Add lipitor 40 mg daily  LDL 188, goal < 70   Continue statin at discharge  Other Stroke Risk Factors Advanced age Former Cigarette smoker, stopped 40 years ago   Obesity, Body mass index is 30.48 kg/(m^2).    Family hx stroke (mother)  Hospital day # 1  SHARON BIBY, MSN, RN, ANVP-BC, ANP-BC, [facility] Pager: [phone] 05/23/2014 11:08 AM   I, the attending vascular neurologist, have personally obtained a history, examined the patient, evaluated laboratory data, individually viewed imaging studies, and formulated the assessment and plan of care.  I have made any additions or clarifications directly to the above note and agree with the findings and plan as currently documented.    Rosalin Hawking, MD PhD Stroke Neurology 05/23/2014 6:11 PM  To contact Stroke Continuity provider, please refer to [email or web address] After hours, contact General Neurology

## 2014-05-23 NOTE — Progress Notes (Signed)
Occupational Therapy Evaluation Patient Details Name: Stacy Page MRN: [id number] DOB: [birth year] Today's Date: 05/23/2014    History of Present Illness 80 y.o. female admitted with 1-2 day h/o of R leg numbness and weakness along with some forgetfulness and confusion. MRI was negative for acute event. All symptoms have now resolved.   Clinical Impression   Patient evaluated by Occupational Therapy with no further acute OT needs identified. Pt demonstrated tub transfer and indicated that she wanted a shower seat.  All education has been completed and the patient has no further questions. OT spoke with case manager to obtain a shower seat for pt prior to d/c. See below for any follow-up Occupational Therapy or equipment needs. OT to sign off. Thank you for referral.     Follow Up Recommendations  No OT follow up    Equipment Recommendations  Tub/shower seat    Recommendations for Other Services       Precautions / Restrictions Precautions Precautions: None Restrictions Weight Bearing Restrictions: No      Mobility Bed Mobility Overal bed mobility: Independent                Transfers Overall transfer level: Independent Equipment used: None                  Balance Overall balance assessment: No apparent balance deficits (not formally assessed)                                    ADL Overall ADL's : Independent Pt completed oral care sink level tub transfer and request tub seat. CM called and notified                                             Vision                     Perception     Praxis      Pertinent Vitals/Pain Pain Assessment: No/denies pain     Hand Dominance     Extremity/Trunk Assessment Upper Extremity Assessment Upper Extremity Assessment: Overall WFL for tasks assessed   Lower Extremity Assessment Lower Extremity Assessment: Overall WFL for tasks assessed   Cervical / Trunk  Assessment Cervical / Trunk Assessment: Normal   Communication Communication Communication: No difficulties   Cognition Arousal/Alertness: Awake/alert Behavior During Therapy: WFL for tasks assessed/performed Overall Cognitive Status: Within Functional Limits for tasks assessed                     General Comments       Exercises       Shoulder Instructions      Home Living Family/patient expects to be discharged to:: Private residence Living Arrangements: Children;Other relatives Available Help at Discharge: Family;Available PRN/intermittently Type of Home: House Home Access: Stairs to enter [employer] of Steps: 2   Home Layout: One level     Bathroom Shower/Tub: Tub/shower unit Shower/tub characteristics: Curtain       Home Equipment: None   Additional Comments: Pt stated "I am going to get one of those shower seats."       Prior Functioning/Environment Level of Independence: Independent             OT Diagnosis:     OT Problem  List:     OT Treatment/Interventions:      OT Goals(Current goals can be found in the care plan section)    OT Frequency:     Barriers to D/C:            Co-evaluation              End of Session Equipment Utilized During Treatment: Gait belt Nurse Communication: Mobility status;Other (comment) (D/C recommendations)  Activity Tolerance: Patient tolerated treatment well Patient left: in bed;with call bell/phone within reach;with bed alarm set   Time: 1450-1520 OT Time Calculation (min): 30 min Charges:  OT General Charges $OT Visit: 1 Procedure OT Evaluation $Initial OT Evaluation Tier I: 1 Procedure OT Treatments $Self Care/Home Management : 23-37 mins G-Codes: OT G-codes **NOT FOR INPATIENT CLASS** Functional Assessment Tool Used: clinical judgement Functional Limitation: Self care Self Care Current Status (Q4920): 0 percent impaired, limited or restricted Self Care Goal Status  (F0071): 0 percent impaired, limited or restricted Self Care Discharge Status (Q1975): 0 percent impaired, limited or restricted  Parke Poisson B 05/23/2014, 4:00 PM I agree with the following treatment note after reviewing documentation.   Jeri Modena OTR/L Pager: [phone] Office: [phone] .

## 2014-05-23 NOTE — Evaluation (Signed)
Physical Therapy Evaluation Patient Details Name: Stacy Page MRN: [id number] DOB: Nov 30, 1932 Today's Date: 05/23/2014   History of Present Illness  Admitted with 1-2 day h/o numbness and tingling in R leg which resolve soon after admission.  Pt also had expressive difficulties for about a 20 minute period after admission.  MRI was negative for acute event.  All symptoms have now resolved.  Clinical Impression  Thankfully all s/s of stroke have fully resolved and pt performed well and age appropriately with the balance testing.  Not further PT needs.  Will sign off.    Follow Up Recommendations No PT follow up    Equipment Recommendations  None recommended by PT    Recommendations for Other Services       Precautions / Restrictions Precautions Precautions: None Restrictions Weight Bearing Restrictions: No      Mobility  Bed Mobility Overal bed mobility: Independent                Transfers Overall transfer level: Independent                  Ambulation/Gait Ambulation/Gait assistance: Independent Ambulation Distance (Feet): 300 Feet Assistive device: None Gait Pattern/deviations: WFL(Within Functional Limits) Gait velocity: community level speed Gait velocity interpretation: at or above normal speed for age/gender General Gait Details: WFL  Stairs Stairs: Yes Stairs assistance: Modified independent (Device/Increase time) Stair Management: One rail Right;Alternating pattern;Forwards Number of Stairs: 2 General stair comments: steady with rail  Wheelchair Mobility    Modified Rankin (Stroke Patients Only)       Balance Overall balance assessment: No apparent balance deficits (not formally assessed)                               Standardized Balance Assessment Standardized Balance Assessment : Berg Balance Test;Dynamic Gait Index Berg Balance Test Sit to Stand: Able to stand without using hands and stabilize  independently Standing Unsupported: Able to stand safely 2 minutes Sitting with Back Unsupported but Feet Supported on Floor or Stool: Able to sit safely and securely 2 minutes Stand to Sit: Sits safely with minimal use of hands Transfers: Able to transfer safely, minor use of hands Standing Unsupported with Eyes Closed: Able to stand 10 seconds safely Standing Ubsupported with Feet Together: Able to place feet together independently and stand 1 minute safely From Standing, Reach Forward with Outstretched Arm: Can reach confidently >25 cm (10") From Standing Position, Pick up Object from Floor: Able to pick up shoe safely and easily From Standing Position, Turn to Look Behind Over each Shoulder: Looks behind from both sides and weight shifts well Turn 360 Degrees: Able to turn 360 degrees safely in 4 seconds or less Standing Unsupported, Alternately Place Feet on Step/Stool: Able to stand independently and complete 8 steps >20 seconds Standing Unsupported, One Foot in Front: Able to plae foot ahead of the other independently and hold 30 seconds Standing on One Leg: Able to lift leg independently and hold equal to or more than 3 seconds Total Score: 52 Dynamic Gait Index Level Surface: Normal Change in Gait Speed: Normal Gait with Horizontal Head Turns: Normal Gait with Vertical Head Turns: Normal Gait and Pivot Turn: Normal Step Over Obstacle: Mild Impairment Steps: Mild Impairment       Pertinent Vitals/Pain Pain Assessment: No/denies pain    Home Living Family/patient expects to be discharged to:: Private residence Living Arrangements: Children;Other relatives Available Help at Discharge:  Family;Available PRN/intermittently Type of Home: House Home Access: Stairs to enter   Entrance Stairs-Number of Steps: 2 Home Layout: One level Home Equipment: None      Prior Function Level of Independence: Independent               Hand Dominance        Extremity/Trunk  Assessment   Upper Extremity Assessment: Overall WFL for tasks assessed           Lower Extremity Assessment: Overall WFL for tasks assessed      Cervical / Trunk Assessment: Normal  Communication   Communication: No difficulties  Cognition Arousal/Alertness: Awake/alert Behavior During Therapy: WFL for tasks assessed/performed Overall Cognitive Status: Within Functional Limits for tasks assessed                      General Comments      Exercises        Assessment/Plan    PT Assessment Patent does not need any further PT services  PT Diagnosis     PT Problem List    PT Treatment Interventions     PT Goals (Current goals can be found in the Care Plan section) Acute Rehab PT Goals PT Goal Formulation: No goals set, d/c therapy    Frequency     Barriers to discharge        Co-evaluation               End of Session   Activity Tolerance: Patient tolerated treatment well Patient left: in bed      Functional Assessment Tool Used: clinical judgement Functional Limitation: Mobility: Walking and moving around Mobility: Walking and Moving Around Current Status (E9407): 0 percent impaired, limited or restricted Mobility: Walking and Moving Around Goal Status (W8088): 0 percent impaired, limited or restricted Mobility: Walking and Moving Around Discharge Status [phone]): 0 percent impaired, limited or restricted    Time: 1345-1404 PT Time Calculation (min): 19 min   Charges:   PT Evaluation $Initial PT Evaluation Tier I: 1 Procedure PT Treatments $Gait Training: 8-22 mins   PT G Codes:   Functional Assessment Tool Used: clinical judgement Functional Limitation: Mobility: Walking and moving around    Jackie Littlejohn, Tessie Fass 05/23/2014, 2:10 PM 05/23/2014  Donnella Sham, PT [phone]  (pager)

## 2014-05-24 DIAGNOSIS — G459 Transient cerebral ischemic attack, unspecified: Secondary | ICD-10-CM | POA: Diagnosis not present

## 2014-05-24 DIAGNOSIS — I4891 Unspecified atrial fibrillation: Secondary | ICD-10-CM

## 2014-05-24 MED ORDER — HYDRALAZINE HCL 100 MG PO TABS: 100.0000 mg | ORAL_TABLET | Freq: Three times a day (TID) | ORAL | Status: DC

## 2014-05-24 MED ORDER — CLOPIDOGREL BISULFATE 75 MG PO TABS: 75.0000 mg | ORAL_TABLET | Freq: Every day | ORAL | Status: DC

## 2014-05-24 MED ORDER — GUAIFENESIN-DM 100-10 MG/5ML PO SYRP: 5.0000 mL | ORAL_SOLUTION | ORAL | Status: DC | PRN

## 2014-05-24 MED ORDER — LISINOPRIL 10 MG PO TABS: 10.0000 mg | ORAL_TABLET | Freq: Every day | ORAL | Status: DC

## 2014-05-24 MED ORDER — ATORVASTATIN CALCIUM 40 MG PO TABS: 40.0000 mg | ORAL_TABLET | Freq: Every day | ORAL | Status: DC

## 2014-05-24 NOTE — Discharge Summary (Addendum)
Physician Discharge Summary  Stacy Page MRN: [id number] DOB/AGE: [birth year] 78 y.o.  PCP: Unice Cobble, MD   Admit date: 05/22/2014 Discharge date: 05/24/2014  Discharge Diagnoses:      Acute anterior circulation TIA, no acute CVA Resting bradycardia   HYPERLIPIDEMIA   ULNAR NEUROPATHY, LEFT   HYPERTENSION, ESSENTIAL NOS   HYPOTENSION-ORTHOSTATIC   ASTHMA   GERD   SPINAL STENOSIS     Followup recommendations Follow up with PCP in 5-7 days pcp pls review 2 d echo    Medication List    STOP taking these medications       nebivolol 5 MG tablet  Commonly known as:  BYSTOLIC      TAKE these medications       albuterol 108 (90 BASE) MCG/ACT inhaler  Commonly known as:  PROVENTIL HFA;VENTOLIN HFA  Inhale 2 puffs into the lungs every 6 (six) hours as needed for wheezing or shortness of breath (asthma).     atorvastatin 40 MG tablet  Commonly known as:  LIPITOR  Take 1 tablet (40 mg total) by mouth daily at 6 PM.     b complex vitamins tablet  Take 1 tablet by mouth daily.     CALTRATE 600 PO  Take 1,200 mg by mouth daily.     clopidogrel 75 MG tablet  Commonly known as:  PLAVIX  Take 1 tablet (75 mg total) by mouth daily.     co-enzyme Q-10 30 MG capsule  Take 30 mg by mouth daily.     cycloSPORINE 0.05 % ophthalmic emulsion  Commonly known as:  RESTASIS  Place 1 drop into both eyes 2 (two) times daily.     fexofenadine 180 MG tablet  Commonly known as:  ALLEGRA  Take 180 mg by mouth daily as needed for allergies (allergies).     fish oil-omega-3 fatty acids 1000 MG capsule  Take 1 g by mouth daily.     fluticasone 50 MCG/ACT nasal spray  Commonly known as:  FLONASE  Place 2 sprays into both nostrils daily.     furosemide 40 MG tablet  Commonly known as:  LASIX  Take 40 mg by mouth daily.     gabapentin 100 MG capsule  Commonly known as:  NEURONTIN  Take 100 mg by mouth 3 (three) times daily. For leg pain     Garlic 882 MG Tabs   Take 300 mg by mouth daily.     guaiFENesin-dextromethorphan 100-10 MG/5ML syrup  Commonly known as:  ROBITUSSIN DM  Take 5 mLs by mouth every 4 (four) hours as needed for cough.     Horse Chestnut 300 MG Tabs  Take 300 mg by mouth 2 (two) times daily.     hydrALAZINE 100 MG tablet  Commonly known as:  APRESOLINE  Take 1 tablet (100 mg total) by mouth 3 (three) times daily.     lisinopril 10 MG tablet  Commonly known as:  PRINIVIL  Take 1 tablet (10 mg total) by mouth daily.     LORazepam 0.5 MG tablet  Commonly known as:  ATIVAN  Take 0.5 mg by mouth every 8 (eight) hours as needed for anxiety (anxiety).     meclizine 25 MG tablet  Commonly known as:  ANTIVERT  Take 12.5 mg by mouth 3 (three) times daily as needed for dizziness (dizziness).     olmesartan 40 MG tablet  Commonly known as:  BENICAR  Take 40 mg by mouth daily.  pantoprazole 20 MG tablet  Commonly known as:  PROTONIX  Take 1 tablet (20 mg total) by mouth daily.     potassium chloride 10 MEQ tablet  Commonly known as:  K-DUR,KLOR-CON  Take 10 mEq by mouth 2 (two) times daily.     vitamin C 500 MG tablet  Commonly known as:  ASCORBIC ACID  Take 500 mg by mouth daily.     Vitamin D3 2000 UNITS Tabs  Take 2,000 Units by mouth daily.            Discharge Condition: Stable   Disposition: 01-Home or Self Care   Consults: Neurology   Significant Diagnostic Studies: Dg Chest 2 View  05/22/2014   CLINICAL DATA:  She experienced right leg numbness with some balance trouble; plus, for a short time was somewhat confused (couldn't remember her doctor's name). She is currently awake, alert and in no distress.Weakness. Pt has hx GERD, HTN, Asthma. Past-Smoker  EXAM: CHEST  2 VIEW  COMPARISON:  08/25/2012  FINDINGS: The heart size and mediastinal contours are within normal limits. Both lungs are clear. The visualized skeletal structures are unremarkable.  IMPRESSION: No active cardiopulmonary disease.    Electronically Signed   By: Kathreen Devoid   On: 05/22/2014 12:54   Ct Head Wo Contrast  05/22/2014   CLINICAL DATA:  Right leg numbness.  Extremity weakness.  EXAM: CT HEAD WITHOUT CONTRAST  TECHNIQUE: Contiguous axial images were obtained from the base of the skull through the vertex without intravenous contrast.  COMPARISON:  03/12/2014  FINDINGS: There is no evidence of mass effect, midline shift, or extra-axial fluid collections. There is no evidence of a space-occupying lesion or intracranial hemorrhage. There is no evidence of a cortical-based area of acute infarction. There is generalized cerebral atrophy. There is periventricular white matter low attenuation likely secondary to microangiopathy.  The ventricles and sulci are appropriate for the patient's age. The basal cisterns are patent.  Visualized portions of the orbits are unremarkable. The visualized portions of the paranasal sinuses and mastoid air cells are unremarkable.  The osseous structures are unremarkable.  IMPRESSION: No acute intracranial pathology.   Electronically Signed   By: Kathreen Devoid   On: 05/22/2014 12:36   Mr Brain Wo Contrast  05/22/2014   CLINICAL DATA:  RIGHT leg tingling, numbness, weakness. Forgetfulness and confusion beginning 2 days ago. Hypertension. History of asymptomatic spinal stenosis.  EXAM: MRI HEAD WITHOUT CONTRAST  MRA HEAD WITHOUT CONTRAST  TECHNIQUE: Multiplanar, multiecho pulse sequences of the brain and surrounding structures were obtained without intravenous contrast. Angiographic images of the head were obtained using MRA technique without contrast.  COMPARISON:  CT of the head May 22, 2014  FINDINGS: MRI HEAD FINDINGS  No reduced diffusion to suggest acute ischemia. No susceptibility artifact to suggest hemorrhage.  Mild ventriculomegaly, likely on the basis of global parenchymal brain volume loss as there is overall commensurate enlargement of cerebral sulci and cerebellar folia. Symmetric  confluent supratentorial white matter T2 hyperintensities without mass effect. No midline shift or mass effect.  No abnormal extra-axial fluid collections. Major intracranial vascular flow voids better characterized on MRA. The dolichoectatic appearance the intracranial vessels.  Paranasal sinuses and mastoid air cells appear well-aerated. Patient is edentulous. Ocular globes and orbital contents are unremarkable. Fluid replaced not expanded sella. No cerebellar tonsillar ectopia. Moderate cervical spondylosis partially imaged. No suspicious bone marrow signal.  MRA HEAD FINDINGS  Moderately motion degraded examination.  Anterior circulation: Normal flow related enhancement of  cervical, petrous, cavernous and supra clinoid internal carotid arteries. Patent communicating artery with normal flow related enhancement air anterior and middle cerebral arteries.  Posterior circulation: LEFT vertebral artery is dominant. Normal flow related enhancement of the basilar artery with some tortuosity, and at least mild to moderate distal basilar artery narrowing. Diminutive LEFT P1 segment with robust LEFT posterior communicating artery providing the dominant vascular supply. Patent posterior cerebral arteries.  No large vessel occlusion. No definite aneurysm the motion degrades sensitivity. Moderate luminal irregularity of the intracranial vessels likely atherosclerosis.  IMPRESSION: MRI head:  No acute intracranial process.  Severe white matter changes suggest sequelae of chronic small vessel ischemic disease. Dolicoectatic intracranial vessels can be seen with chronic hypertension.  MRA head: Moderately motion degraded examination. Luminal irregularity of the intracranial vessels suggests atherosclerosis with at least mild to moderate distal basilar artery stenosis.   Electronically Signed   By: Elon Alas   On: 05/22/2014 23:58   Mr Jodene Nam Head/brain Wo Cm  05/22/2014   CLINICAL DATA:  RIGHT leg tingling, numbness,  weakness. Forgetfulness and confusion beginning 2 days ago. Hypertension. History of asymptomatic spinal stenosis.  EXAM: MRI HEAD WITHOUT CONTRAST  MRA HEAD WITHOUT CONTRAST  TECHNIQUE: Multiplanar, multiecho pulse sequences of the brain and surrounding structures were obtained without intravenous contrast. Angiographic images of the head were obtained using MRA technique without contrast.  COMPARISON:  CT of the head May 22, 2014  FINDINGS: MRI HEAD FINDINGS  No reduced diffusion to suggest acute ischemia. No susceptibility artifact to suggest hemorrhage.  Mild ventriculomegaly, likely on the basis of global parenchymal brain volume loss as there is overall commensurate enlargement of cerebral sulci and cerebellar folia. Symmetric confluent supratentorial white matter T2 hyperintensities without mass effect. No midline shift or mass effect.  No abnormal extra-axial fluid collections. Major intracranial vascular flow voids better characterized on MRA. The dolichoectatic appearance the intracranial vessels.  Paranasal sinuses and mastoid air cells appear well-aerated. Patient is edentulous. Ocular globes and orbital contents are unremarkable. Fluid replaced not expanded sella. No cerebellar tonsillar ectopia. Moderate cervical spondylosis partially imaged. No suspicious bone marrow signal.  MRA HEAD FINDINGS  Moderately motion degraded examination.  Anterior circulation: Normal flow related enhancement of cervical, petrous, cavernous and supra clinoid internal carotid arteries. Patent communicating artery with normal flow related enhancement air anterior and middle cerebral arteries.  Posterior circulation: LEFT vertebral artery is dominant. Normal flow related enhancement of the basilar artery with some tortuosity, and at least mild to moderate distal basilar artery narrowing. Diminutive LEFT P1 segment with robust LEFT posterior communicating artery providing the dominant vascular supply. Patent posterior  cerebral arteries.  No large vessel occlusion. No definite aneurysm the motion degrades sensitivity. Moderate luminal irregularity of the intracranial vessels likely atherosclerosis.  IMPRESSION: MRI head:  No acute intracranial process.  Severe white matter changes suggest sequelae of chronic small vessel ischemic disease. Dolicoectatic intracranial vessels can be seen with chronic hypertension.  MRA head: Moderately motion degraded examination. Luminal irregularity of the intracranial vessels suggests atherosclerosis with at least mild to moderate distal basilar artery stenosis.   Electronically Signed   By: Elon Alas   On: 05/22/2014 23:58       Microbiology: No results found for this or any previous visit (from the past 240 hour(s)).   Labs: Results for orders placed during the hospital encounter of 05/22/14 (from the past 48 hour(s))  URINE RAPID DRUG SCREEN (HOSP PERFORMED)     Status: None  Collection Time    05/22/14 12:13 PM      Result Value Ref Range   Opiates NONE DETECTED  NONE DETECTED   Cocaine NONE DETECTED  NONE DETECTED   Benzodiazepines NONE DETECTED  NONE DETECTED   Amphetamines NONE DETECTED  NONE DETECTED   Tetrahydrocannabinol NONE DETECTED  NONE DETECTED   Barbiturates NONE DETECTED  NONE DETECTED   Comment:            DRUG SCREEN FOR MEDICAL PURPOSES     ONLY.  IF CONFIRMATION IS NEEDED     FOR ANY PURPOSE, NOTIFY LAB     WITHIN 5 DAYS.                LOWEST DETECTABLE LIMITS     FOR URINE DRUG SCREEN     Drug Class       Cutoff (ng/mL)     Amphetamine      1000     Barbiturate      200     Benzodiazepine   932     Tricyclics       355     Opiates          300     Cocaine          300     THC              50  URINALYSIS, ROUTINE W REFLEX MICROSCOPIC     Status: Abnormal   Collection Time    05/22/14 12:13 PM      Result Value Ref Range   Color, Urine YELLOW  YELLOW   APPearance CLEAR  CLEAR   Specific Gravity, Urine 1.012  1.005 - 1.030    pH 6.5  5.0 - 8.0   Glucose, UA NEGATIVE  NEGATIVE mg/dL   Hgb urine dipstick NEGATIVE  NEGATIVE   Bilirubin Urine NEGATIVE  NEGATIVE   Ketones, ur NEGATIVE  NEGATIVE mg/dL   Protein, ur NEGATIVE  NEGATIVE mg/dL   Urobilinogen, UA 0.2  0.0 - 1.0 mg/dL   Nitrite NEGATIVE  NEGATIVE   Leukocytes, UA SMALL (*) NEGATIVE  URINE MICROSCOPIC-ADD ON     Status: None   Collection Time    05/22/14 12:13 PM      Result Value Ref Range   Squamous Epithelial / LPF RARE  RARE   WBC, UA 0-2  <3 WBC/hpf   Bacteria, UA RARE  RARE  ETHANOL     Status: None   Collection Time    05/22/14 12:20 PM      Result Value Ref Range   Alcohol, Ethyl (B) <11  0 - 11 mg/dL   Comment:            LOWEST DETECTABLE LIMIT FOR     SERUM ALCOHOL IS 11 mg/dL     FOR MEDICAL PURPOSES ONLY  PROTIME-INR     Status: None   Collection Time    05/22/14 12:20 PM      Result Value Ref Range   Prothrombin Time 13.0  11.6 - 15.2 seconds   INR 0.98  0.00 - 1.49  APTT     Status: None   Collection Time    05/22/14 12:20 PM      Result Value Ref Range   aPTT 32  24 - 37 seconds  CBC     Status: Abnormal   Collection Time    05/22/14 12:20 PM      Result Value Ref Range  WBC 3.9 (*) 4.0 - 10.5 K/uL   RBC 4.78  3.87 - 5.11 MIL/uL   Hemoglobin 13.9  12.0 - 15.0 g/dL   HCT 41.4  36.0 - 46.0 %   MCV 86.6  78.0 - 100.0 fL   MCH 29.1  26.0 - 34.0 pg   MCHC 33.6  30.0 - 36.0 g/dL   RDW 14.4  11.5 - 15.5 %   Platelets 220  150 - 400 K/uL  DIFFERENTIAL     Status: None   Collection Time    05/22/14 12:20 PM      Result Value Ref Range   Neutrophils Relative % 66  43 - 77 %   Neutro Abs 2.6  1.7 - 7.7 K/uL   Lymphocytes Relative 26  12 - 46 %   Lymphs Abs 1.0  0.7 - 4.0 K/uL   Monocytes Relative 6  3 - 12 %   Monocytes Absolute 0.2  0.1 - 1.0 K/uL   Eosinophils Relative 1  0 - 5 %   Eosinophils Absolute 0.0  0.0 - 0.7 K/uL   Basophils Relative 1  0 - 1 %   Basophils Absolute 0.0  0.0 - 0.1 K/uL  COMPREHENSIVE  METABOLIC PANEL     Status: Abnormal   Collection Time    05/22/14 12:20 PM      Result Value Ref Range   Sodium 143  137 - 147 mEq/L   Potassium 3.6 (*) 3.7 - 5.3 mEq/L   Chloride 103  96 - 112 mEq/L   CO2 24  19 - 32 mEq/L   Glucose, Bld 98  70 - 99 mg/dL   BUN 13  6 - 23 mg/dL   Creatinine, Ser 0.83  0.50 - 1.10 mg/dL   Calcium 9.4  8.4 - 10.5 mg/dL   Total Protein 7.9  6.0 - 8.3 g/dL   Albumin 4.0  3.5 - 5.2 g/dL   AST 18  0 - 37 U/L   ALT 12  0 - 35 U/L   Alkaline Phosphatase 124 (*) 39 - 117 U/L   Total Bilirubin 0.2 (*) 0.3 - 1.2 mg/dL   GFR calc non Af Amer 65 (*) >90 mL/min   GFR calc Af Amer 76 (*) >90 mL/min   Comment: (NOTE)     The eGFR has been calculated using the CKD EPI equation.     This calculation has not been validated in all clinical situations.     eGFR's persistently <90 mL/min signify possible Chronic Kidney     Disease.   Anion gap 16 (*) 5 - 15  I-STAT TROPOININ, ED     Status: None   Collection Time    05/22/14 12:28 PM      Result Value Ref Range   Troponin i, poc 0.01  0.00 - 0.08 ng/mL   Comment 3            Comment: Due to the release kinetics of cTnI,     a negative result within the first hours     of the onset of symptoms does not rule out     myocardial infarction with certainty.     If myocardial infarction is still suspected,     repeat the test at appropriate intervals.  I-STAT CHEM 8, ED     Status: Abnormal   Collection Time    05/22/14 12:29 PM      Result Value Ref Range   Sodium 141  137 - 147  mEq/L   Potassium 3.3 (*) 3.7 - 5.3 mEq/L   Chloride 107  96 - 112 mEq/L   BUN 12  6 - 23 mg/dL   Creatinine, Ser 0.90  0.50 - 1.10 mg/dL   Glucose, Bld 99  70 - 99 mg/dL   Calcium, Ion 1.13  1.13 - 1.30 mmol/L   TCO2 24  0 - 100 mmol/L   Hemoglobin 16.0 (*) 12.0 - 15.0 g/dL   HCT 47.0 (*) 36.0 - 46.0 %  CBC     Status: None   Collection Time    05/22/14 10:55 PM      Result Value Ref Range   WBC 4.5  4.0 - 10.5 K/uL   RBC 4.42   3.87 - 5.11 MIL/uL   Hemoglobin 12.7  12.0 - 15.0 g/dL   HCT 37.1  36.0 - 46.0 %   MCV 83.9  78.0 - 100.0 fL   MCH 28.7  26.0 - 34.0 pg   MCHC 34.2  30.0 - 36.0 g/dL   RDW 14.1  11.5 - 15.5 %   Platelets 230  150 - 400 K/uL  CREATININE, SERUM     Status: Abnormal   Collection Time    05/22/14 10:55 PM      Result Value Ref Range   Creatinine, Ser 0.73  0.50 - 1.10 mg/dL   GFR calc non Af Amer 79 (*) >90 mL/min   GFR calc Af Amer >90  >90 mL/min   Comment: (NOTE)     The eGFR has been calculated using the CKD EPI equation.     This calculation has not been validated in all clinical situations.     eGFR's persistently <90 mL/min signify possible Chronic Kidney     Disease.  TROPONIN I     Status: None   Collection Time    05/22/14 10:55 PM      Result Value Ref Range   Troponin I <0.30  <0.30 ng/mL   Comment:            Due to the release kinetics of cTnI,     a negative result within the first hours     of the onset of symptoms does not rule out     myocardial infarction with certainty.     If myocardial infarction is still suspected,     repeat the test at appropriate intervals.  HEMOGLOBIN A1C     Status: Abnormal   Collection Time    05/23/14  1:17 AM      Result Value Ref Range   Hemoglobin A1C 5.7 (*) <5.7 %   Comment: (NOTE)                                                                               According to the ADA Clinical Practice Recommendations for 2011, when     HbA1c is used as a screening test:      >=6.5%   Diagnostic of Diabetes Mellitus               (if abnormal result is confirmed)     5.7-6.4%   Increased risk of developing Diabetes Mellitus     References:Diagnosis  and Classification of Diabetes Mellitus,Diabetes     QQIW,9798,92(JJHER 1):S62-S69 and Standards of Medical Care in             Diabetes - 2011,Diabetes DEYC,1448,18 (Suppl 1):S11-S61.   Mean Plasma Glucose 117 (*) <117 mg/dL   Comment: Performed at [facility]     Status: Abnormal   Collection Time    05/23/14  1:17 AM      Result Value Ref Range   Sodium 142  137 - 147 mEq/L   Potassium 3.9  3.7 - 5.3 mEq/L   Chloride 108  96 - 112 mEq/L   CO2 21  19 - 32 mEq/L   Glucose, Bld 91  70 - 99 mg/dL   BUN 10  6 - 23 mg/dL   Creatinine, Ser 0.72  0.50 - 1.10 mg/dL   Calcium 9.0  8.4 - 10.5 mg/dL   GFR calc non Af Amer 80 (*) >90 mL/min   GFR calc Af Amer >90  >90 mL/min   Comment: (NOTE)     The eGFR has been calculated using the CKD EPI equation.     This calculation has not been validated in all clinical situations.     eGFR's persistently <90 mL/min signify possible Chronic Kidney     Disease.   Anion gap 13  5 - 15  CBC     Status: None   Collection Time    05/23/14  1:17 AM      Result Value Ref Range   WBC 4.6  4.0 - 10.5 K/uL   RBC 4.32  3.87 - 5.11 MIL/uL   Hemoglobin 12.5  12.0 - 15.0 g/dL   HCT 36.2  36.0 - 46.0 %   MCV 83.8  78.0 - 100.0 fL   MCH 28.9  26.0 - 34.0 pg   MCHC 34.5  30.0 - 36.0 g/dL   RDW 14.1  11.5 - 15.5 %   Platelets 219  150 - 400 K/uL  TROPONIN I     Status: None   Collection Time    05/23/14  1:17 AM      Result Value Ref Range   Troponin I <0.30  <0.30 ng/mL   Comment:            Due to the release kinetics of cTnI,     a negative result within the first hours     of the onset of symptoms does not rule out     myocardial infarction with certainty.     If myocardial infarction is still suspected,     repeat the test at appropriate intervals.  LIPID PANEL     Status: Abnormal   Collection Time    05/23/14  1:25 AM      Result Value Ref Range   Cholesterol 253 (*) 0 - 200 mg/dL   Triglycerides 56  <150 mg/dL   HDL 54  >39 mg/dL   Total CHOL/HDL Ratio 4.7     VLDL 11  0 - 40 mg/dL   LDL Cholesterol 188 (*) 0 - 99 mg/dL   Comment:            Total Cholesterol/HDL:CHD Risk     Coronary Heart Disease Risk Table                         Men   Women      1/2 Average Risk   3.4  3.3       Average Risk       5.0   4.4      2 X Average Risk   9.6   7.1      3 X Average Risk  23.4   11.0                Use the calculated Patient Ratio     above and the CHD Risk Table     to determine the patient's CHD Risk.                ATP III CLASSIFICATION (LDL):      <100     mg/dL   Optimal      100-129  mg/dL   Near or Above                        Optimal      130-159  mg/dL   Borderline      160-189  mg/dL   High      >190     mg/dL   Very High  TROPONIN I     Status: None   Collection Time    05/23/14 10:08 AM      Result Value Ref Range   Troponin I <0.30  <0.30 ng/mL   Comment:            Due to the release kinetics of cTnI,     a negative result within the first hours     of the onset of symptoms does not rule out     myocardial infarction with certainty.     If myocardial infarction is still suspected,     repeat the test at appropriate intervals.     HPI  80 y.o. female history of hypertension and hyperlipidemia who developed speech output difficulty along with weakness and numbness involving right upper and lower extremities earlier this morning. She was last well at 6:30 AM 05/22/2014. Patient in which he wanted to say, but couldn't get the words out. She had no problems understanding what was being said to her. She complained of weakness and numbness involving right upper and lower extremities. Deficits improved while she was in the emergency room and had resolved by the time she was transferred to [facility]. There's no previous history of stroke or TIA. CT scan of the head showed no acute intracranial abnormality. She has not been on antiplatelet therapy. NIH stroke score at the time of this evaluation was 0.  Patient was not administered TPA secondary to rapidly resolving deficits. She was admitted for further evaluation and treatment.   HOSPITAL COURSE: [name] is a 80 y.o. female with history of hypertension and hyperlipidemia presenting with new onset  speech output difficult, right-sided weakness and numbness. She did not receive IV t-PA due to rapidly resolving deficits. MRI imaging confirms no acute stroke.  TIA of left anterior circulation  MRI No acute stroke  MRA Intracerebral atherosclerosis  Carotid Doppler 1-39% 2D Echo pending  no antithrombotics prior to admission, now on clopidogrel 75 mg orally every day. Continue Plavix at discharge.  HgbA1c 5.7 WNL  Resultant neuro deficits resolved   Hypertension  Permissive hypertension <220/120 for 24-48 hours and then gradually normalized within 5-7 days.              Continue hydralazine, hold  bystolic because of bradycardia, patient may need to switch to lisinopril  Hyperlipidemia  Home meds: Fish oil, resumed in hospital.  Add lipitor 40 mg daily  LDL 188, goal < 70  Continue statin at discharge  Other Stroke Risk Factors  Advanced age  Former Cigarette smoker, stopped 40 years ago Obesity, Body mass index is 30.48 kg/(m^2).  Family hx stroke (mother)      Discharge Exam: Blood pressure 173/70, pulse 44, temperature 97.9 F (36.6 C), temperature source Oral, resp. rate 18, height '4\' 10"'  (1.473 m), weight 66.225 kg (146 lb), SpO2 100.00%.   General: alert & oriented x 3 In NAD  Cardiovascular: RRR, nl S1 s2  Respiratory: Decreased breath sounds at the bases, scattered rhonchi, no crackles  Abdomen: soft +BS NT/ND, no masses palpable  Extremities: No cyanosis and no edema         Discharge Instructions   Diet - low sodium heart healthy    Complete by:  As directed      Increase activity slowly    Complete by:  As directed            Follow-up Information   Follow up with Unice Cobble, MD. Schedule an appointment as soon as possible for a visit in 1 week.   Specialty:  Internal Medicine   Contact information:   [address] [address] [phone]       Signed: Reyne Dumas 05/24/2014, 11:41 AM

## 2014-05-24 NOTE — Progress Notes (Signed)
*  PRELIMINARY RESULTS* Vascular Ultrasound Carotid Duplex (Doppler) has been completed.   Findings suggest 1-39% internal carotid artery stenosis bilaterally. Vertebral arteries are patent with antegrade flow.  05/24/2014 11:42 AM Maudry Mayhew, RVT, RDCS, RDMS

## 2014-05-24 NOTE — Progress Notes (Signed)
STROKE TEAM PROGRESS NOTE   HISTORY Stacy Page is an 80 y.o. female history of hypertension and hyperlipidemia who developed speech output difficulty along with weakness and numbness involving right upper and lower extremities earlier this morning. She was last well at 6:30 AM 05/22/2014. Patient in which he wanted to say, but couldn't get the words out. She had no problems understanding what was being said to her. She complained of weakness and numbness involving right upper and lower extremities. Deficits improved while she was in the emergency room and had resolved by the time she was transferred to [facility]. There's no previous history of stroke or TIA. CT scan of the head showed no acute intracranial abnormality. She has not been on antiplatelet therapy. NIH stroke score at the time of this evaluation was 0.  Patient was not administered TPA secondary to rapidly resolving deficits. She was admitted for further evaluation and treatment.   SUBJECTIVE (INTERVAL HISTORY) Her daughter is at the bedside.  Overall she feels her condition is completely resolved. She is retired. She stated that she woke up with LLE weakness and difficult get words out. Daughter said she had garbled speech. Resolved in 4-5 hours. Never had similar problems in the past. Quit smoking and alcohol 35 years ago.    OBJECTIVE Temp:  [97.9 F (36.6 C)-98.2 F (36.8 C)] 98.2 F (36.8 C) (10/02 1316) Pulse Rate:  [44-62] 62 (10/02 1316) Cardiac Rhythm:  [-]  Resp:  [18-20] 20 (10/02 1316) BP: (166-182)/(70-90) 182/90 mmHg (10/02 1316) SpO2:  [99 %-100 %] 100 % (10/02 1316) Weight:  [146 lb (66.225 kg)] 146 lb (66.225 kg) (10/02 0500)   Recent Labs Lab 05/22/14 1220 05/22/14 1229 05/22/14 2255 05/23/14 0117  NA 143 141  --  142  K 3.6* 3.3*  --  3.9  CL 103 107  --  108  CO2 24  --   --  21  GLUCOSE 98 99  --  91  BUN 13 12  --  10  CREATININE 0.83 0.90 0.73 0.72  CALCIUM 9.4  --   --  9.0    Recent Labs Lab  05/22/14 1220  AST 18  ALT 12  ALKPHOS 124*  BILITOT 0.2*  PROT 7.9  ALBUMIN 4.0    Recent Labs Lab 05/22/14 1220 05/22/14 1229 05/22/14 2255 05/23/14 0117  WBC 3.9*  --  4.5 4.6  NEUTROABS 2.6  --   --   --   HGB 13.9 16.0* 12.7 12.5  HCT 41.4 47.0* 37.1 36.2  MCV 86.6  --  83.9 83.8  PLT 220  --  230 219    Recent Labs Lab 05/22/14 2255 05/23/14 0117 05/23/14 1008  TROPONINI <0.30 <0.30 <0.30    Recent Labs  05/22/14 1220  LABPROT 13.0  INR 0.98    Recent Labs  05/22/14 1213  COLORURINE YELLOW  LABSPEC 1.012  PHURINE 6.5  GLUCOSEU NEGATIVE  HGBUR NEGATIVE  BILIRUBINUR NEGATIVE  KETONESUR NEGATIVE  PROTEINUR NEGATIVE  UROBILINOGEN 0.2  NITRITE NEGATIVE  LEUKOCYTESUR SMALL*       Component Value Date/Time   CHOL 253* 05/23/2014 0125   TRIG 56 05/23/2014 0125   TRIG 77 02/12/2014 1224   HDL 54 05/23/2014 0125   CHOLHDL 4.7 05/23/2014 0125   VLDL 11 05/23/2014 0125   LDLCALC 188* 05/23/2014 0125   LDLCALC 189* 02/12/2014 1224   Lab Results  Component Value Date   HGBA1C 5.7* 05/23/2014      Component Value Date/Time  LABOPIA NONE DETECTED 05/22/2014 1213   COCAINSCRNUR NONE DETECTED 05/22/2014 1213   LABBENZ NONE DETECTED 05/22/2014 1213   AMPHETMU NONE DETECTED 05/22/2014 1213   THCU NONE DETECTED 05/22/2014 1213   LABBARB NONE DETECTED 05/22/2014 1213     Recent Labs Lab 05/22/14 1220  ETH <11    Dg Chest 2 View 05/22/2014    No active cardiopulmonary disease.     Ct Head Wo Contrast 05/22/2014   No acute intracranial pathology.     Mr Brain Wo Contrast 05/22/2014     No acute intracranial process.  Severe white matter changes suggest sequelae of chronic small vessel ischemic disease. Dolicoectatic intracranial vessels can be seen with chronic hypertension.    Mr Jodene Nam Head/brain Wo Cm 05/22/2014   Moderately motion degraded examination. Luminal irregularity of the intracranial vessels suggests atherosclerosis with at least mild to moderate  distal basilar artery stenosis.     CUS - Bilateral: 1-39% ICA stenosis. Vertebral artery flow is antegrade.  2D echo - - Left ventricle: The cavity size was normal. Wall thickness was normal. Systolic function was vigorous. The estimated ejection fraction was in the range of 65% to 70%. Left ventricular diastolic function parameters were normal. - Pulmonary arteries: PA peak pressure: 32 mm Hg (S).  PHYSICAL EXAM  Temp:  [97.9 F (36.6 C)-98.2 F (36.8 C)] 98.2 F (36.8 C) (10/02 1316) Pulse Rate:  [44-62] 62 (10/02 1316) Resp:  [18-20] 20 (10/02 1316) BP: (166-182)/(70-90) 182/90 mmHg (10/02 1316) SpO2:  [99 %-100 %] 100 % (10/02 1316) Weight:  [146 lb (66.225 kg)] 146 lb (66.225 kg) (10/02 0500)  General - Well nourished, well developed, in no apparent distress.  Ophthalmologic - Sharp disc margins OU.  Cardiovascular - Regular rate and rhythm with no murmur.  Mental Status -  Level of arousal and orientation to time, place, and person were intact. Language including expression, naming, repetition, comprehension, reading, and writing was assessed and found intact.  Cranial Nerves II - XII - II - Visual field intact OU. III, IV, VI - Extraocular movements intact. V - Facial sensation intact bilaterally. VII - Facial movement intact bilaterally. VIII - Hearing & vestibular intact bilaterally. X - Palate elevates symmetrically. XI - Chin turning & shoulder shrug intact bilaterally. XII - Tongue protrusion intact.  Motor Strength - The patient's strength was normal in all extremities and pronator drift was absent.  Bulk was normal and fasciculations were absent.   Motor Tone - Muscle tone was assessed at the neck and appendages and was normal.  Reflexes - The patient's reflexes were normal in all extremities and she had no pathological reflexes.  Sensory - Light touch, temperature/pinprick were assessed and were normal.    Coordination - The patient had normal  movements in the hands and feet with no ataxia or dysmetria.  Tremor was absent.  Gait and Station - The patient's transfers, posture, gait, station, and turns were observed as normal.   ASSESSMENT/PLAN  [name] is a 80 y.o. female with history of hypertension and hyperlipidemia presenting with new onset speech output difficult, right-sided weakness and numbness. She did not receive IV t-PA due to rapidly resolving deficits. MRI imaging confirms no acute stroke.   TIA of left anterior circulation  MRI  No acute stroke  MRA  Intracerebral atherosclerosis  Carotid Doppler  unremarkable   2D Echo  unremarkable   no antithrombotics prior to admission, now on clopidogrel 75 mg orally every day. Continue Plavix at  discharge.  HgbA1c 5.7 WNL  Heparin 5000 units sq tid for VTE prophylaxis    thin liquids.   Activity as tolerated, OOB with assistance  Resultant neuro deficits resolved  Therapy recommendations:  home  Ongoing aggressive risk factor management  Risk factor education  Patient counseled to be compliant with her antithrombotic medications  Hypertension   Permissive hypertension <220/120 for 24-48 hours and then gradually normalized within 5-7 days.  BP goal long term normotensive  Stable  Patient counseled to be compliant with her blood pressure medications  Hyperlipidemia  Home meds:  Fish oil,  resumed in hospital.  Add lipitor 40 mg daily  LDL 188, goal < 70   Continue statin at discharge  Other Stroke Risk Factors Advanced age Former Cigarette smoker, stopped 40 years ago   Obesity, Body mass index is 30.52 kg/(m^2).    Family hx stroke (mother)  Hospital day # 2   Rosalin Hawking, MD PhD Stroke Neurology 05/24/2014 11:37 PM  To contact Stroke Continuity provider, please refer to [email or web address] After hours, contact General Neurology

## 2014-05-24 NOTE — Progress Notes (Signed)
SLP Cancellation Note  Patient Details Name: Stacy Page MRN: [id number] DOB: 29-May-1933   Cancelled treatment:       Reason Eval/Treat Not Completed: SLP screened, no needs identified, will sign off   Elizaveta Mattice, Katherene Ponto 05/24/2014, 11:39 AM

## 2014-05-24 NOTE — Progress Notes (Signed)
UR completed 

## 2014-05-24 NOTE — Progress Notes (Signed)
1500- per Dr. Allyson Sabal, ok to discharge pt. Nurse provided discharge and med instructions to pt. Pt verbally acknowledged understanding. Iv catheter removed by nurse without difficulty, intact. Pt tolerated well. Pt to call for family member to provide transport by private car to home. Will monitor  Stacy Page I 05/24/2014 3:30 PM

## 2014-05-27 ENCOUNTER — Ambulatory Visit: Payer: Medicare Other | Admitting: Interventional Cardiology

## 2014-05-29 ENCOUNTER — Encounter (HOSPITAL_COMMUNITY): Payer: Self-pay | Admitting: Emergency Medicine

## 2014-05-29 ENCOUNTER — Emergency Department (HOSPITAL_COMMUNITY): Payer: Medicare Other

## 2014-05-29 ENCOUNTER — Emergency Department (HOSPITAL_COMMUNITY)
Admission: EM | Admit: 2014-05-29 | Discharge: 2014-05-30 | Disposition: A | Discharge status: Home / Self Care | Payer: Medicare Other | Attending: Emergency Medicine | Admitting: Emergency Medicine

## 2014-05-29 DIAGNOSIS — E785 Hyperlipidemia, unspecified: Secondary | ICD-10-CM | POA: Diagnosis not present

## 2014-05-29 DIAGNOSIS — Z79899 Other long term (current) drug therapy: Secondary | ICD-10-CM | POA: Insufficient documentation

## 2014-05-29 DIAGNOSIS — Z8673 Personal history of transient ischemic attack (TIA), and cerebral infarction without residual deficits: Secondary | ICD-10-CM | POA: Diagnosis not present

## 2014-05-29 DIAGNOSIS — J45909 Unspecified asthma, uncomplicated: Secondary | ICD-10-CM | POA: Diagnosis not present

## 2014-05-29 DIAGNOSIS — Z7951 Long term (current) use of inhaled steroids: Secondary | ICD-10-CM | POA: Insufficient documentation

## 2014-05-29 DIAGNOSIS — Z87891 Personal history of nicotine dependence: Secondary | ICD-10-CM | POA: Insufficient documentation

## 2014-05-29 DIAGNOSIS — K219 Gastro-esophageal reflux disease without esophagitis: Secondary | ICD-10-CM | POA: Diagnosis not present

## 2014-05-29 DIAGNOSIS — R42 Dizziness and giddiness: Secondary | ICD-10-CM | POA: Diagnosis present

## 2014-05-29 DIAGNOSIS — I10 Essential (primary) hypertension: Secondary | ICD-10-CM | POA: Diagnosis not present

## 2014-05-29 LAB — BASIC METABOLIC PANEL
Anion gap: 12 (ref 5–15)
BUN: 17 mg/dL (ref 6–23)
CALCIUM: 9.4 mg/dL (ref 8.4–10.5)
CO2: 25 meq/L (ref 19–32)
CREATININE: 1.09 mg/dL (ref 0.50–1.10)
Chloride: 102 mEq/L (ref 96–112)
GFR calc Af Amer: 54 mL/min — ABNORMAL LOW (ref >90)
GFR calc non Af Amer: 47 mL/min — ABNORMAL LOW (ref >90)
Glucose, Bld: 135 mg/dL — ABNORMAL HIGH (ref 70–99)
Potassium: 3.8 mEq/L (ref 3.7–5.3)
Sodium: 139 mEq/L (ref 137–147)

## 2014-05-29 LAB — URINALYSIS, ROUTINE W REFLEX MICROSCOPIC
BILIRUBIN URINE: NEGATIVE
Glucose, UA: NEGATIVE mg/dL
Hgb urine dipstick: NEGATIVE
KETONES UR: NEGATIVE mg/dL
Nitrite: NEGATIVE
PH: 5.5 (ref 5.0–8.0)
Protein, ur: NEGATIVE mg/dL
SPECIFIC GRAVITY, URINE: 1.011 (ref 1.005–1.030)
Urobilinogen, UA: 0.2 mg/dL (ref 0.0–1.0)

## 2014-05-29 LAB — CBC
HEMATOCRIT: 41.3 % (ref 36.0–46.0)
Hemoglobin: 14 g/dL (ref 12.0–15.0)
MCH: 28.9 pg (ref 26.0–34.0)
MCHC: 33.9 g/dL (ref 30.0–36.0)
MCV: 85.2 fL (ref 78.0–100.0)
Platelets: 241 10*3/uL (ref 150–400)
RBC: 4.85 MIL/uL (ref 3.87–5.11)
RDW: 14.2 % (ref 11.5–15.5)
WBC: 4.9 10*3/uL (ref 4.0–10.5)

## 2014-05-29 LAB — I-STAT TROPONIN, ED: Troponin i, poc: 0.01 ng/mL (ref 0.00–0.08)

## 2014-05-29 LAB — URINE MICROSCOPIC-ADD ON

## 2014-05-29 MED ORDER — SODIUM CHLORIDE 0.9 % IV BOLUS (SEPSIS)
500.0000 mL | Freq: Once | INTRAVENOUS | Status: AC
  Administered 2014-05-29: 500 mL via INTRAVENOUS

## 2014-05-29 NOTE — ED Notes (Signed)
Pt getting undressed and into a gown at this time; family at bedside

## 2014-05-29 NOTE — ED Notes (Signed)
Pt was ambulated in hall. Patient had steady gait, complained of light headedness. Gave pt peanut butter and graham crackers.

## 2014-05-29 NOTE — ED Notes (Signed)
Docherty at the bedside.

## 2014-05-29 NOTE — ED Notes (Signed)
Pt placed on monitor, continuous pulse oximetry and blood pressure cuff; family at bedside

## 2014-05-29 NOTE — Discharge Instructions (Signed)
Call for a follow up appointment with a Family or Primary Care Provider.  Return if Symptoms worsen.   Take medication as prescribed.  Eat a well balanced diet.

## 2014-05-29 NOTE — ED Provider Notes (Signed)
CSN: [id number]     Arrival date & time 05/29/14  1543 History   First MD Initiated Contact with Patient 05/29/14 1639     Chief Complaint  Patient presents with  . Dizziness     (Consider location/radiation/quality/duration/timing/severity/associated sxs/prior Treatment) HPI Comments: The patient is a 80 year old female past no history of hypertension, reflux, TIA presents emergency room chief complaint of lightheadedness since today. Patient reports lightheadedness increased with movement. Denies syncope. Denies visual changes. Patient reports compliance with hypertension medication. Reports her home blood pressures 180/100, since yesterday 120s/80's. Patient also reports decrease in appetite and oral intake since being discharge. She reports eating 1 tomato sandwich  and 6 ounces of water at approximately 11 AM.  The history is provided by the patient. No language interpreter was used.    Past Medical History  Diagnosis Date  . Hypertension   . Hyperlipidemia   . Allergy     seasonal  . Asthma   . GERD (gastroesophageal reflux disease)    Past Surgical History  Procedure Laterality Date  . Bunionectomy    . Carpal tunnel release      Bilaterally  . Abdominal hysterectomy      for fibroids (no BSO)  . Macular degeneration      Dr Zadie Rhine  ; injections  . Colonoscopy  2011    tiny polyp ; [facility]   Family History  Problem Relation Age of Onset  . Alzheimer's disease Mother   . Hypertension Mother   . Stroke Mother 36  . Hypertension Father   . Heart attack Father     in 96's  . Hypertension Maternal Grandmother   . Heart attack Maternal Grandmother 73  . Diabetes      1/2 sister   History  Substance Use Topics  . Smoking status: Former Smoker    Types: Cigarettes    Quit date: 08/24/1979  . Smokeless tobacco: Never Used     Comment: smoked  1966-1981, up to 1 ppd  . Alcohol Use: No   OB History   Grav Para Term Preterm Abortions TAB SAB Ect Mult Living                 Review of Systems  Constitutional: Negative for fever and chills.  Respiratory: Negative for cough, chest tightness and shortness of breath.   Cardiovascular: Negative for chest pain and leg swelling.  Gastrointestinal: Negative for nausea, vomiting, abdominal pain, diarrhea and constipation.  Neurological: Positive for light-headedness. Negative for dizziness, syncope, facial asymmetry, weakness and numbness.      Allergies  Cardizem; Aspirin; Metoclopramide hcl; Spironolactone; and Amlodipine besylate  Home Medications   Prior to Admission medications   Medication Sig Start Date End Date Taking? Authorizing Provider  albuterol (PROVENTIL HFA;VENTOLIN HFA) 108 (90 BASE) MCG/ACT inhaler Inhale 2 puffs into the lungs every 6 (six) hours as needed for wheezing or shortness of breath (asthma).    Historical Provider, MD  Ascorbic Acid (VITAMIN C) 500 MG tablet Take 500 mg by mouth daily.      Historical Provider, MD  atorvastatin (LIPITOR) 40 MG tablet Take 1 tablet (40 mg total) by mouth daily at 6 PM. 05/24/14   Reyne Dumas, MD  b complex vitamins tablet Take 1 tablet by mouth daily.     Historical Provider, MD  Calcium Carbonate (CALTRATE 600 PO) Take 1,200 mg by mouth daily.     Historical Provider, MD  Cholecalciferol (VITAMIN D3) 2000 UNITS TABS Take 2,000 Units  by mouth daily.      Historical Provider, MD  clopidogrel (PLAVIX) 75 MG tablet Take 1 tablet (75 mg total) by mouth daily. 05/24/14   Reyne Dumas, MD  co-enzyme Q-10 30 MG capsule Take 30 mg by mouth daily.     Historical Provider, MD  cycloSPORINE (RESTASIS) 0.05 % ophthalmic emulsion Place 1 drop into both eyes 2 (two) times daily.    Historical Provider, MD  fexofenadine (ALLEGRA) 180 MG tablet Take 180 mg by mouth daily as needed for allergies (allergies).     Historical Provider, MD  fish oil-omega-3 fatty acids 1000 MG capsule Take 1 g by mouth daily.      Historical Provider, MD  fluticasone (FLONASE)  50 MCG/ACT nasal spray Place 2 sprays into both nostrils daily.    Historical Provider, MD  furosemide (LASIX) 40 MG tablet Take 40 mg by mouth daily.    Historical Provider, MD  gabapentin (NEURONTIN) 100 MG capsule Take 100 mg by mouth 3 (three) times daily. For leg pain    Historical Provider, MD  Garlic 409 MG TABS Take 300 mg by mouth daily.      Historical Provider, MD  guaiFENesin-dextromethorphan (ROBITUSSIN DM) 100-10 MG/5ML syrup Take 5 mLs by mouth every 4 (four) hours as needed for cough. 05/24/14   Reyne Dumas, MD  [place] 300 MG TABS Take 300 mg by mouth 2 (two) times daily.     Historical Provider, MD  hydrALAZINE (APRESOLINE) 100 MG tablet Take 1 tablet (100 mg total) by mouth 3 (three) times daily. 05/24/14   Reyne Dumas, MD  lisinopril (PRINIVIL) 10 MG tablet Take 1 tablet (10 mg total) by mouth daily. 05/24/14   Reyne Dumas, MD  LORazepam (ATIVAN) 0.5 MG tablet Take 0.5 mg by mouth every 8 (eight) hours as needed for anxiety (anxiety).    Historical Provider, MD  meclizine (ANTIVERT) 25 MG tablet Take 12.5 mg by mouth 3 (three) times daily as needed for dizziness (dizziness).    Historical Provider, MD  olmesartan (BENICAR) 40 MG tablet Take 40 mg by mouth daily.    Historical Provider, MD  pantoprazole (PROTONIX) 20 MG tablet Take 1 tablet (20 mg total) by mouth daily. 12/21/13   Hendricks Limes, MD  potassium chloride (K-DUR,KLOR-CON) 10 MEQ tablet Take 10 mEq by mouth 2 (two) times daily.    Historical Provider, MD   BP 125/72  Pulse 75  Temp(Src) 98.1 F (36.7 C) (Oral)  Resp 18  Ht 4\' 10"  (1.473 m)  Wt 144 lb (65.318 kg)  BMI 30.10 kg/m2  SpO2 99% Physical Exam  Constitutional: She is oriented to person, place, and time. She appears well-developed and well-nourished. No distress.  HENT:  Head: Normocephalic and atraumatic.  Eyes: EOM are normal. Pupils are equal, round, and reactive to light.  Neck: Neck supple.  Cardiovascular: Normal rate and regular  rhythm.   Trace pitting edema  Pulmonary/Chest: Not tachypneic. No respiratory distress. She has no decreased breath sounds. She has no wheezes. She has no rhonchi.  Abdominal: Soft. There is no tenderness. There is no rebound and no guarding.  Neurological: She is alert and oriented to person, place, and time. She is not disoriented. No cranial nerve deficit or sensory deficit. She exhibits normal muscle tone. Coordination normal. GCS eye subscore is 4. GCS verbal subscore is 5. GCS motor subscore is 6.  Speech is clear and goal oriented, follows commands II-Visual fields were normal.   III/IV/VI-Pupils were equal and  reacted. Extraocular movements were full and conjugate.  V/VII-no facial droop.   VIII-normal.   Motor: Strength 5/5 to upper and lower extremities bilaterally. Moves all 4 extremities equally. Sensory: normal sensation to upper and lower extremities.  Cerebellar: Normal finger to nose bilaterally No pronator drift.  Skin: She is not diaphoretic.    ED Course  Procedures (including critical care time) Labs Review Labs Reviewed  BASIC METABOLIC PANEL - Abnormal; Notable for the following:    Glucose, Bld 135 (*)    GFR calc non Af Amer 47 (*)    GFR calc Af Amer 54 (*)    All other components within normal limits  URINALYSIS, ROUTINE W REFLEX MICROSCOPIC - Abnormal; Notable for the following:    Leukocytes, UA SMALL (*)    All other components within normal limits  URINE MICROSCOPIC-ADD ON - Abnormal; Notable for the following:    Squamous Epithelial / LPF FEW (*)    All other components within normal limits  URINE CULTURE  CBC  I-STAT TROPOININ, ED    Imaging Review Dg Chest 2 View  05/29/2014   CLINICAL DATA:  Weakness and dizziness beginning this morning.  EXAM: CHEST  2 VIEW  COMPARISON:  PA and lateral chest 05/22/2014.  FINDINGS: The lungs are clear. Heart size is normal. There is no pneumothorax or pleural effusion. No focal bony abnormality is identified.   IMPRESSION: No acute disease.   Electronically Signed   By: Inge Rise M.D.   On: 05/29/2014 19:30   Ct Head Wo Contrast  05/29/2014   CLINICAL DATA:  Lightheadedness and weakness today. Dizziness. Initial encounter.  EXAM: CT HEAD WITHOUT CONTRAST  TECHNIQUE: Contiguous axial images were obtained from the base of the skull through the vertex without intravenous contrast.  COMPARISON:  MRI brain 05/22/2014.  FINDINGS: Extensive periventricular and subcortical white matter changes are again noted. No acute cortical infarct, hemorrhage, or mass lesion is present. The ventricles are stable in size. No significant extra-axial fluid collection is present.  The paranasal sinuses and mastoid air cells are clear.  IMPRESSION: 1. Stable moderate atrophy and diffuse white matter disease. 2. No acute intracranial abnormality or significant interval change.   Electronically Signed   By: Lawrence Santiago M.D.   On: 05/29/2014 23:31     EKG Interpretation None      MDM   Final diagnoses:  Lightheadedness   Patient presents with lightheadedness no other neurologic complaints, was reports decrease in oral intake since discharge from hospital for TIA  work up, discharged 05/24/2014.  EMR shows BP 9/30 206/101 today 125/72, reports light headedness. Likely secondary to antihypertensive medication. Patient is able to ambulate without assistance, reports lightheadedness. Dr. Tawnya Crook also evaluated the patient during this encounter. 8:35 PM Pt reports symptom improvement requesting to be discharged. Patient was ambulated and had some lightheadedness. 2136 Paged neurology Reevaluation patient resting comfortably in room, declines food reports full after eating crackers and peanut butter. 2247 Repaged neurology 2254 Discussed patient history, condition with previous workup for TIA with Dr. Armida Sans, advises CT, if normal followup as an outpatient. Negative CT. Evaluation patient resting comfortably in room.  Discussed lab results, imaging results, and treatment plan with the patient. Return precautions given. Reports understanding and no other concerns at this time.  Patient is stable for discharge at this time. Meds given in ED:  Medications  sodium chloride 0.9 % bolus 500 mL (0 mLs Intravenous Stopped 05/29/14 2109)    New Prescriptions  No medications on file        Harvie Heck, PA-C 05/30/14 0012

## 2014-05-29 NOTE — ED Notes (Signed)
Patient returned from XRay

## 2014-05-29 NOTE — ED Notes (Signed)
Pt reports feeling lightheaded and weak today. Pt was just admitted into hospital recently for htn and weakness. MD increased her hydralazine from 50mg  to 100mg .

## 2014-05-29 NOTE — ED Notes (Signed)
PA at the bedside.

## 2014-05-30 LAB — URINE CULTURE: Special Requests: NORMAL

## 2014-05-30 NOTE — ED Provider Notes (Signed)
Medical screening examination/treatment/procedure(s) were conducted as a shared visit with non-physician practitioner(s) and myself.  I personally evaluated the patient during the encounter. Patient presents with a this and affect with change of position starting today. She denies syncope. She said chest pain, numbness or weakness. She's no focal neuro findings on exam there is persistently lightheaded with change of position. She's had a recent TIA workup. She's recently started on new antihypertensives and has not been eating or drinking as much as usual. W likely due to the large change in her blood pressure. we spoke with Dr. Aram Beecham who advised a CT which was negative, in which case he recommended discharge with close outpatient followup. I do not feel strongly that she is likely had a CVA or TIA and). Return precautions given for new worsening symptoms..  EKG Interpretation None        Ernestina Patches, MD 05/30/14 [phone]

## 2014-06-04 ENCOUNTER — Encounter: Payer: Self-pay | Admitting: Internal Medicine

## 2014-06-04 ENCOUNTER — Ambulatory Visit (INDEPENDENT_AMBULATORY_CARE_PROVIDER_SITE_OTHER): Payer: Medicare Other | Admitting: Internal Medicine

## 2014-06-04 VITALS — BP 138/94 | HR 58 | Temp 98.7°F | Resp 14 | Wt 144.5 lb

## 2014-06-04 DIAGNOSIS — G459 Transient cerebral ischemic attack, unspecified: Secondary | ICD-10-CM

## 2014-06-04 DIAGNOSIS — I10 Essential (primary) hypertension: Secondary | ICD-10-CM

## 2014-06-04 DIAGNOSIS — E782 Mixed hyperlipidemia: Secondary | ICD-10-CM

## 2014-06-04 NOTE — Progress Notes (Signed)
   Subjective:    Patient ID: Stacy Page, female    DOB: [birth year], 80 y.o.   MRN: [id number]  HPI  The hospital records 9/30-10/2/15 were reviewed. She had an acute anterior circulation TIA without stroke. This presented as right lower extremity numbness and balance dysfunction. She also has some confusion  MRA revealed mild to moderate intracerebral atherosclerosis.   Plavix was initiated. The Bystolic was held because of bradycardia. She was to continue hydralazine 100 mg 3 times a day.  Because of an LDL of 188 she was placed on Lipitor 40 mg daily  2 echocardiogram was pending at time of discharge. Ejection fraction was 65-70%. No definite or significant valvular changes were noted  She was seen in the emergency room 10/7  for lightheadedness and decreased appetite.  CT scan revealed diffuse atrophy and white matter disease.   Review of Systems  She has decreased her hydralazine to 50 mg 3 times a day because she felt the higher dose made her head feel as if it were expanding"  She did not take the Plavix; she is not sure why  She did not take atorvastatin ; she felt it made her dizzy.  At home blood pressures range 130/60-147/74 for the most part. Rarely it will be as high as 180/100.       Objective:   Physical Exam   Positive or pertinent findings include: She appears dramatically younger than age She does have decreased posterior tibial pulses. She is wearing support stockings but still has tense edema.  Appears healthy and well-nourished & in no acute distress No carotid bruits are present.No neck vein distention present at 10 - 15 degrees. Thyroid normal to palpation Heart rhythm and rate are normal with no gallop or murmur Chest is clear with no increased work of breathing There is no evidence of aortic aneurysm or renal artery bruits Abdomen soft with no organomegaly or masses. No HJR No clubbing, cyanosis  present. Pedal pulses are intact but  decreased No ischemic skin changes are present . Fingernails healthy  Alert and oriented. Strength, tone, DTRs reflexes normal           Assessment & Plan:  #! Status post TIA  #2 hypertension, control improved  #3 dyslipidemia  #4 non compliance with meds  Plan: Risk:benefit of medications was discussed.Med changes listed in the after visit summary. She will see with Dr. Tamala Julian, Cardiologist in December.

## 2014-06-04 NOTE — Patient Instructions (Signed)
The Neurologist felt Plavix would help prevent another transient ischemic attack or mini stroke. I would recommend you consider taking this  As long as your average blood pressures is at goal (see below) I would continue hydralazine 50 mg 3 times a day  The bad cholesterol is 188 which is extremely high. A reasonable compromise would be to take half of a 40 mg pill Monday, Wednesday, Friday, and Sunday.  After 10-12 weeks the cholesterol values to be rechecked.   Minimal Blood Pressure Goal= AVERAGE < 150/100;  Ideal is an AVERAGE < 140/90. This AVERAGE should be calculated from @ least 5-7 BP readings taken @ different times of day on different days of week. You should not respond to isolated BP readings , but rather the AVERAGE for that week .Please bring your  blood pressure cuff to office visits to verify that it is reliable.It  can also be checked against the blood pressure device at the pharmacy. Finger or wrist cuffs are not dependable; an arm cuff is.

## 2014-06-04 NOTE — Progress Notes (Signed)
Pre visit review using our clinic review tool, if applicable. No additional management support is needed unless otherwise documented below in the visit note. 

## 2014-06-05 ENCOUNTER — Other Ambulatory Visit: Payer: Self-pay | Admitting: Internal Medicine

## 2014-06-05 ENCOUNTER — Telehealth: Payer: Self-pay | Admitting: Internal Medicine

## 2014-06-05 DIAGNOSIS — E782 Mixed hyperlipidemia: Secondary | ICD-10-CM

## 2014-06-05 NOTE — Telephone Encounter (Signed)
[facility] EMAILED  °

## 2014-06-13 ENCOUNTER — Other Ambulatory Visit: Payer: Self-pay | Admitting: Internal Medicine

## 2014-06-13 ENCOUNTER — Emergency Department (HOSPITAL_COMMUNITY)
Admission: EM | Admit: 2014-06-13 | Discharge: 2014-06-14 | Disposition: A | Discharge status: Home / Self Care | Payer: Medicare Other | Attending: Emergency Medicine | Admitting: Emergency Medicine

## 2014-06-13 ENCOUNTER — Emergency Department (HOSPITAL_COMMUNITY): Payer: Medicare Other

## 2014-06-13 ENCOUNTER — Encounter (HOSPITAL_COMMUNITY): Payer: Self-pay | Admitting: Emergency Medicine

## 2014-06-13 DIAGNOSIS — I10 Essential (primary) hypertension: Secondary | ICD-10-CM | POA: Diagnosis present

## 2014-06-13 DIAGNOSIS — J45909 Unspecified asthma, uncomplicated: Secondary | ICD-10-CM | POA: Diagnosis not present

## 2014-06-13 DIAGNOSIS — E785 Hyperlipidemia, unspecified: Secondary | ICD-10-CM | POA: Insufficient documentation

## 2014-06-13 DIAGNOSIS — K219 Gastro-esophageal reflux disease without esophagitis: Secondary | ICD-10-CM | POA: Insufficient documentation

## 2014-06-13 DIAGNOSIS — IMO0001 Reserved for inherently not codable concepts without codable children: Secondary | ICD-10-CM

## 2014-06-13 DIAGNOSIS — Z79899 Other long term (current) drug therapy: Secondary | ICD-10-CM | POA: Diagnosis not present

## 2014-06-13 DIAGNOSIS — Z87891 Personal history of nicotine dependence: Secondary | ICD-10-CM | POA: Diagnosis not present

## 2014-06-13 DIAGNOSIS — Z7902 Long term (current) use of antithrombotics/antiplatelets: Secondary | ICD-10-CM | POA: Diagnosis not present

## 2014-06-13 DIAGNOSIS — R030 Elevated blood-pressure reading, without diagnosis of hypertension: Secondary | ICD-10-CM

## 2014-06-13 DIAGNOSIS — Z7951 Long term (current) use of inhaled steroids: Secondary | ICD-10-CM | POA: Diagnosis not present

## 2014-06-13 DIAGNOSIS — J0100 Acute maxillary sinusitis, unspecified: Secondary | ICD-10-CM | POA: Insufficient documentation

## 2014-06-13 LAB — COMPREHENSIVE METABOLIC PANEL
ALK PHOS: 132 U/L — AB (ref 39–117)
ALT: 15 U/L (ref 0–35)
ANION GAP: 13 (ref 5–15)
AST: 18 U/L (ref 0–37)
Albumin: 3.9 g/dL (ref 3.5–5.2)
BILIRUBIN TOTAL: 0.2 mg/dL — AB (ref 0.3–1.2)
BUN: 10 mg/dL (ref 6–23)
CHLORIDE: 101 meq/L (ref 96–112)
CO2: 24 meq/L (ref 19–32)
CREATININE: 0.83 mg/dL (ref 0.50–1.10)
Calcium: 9.4 mg/dL (ref 8.4–10.5)
GFR, EST AFRICAN AMERICAN: 76 mL/min — AB (ref >90)
GFR, EST NON AFRICAN AMERICAN: 65 mL/min — AB (ref >90)
GLUCOSE: 106 mg/dL — AB (ref 70–99)
POTASSIUM: 3.8 meq/L (ref 3.7–5.3)
Sodium: 138 mEq/L (ref 137–147)
Total Protein: 7.7 g/dL (ref 6.0–8.3)

## 2014-06-13 LAB — CBC WITH DIFFERENTIAL/PLATELET
Basophils Absolute: 0 10*3/uL (ref 0.0–0.1)
Basophils Relative: 1 % (ref 0–1)
Eosinophils Absolute: 0.1 10*3/uL (ref 0.0–0.7)
Eosinophils Relative: 2 % (ref 0–5)
HEMATOCRIT: 38.1 % (ref 36.0–46.0)
HEMOGLOBIN: 12.8 g/dL (ref 12.0–15.0)
LYMPHS ABS: 1.7 10*3/uL (ref 0.7–4.0)
LYMPHS PCT: 33 % (ref 12–46)
MCH: 28.6 pg (ref 26.0–34.0)
MCHC: 33.6 g/dL (ref 30.0–36.0)
MCV: 85.2 fL (ref 78.0–100.0)
MONOS PCT: 10 % (ref 3–12)
Monocytes Absolute: 0.5 10*3/uL (ref 0.1–1.0)
NEUTROS ABS: 2.8 10*3/uL (ref 1.7–7.7)
NEUTROS PCT: 54 % (ref 43–77)
Platelets: 252 10*3/uL (ref 150–400)
RBC: 4.47 MIL/uL (ref 3.87–5.11)
RDW: 13.8 % (ref 11.5–15.5)
WBC: 5.2 10*3/uL (ref 4.0–10.5)

## 2014-06-13 MED ORDER — HYDRALAZINE HCL 20 MG/ML IJ SOLN
5.0000 mg | Freq: Once | INTRAMUSCULAR | Status: AC
  Administered 2014-06-13: 5 mg via INTRAVENOUS
  Filled 2014-06-13: qty 1

## 2014-06-13 MED ORDER — SODIUM CHLORIDE 0.9 % IV BOLUS (SEPSIS)
500.0000 mL | Freq: Once | INTRAVENOUS | Status: AC
  Administered 2014-06-13: 500 mL via INTRAVENOUS

## 2014-06-13 MED ORDER — LABETALOL HCL 5 MG/ML IV SOLN: 10.0000 mg | Freq: Once | INTRAVENOUS | Status: DC

## 2014-06-13 NOTE — ED Provider Notes (Signed)
CSN: [id number]     Arrival date & time 06/13/14  2047 History   First MD Initiated Contact with Patient 06/13/14 2132     Chief Complaint  Patient presents with  . Hypertension     (Consider location/radiation/quality/duration/timing/severity/associated sxs/prior Treatment) HPI  Stacy Page is a 80 y.o. female with past medical history significant for hypertension, hyperlipidemia complaining of lightheaded sensation when going from sitting to standing onset this morning when waking. Patient has been checking her blood pressure all day (she is compliant with her hypertension medications, blood pressure started to rise in the late afternoon. She became concerned and presented to the ED for evaluation. Patient denies headache, change in vision, dysarthria, ataxia, confusion, cervicalgia, nausea vomiting, numbness, unilateral weakness, chest pain, shortness of breath, abdominal pain, decreased by mouth intake. She reports that she has had issues with rhinorrhea and nasal congestion, she's been taking over-the-counter antihistamines for the past several days with little relief. She has a mild aching behind the left ear.   Past Medical History  Diagnosis Date  . Hypertension   . Hyperlipidemia   . Allergy     seasonal  . Asthma   . GERD (gastroesophageal reflux disease)    Past Surgical History  Procedure Laterality Date  . Bunionectomy    . Carpal tunnel release      Bilaterally  . Abdominal hysterectomy      for fibroids (no BSO)  . Macular degeneration      Dr Zadie Rhine  ; injections  . Colonoscopy  2011    tiny polyp ; [facility]   Family History  Problem Relation Age of Onset  . Alzheimer's disease Mother   . Hypertension Mother   . Stroke Mother 14  . Hypertension Father   . Heart attack Father     in 76's  . Hypertension Maternal Grandmother   . Heart attack Maternal Grandmother 73  . Diabetes      1/2 sister   History  Substance Use Topics  . Smoking status:  Former Smoker    Types: Cigarettes    Quit date: 08/24/1979  . Smokeless tobacco: Never Used     Comment: smoked  1966-1981, up to 1 ppd  . Alcohol Use: No   OB History   Grav Para Term Preterm Abortions TAB SAB Ect Mult Living                 Review of Systems  10 systems reviewed and found to be negative, except as noted in the HPI.   Allergies  Aspirin; Cardizem; Amlodipine besylate; Metoclopramide hcl; and Spironolactone  Home Medications   Prior to Admission medications   Medication Sig Start Date End Date Taking? Authorizing Provider  albuterol (PROVENTIL HFA;VENTOLIN HFA) 108 (90 BASE) MCG/ACT inhaler Inhale 2 puffs into the lungs every 6 (six) hours as needed for wheezing or shortness of breath (asthma).   Yes Historical Provider, MD  Ascorbic Acid (VITAMIN C) 500 MG tablet Take 500 mg by mouth daily.     Yes Historical Provider, MD  atorvastatin (LIPITOR) 40 MG tablet 1/2 pill M,W, F, & Sun 06/05/14  Yes Hendricks Limes, MD  Calcium Carbonate (CALTRATE 600 PO) Take 1,200 mg by mouth daily.    Yes Historical Provider, MD  Cholecalciferol (VITAMIN D3) 2000 UNITS capsule Take 2,000 Units by mouth daily.   Yes Historical Provider, MD  clopidogrel (PLAVIX) 75 MG tablet Take 1 tablet (75 mg total) by mouth daily. 05/24/14  Yes Nayana  Abrol, MD  co-enzyme Q-10 30 MG capsule Take 30 mg by mouth daily.    Yes Historical Provider, MD  cycloSPORINE (RESTASIS) 0.05 % ophthalmic emulsion Place 1 drop into both eyes 2 (two) times daily.   Yes Historical Provider, MD  FEVERFEW PO Take 2 capsules by mouth daily. For vision   Yes Historical Provider, MD  fexofenadine (ALLEGRA) 180 MG tablet Take 180 mg by mouth daily as needed for allergies (allergies).    Yes Historical Provider, MD  Fish Oil-Cholecalciferol (FISH OIL + D3 PO) Take 2 capsules by mouth daily.   Yes Historical Provider, MD  fluticasone (FLONASE) 50 MCG/ACT nasal spray Place 2 sprays into both nostrils daily.   Yes  Historical Provider, MD  furosemide (LASIX) 40 MG tablet Take 20 mg by mouth daily.    Yes Historical Provider, MD  gabapentin (NEURONTIN) 100 MG capsule Take 100 mg by mouth 2 (two) times daily. For leg pain   Yes Historical Provider, MD  [place] 300 MG TABS Take 600 mg by mouth daily.    Yes Historical Provider, MD  hydrALAZINE (APRESOLINE) 100 MG tablet 1/2 pill (total of 50 mg) tid 06/05/14  Yes Hendricks Limes, MD  lisinopril (PRINIVIL) 10 MG tablet Take 1 tablet (10 mg total) by mouth daily. 05/24/14  Yes Reyne Dumas, MD  LORazepam (ATIVAN) 0.5 MG tablet Take 0.5 mg by mouth every 8 (eight) hours as needed for anxiety (anxiety).   Yes Historical Provider, MD  meclizine (ANTIVERT) 25 MG tablet Take 12.5-25 mg by mouth 3 (three) times daily as needed for dizziness (dizziness).    Yes Historical Provider, MD  olmesartan (BENICAR) 40 MG tablet Take 40 mg by mouth daily.   Yes Historical Provider, MD  pantoprazole (PROTONIX) 20 MG tablet Take 1 tablet (20 mg total) by mouth daily. 12/21/13  Yes Hendricks Limes, MD  potassium chloride (K-DUR,KLOR-CON) 10 MEQ tablet Take 10 mEq by mouth 2 (two) times daily.   Yes Historical Provider, MD  fluticasone (FLONASE) 50 MCG/ACT nasal spray Place 2 sprays into both nostrils daily. 06/14/14   Brayley Mackowiak, PA-C   BP 195/101  Pulse 57  Temp(Src) 98.3 F (36.8 C) (Oral)  Resp 18  Ht 4\' 10"  (1.473 m)  Wt 144 lb (65.318 kg)  BMI 30.10 kg/m2  SpO2 99% Physical Exam  Nursing note and vitals reviewed. Constitutional: She is oriented to person, place, and time. She appears well-developed and well-nourished. No distress.  HENT:  Head: Normocephalic and atraumatic.  Mouth/Throat: Oropharynx is clear and moist.  No mastoid tenderness palpation bilaterally, tympanic membrane with normal architecture and good light reflex bilaterally.  Bilateral maxillary sinusitis with tenderness to palpation  Eyes: Conjunctivae and EOM are normal. Pupils are  equal, round, and reactive to light.  Neck: Normal range of motion.  Cardiovascular: Normal rate, regular rhythm and intact distal pulses.   Pulmonary/Chest: Effort normal and breath sounds normal. No stridor. No respiratory distress. She has no wheezes. She has no rales. She exhibits no tenderness.  Abdominal: Soft. Bowel sounds are normal. She exhibits no distension and no mass. There is no tenderness. There is no rebound and no guarding.  Musculoskeletal: Normal range of motion. She exhibits no edema and no tenderness.  Neurological: She is alert and oriented to person, place, and time.  II-Visual fields grossly intact. III/IV/VI-Extraocular movements intact.  Pupils reactive bilaterally. V/VII-Smile symmetric, equal eyebrow raise,  facial sensation intact VIII- Hearing grossly intact IX/X-Normal gag XI-bilateral shoulder shrug  XII-midline tongue extension Motor: 5/5 bilaterally with normal tone and bulk Cerebellar: Normal finger-to-nose  and normal heel-to-shin test.   Romberg negative Ambulates with a coordinated gait   Psychiatric: She has a normal mood and affect.    ED Course  Procedures (including critical care time) Labs Review Labs Reviewed  COMPREHENSIVE METABOLIC PANEL - Abnormal; Notable for the following:    Glucose, Bld 106 (*)    Alkaline Phosphatase 132 (*)    Total Bilirubin 0.2 (*)    GFR calc non Af Amer 65 (*)    GFR calc Af Amer 76 (*)    All other components within normal limits  CBC WITH DIFFERENTIAL    Imaging Review Ct Head Wo Contrast  06/13/2014   CLINICAL DATA:  Initial valuation for acute dizziness. History of hypertension.  EXAM: CT HEAD WITHOUT CONTRAST  TECHNIQUE: Contiguous axial images were obtained from the base of the skull through the vertex without intravenous contrast.  COMPARISON:  None.  FINDINGS: Confluent hypodensity within the periventricular and deep white matter both cerebral hemispheres again seen, most compatible with moderate  chronic small vessel ischemic changes.  There is no acute intracranial hemorrhage or infarct. No mass lesion or midline shift. Gray-white matter differentiation is well maintained. Ventricles are normal in size without evidence of hydrocephalus. CSF containing spaces are within normal limits. No extra-axial fluid collection.  The calvarium is intact.  Orbital soft tissues are within normal limits.  The paranasal sinuses and mastoid air cells are well pneumatized and free of fluid.  Scalp soft tissues are unremarkable.  IMPRESSION: 1. No acute intracranial process. 2. Moderate chronic small vessel ischemic disease.   Electronically Signed   By: Jeannine Boga M.D.   On: 06/13/2014 23:36     EKG Interpretation   Date/Time:  Thursday June 13 2014 21:39:39 EDT Ventricular Rate:  65 PR Interval:  134 QRS Duration: 86 QT Interval:  400 QTC Calculation: 416 R Axis:   11 Text Interpretation:  Age not entered, assumed to be  80 years old for  purpose of ECG interpretation Sinus rhythm No significant change since  last tracing Confirmed by GOLDSTON  MD, SCOTT ([id number]) on 06/13/2014  11:48:52 PM      MDM   Final diagnoses:  Elevated BP    Filed Vitals:   06/13/14 2245 06/13/14 2300 06/13/14 2303 06/14/14 0015  BP: 182/105 193/87 193/87 195/101  Pulse: 77 61  57  Temp:    98.3 F (36.8 C)  TempSrc:    Oral  Resp:    18  Height:      Weight:      SpO2: 98% 99%  99%    Medications  sodium chloride 0.9 % bolus 500 mL (500 mLs Intravenous New Bag/Given 06/13/14 2246)  hydrALAZINE (APRESOLINE) injection 5 mg (5 mg Intravenous Given 06/13/14 2303)    Jazzmon Dowers is a 80 y.o. female presenting with lightheaded sensation when going from sitting to standing. Denies vertiginous sensation. Neuro exam is nonfocal. Head CT without acute abnormality. EKG is nonischemic with no arrhythmia. Patient is asymptomatic at orthostatic vital signs. Ambulatory independently and without issue.  Amenable to discharge.  This is a shared visit with the attending physician who personally evaluated the patient and agrees with the care plan.   Evaluation does not show pathology that would require ongoing emergent intervention or inpatient treatment. Pt is hemodynamically stable and mentating appropriately. Discussed findings and plan with patient/guardian, who agrees with care plan. All questions  answered. Return precautions discussed and outpatient follow up given.       Monico Blitz, PA-C 06/14/14 0022

## 2014-06-13 NOTE — ED Notes (Signed)
Patient transported to CT 

## 2014-06-13 NOTE — ED Notes (Signed)
Pt. reports elevated blood pressure ( 224/105) at home this evening with lightheadedness , denies nausea or vomitting / no blurred vision .

## 2014-06-14 ENCOUNTER — Ambulatory Visit (INDEPENDENT_AMBULATORY_CARE_PROVIDER_SITE_OTHER): Payer: Medicare Other | Admitting: Internal Medicine

## 2014-06-14 ENCOUNTER — Encounter: Payer: Self-pay | Admitting: Internal Medicine

## 2014-06-14 VITALS — BP 150/90 | HR 84 | Temp 98.5°F | Resp 14 | Wt 142.1 lb

## 2014-06-14 DIAGNOSIS — I10 Essential (primary) hypertension: Secondary | ICD-10-CM

## 2014-06-14 MED ORDER — FLUTICASONE PROPIONATE 50 MCG/ACT NA SUSP: 2.0000 | Freq: Every day | NASAL | Status: DC

## 2014-06-14 MED ORDER — HYDRALAZINE HCL 50 MG PO TABS: 50.0000 mg | ORAL_TABLET | Freq: Three times a day (TID) | ORAL | Status: DC

## 2014-06-14 MED ORDER — PANTOPRAZOLE SODIUM 20 MG PO TBEC: 20.0000 mg | DELAYED_RELEASE_TABLET | Freq: Every day | ORAL | Status: DC

## 2014-06-14 MED ORDER — GABAPENTIN 100 MG PO CAPS: ORAL_CAPSULE | ORAL | Status: DC

## 2014-06-14 MED ORDER — CARVEDILOL 3.125 MG PO TABS: 3.1250 mg | ORAL_TABLET | Freq: Two times a day (BID) | ORAL | Status: DC

## 2014-06-14 NOTE — Patient Instructions (Signed)
The  Cardiology appointment with Dr Daneen Schick will be scheduled and you'll be notified of the time.Please call if you have not been notified of appointment time within 7-10 days.

## 2014-06-14 NOTE — Progress Notes (Signed)
   Subjective:    Patient ID: Stacy Page, female    DOB: 05/13/1933, 80 y.o.   MRN: [id number]  HPI   She was seen in the ER 06/13/14; those records were reviewed  She presented with lightheadedness with postural change, going from sitting to standing position.  There was no cardiac or neurologic prodrome prior to the onset of her symptoms  Her blood pressure was found to be as high as 195/105.  She was treated with parenteral medications  CT scan of head revealed no acute process  She also had symptoms of rhinitis for which she was taking over-the-counter antihistamines. She is not taking decongestants.    Review of Systems  Denied were any change in heart rhythm or rate prior to the event. There was no associated chest pain or shortness of breath .  Also specifically denied prior to the episode were headache, limb weakness, tingling, or numbness. No seizure activity noted.      Objective:   Physical Exam Appears healthy and well-nourished & in no acute distress. She appears dramatically younger than age. Immaculately dressed .  No carotid bruits are present.No neck vein distention present at 10 - 15 degrees. Thyroid normal to palpation  Heart rhythm and rate are normal with no gallop or murmur  Chest is clear with no increased work of breathing  There is no evidence of aortic aneurysm or renal artery bruits  Abdomen soft with no organomegaly or masses. No HJR  No clubbing, cyanosis  present. Nonpitting extremity edema  Pedal pulses are intact but decreased  No ischemic skin changes are present . Fingernails/ toenails healthy   Alert and oriented. Strength, tone, DTRs reflexes normal          Assessment & Plan:  #1 accelerated hypertension #2 PMH of bradycardia with Diltiazem & Bystolic  Plan: The lisinopril initiated @ 10 mg by ER MD will be discontinued as she is on Benicar 40 mg.  Low-dose carvedilol initiated.  Hydralazine will be changed to 50  mg 3 times a day pending titration of Carvedilol. Pulse response will be monitored.  Followup with Dr. Tamala Julian we proceeded quickly as possible

## 2014-06-14 NOTE — ED Notes (Signed)
While ambulating pt, pt did not have any trouble walking. Pt did state that she was feeling slightly light headed.

## 2014-06-14 NOTE — Progress Notes (Signed)
Pre visit review using our clinic review tool, if applicable. No additional management support is needed unless otherwise documented below in the visit note. 

## 2014-06-14 NOTE — Telephone Encounter (Signed)
OK X1 

## 2014-06-14 NOTE — Discharge Instructions (Signed)
Please follow with your primary care doctor in the next 5 days for high blood pressure evaluation. If you do not have a primary care doctor, present to urgent care. Reduce salt intake. Seek emergency medical care for unilateral weakness, slurring, change in vision, or chest pain and shortness of breath.  Please follow with your primary care doctor in the next 2 days for a check-up. They must obtain records for further management.   Do not hesitate to return to the Emergency Department for any new, worsening or concerning symptoms.    Hypertension Hypertension, commonly called high blood pressure, is when the force of blood pumping through your arteries is too strong. Your arteries are the blood vessels that carry blood from your heart throughout your body. A blood pressure reading consists of a higher number over a lower number, such as 110/72. The higher number (systolic) is the pressure inside your arteries when your heart pumps. The lower number (diastolic) is the pressure inside your arteries when your heart relaxes. Ideally you want your blood pressure below 120/80. Hypertension forces your heart to work harder to pump blood. Your arteries may become narrow or stiff. Having hypertension puts you at risk for heart disease, stroke, and other problems.  RISK FACTORS Some risk factors for high blood pressure are controllable. Others are not.  Risk factors you cannot control include:   Race. You may be at higher risk if you are African American.  Age. Risk increases with age.  Gender. Men are at higher risk than women before age 63 years. After age 51, women are at higher risk than men. Risk factors you can control include:  Not getting enough exercise or physical activity.  Being overweight.  Getting too much fat, sugar, calories, or salt in your diet.  Drinking too much alcohol. SIGNS AND SYMPTOMS Hypertension does not usually cause signs or symptoms. Extremely high blood pressure  (hypertensive crisis) may cause headache, anxiety, shortness of breath, and nosebleed. DIAGNOSIS  To check if you have hypertension, your health care provider will measure your blood pressure while you are seated, with your arm held at the level of your heart. It should be measured at least twice using the same arm. Certain conditions can cause a difference in blood pressure between your right and left arms. A blood pressure reading that is higher than normal on one occasion does not mean that you need treatment. If one blood pressure reading is high, ask your health care provider about having it checked again. TREATMENT  Treating high blood pressure includes making lifestyle changes and possibly taking medicine. Living a healthy lifestyle can help lower high blood pressure. You may need to change some of your habits. Lifestyle changes may include:  Following the DASH diet. This diet is high in fruits, vegetables, and whole grains. It is low in salt, red meat, and added sugars.  Getting at least 2 hours of brisk physical activity every week.  Losing weight if necessary.  Not smoking.  Limiting alcoholic beverages.  Learning ways to reduce stress. If lifestyle changes are not enough to get your blood pressure under control, your health care provider may prescribe medicine. You may need to take more than one. Work closely with your health care provider to understand the risks and benefits. HOME CARE INSTRUCTIONS  Have your blood pressure rechecked as directed by your health care provider.   Take medicines only as directed by your health care provider. Follow the directions carefully. Blood pressure medicines must  be taken as prescribed. The medicine does not work as well when you skip doses. Skipping doses also puts you at risk for problems.   Do not smoke.   Monitor your blood pressure at home as directed by your health care provider. SEEK MEDICAL CARE IF:   You think you are  having a reaction to medicines taken.  You have recurrent headaches or feel dizzy.  You have swelling in your ankles.  You have trouble with your vision. SEEK IMMEDIATE MEDICAL CARE IF:  You develop a severe headache or confusion.  You have unusual weakness, numbness, or feel faint.  You have severe chest or abdominal pain.  You vomit repeatedly.  You have trouble breathing. MAKE SURE YOU:   Understand these instructions.  Will watch your condition.  Will get help right away if you are not doing well or get worse. Document Released: 08/09/2005 Document Revised: 12/24/2013 Document Reviewed: 06/01/2013 [facility] Patient Information 2015 [place], [place]. This information is not intended to replace advice given to you by your health care provider. Make sure you discuss any questions you have with your health care provider.

## 2014-06-15 NOTE — ED Provider Notes (Signed)
Medical screening examination/treatment/procedure(s) were conducted as a shared visit with non-physician practitioner(s) and myself.  I personally evaluated the patient during the encounter.   EKG Interpretation   Date/Time:  Thursday June 13 2014 21:39:39 EDT Ventricular Rate:  65 PR Interval:  134 QRS Duration: 86 QT Interval:  400 QTC Calculation: 416 R Axis:   11 Text Interpretation:  Age not entered, assumed to be  80 years old for  purpose of ECG interpretation Sinus rhythm No significant change since  last tracing Confirmed by Kamree Wiens  MD, Arrow Emmerich ([id number]) on 06/13/2014  11:48:52 PM       Patient with dizziness similar to prior. Resolved on own in ED. BP trending down. Do not feel this is hypertensive emergency. Doubt cardiac cause of her dizziness. She will f/u with PCP ASAP, stable for discharge.  Ephraim Hamburger, MD 06/15/14 [phone]

## 2014-06-29 ENCOUNTER — Other Ambulatory Visit: Payer: Self-pay | Admitting: Internal Medicine

## 2014-07-01 NOTE — Telephone Encounter (Signed)
OK X1 Do not take outinely; prn only

## 2014-07-11 ENCOUNTER — Ambulatory Visit (INDEPENDENT_AMBULATORY_CARE_PROVIDER_SITE_OTHER): Payer: Medicare Other | Admitting: Internal Medicine

## 2014-07-11 ENCOUNTER — Encounter: Payer: Self-pay | Admitting: Internal Medicine

## 2014-07-11 VITALS — BP 152/94 | HR 76 | Temp 98.5°F | Resp 12 | Wt 140.2 lb

## 2014-07-11 DIAGNOSIS — J3089 Other allergic rhinitis: Secondary | ICD-10-CM

## 2014-07-11 DIAGNOSIS — I10 Essential (primary) hypertension: Secondary | ICD-10-CM

## 2014-07-11 DIAGNOSIS — R002 Palpitations: Secondary | ICD-10-CM

## 2014-07-11 DIAGNOSIS — R42 Dizziness and giddiness: Secondary | ICD-10-CM

## 2014-07-11 NOTE — Patient Instructions (Signed)

## 2014-07-11 NOTE — Progress Notes (Signed)
Subjective:    Patient ID: Stacy Page, female    DOB: [birth year], 80 y.o.   MRN: [id number]  HPI   She's had a recurrence of lightheadedness within the last week. She actually was seen in the ED 10/22 and here 10/23 with the same symptoms.  It is now constant. Occasionally the symptoms  associated with change in position in bed and occasionally with palpitations but this is not a fixed relationship.  She no longer finds meclizine of any benefit  She also has intermittent nagging headache which is localized to the posterior crown area. It is severe as level IV.  She has tinnitus but this has been a chronic problem for several decades.  She has had some upper respiratory symptoms. This includes facial pressure greater on the left than the right. She has some postnasal drainage which varies from clear to yellow.  She has not had discolored secretions since last week.  She states that the symptoms are causing anxiety and depression. She is requesting lorazepam.  BP up to 225/107 (2 weeks ago); 185/105 last night   Review of Systems  Denied were any change in heart rhythm or rate prior to the lightheadedness. There was no associated chest pain or shortness of breath .  Also specifically denied prior to the symptoms were headache, limb weakness, tingling, or numbness. No seizure activity noted.  Frontal headache,  dental pain, sore throat , otic pain or otic discharge denied. No fever , chills or sweats.      Objective:   Physical Exam  Supine BP: 162/101 Sitting BP:158/98 Standing BP: 150/98 No change in pulse               Positive or pertinent findings include: She repeatedly clears her throat She has an upper dental plate. She has some peripheral edema without pitting  Gen.: Healthy and well-nourished in appearance. Alert, appropriate and cooperative throughout exam. Appears younger than stated age  Head: Normocephalic without obvious abnormalities  Eyes: No  corneal or conjunctival inflammation noted. Pupils equal round reactive to light and accommodation. Extraocular motion intact. No nystagmus.Field of Vision grossly normal Ears: External  ear exam reveals no significant lesions or deformities. Canals clear .TMs normal. Hearing is grossly normal bilaterally. Nose: External nasal exam reveals no deformity or inflammation. Nasal mucosa are pink and moist. No lesions or exudates noted.   Mouth: Oral mucosa and oropharynx reveal no lesions or exudates. Teeth in good repair. Neck: No deformities, masses, or tenderness noted. Range of motion & Thyroid normal. Lungs: Normal respiratory effort; chest expands symmetrically. Lungs are clear to auscultation without rales, wheezes, or increased work of breathing. Heart: Normal rate and rhythm. Normal S1 and S2. No gallop, click, or rub. No murmur. Abdomen: Bowel sounds normal; abdomen soft and nontender. No masses, organomegaly or hernias noted. Musculoskeletal/extremities: Accentuated curvature of upper thoracic spine. No clubbing, cyanosis or significant extremity  deformity noted. Range of motion normal .Tone & strength normal. Hand joints normal  Fingernail health good. Able to lie down & sit up w/o help. Negative SLR bilaterally Vascular: Carotid, radial artery, dorsalis pedis and  posterior tibial pulses are full and equal. No bruits present. Neurologic: Alert and oriented x3. Deep tendon reflexes symmetrical and normal.  Gait normal  including heel & toe walking . Rhomberg & finger to nose negative      Skin: Intact without suspicious lesions or rashes. Lymph: No cervical, axillary lymphadenopathy present. Psych: Mood and affect are normal. Normally interactive  Assessment & Plan:  #1 dizziness; the most likely major components are uncontrolled blood pressure. Cardiology referral pending  #2 allergic rhinitis  some recommendations as per [place] allergy  #3 component of benign positional vertigo; PT referral if progressive  #4 palpitations; avoidance of stimulants recommended. If these persist or progress Event monitoring the completed.

## 2014-07-11 NOTE — Progress Notes (Signed)
Pre visit review using our clinic review tool, if applicable. No additional management support is needed unless otherwise documented below in the visit note. 

## 2014-07-12 ENCOUNTER — Telehealth: Payer: Self-pay | Admitting: Internal Medicine

## 2014-07-12 NOTE — Telephone Encounter (Signed)
emmi emailed °

## 2014-07-15 ENCOUNTER — Other Ambulatory Visit: Payer: Self-pay | Admitting: Internal Medicine

## 2014-07-17 ENCOUNTER — Other Ambulatory Visit: Payer: Self-pay

## 2014-07-17 MED ORDER — LORAZEPAM 0.5 MG PO TABS: 0.5000 mg | ORAL_TABLET | Freq: Three times a day (TID) | ORAL | Status: DC | PRN

## 2014-07-17 NOTE — Telephone Encounter (Signed)
OK X1  Prn only, not on routine schedule

## 2014-07-17 NOTE — Telephone Encounter (Signed)
Lorazepam has been called to [facility] 

## 2014-07-23 ENCOUNTER — Ambulatory Visit: Payer: Medicare Other | Admitting: Interventional Cardiology

## 2014-08-13 ENCOUNTER — Other Ambulatory Visit: Payer: Self-pay | Admitting: Internal Medicine

## 2014-08-14 NOTE — Telephone Encounter (Signed)
OK #15 This medication is among those which experts have documented to have a very  high risk of affecting  mental  alertness  & balance. This results in increased risk of falling with serious health or life threatening injury. Such medication should be taken as infrequently as possible and @  the lowest possible dose.

## 2014-08-20 NOTE — Telephone Encounter (Signed)
na

## 2014-08-26 ENCOUNTER — Ambulatory Visit (INDEPENDENT_AMBULATORY_CARE_PROVIDER_SITE_OTHER): Payer: Medicare Other | Admitting: Interventional Cardiology

## 2014-08-26 ENCOUNTER — Encounter: Payer: Self-pay | Admitting: Interventional Cardiology

## 2014-08-26 VITALS — BP 180/100 | HR 66 | Ht <= 58 in | Wt 139.0 lb

## 2014-08-26 DIAGNOSIS — I10 Essential (primary) hypertension: Secondary | ICD-10-CM | POA: Diagnosis not present

## 2014-08-26 MED ORDER — AMLODIPINE BESYLATE 5 MG PO TABS: 5.0000 mg | ORAL_TABLET | Freq: Every day | ORAL | Status: DC

## 2014-08-26 MED ORDER — FUROSEMIDE 40 MG PO TABS: 40.0000 mg | ORAL_TABLET | Freq: Every day | ORAL | Status: DC

## 2014-08-26 NOTE — Progress Notes (Signed)
Patient ID: Stacy Page, female   DOB: 04/01/1933, 81 y.o.   MRN: [id number]   Date: 08/26/2014 ID: Stacy Page, DOB 1932-10-19, MRN [id number] PCP: Unice Cobble, MD  Reason: Elevated blood pressure  ASSESSMENT;  1. Essential hypertension with poor control 2. Hyperlipidemia 3. History of asthma 4. Gastroesophageal reflux  PLAN:  1. Increase furosemide to 40 mg daily 2. Add amlodipine 5 mg per day 3. 2 week clinical follow-up 4. Basic metabolic panel in 2 weeks on return 5. Consider CT scan with contrast to rule out renal artery stenosis 6. Aerobic exercise to include walking but not resistance activities until her pressures better control 7. Extensive digital chart review  SUBJECTIVE: Stacy Page is a 81 y.o. female who is here for evaluation of difficulty control blood pressure. She is followed by Dr. Unice Cobble. Patient has recurring episodes of dizziness. These symptoms are not vertigo in nature. She has had several emergency room and wanted to Hospital admissions or poorly controlled systolic hypertension. She denies chest discomfort, dyspnea, excessive lower extremity swelling, and back discomfort. She takes several nonprescription nutritional supplements. She has not been compliant with furosemide. She should be on 40 mg daily but is taking only 20 mg. She drinks a lot of fluid every day. She states that she is compliant with a low-salt diet.   Allergies  Allergen Reactions  . Bystolic [Nebivolol Hcl]     Held by Hospitalist @ 05/22/14 admission due to bradycardia (P 44)  . Aspirin Other (See Comments)    gastritis  . Cardizem [Diltiazem Hcl] Other (See Comments)    bradycardia  . Amlodipine Besylate Swelling  . Metoclopramide Hcl Other (See Comments)    UNKNOWN  . Spironolactone Other (See Comments)    unknown    Current Outpatient Prescriptions on File Prior to Visit  Medication Sig Dispense Refill  . albuterol (PROVENTIL HFA;VENTOLIN HFA) 108 (90 BASE)  MCG/ACT inhaler Inhale 2 puffs into the lungs every 6 (six) hours as needed for wheezing or shortness of breath (asthma).    . Ascorbic Acid (VITAMIN C) 500 MG tablet Take 500 mg by mouth daily.      Marland Kitchen atorvastatin (LIPITOR) 40 MG tablet 1/2 pill M,W, F, & Sun 30 tablet 2  . Calcium Carbonate (CALTRATE 600 PO) Take 1,200 mg by mouth daily.     . Cholecalciferol (VITAMIN D3) 2000 UNITS capsule Take 2,000 Units by mouth daily.    . clopidogrel (PLAVIX) 75 MG tablet Take 1 tablet (75 mg total) by mouth daily. 30 tablet 6  . co-enzyme Q-10 30 MG capsule Take 30 mg by mouth daily.     . cycloSPORINE (RESTASIS) 0.05 % ophthalmic emulsion Place 1 drop into both eyes 2 (two) times daily.    Marland Kitchen FEVERFEW PO Take 2 capsules by mouth daily. For vision    . Fish Oil-Cholecalciferol (FISH OIL + D3 PO) Take 2 capsules by mouth daily.    . fluticasone (FLONASE) 50 MCG/ACT nasal spray Place 2 sprays into both nostrils daily. 16 g 0  . furosemide (LASIX) 40 MG tablet Take 20 mg by mouth daily.     Marland Kitchen gabapentin (NEURONTIN) 100 MG capsule one every 8 hours as needed. 90 capsule 2  . Horse Chestnut 300 MG TABS Take 600 mg by mouth daily.     . hydrALAZINE (APRESOLINE) 50 MG tablet Take 1 tablet (50 mg total) by mouth 3 (three) times daily. 90 tablet 1  . LORazepam (ATIVAN) 0.5 MG tablet Take 1  tablet (0.5 mg total) by mouth every 8 (eight) hours as needed for anxiety (anxiety). 30 tablet 0  . meclizine (ANTIVERT) 25 MG tablet TAKE 1/2 TO 1 TABLET BY MOUTH THREE TIMES DAILY AS NEEDED FOR DIZZINESS OR NAUSEA 15 tablet 0  . olmesartan (BENICAR) 40 MG tablet Take 40 mg by mouth daily.    . pantoprazole (PROTONIX) 20 MG tablet Take 1 tablet (20 mg total) by mouth daily. 90 tablet 1  . potassium chloride (K-DUR,KLOR-CON) 10 MEQ tablet Take 10 mEq by mouth 2 (two) times daily.    Marland Kitchen PROAIR HFA 108 (90 BASE) MCG/ACT inhaler INHALE 1 TO 2 PUFFS BY MOUTH EVERY 4 HOURS AS NEEDED 8.5 g 0   No current facility-administered  medications on file prior to visit.    Past Medical History  Diagnosis Date  . Hypertension   . Hyperlipidemia   . Allergy     seasonal  . Asthma   . GERD (gastroesophageal reflux disease)     Past Surgical History  Procedure Laterality Date  . Bunionectomy    . Carpal tunnel release      Bilaterally  . Abdominal hysterectomy      for fibroids (no BSO)  . Macular degeneration      Dr Zadie Rhine  ; injections  . Colonoscopy  2011    tiny polyp ;  GI    History   Social History  . Marital Status: Divorced    Spouse Name: N/A    Number of Children: N/A  . Years of Education: N/A   Occupational History  . Not on file.   Social History Main Topics  . Smoking status: Former Smoker    Types: Cigarettes    Quit date: 08/24/1979  . Smokeless tobacco: Never Used     Comment: smoked  1966-1981, up to 1 ppd  . Alcohol Use: No  . Drug Use: No  . Sexual Activity: Not on file   Other Topics Concern  . Not on file   Social History Narrative    Family History  Problem Relation Age of Onset  . Alzheimer's disease Mother   . Hypertension Mother   . Stroke Mother 47  . Hypertension Father   . Heart attack Father     in 62's  . Hypertension Maternal Grandmother   . Heart attack Maternal Grandmother 73  . Diabetes      1/2 sister    ROS: Denies orthopnea, PND, lower extreme swelling, palpitations, syncope, lower extremity ulcers, interscapular pain, headache, and prolonged palpitations.. Other systems negative for complaints.  OBJECTIVE: BP 180/100 mmHg  Pulse 66  Ht 4\' 10"  (1.473 m)  Wt 139 lb (63.05 kg)  BMI 29.06 kg/m2,  General: No acute distress, petite, appearing younger than stated age 11: normal without jaundice or pallor Neck: JVD elevated right external. Carotids absent Chest: Clear Cardiac: Murmur: None. Gallop: S4. Rhythm: Normal. Other: Normal Abdomen: Bruit: Absent. Pulsation: Positive in the periumbilical area. Extremities: Edema:  Absent. Pulses: 2+ and symmetric Neuro: Normal Psych: Normal  ECG: Poor R-wave progression, normal sinus rhythm

## 2014-08-26 NOTE — Patient Instructions (Signed)
Your physician has recommended you make the following change in your medication:   START TAKING AMLODIPINE 5MG  DAILY  INCREASE LASIX TO 40MG  DAILY  Your physician recommends that you return for lab work on: 09/12/14 (BMET)  Your physician recommends that you schedule a follow-up appointment in: 2 weeks on 09/12/14 at 3pm with Dr. Tamala Julian

## 2014-09-10 ENCOUNTER — Encounter: Payer: Self-pay | Admitting: Internal Medicine

## 2014-09-10 ENCOUNTER — Ambulatory Visit (INDEPENDENT_AMBULATORY_CARE_PROVIDER_SITE_OTHER): Payer: Medicare Other | Admitting: Internal Medicine

## 2014-09-10 VITALS — BP 150/90 | HR 83 | Temp 98.3°F | Resp 18 | Ht <= 58 in | Wt 139.8 lb

## 2014-09-10 DIAGNOSIS — R42 Dizziness and giddiness: Secondary | ICD-10-CM

## 2014-09-10 DIAGNOSIS — I10 Essential (primary) hypertension: Secondary | ICD-10-CM

## 2014-09-10 NOTE — Progress Notes (Signed)
Pre visit review using our clinic review tool, if applicable. No additional management support is needed unless otherwise documented below in the visit note. 

## 2014-09-10 NOTE — Assessment & Plan Note (Signed)
BP much better controlled on lasix and amlodipine and hydralazine. She is currently out of benicar and reminded her to fill this medication. She will get BMP in 2 days when she follows up with cardiology.

## 2014-09-10 NOTE — Progress Notes (Signed)
   Subjective:    Patient ID: Stacy Page, female    DOB: [birth year], 81 y.o.   MRN: [id number]  HPI The patient is a 81 YO female who is coming in today to establish care. She does have PMH of accelerated HTN, TIA, spinal stenosis, allergies, asthma. She was recently seen by cardiology to help with control of her blood pressure and they added amlodipine and increased her lasix to 40 mg daily. She is currently out of her benicar because she needs to pick it up. She denies headache, chest pains, abdominal pain, nausea, vomiting.   Review of Systems  Constitutional: Negative for fever, activity change, appetite change, fatigue and unexpected weight change.  HENT: Negative.   Respiratory: Negative for cough, chest tightness, shortness of breath and wheezing.   Cardiovascular: Negative for chest pain, palpitations and leg swelling.  Gastrointestinal: Negative for nausea, abdominal pain, diarrhea, constipation and abdominal distention.  Musculoskeletal: Negative.   Skin: Negative.   Neurological: Negative for dizziness, speech difficulty, weakness, light-headedness, numbness and headaches.  Psychiatric/Behavioral: Negative.       Objective:   Physical Exam  Constitutional: She is oriented to person, place, and time. She appears well-developed and well-nourished.  HENT:  Head: Normocephalic and atraumatic.  Eyes: EOM are normal.  Neck: Normal range of motion.  Cardiovascular: Normal rate and regular rhythm.   Pulmonary/Chest: Effort normal and breath sounds normal. No respiratory distress. She has no wheezes. She has no rales.  Abdominal: Soft. Bowel sounds are normal. She exhibits no distension. There is no tenderness. There is no rebound.  Musculoskeletal: She exhibits no edema.  Neurological: She is alert and oriented to person, place, and time. Coordination normal.  Skin: Skin is warm and dry.   Filed Vitals:   09/10/14 1314  BP: 162/94  Pulse: 83  Temp: 98.3 F (36.8 C)    TempSrc: Oral  Resp: 18  Height: 4\' 10"  (1.473 m)  Weight: 139 lb 12.8 oz (63.413 kg)  SpO2: 98%      Assessment & Plan:

## 2014-09-10 NOTE — Patient Instructions (Signed)
We would like you to pick up your benicar and keep taking the amlodipine and the lasix (furosemide) as you have been.   Your blood pressure is much better today and please keep your appointment with the heart doctor, Dr. Tamala Julian as he will check your blood work then.

## 2014-09-10 NOTE — Assessment & Plan Note (Signed)
She is taking meclizine sparingly, showed her epley maneuver in the clinic today and advised she start using 2 times a day for 2-3 weeks to see if she has improvement in her symptoms and needing less meclizine.

## 2014-09-12 ENCOUNTER — Encounter: Payer: Self-pay | Admitting: Interventional Cardiology

## 2014-09-12 ENCOUNTER — Ambulatory Visit (INDEPENDENT_AMBULATORY_CARE_PROVIDER_SITE_OTHER): Payer: Medicare Other | Admitting: Interventional Cardiology

## 2014-09-12 ENCOUNTER — Other Ambulatory Visit (INDEPENDENT_AMBULATORY_CARE_PROVIDER_SITE_OTHER): Payer: Medicare Other | Admitting: *Deleted

## 2014-09-12 VITALS — BP 140/78 | HR 68 | Ht <= 58 in | Wt 139.8 lb

## 2014-09-12 DIAGNOSIS — E782 Mixed hyperlipidemia: Secondary | ICD-10-CM

## 2014-09-12 DIAGNOSIS — G459 Transient cerebral ischemic attack, unspecified: Secondary | ICD-10-CM | POA: Diagnosis not present

## 2014-09-12 DIAGNOSIS — I10 Essential (primary) hypertension: Secondary | ICD-10-CM | POA: Diagnosis not present

## 2014-09-12 LAB — HEPATIC FUNCTION PANEL
ALT: 19 U/L (ref 0–35)
AST: 20 U/L (ref 0–37)
Albumin: 4 g/dL (ref 3.5–5.2)
Alkaline Phosphatase: 134 U/L — ABNORMAL HIGH (ref 39–117)
BILIRUBIN DIRECT: 0.1 mg/dL (ref 0.0–0.3)
Total Bilirubin: 0.4 mg/dL (ref 0.2–1.2)
Total Protein: 7 g/dL (ref 6.0–8.3)

## 2014-09-12 LAB — BASIC METABOLIC PANEL
BUN: 16 mg/dL (ref 6–23)
CALCIUM: 9.1 mg/dL (ref 8.4–10.5)
CO2: 25 mEq/L (ref 19–32)
Chloride: 105 mEq/L (ref 96–112)
Creatinine, Ser: 0.93 mg/dL (ref 0.40–1.20)
GFR: 74.69 mL/min (ref >60.00)
GLUCOSE: 92 mg/dL (ref 70–99)
Potassium: 3.4 mEq/L — ABNORMAL LOW (ref 3.5–5.1)
Sodium: 139 mEq/L (ref 135–145)

## 2014-09-12 LAB — CK: CK TOTAL: 92 U/L (ref 7–177)

## 2014-09-12 LAB — LIPID PANEL
CHOL/HDL RATIO: 4
Cholesterol: 172 mg/dL (ref 0–200)
HDL: 47.4 mg/dL (ref >39.00)
LDL CALC: 105 mg/dL — AB (ref 0–99)
NONHDL: 124.6
TRIGLYCERIDES: 98 mg/dL (ref 0.0–149.0)
VLDL: 19.6 mg/dL (ref 0.0–40.0)

## 2014-09-12 NOTE — Progress Notes (Signed)
Patient ID: Stacy Page, female   DOB: Mar 27, 1933, 81 y.o.   MRN: [id number]    [address], [address], [place]  [address] Phone: [phone] Fax:  [phone]  Date:  09/12/2014   ID:  Stacy Page, DOB [birth year], MRN [id number]  PCP:  Olga Millers, MD   ASSESSMENT:  1. Essential hypertension, improved  PLAN:  1. Low salt diet 2. Continue medication as listed Clinic follow-up in 3 months   SUBJECTIVE: Stacy Page is a 81 y.o. female who is doing okay. She has no symptoms after medication adjustment.   Wt Readings from Last 3 Encounters:  09/12/14 139 lb 12.8 oz (63.413 kg)  09/10/14 139 lb 12.8 oz (63.413 kg)  08/26/14 139 lb (63.05 kg)     Past Medical History  Diagnosis Date  . Hypertension   . Hyperlipidemia   . Allergy     seasonal  . Asthma   . GERD (gastroesophageal reflux disease)     Current Outpatient Prescriptions  Medication Sig Dispense Refill  . amLODipine (NORVASC) 5 MG tablet Take 1 tablet (5 mg total) by mouth daily. 30 tablet 5  . Ascorbic Acid (VITAMIN C) 500 MG tablet Take 500 mg by mouth daily.      Marland Kitchen atorvastatin (LIPITOR) 40 MG tablet 1/2 pill M,W, F, & Sun 30 tablet 2  . Calcium Carbonate (CALTRATE 600 PO) Take 1,200 mg by mouth daily.     . Cholecalciferol (VITAMIN D3) 2000 UNITS capsule Take 2,000 Units by mouth daily.    . clopidogrel (PLAVIX) 75 MG tablet Take 1 tablet (75 mg total) by mouth daily. 30 tablet 6  . co-enzyme Q-10 30 MG capsule Take 30 mg by mouth daily.     . [facility] GARLIC ODORLESS PO Take 1 tablet by mouth daily.    . cycloSPORINE (RESTASIS) 0.05 % ophthalmic emulsion Place 1 drop into both eyes 2 (two) times daily.    Marland Kitchen FEVERFEW PO Take 2 capsules by mouth daily. For vision    . Fish Oil-Cholecalciferol (FISH OIL + D3 PO) Take 2 capsules by mouth daily.    . fluticasone (FLONASE) 50 MCG/ACT nasal spray Place 2 sprays into both nostrils daily. 16 g 0  . furosemide (LASIX) 40 MG tablet Take 1  tablet (40 mg total) by mouth daily. 30 tablet 5  . gabapentin (NEURONTIN) 100 MG capsule one every 8 hours as needed. 90 capsule 2  . Horse Chestnut 300 MG TABS Take 600 mg by mouth daily.     . hydrALAZINE (APRESOLINE) 50 MG tablet Take 1 tablet (50 mg total) by mouth 3 (three) times daily. 90 tablet 1  . LORazepam (ATIVAN) 0.5 MG tablet Take 1 tablet (0.5 mg total) by mouth every 8 (eight) hours as needed for anxiety (anxiety). 30 tablet 0  . meclizine (ANTIVERT) 25 MG tablet TAKE 1/2 TO 1 TABLET BY MOUTH THREE TIMES DAILY AS NEEDED FOR DIZZINESS OR NAUSEA 15 tablet 0  . olmesartan (BENICAR) 40 MG tablet Take 40 mg by mouth daily.    . pantoprazole (PROTONIX) 20 MG tablet Take 1 tablet (20 mg total) by mouth daily. 90 tablet 1  . potassium chloride (K-DUR,KLOR-CON) 10 MEQ tablet Take 10 mEq by mouth 2 (two) times daily.    Marland Kitchen PROAIR HFA 108 (90 BASE) MCG/ACT inhaler INHALE 1 TO 2 PUFFS BY MOUTH EVERY 4 HOURS AS NEEDED 8.5 g 0   No current facility-administered medications for this visit.  Allergies:    Allergies  Allergen Reactions  . Bystolic [Nebivolol Hcl]     Held by Hospitalist @ 05/22/14 admission due to bradycardia (P 44)  . Aspirin Other (See Comments)    gastritis  . Cardizem [Diltiazem Hcl] Other (See Comments)    bradycardia  . Amlodipine Besylate Swelling  . Metoclopramide Hcl Other (See Comments)    UNKNOWN  . Spironolactone Other (See Comments)    unknown    Social History:  The patient  reports that she quit smoking about 35 years ago. Her smoking use included Cigarettes. She has never used smokeless tobacco. She reports that she does not drink alcohol or use illicit drugs.   ROS:  Please see the history of present illness.   Denies edema or orthopnea   All other systems reviewed and negative.   OBJECTIVE: VS:  BP 140/78 mmHg  Pulse 68  Ht 4\' 10"  (1.473 m)  Wt 139 lb 12.8 oz (63.413 kg)  BMI 29.23 kg/m2 Well nourished, well developed, in no acute distress,  younger than stated age 38: normal Neck: JVD flat. Carotid bruit absent  Cardiac:  normal S1, S2; RRR; no murmur Lungs:  clear to auscultation bilaterally, no wheezing, rhonchi or rales Abd: soft, nontender, no hepatomegaly Ext: Edema absent. Pulses 2+ Skin: warm and dry Neuro:  CNs 2-12 intact, no focal abnormalities noted  EKG:  Normal sinus rhythm with left axis deviation.    Signed, Illene Labrador III, MD 09/12/2014 3:55 PM

## 2014-09-12 NOTE — Patient Instructions (Signed)
Your physician recommends that you continue on your current medications as directed. Please refer to the Current Medication list given to you today.  Your physician recommends that you schedule a follow-up appointment in: 3 months with Dr. Tamala Julian     Low-Sodium Eating Plan Sodium raises blood pressure and causes water to be held in the body. Getting less sodium from food will help lower your blood pressure, reduce any swelling, and protect your heart, liver, and kidneys. We get sodium by adding salt (sodium chloride) to food. Most of our sodium comes from canned, boxed, and frozen foods. Restaurant foods, fast foods, and pizza are also very high in sodium. Even if you take medicine to lower your blood pressure or to reduce fluid in your body, getting less sodium from your food is important. WHAT IS MY PLAN? Most people should limit their sodium intake to 2,300 mg a day. Your health care provider recommends that you limit your sodium intake to __________ a day.  WHAT DO I NEED TO KNOW ABOUT THIS EATING PLAN? For the low-sodium eating plan, you will follow these general guidelines:  Choose foods with a % Daily Value for sodium of less than 5% (as listed on the food label).   Use salt-free seasonings or herbs instead of table salt or sea salt.   Check with your health care provider or pharmacist before using salt substitutes.   Eat fresh foods.  Eat more vegetables and fruits.  Limit canned vegetables. If you do use them, rinse them well to decrease the sodium.   Limit cheese to 1 oz (28 g) per day.   Eat lower-sodium products, often labeled as "lower sodium" or "no salt added."  Avoid foods that contain monosodium glutamate (MSG). MSG is sometimes added to [place] food and some canned foods.  Check food labels (Nutrition Facts labels) on foods to learn how much sodium is in one serving.  Eat more home-cooked food and less restaurant, buffet, and fast food.  When eating at a  restaurant, ask that your food be prepared with less salt or none, if possible.  HOW DO I READ FOOD LABELS FOR SODIUM INFORMATION? The Nutrition Facts label lists the amount of sodium in one serving of the food. If you eat more than one serving, you must multiply the listed amount of sodium by the number of servings. Food labels may also identify foods as:  Sodium free--Less than 5 mg in a serving.  Very low sodium--35 mg or less in a serving.  Low sodium--140 mg or less in a serving.  Light in sodium--50% less sodium in a serving. For example, if a food that usually has 300 mg of sodium is changed to become light in sodium, it will have 150 mg of sodium.  Reduced sodium--25% less sodium in a serving. For example, if a food that usually has 400 mg of sodium is changed to reduced sodium, it will have 300 mg of sodium. WHAT FOODS CAN I EAT? Grains Low-sodium cereals, including oats, puffed wheat and rice, and shredded wheat cereals. Low-sodium crackers. Unsalted rice and pasta. Lower-sodium bread.  Vegetables Frozen or fresh vegetables. Low-sodium or reduced-sodium canned vegetables. Low-sodium or reduced-sodium tomato sauce and paste. Low-sodium or reduced-sodium tomato and vegetable juices.  Fruits Fresh, frozen, and canned fruit. Fruit juice.  Meat and Other Protein Products Low-sodium canned tuna and salmon. Fresh or frozen meat, poultry, seafood, and fish. Lamb. Unsalted nuts. Dried beans, peas, and lentils without added salt. Unsalted canned beans.  Homemade soups without salt. Eggs.  Dairy Milk. Soy milk. Ricotta cheese. Low-sodium or reduced-sodium cheeses. Yogurt.  Condiments Fresh and dried herbs and spices. Salt-free seasonings. Onion and garlic powders. Low-sodium varieties of mustard and ketchup. Lemon juice.  Fats and Oils Reduced-sodium salad dressings. Unsalted butter.  Other Unsalted popcorn and pretzels.  The items listed above may not be a complete list of  recommended foods or beverages. Contact your dietitian for more options. WHAT FOODS ARE NOT RECOMMENDED? Grains Instant hot cereals. Bread stuffing, pancake, and biscuit mixes. Croutons. Seasoned rice or pasta mixes. Noodle soup cups. Boxed or frozen macaroni and cheese. Self-rising flour. Regular salted crackers. Vegetables Regular canned vegetables. Regular canned tomato sauce and paste. Regular tomato and vegetable juices. Frozen vegetables in sauces. Salted french fries. Olives. Angie Fava. Relishes. Sauerkraut. Salsa. Meat and Other Protein Products Salted, canned, smoked, spiced, or pickled meats, seafood, or fish. Bacon, ham, sausage, hot dogs, corned beef, chipped beef, and packaged luncheon meats. Salt pork. Jerky. Pickled herring. Anchovies, regular canned tuna, and sardines. Salted nuts. Dairy Processed cheese and cheese spreads. Cheese curds. Blue cheese and cottage cheese. Buttermilk.  Condiments Onion and garlic salt, seasoned salt, table salt, and sea salt. Canned and packaged gravies. Worcestershire sauce. Tartar sauce. Barbecue sauce. Teriyaki sauce. Soy sauce, including reduced sodium. Steak sauce. Fish sauce. Oyster sauce. Cocktail sauce. Horseradish. Regular ketchup and mustard. Meat flavorings and tenderizers. Bouillon cubes. Hot sauce. Tabasco sauce. Marinades. Taco seasonings. Relishes. Fats and Oils Regular salad dressings. Salted butter. Margarine. Ghee. Bacon fat.  Other Potato and tortilla chips. Corn chips and puffs. Salted popcorn and pretzels. Canned or dried soups. Pizza. Frozen entrees and pot pies.  The items listed above may not be a complete list of foods and beverages to avoid. Contact your dietitian for more information. Document Released: 01/29/2002 Document Revised: 08/14/2013 Document Reviewed: 06/13/2013 [facility] Patient Information 2015 [place], [place]. This information is not intended to replace advice given to you by your health care provider. Make  sure you discuss any questions you have with your health care provider.

## 2014-09-16 ENCOUNTER — Telehealth: Payer: Self-pay

## 2014-09-16 NOTE — Telephone Encounter (Signed)
-----   Message from Sinclair Grooms, MD sent at 09/13/2014  4:31 PM EST ----- Labs are good. Start potassium 20 meq daily

## 2014-09-17 NOTE — Telephone Encounter (Signed)
Pt aware of Dr.Smith recommendation.Increase potassium to 30 milliequivalents daily for 3 days. Drop the therapy back to baseline 10 twice a day thereafter.pt verbalized understanding.

## 2014-09-17 NOTE — Telephone Encounter (Signed)
-----   Message from [place], MD sent at 09/17/2014 10:43 AM EST ----- Increase potassium to 30 milliequivalents daily for 3 days. Drop the therapy back to baseline 10 twice a day thereafter

## 2014-09-26 DIAGNOSIS — J387 Other diseases of larynx: Secondary | ICD-10-CM | POA: Diagnosis not present

## 2014-09-26 DIAGNOSIS — R490 Dysphonia: Secondary | ICD-10-CM | POA: Diagnosis not present

## 2014-10-09 ENCOUNTER — Other Ambulatory Visit: Payer: Self-pay | Admitting: Internal Medicine

## 2014-10-28 ENCOUNTER — Encounter: Payer: Self-pay | Admitting: Internal Medicine

## 2014-11-06 ENCOUNTER — Other Ambulatory Visit: Payer: Self-pay | Admitting: Internal Medicine

## 2014-11-07 NOTE — Telephone Encounter (Signed)
Faxed script back to [facility].../lmb 

## 2014-11-12 ENCOUNTER — Other Ambulatory Visit: Payer: Self-pay | Admitting: Internal Medicine

## 2014-11-19 ENCOUNTER — Encounter: Payer: Self-pay | Admitting: Internal Medicine

## 2014-12-10 ENCOUNTER — Ambulatory Visit (INDEPENDENT_AMBULATORY_CARE_PROVIDER_SITE_OTHER): Payer: Medicare Other | Admitting: Interventional Cardiology

## 2014-12-10 ENCOUNTER — Encounter: Payer: Self-pay | Admitting: Interventional Cardiology

## 2014-12-10 VITALS — BP 120/70 | HR 96 | Ht <= 58 in | Wt 137.8 lb

## 2014-12-10 DIAGNOSIS — G459 Transient cerebral ischemic attack, unspecified: Secondary | ICD-10-CM

## 2014-12-10 DIAGNOSIS — I10 Essential (primary) hypertension: Secondary | ICD-10-CM | POA: Diagnosis not present

## 2014-12-10 DIAGNOSIS — E782 Mixed hyperlipidemia: Secondary | ICD-10-CM | POA: Diagnosis not present

## 2014-12-10 LAB — BASIC METABOLIC PANEL
BUN: 11 mg/dL (ref 6–23)
CHLORIDE: 104 meq/L (ref 96–112)
CO2: 29 meq/L (ref 19–32)
CREATININE: 0.85 mg/dL (ref 0.40–1.20)
Calcium: 9.6 mg/dL (ref 8.4–10.5)
GFR: 82.81 mL/min (ref >60.00)
Glucose, Bld: 83 mg/dL (ref 70–99)
Potassium: 3.5 mEq/L (ref 3.5–5.1)
Sodium: 140 mEq/L (ref 135–145)

## 2014-12-10 NOTE — Progress Notes (Signed)
Cardiology Office Note   Date:  12/10/2014   ID:  Stacy Page, DOB 12/06/1932, MRN [id number]  PCP:  Olga Millers, MD  Cardiologist:   Sinclair Grooms, MD   Chief Complaint  Patient presents with  . Hypertension      History of Present Illness: Stacy Page is a 81 y.o. female who presents for  History of accelerated hypertension history of LVH or heart failure.    On her current medical regimen she feels slightly dizzy. This occurred right any adjustments so we may. As lower extremity edema. She has not had syncope. She denies chest pain.    Past Medical History  Diagnosis Date  . Hypertension   . Hyperlipidemia   . Allergy     seasonal  . Asthma   . GERD (gastroesophageal reflux disease)     Past Surgical History  Procedure Laterality Date  . Bunionectomy    . Carpal tunnel release      Bilaterally  . Abdominal hysterectomy      for fibroids (no BSO)  . Macular degeneration      Dr Zadie Rhine  ; injections  . Colonoscopy  2011    tiny polyp ; [facility]     Current Outpatient Prescriptions  Medication Sig Dispense Refill  . amLODipine (NORVASC) 5 MG tablet Take 1 tablet (5 mg total) by mouth daily. 30 tablet 5  . Ascorbic Acid (VITAMIN C) 500 MG tablet Take 500 mg by mouth daily.      Marland Kitchen atorvastatin (LIPITOR) 40 MG tablet 1/2 pill M,W, F, & Sun 30 tablet 2  . Calcium Carbonate (CALTRATE 600 PO) Take 1,200 mg by mouth daily.     . Cholecalciferol (VITAMIN D3) 2000 UNITS capsule Take 2,000 Units by mouth daily.    . clopidogrel (PLAVIX) 75 MG tablet Take 1 tablet (75 mg total) by mouth daily. 30 tablet 6  . co-enzyme Q-10 30 MG capsule Take 30 mg by mouth daily.     . [employer] GARLIC ODORLESS PO Take 1 tablet by mouth daily.    . cycloSPORINE (RESTASIS) 0.05 % ophthalmic emulsion Place 1 drop into both eyes 2 (two) times daily.    Marland Kitchen FEVERFEW PO Take 2 capsules by mouth daily. For vision    . Fish Oil-Cholecalciferol (FISH OIL + D3 PO) Take 2 capsules  by mouth daily.    . fluticasone (FLONASE) 50 MCG/ACT nasal spray Place 2 sprays into both nostrils daily. 16 g 0  . furosemide (LASIX) 40 MG tablet Take 1 tablet (40 mg total) by mouth daily. 30 tablet 5  . gabapentin (NEURONTIN) 100 MG capsule one every 8 hours as needed. (Patient taking differently: one every 8 hours as needed.) 90 capsule 2  . Horse Chestnut 300 MG TABS Take 600 mg by mouth daily.     . hydrALAZINE (APRESOLINE) 50 MG tablet TAKE 1 TABLET BY MOUTH THREE TIMES DAILY (Patient taking differently: TAKE 1 TABLET BY MOUTH TWO TIMES DAILY) 90 tablet 0  . LORazepam (ATIVAN) 0.5 MG tablet TAKE 1 TABLET BY MOUTH EVERY 8 HOURS AS NEEDED FOR ANXIETY 30 tablet 0  . meclizine (ANTIVERT) 25 MG tablet TAKE 1/2 TO 1 TABLET BY MOUTH THREE TIMES DAILY AS NEEDED FOR DIZZINESS OR NAUSEA 15 tablet 0  . olmesartan (BENICAR) 40 MG tablet Take 40 mg by mouth daily.    . pantoprazole (PROTONIX) 20 MG tablet Take 1 tablet (20 mg total) by mouth daily. 90 tablet 1  .  potassium chloride (K-DUR,KLOR-CON) 10 MEQ tablet Take 10 mEq by mouth 2 (two) times daily.    Marland Kitchen PROAIR HFA 108 (90 BASE) MCG/ACT inhaler INHALE 1 TO 2 PUFFS BY MOUTH EVERY 4 HOURS AS NEEDED 8.5 g 0   No current facility-administered medications for this visit.    Allergies:   Bystolic; Aspirin; Cardizem; Amlodipine besylate; Metoclopramide hcl; and Spironolactone    Social History:  The patient  reports that she quit smoking about 35 years ago. Her smoking use included Cigarettes. She has never used smokeless tobacco. She reports that she does not drink alcohol or use illicit drugs.   Family History:  The patient's family history includes Alzheimer's disease in her mother; Diabetes in an other family member; Heart attack in her father; Heart attack (age of onset: 54) in her maternal grandmother; Hypertension in her father, maternal grandmother, and mother; Stroke (age of onset: 15) in her mother.    ROS:  Please see the history of  present illness.   Otherwise, review of systems are positive for none.   All other systems are reviewed and negative.    PHYSICAL EXAM: VS:  BP 120/70 mmHg  Pulse 96  Ht 4\' 10"  (1.473 m)  Wt 137 lb 12.8 oz (62.506 kg)  BMI 28.81 kg/m2 , BMI Body mass index is 28.81 kg/(m^2). GEN: Well nourished, well developed, in no acute distress HEENT: normal Neck: no JVD, carotid bruits, or masses Cardiac: RRR; no murmurs, rubs, or gallops,no edema  Respiratory:  clear to auscultation bilaterally, normal work of breathing GI: soft, nontender, nondistended, + BS MS: no deformity or atrophy Skin: warm and dry, no rash Neuro:  Strength and sensation are intact Psych: euthymic mood, full affect   EKG:  EKG is not ordered today.   Recent Labs: 03/12/2014: TSH 1.660 06/13/2014: Hemoglobin 12.8; Platelets 252 09/12/2014: ALT 19; BUN 16; Creatinine 0.93; Potassium 3.4*; Sodium 139    Lipid Panel    Component Value Date/Time   CHOL 172 09/12/2014 1509   CHOL 262* 02/12/2014 1224   TRIG 98.0 09/12/2014 1509   TRIG 77 02/12/2014 1224   HDL 47.40 09/12/2014 1509   HDL 58 02/12/2014 1224   CHOLHDL 4 09/12/2014 1509   VLDL 19.6 09/12/2014 1509   LDLCALC 105* 09/12/2014 1509   LDLCALC 189* 02/12/2014 1224   LDLDIRECT 165.5 09/24/2009 1052      Wt Readings from Last 3 Encounters:  12/10/14 137 lb 12.8 oz (62.506 kg)  09/12/14 139 lb 12.8 oz (63.413 kg)  09/10/14 139 lb 12.8 oz (63.413 kg)      Other studies Reviewed: Additional studies/ records that were reviewed today include: . Review of the above records demonstrates:    ASSESSMENT AND PLAN:  Essential hypertension: controlled  HYPERLIPIDEMIA: on therapy  Transient cerebral ischemia, unspecified transient cerebral ischemia type     Current medicines are reviewed at length with the patient today.  The patient does not have concerns regarding medicines.  The following changes have been made:  no change  Labs/ tests ordered  today include:  No orders of the defined types were placed in this encounter.     Disposition:   FU with Linard Millers in 9 months   Signed, Sinclair Grooms, MD  12/10/2014 2:18 PM    [facility] [address], [place], [place]  [address] Phone: [phone]; Fax: [phone]

## 2014-12-10 NOTE — Patient Instructions (Signed)
Medication Instructions:  Your physician recommends that you continue on your current medications as directed. Please refer to the Current Medication list given to you today.   Labwork: Bmet Today  Testing/Procedures: None   Follow-Up: Your physician wants you to follow-up in: 8 months with Dr.Smith You will receive a reminder letter in the mail two months in advance. If you don't receive a letter, please call our office to schedule the follow-up appointment.   Any Other Special Instructions Will Be Listed Below (If Applicable).

## 2014-12-11 ENCOUNTER — Other Ambulatory Visit: Payer: Self-pay | Admitting: Internal Medicine

## 2014-12-16 ENCOUNTER — Other Ambulatory Visit: Payer: Self-pay | Admitting: Internal Medicine

## 2014-12-17 ENCOUNTER — Ambulatory Visit: Payer: Medicare Other | Admitting: Nurse Practitioner

## 2014-12-18 ENCOUNTER — Ambulatory Visit (INDEPENDENT_AMBULATORY_CARE_PROVIDER_SITE_OTHER): Payer: Medicare Other | Admitting: Internal Medicine

## 2014-12-18 ENCOUNTER — Encounter: Payer: Self-pay | Admitting: Internal Medicine

## 2014-12-18 VITALS — BP 138/76 | HR 69 | Temp 97.9°F | Resp 14 | Ht <= 58 in | Wt 142.4 lb

## 2014-12-18 DIAGNOSIS — Z0000 Encounter for general adult medical examination without abnormal findings: Secondary | ICD-10-CM

## 2014-12-18 DIAGNOSIS — R42 Dizziness and giddiness: Secondary | ICD-10-CM

## 2014-12-18 DIAGNOSIS — E782 Mixed hyperlipidemia: Secondary | ICD-10-CM

## 2014-12-18 DIAGNOSIS — I10 Essential (primary) hypertension: Secondary | ICD-10-CM

## 2014-12-18 NOTE — Assessment & Plan Note (Signed)
She is due for bone density scan which she has scheduled to do, she will provide the results to [place]. She thinks that she has had colon cancer screening and may not need another given her age. She is up to date on tetanus, shingles and pneumonia completed. Takes flu shot yearly. Given list of information about 10 year health screening. Non-smoker now. Talked to her about exercise and she has stopped recently and she needs to get back into exercising.

## 2014-12-18 NOTE — Progress Notes (Signed)
Pre visit review using our clinic review tool, if applicable. No additional management support is needed unless otherwise documented below in the visit note. 

## 2014-12-18 NOTE — Progress Notes (Signed)
   Subjective:    Patient ID: Stacy Page, female    DOB: 1933/01/15, 81 y.o.   MRN: [id number]  HPI Here for medicare wellness, no new complaints. Please see A/P for status and treatment of chronic medical problems. Still struggling with some mild dizziness but has not tried the exercises.   Diet: heart healthy Physical activity: sedentary Depression/mood screen: negative Hearing: intact to whispered voice Visual acuity: grossly normal in right, left eye macular degeneration but able to see gross objects, able to drive but not at night ADLs: capable Fall risk: none Home safety: good Cognitive evaluation: intact to orientation, naming, recall and repetition EOL planning: adv directives discussed and in place  I have personally reviewed and have noted 1. The patient's medical and social history - reviewed today no changes 2. Their use of alcohol, tobacco or illicit drugs 3. Their current medications and supplements 4. The patient's functional ability including ADL's, fall risks, home safety risks and hearing or visual impairment. 5. Diet and physical activities 6. Evidence for depression or mood disorders 7. Care team reviewed and updated (available in snapshot)  Review of Systems  Constitutional: Negative for fever, activity change, appetite change, fatigue and unexpected weight change.  HENT: Negative.   Respiratory: Negative for cough, chest tightness, shortness of breath and wheezing.   Cardiovascular: Negative for chest pain, palpitations and leg swelling.  Gastrointestinal: Negative for nausea, abdominal pain, diarrhea, constipation and abdominal distention.  Musculoskeletal: Negative.   Skin: Negative.   Neurological: Positive for dizziness. Negative for speech difficulty, weakness, light-headedness, numbness and headaches.  Psychiatric/Behavioral: Negative.       Objective:   Physical Exam  Constitutional: She is oriented to person, place, and time. She appears  well-developed and well-nourished.  HENT:  Head: Normocephalic and atraumatic.  Eyes: EOM are normal.  Neck: Normal range of motion.  Cardiovascular: Normal rate and regular rhythm.   Pulmonary/Chest: Effort normal and breath sounds normal. No respiratory distress. She has no wheezes. She has no rales.  Abdominal: Soft. Bowel sounds are normal. She exhibits no distension. There is no tenderness. There is no rebound.  Musculoskeletal: She exhibits no edema.  Neurological: She is alert and oriented to person, place, and time. Coordination normal.  Skin: Skin is warm and dry.   Filed Vitals:   12/18/14 0938  BP: 138/76  Pulse: 69  Temp: 97.9 F (36.6 C)  TempSrc: Oral  Resp: 14  Height: 4\' 10"  (1.473 m)  Weight: 142 lb 6.4 oz (64.592 kg)  SpO2: 97%      Assessment & Plan:

## 2014-12-18 NOTE — Patient Instructions (Signed)
You are doing well and we do not need any blood work today.   Think about going back to exercising 3 times per week to keep the health good and the blood pressure controlled better.   Come back in about 6 months. Make sure to send [place] the results of the bone density.  Health Maintenance Adopting a healthy lifestyle and getting preventive care can go a long way to promote health and wellness. Talk with your health care provider about what schedule of regular examinations is right for you. This is a good chance for you to check in with your provider about disease prevention and staying healthy. In between checkups, there are plenty of things you can do on your own. Experts have done a lot of research about which lifestyle changes and preventive measures are most likely to keep you healthy. Ask your health care provider for more information. WEIGHT AND DIET  Eat a healthy diet  Be sure to include plenty of vegetables, fruits, low-fat dairy products, and lean protein.  Do not eat a lot of foods high in solid fats, added sugars, or salt.  Get regular exercise. This is one of the most important things you can do for your health.  Most adults should exercise for at least 150 minutes each week. The exercise should increase your heart rate and make you sweat (moderate-intensity exercise).  Most adults should also do strengthening exercises at least twice a week. This is in addition to the moderate-intensity exercise.  Maintain a healthy weight  Body mass index (BMI) is a measurement that can be used to identify possible weight problems. It estimates body fat based on height and weight. Your health care provider can help determine your BMI and help you achieve or maintain a healthy weight.  For females 35 years of age and older:   A BMI below 18.5 is considered underweight.  A BMI of 18.5 to 24.9 is normal.  A BMI of 25 to 29.9 is considered overweight.  A BMI of 30 and above is considered  obese.  Watch levels of cholesterol and blood lipids  You should start having your blood tested for lipids and cholesterol at 79 years of age, then have this test every 5 years.  You may need to have your cholesterol levels checked more often if:  Your lipid or cholesterol levels are high.  You are older than 79 years of age.  You are at high risk for heart disease.  CANCER SCREENING   Lung Cancer  Lung cancer screening is recommended for adults 63-28 years old who are at high risk for lung cancer because of a history of smoking.  A yearly low-dose CT scan of the lungs is recommended for people who:  Currently smoke.  Have quit within the past 15 years.  Have at least a 30-pack-year history of smoking. A pack year is smoking an average of one pack of cigarettes a day for 1 year.  Yearly screening should continue until it has been 15 years since you quit.  Yearly screening should stop if you develop a health problem that would prevent you from having lung cancer treatment.  Breast Cancer  Practice breast self-awareness. This means understanding how your breasts normally appear and feel.  It also means doing regular breast self-exams. Let your health care provider know about any changes, no matter how small.  If you are in your 20s or 30s, you should have a clinical breast exam (CBE) by a health  care provider every 1-3 years as part of a regular health exam.  If you are 73 or older, have a CBE every year. Also consider having a breast X-ray (mammogram) every year.  If you have a family history of breast cancer, talk to your health care provider about genetic screening.  If you are at high risk for breast cancer, talk to your health care provider about having an MRI and a mammogram every year.  Breast cancer gene (BRCA) assessment is recommended for women who have family members with BRCA-related cancers. BRCA-related cancers  include:  Breast.  Ovarian.  Tubal.  Peritoneal cancers.  Results of the assessment will determine the need for genetic counseling and BRCA1 and BRCA2 testing. Cervical Cancer Routine pelvic examinations to screen for cervical cancer are no longer recommended for nonpregnant women who are considered low risk for cancer of the pelvic organs (ovaries, uterus, and vagina) and who do not have symptoms. A pelvic examination may be necessary if you have symptoms including those associated with pelvic infections. Ask your health care provider if a screening pelvic exam is right for you.   The Pap test is the screening test for cervical cancer for women who are considered at risk.  If you had a hysterectomy for a problem that was not cancer or a condition that could lead to cancer, then you no longer need Pap tests.  If you are older than 65 years, and you have had normal Pap tests for the past 10 years, you no longer need to have Pap tests.  If you have had past treatment for cervical cancer or a condition that could lead to cancer, you need Pap tests and screening for cancer for at least 20 years after your treatment.  If you no longer get a Pap test, assess your risk factors if they change (such as having a new sexual partner). This can affect whether you should start being screened again.  Some women have medical problems that increase their chance of getting cervical cancer. If this is the case for you, your health care provider may recommend more frequent screening and Pap tests.  The human papillomavirus (HPV) test is another test that may be used for cervical cancer screening. The HPV test looks for the virus that can cause cell changes in the cervix. The cells collected during the Pap test can be tested for HPV.  The HPV test can be used to screen women 48 years of age and older. Getting tested for HPV can extend the interval between normal Pap tests from three to five years.  An HPV  test also should be used to screen women of any age who have unclear Pap test results.  After 79 years of age, women should have HPV testing as often as Pap tests.  Colorectal Cancer  This type of cancer can be detected and often prevented.  Routine colorectal cancer screening usually begins at 79 years of age and continues through 79 years of age.  Your health care provider may recommend screening at an earlier age if you have risk factors for colon cancer.  Your health care provider may also recommend using home test kits to check for hidden blood in the stool.  A small camera at the end of a tube can be used to examine your colon directly (sigmoidoscopy or colonoscopy). This is done to check for the earliest forms of colorectal cancer.  Routine screening usually begins at age 62.  Direct examination of the  colon should be repeated every 5-10 years through 79 years of age. However, you may need to be screened more often if early forms of precancerous polyps or small growths are found. Skin Cancer  Check your skin from head to toe regularly.  Tell your health care provider about any new moles or changes in moles, especially if there is a change in a mole's shape or color.  Also tell your health care provider if you have a mole that is larger than the size of a pencil eraser.  Always use sunscreen. Apply sunscreen liberally and repeatedly throughout the day.  Protect yourself by wearing long sleeves, pants, a wide-brimmed hat, and sunglasses whenever you are outside. HEART DISEASE, DIABETES, AND HIGH BLOOD PRESSURE   Have your blood pressure checked at least every 1-2 years. High blood pressure causes heart disease and increases the risk of stroke.  If you are between 78 years and 29 years old, ask your health care provider if you should take aspirin to prevent strokes.  Have regular diabetes screenings. This involves taking a blood sample to check your fasting blood sugar  level.  If you are at a normal weight and have a low risk for diabetes, have this test once every three years after 79 years of age.  If you are overweight and have a high risk for diabetes, consider being tested at a younger age or more often. PREVENTING INFECTION  Hepatitis B  If you have a higher risk for hepatitis B, you should be screened for this virus. You are considered at high risk for hepatitis B if:  You were born in a country where hepatitis B is common. Ask your health care provider which countries are considered high risk.  Your parents were born in a high-risk country, and you have not been immunized against hepatitis B (hepatitis B vaccine).  You have HIV or AIDS.  You use needles to inject street drugs.  You live with someone who has hepatitis B.  You have had sex with someone who has hepatitis B.  You get hemodialysis treatment.  You take certain medicines for conditions, including cancer, organ transplantation, and autoimmune conditions. Hepatitis C  Blood testing is recommended for:  Everyone born from [birth year] through 1963.  Anyone with known risk factors for hepatitis C. Sexually transmitted infections (STIs)  You should be screened for sexually transmitted infections (STIs) including gonorrhea and chlamydia if:  You are sexually active and are younger than 79 years of age.  You are older than 79 years of age and your health care provider tells you that you are at risk for this type of infection.  Your sexual activity has changed since you were last screened and you are at an increased risk for chlamydia or gonorrhea. Ask your health care provider if you are at risk.  If you do not have HIV, but are at risk, it may be recommended that you take a prescription medicine daily to prevent HIV infection. This is called pre-exposure prophylaxis (PrEP). You are considered at risk if:  You are sexually active and do not regularly use condoms or know the HIV status  of your partner(s).  You take drugs by injection.  You are sexually active with a partner who has HIV. Talk with your health care provider about whether you are at high risk of being infected with HIV. If you choose to begin PrEP, you should first be tested for HIV. You should then be tested every 3 months  for as long as you are taking PrEP.  PREGNANCY   If you are premenopausal and you may become pregnant, ask your health care provider about preconception counseling.  If you may become pregnant, take 400 to 800 micrograms (mcg) of folic acid every day.  If you want to prevent pregnancy, talk to your health care provider about birth control (contraception). OSTEOPOROSIS AND MENOPAUSE   Osteoporosis is a disease in which the bones lose minerals and strength with aging. This can result in serious bone fractures. Your risk for osteoporosis can be identified using a bone density scan.  If you are 64 years of age or older, or if you are at risk for osteoporosis and fractures, ask your health care provider if you should be screened.  Ask your health care provider whether you should take a calcium or vitamin D supplement to lower your risk for osteoporosis.  Menopause may have certain physical symptoms and risks.  Hormone replacement therapy may reduce some of these symptoms and risks. Talk to your health care provider about whether hormone replacement therapy is right for you.  HOME CARE INSTRUCTIONS   Schedule regular health, dental, and eye exams.  Stay current with your immunizations.   Do not use any tobacco products including cigarettes, chewing tobacco, or electronic cigarettes.  If you are pregnant, do not drink alcohol.  If you are breastfeeding, limit how much and how often you drink alcohol.  Limit alcohol intake to no more than 1 drink per day for nonpregnant women. One drink equals 12 ounces of beer, 5 ounces of wine, or 1 ounces of hard liquor.  Do not use street  drugs.  Do not share needles.  Ask your health care provider for help if you need support or information about quitting drugs.  Tell your health care provider if you often feel depressed.  Tell your health care provider if you have ever been abused or do not feel safe at home. Document Released: 02/22/2011 Document Revised: 12/24/2013 Document Reviewed: 07/11/2013 [facility] Patient Information 2015 [place], [place]. This information is not intended to replace advice given to you by your health care provider. Make sure you discuss any questions you have with your health care provider.

## 2014-12-18 NOTE — Assessment & Plan Note (Signed)
Has not tried the exercises and encouraged her to do so.

## 2014-12-18 NOTE — Assessment & Plan Note (Signed)
Patient is taking lipitor and last LDL at goal. No repeat today and no side effects, continue current therapy.

## 2014-12-18 NOTE — Assessment & Plan Note (Signed)
Is controlled now on amlodipine, hydralazine, benicar, and lasix. Labs done last week with no abnormality and no indication for change of therapy. BP at goal and she is working on not letting her medicines run out before refilling.

## 2014-12-24 ENCOUNTER — Encounter: Payer: Self-pay | Admitting: Physician Assistant

## 2014-12-24 ENCOUNTER — Ambulatory Visit (INDEPENDENT_AMBULATORY_CARE_PROVIDER_SITE_OTHER): Payer: Medicare Other | Admitting: Physician Assistant

## 2014-12-24 ENCOUNTER — Telehealth: Payer: Self-pay | Admitting: *Deleted

## 2014-12-24 VITALS — BP 120/70 | HR 76 | Ht <= 58 in | Wt 140.6 lb

## 2014-12-24 DIAGNOSIS — Z86010 Personal history of colonic polyps: Secondary | ICD-10-CM | POA: Diagnosis not present

## 2014-12-24 DIAGNOSIS — Z7902 Long term (current) use of antithrombotics/antiplatelets: Secondary | ICD-10-CM

## 2014-12-24 MED ORDER — MOVIPREP 100 G PO SOLR: 1.0000 | Freq: Once | ORAL | Status: DC

## 2014-12-24 NOTE — Telephone Encounter (Signed)
Patient has been advised that she may hold plavix x 5 days prior to her colonoscopy on 12/30/14 per Dr Doug Sou. She can restart when appropriate. Patient verbalizes understanding.

## 2014-12-24 NOTE — Patient Instructions (Signed)
You have been scheduled for a colonoscopy. Please follow written instructions given to you at your visit today.  Please pick up your prep supplies at the pharmacy within the next 1-3 days. If you use inhalers (even only as needed), please bring them with you on the day of your procedure. Your physician has requested that you go to www.startemmi.com and enter the access code given to you at your visit today. This web site gives a general overview about your procedure. However, you should still follow specific instructions given to you by our office regarding your preparation for the procedure.  We will contact Dr Doug Sou regarding your Plavix.  You may take a baby aspirin (81 mg) on the days you are off of Plavix (pending Dr Doug Sou agrees to coming off of Plavix).

## 2014-12-24 NOTE — Telephone Encounter (Signed)
12/24/2014  RE: Stacy Page DOB: December 02, 1932 MRN: [id number]  Dear Dr Doug Sou,   We have scheduled the above patient for a colonoscopy procedure. Our records show that she is on anticoagulation therapy.  Please advise as to whether the patient may come off her therapy of Plavix 5 days prior to the procedure, which is scheduled for 12/30/14. Please route your response to Dixon Boos, CMA.  Sincerely,  Dixon Boos

## 2014-12-24 NOTE — Progress Notes (Signed)
Patient ID: Stacy Page, female   DOB: 1932-12-06, 81 y.o.   MRN: [id number]   Subjective:    Patient ID: Stacy Page, female    DOB: March 22, 1933, 81 y.o.   MRN: [id number]  HPI Stacy Page is a pleasant 81 year old African-American female known to Dr. Henrene Page. She had undergone colonoscopy in March 2011 and had one diminutive polyp removed at that time which was a tubular adenoma. Otherwise negative exam. Patient comes in today to discuss follow-up colonoscopy. She is already scheduled for colonoscopy on May 9 with Dr. Henrene Page. She has been on Plavix over the past 7 months because of a TIA in October 2015. She also has prior history of TIAs but had not been maintained on medication prior to this last episode. She has history of hypertension and hyperlipidemia and otherwise has been in fairly good health. She has no specific GI complaints today no problems with abdominal pain and changes in bowel habits melena or hematochezia. He has not had any recurrent neurologic symptoms.  Review of Systems Pertinent positive and negative review of systems were noted in the above HPI section.  All other review of systems was otherwise negative.  Outpatient Encounter Prescriptions as of 12/24/2014  Medication Sig  . amLODipine (NORVASC) 5 MG tablet Take 1 tablet (5 mg total) by mouth daily.  . Ascorbic Acid (VITAMIN C) 500 MG tablet Take 500 mg by mouth daily.    Marland Kitchen atorvastatin (LIPITOR) 40 MG tablet 1/2 pill M,W, F, & Sun  . Calcium Carbonate (CALTRATE 600 PO) Take 1,200 mg by mouth daily.   . Cholecalciferol (VITAMIN D3) 2000 UNITS capsule Take 2,000 Units by mouth daily.  . clopidogrel (PLAVIX) 75 MG tablet Take 1 tablet (75 mg total) by mouth daily.  Marland Kitchen co-enzyme Q-10 30 MG capsule Take 30 mg by mouth daily.   . [employer] GARLIC ODORLESS PO Take 1 tablet by mouth daily.  . cycloSPORINE (RESTASIS) 0.05 % ophthalmic emulsion Place 1 drop into both eyes 2 (two) times daily.  Marland Kitchen FEVERFEW PO Take 2 capsules by mouth daily.  For vision  . Fish Oil-Cholecalciferol (FISH OIL + D3 PO) Take 2 capsules by mouth daily.  . fluticasone (FLONASE) 50 MCG/ACT nasal spray USE 2 SPRAYS IN EACH NOSTRIL DAILY  . furosemide (LASIX) 40 MG tablet Take 1 tablet (40 mg total) by mouth daily.  Marland Kitchen gabapentin (NEURONTIN) 100 MG capsule one every 8 hours as needed. (Patient taking differently: one every 8 hours as needed.)  . Horse Chestnut 300 MG TABS Take 600 mg by mouth daily.   . hydrALAZINE (APRESOLINE) 50 MG tablet TAKE 1 TABLET BY MOUTH THREE TIMES DAILY.  Marland Kitchen LORazepam (ATIVAN) 0.5 MG tablet TAKE 1 TABLET BY MOUTH EVERY 8 HOURS AS NEEDED FOR ANXIETY  . meclizine (ANTIVERT) 25 MG tablet TAKE 1/2 TO 1 TABLET BY MOUTH THREE TIMES DAILY AS NEEDED FOR DIZZINESS OR NAUSEA  . olmesartan (BENICAR) 40 MG tablet Take 40 mg by mouth daily.  . pantoprazole (PROTONIX) 20 MG tablet Take 1 tablet (20 mg total) by mouth daily.  . potassium chloride (K-DUR,KLOR-CON) 10 MEQ tablet Take 10 mEq by mouth 2 (two) times daily.  Marland Kitchen PROAIR HFA 108 (90 BASE) MCG/ACT inhaler INHALE 1 TO 2 PUFFS BY MOUTH EVERY 4 HOURS AS NEEDED  . MOVIPREP 100 G SOLR Take 1 kit (200 g total) by mouth once.   No facility-administered encounter medications on file as of 12/24/2014.   Allergies  Allergen Reactions  . Bystolic [Nebivolol Hcl]  Held by Hospitalist @ 05/22/14 admission due to bradycardia (P 44)  . Aspirin Other (See Comments)    gastritis  . Cardizem [Diltiazem Hcl] Other (See Comments)    bradycardia  . Metoclopramide Hcl Other (See Comments)    UNKNOWN  . Spironolactone Other (See Comments)    unknown   Patient Active Problem List   Diagnosis Date Noted  . Routine general medical examination at a health care facility 12/18/2014  . Dizziness 09/10/2014  . TIA (transient ischemic attack) 05/23/2014  . Accelerated hypertension 03/12/2014  . GERD 08/29/2008  . HYPERLIPIDEMIA 06/14/2007  . ASTHMA 06/14/2007  . ALLERGIC RHINITIS 11/07/2006  . SPINAL  STENOSIS 11/07/2006   History   Social History  . Marital Status: Divorced    Spouse Name: N/A  . Number of Children: N/A  . Years of Education: N/A   Occupational History  . Not on file.   Social History Main Topics  . Smoking status: Former Smoker    Types: Cigarettes    Quit date: 08/24/1979  . Smokeless tobacco: Never Used     Comment: smoked  1966-1981, up to 1 ppd  . Alcohol Use: No  . Drug Use: No  . Sexual Activity: Not on file   Other Topics Concern  . Not on file   Social History Narrative    Stacy Page's family history includes Alzheimer's disease in her mother; Diabetes in an other family member; Heart attack in her father; Heart attack (age of onset: 1) in her maternal grandmother; Hypertension in her father, maternal grandmother, and mother; Stroke (age of onset: 62) in her mother. There is no history of Colon cancer, Esophageal cancer, Pancreatic cancer, Kidney disease, or Liver disease.      Objective:    Filed Vitals:   12/24/14 1436  BP: 120/70  Pulse: 76    Physical Exam  well-developed elderly African-American female in no acute distress, pleasant blood pressure 120/70 pulse 76 height 4 foot 10 weight 140, BMI 29. HEENT; nontraumatic normocephalic EOMI PERRLA sclera anicteric, Supple ;no JVD, Cardiovascular; regular rate and rhythm with S1-S2 no murmur or gallop, Pulmonary; clear bilaterally, Abdomen ;soft nontender nondistended bowel sounds are active no palpable mass or hepatosplenomegaly, Rectal ;exam not done, Extremities; no clubbing cyanosis or edema skin warm and dry, Psych; mood and affect appropriate       Assessment & Plan:   #1 81 yo female with hx of adenomatous colon polyps-due for follow up colonoscopy, asymptomatic #2 Chronic antiplatelet therapy- on Plavix #3 Hx of TIA's last 10 /2015 #4 HTN #5 spinal stenosis  Plan; Pt already scheduled for Colonoscopy on 12/30/2014 with Dr. Henrene Page  Will hold Plavix for 5 days prior to  procedure , she may take baby ASA Will communicate with her PCP Dr. Doug Page regarding holding plavix ,and assure this is reasonable for this pt.    [name] S Esterwood PA-C 12/24/2014   Cc: Olga Millers, MD

## 2014-12-24 NOTE — Telephone Encounter (Signed)
Yes, she is able to come off plavix 5 days prior to procedure and resume when appropriate afterwards (next day typically)

## 2014-12-25 NOTE — Progress Notes (Signed)
Reviewed. [name], let's perform the colonoscopy on Plavix given her cerebrovascular disease. I can remove small polyps without issues. Findings on colonoscopy 5 years ago reassuring. Please let the patient know that she should stay on Plavix. Thank you

## 2014-12-26 ENCOUNTER — Encounter: Payer: Self-pay | Admitting: Internal Medicine

## 2014-12-26 ENCOUNTER — Telehealth: Payer: Self-pay

## 2014-12-26 NOTE — Telephone Encounter (Signed)
-----   Message from Alfredia Ferguson, PA-C sent at 12/25/2014 12:19 PM EDT ----- Stacy Page- please call pt and let her know Dr Henrene Pastor will do her colonoscopy with her on the plavix-so she does not need to stop it... Concerned about TIA;s and he is ok doing procedure with her on it... Thank you ----- Message -----    From: Irene Shipper, MD    Sent: 12/25/2014   8:46 AM      To: Alfredia Ferguson, PA-C    ----- Message -----    From: Alfredia Ferguson, PA-C    Sent: 12/24/2014   4:40 PM      To: Irene Shipper, MD

## 2014-12-26 NOTE — Telephone Encounter (Signed)
Patient contacted with this information. She states she will continue her Plavix. Colonoscopy 12/30/14

## 2014-12-30 ENCOUNTER — Encounter: Payer: Self-pay | Admitting: Internal Medicine

## 2014-12-30 ENCOUNTER — Ambulatory Visit (AMBULATORY_SURGERY_CENTER): Payer: Medicare Other | Admitting: Internal Medicine

## 2014-12-30 VITALS — BP 130/73 | HR 67 | Temp 97.0°F | Resp 16 | Ht <= 58 in | Wt 140.0 lb

## 2014-12-30 DIAGNOSIS — I10 Essential (primary) hypertension: Secondary | ICD-10-CM | POA: Diagnosis not present

## 2014-12-30 DIAGNOSIS — Z86010 Personal history of colonic polyps: Secondary | ICD-10-CM

## 2014-12-30 MED ORDER — SODIUM CHLORIDE 0.9 % IV SOLN: 500.0000 mL | INTRAVENOUS | Status: DC

## 2014-12-30 NOTE — Patient Instructions (Signed)
YOU HAD AN ENDOSCOPIC PROCEDURE TODAY AT THE [facility]:   Refer to the procedure report that was given to you for any specific questions about what was found during the examination.  If the procedure report does not answer your questions, please call your gastroenterologist to clarify.  If you requested that your care partner not be given the details of your procedure findings, then the procedure report has been included in a sealed envelope for you to review at your convenience later.  YOU SHOULD EXPECT: Some feelings of bloating in the abdomen. Passage of more gas than usual.  Walking can help get rid of the air that was put into your GI tract during the procedure and reduce the bloating. If you had a lower endoscopy (such as a colonoscopy or flexible sigmoidoscopy) you may notice spotting of blood in your stool or on the toilet paper. If you underwent a bowel prep for your procedure, you may not have a normal bowel movement for a few days.  Please Note:  You might notice some irritation and congestion in your nose or some drainage.  This is from the oxygen used during your procedure.  There is no need for concern and it should clear up in a day or so.  SYMPTOMS TO REPORT IMMEDIATELY:   Following lower endoscopy (colonoscopy or flexible sigmoidoscopy):  Excessive amounts of blood in the stool  Significant tenderness or worsening of abdominal pains  Swelling of the abdomen that is new, acute  Fever of 100F or higher   For urgent or emergent issues, a gastroenterologist can be reached at any hour by calling [phone].   DIET: Your first meal following the procedure should be a small meal and then it is ok to progress to your normal diet. Heavy or fried foods are harder to digest and may make you feel nauseous or bloated.  Likewise, meals heavy in dairy and vegetables can increase bloating.  Drink plenty of fluids but you should avoid alcoholic beverages for 24  hours.  ACTIVITY:  You should plan to take it easy for the rest of today and you should NOT DRIVE or use heavy machinery until tomorrow (because of the sedation medicines used during the test).    FOLLOW UP: Our staff will call the number listed on your records the next business day following your procedure to check on you and address any questions or concerns that you may have regarding the information given to you following your procedure. If we do not reach you, we will leave a message.  However, if you are feeling well and you are not experiencing any problems, there is no need to return our call.  We will assume that you have returned to your regular daily activities without incident.  If any biopsies were taken you will be contacted by phone or by letter within the next 1-3 weeks.  Please call us at [phone] if you have not heard about the biopsies in 3 weeks.    SIGNATURES/CONFIDENTIALITY: You and/or your care partner have signed paperwork which will be entered into your electronic medical record.  These signatures attest to the fact that that the information above on your After Visit Summary has been reviewed and is understood.  Full responsibility of the confidentiality of this discharge information lies with you and/or your care-partner. 

## 2014-12-30 NOTE — Op Note (Signed)
[facility]  [address] [place], [id number]   COLONOSCOPY PROCEDURE REPORT  PATIENT: Stacy, Page  MR#: [id number] BIRTHDATE: 07/27/1933 , 81  yrs. old GENDER: female ENDOSCOPIST: Eustace Quail, MD REFERRED BE:[id number] Program Recall PROCEDURE DATE:  12/30/2014 PROCEDURE:   Colonoscopy, surveillance First Screening Colonoscopy - Avg.  risk and is 50 yrs.  old or older - No.  Prior Negative Screening - Now for repeat screening. N/A  History of Adenoma - Now for follow-up colonoscopy & has been > or = to 3 yrs.  Yes hx of adenoma.  Has been 3 or more years since last colonoscopy. ASA CLASS:   Class III INDICATIONS:Surveillance due to prior colonic neoplasia and PH Colon Adenoma. Previous examination March 2011 with small tubular adenoma. This examination being performed with the patient on Plavix. MEDICATIONS: Monitored anesthesia care and Propofol 200 mg IV  DESCRIPTION OF PROCEDURE:   After the risks benefits and alternatives of the procedure were thoroughly explained, informed consent was obtained.  The digital rectal exam revealed no abnormalities of the rectum.   The [id number]  endoscope was introduced through the anus and advanced to the cecum, which was identified by both the appendix and ileocecal valve. No adverse events experienced.   The quality of the prep was excellent. (MoviPrep was used)  The instrument was then slowly withdrawn as the colon was fully examined.      COLON FINDINGS: A normal appearing cecum, ileocecal valve, and appendiceal orifice were identified.  The ascending, transverse, descending, sigmoid colon, and rectum appeared unremarkable. Retroflexed views revealed no abnormalities. The time to cecum = 3.9 Withdrawal time = 8.0   The scope was withdrawn and the procedure completed.  COMPLICATIONS: There were no immediate complications.  ENDOSCOPIC IMPRESSION: 1. Normal colonoscopy  RECOMMENDATIONS: 1.  Return to the care of your primary provider.  GI follow up as needed  eSigned:  Eustace Quail, MD 12/30/2014 1:52 PM   cc: The Patient   , Vertell Novak, MD

## 2014-12-30 NOTE — Progress Notes (Signed)
Report to PACU, RN, vss, BBS= Clear.  

## 2014-12-31 ENCOUNTER — Telehealth: Payer: Self-pay | Admitting: *Deleted

## 2014-12-31 NOTE — Telephone Encounter (Signed)
  Follow up Call-  Call back number 12/30/2014  Post procedure Call Back phone  # [phone]  Permission to leave phone message Yes     Patient questions:  Do you have a fever, pain , or abdominal swelling? No. Pain Score  0 *  Have you tolerated food without any problems? Yes.    Have you been able to return to your normal activities? Yes.    Do you have any questions about your discharge instructions: Diet   No. Medications  No. Follow up visit  No.  Do you have questions or concerns about your Care? No.  Actions: * If pain score is 4 or above: No action needed, pain <4.

## 2015-01-09 DIAGNOSIS — H16223 Keratoconjunctivitis sicca, not specified as Sjogren's, bilateral: Secondary | ICD-10-CM | POA: Diagnosis not present

## 2015-01-09 DIAGNOSIS — H3530 Unspecified macular degeneration: Secondary | ICD-10-CM | POA: Diagnosis not present

## 2015-01-10 ENCOUNTER — Other Ambulatory Visit: Payer: Self-pay | Admitting: Internal Medicine

## 2015-01-14 ENCOUNTER — Other Ambulatory Visit: Payer: Self-pay | Admitting: Internal Medicine

## 2015-01-19 ENCOUNTER — Other Ambulatory Visit: Payer: Self-pay | Admitting: Internal Medicine

## 2015-02-06 ENCOUNTER — Other Ambulatory Visit: Payer: Self-pay | Admitting: Internal Medicine

## 2015-02-16 ENCOUNTER — Other Ambulatory Visit: Payer: Self-pay | Admitting: Internal Medicine

## 2015-02-18 ENCOUNTER — Other Ambulatory Visit: Payer: Self-pay | Admitting: Internal Medicine

## 2015-02-20 ENCOUNTER — Telehealth: Payer: Self-pay | Admitting: Internal Medicine

## 2015-02-20 ENCOUNTER — Other Ambulatory Visit: Payer: Self-pay | Admitting: Geriatric Medicine

## 2015-02-20 MED ORDER — CLOPIDOGREL BISULFATE 75 MG PO TABS: 75.0000 mg | ORAL_TABLET | Freq: Every day | ORAL | Status: DC

## 2015-02-20 MED ORDER — PANTOPRAZOLE SODIUM 20 MG PO TBEC: 20.0000 mg | DELAYED_RELEASE_TABLET | Freq: Every day | ORAL | Status: DC

## 2015-02-20 NOTE — Telephone Encounter (Signed)
Patient need a refill of Plavix 75 mg ,  Pantoprazole 20 mg sent to [facility] [address]

## 2015-02-20 NOTE — Telephone Encounter (Signed)
Sent to pharmacy 

## 2015-02-25 ENCOUNTER — Encounter: Payer: Self-pay | Admitting: Gastroenterology

## 2015-02-25 ENCOUNTER — Encounter: Payer: Self-pay | Admitting: Internal Medicine

## 2015-02-26 ENCOUNTER — Other Ambulatory Visit: Payer: Self-pay | Admitting: Internal Medicine

## 2015-03-04 ENCOUNTER — Other Ambulatory Visit: Payer: Self-pay | Admitting: Internal Medicine

## 2015-03-04 ENCOUNTER — Other Ambulatory Visit: Payer: Self-pay | Admitting: Geriatric Medicine

## 2015-03-04 DIAGNOSIS — E782 Mixed hyperlipidemia: Secondary | ICD-10-CM

## 2015-03-04 MED ORDER — ATORVASTATIN CALCIUM 40 MG PO TABS: ORAL_TABLET | ORAL | Status: DC

## 2015-03-06 ENCOUNTER — Other Ambulatory Visit: Payer: Self-pay | Admitting: Interventional Cardiology

## 2015-03-19 ENCOUNTER — Other Ambulatory Visit: Payer: Self-pay

## 2015-03-19 ENCOUNTER — Other Ambulatory Visit: Payer: Self-pay | Admitting: Internal Medicine

## 2015-03-19 DIAGNOSIS — I10 Essential (primary) hypertension: Secondary | ICD-10-CM

## 2015-03-20 ENCOUNTER — Other Ambulatory Visit: Payer: Self-pay

## 2015-03-20 DIAGNOSIS — I10 Essential (primary) hypertension: Secondary | ICD-10-CM

## 2015-03-20 MED ORDER — FUROSEMIDE 40 MG PO TABS: 40.0000 mg | ORAL_TABLET | Freq: Every day | ORAL | Status: DC

## 2015-03-21 NOTE — Telephone Encounter (Signed)
Requested medications were addressed. See refill encounter from 03/20/2015.

## 2015-03-24 ENCOUNTER — Other Ambulatory Visit: Payer: Self-pay | Admitting: Geriatric Medicine

## 2015-03-24 MED ORDER — ATORVASTATIN CALCIUM 40 MG PO TABS: ORAL_TABLET | ORAL | Status: DC

## 2015-03-24 MED ORDER — PANTOPRAZOLE SODIUM 20 MG PO TBEC: 20.0000 mg | DELAYED_RELEASE_TABLET | Freq: Every day | ORAL | Status: DC

## 2015-03-24 MED ORDER — CLOPIDOGREL BISULFATE 75 MG PO TABS: 75.0000 mg | ORAL_TABLET | Freq: Every day | ORAL | Status: DC

## 2015-03-24 MED ORDER — POTASSIUM CHLORIDE CRYS ER 10 MEQ PO TBCR: 10.0000 meq | EXTENDED_RELEASE_TABLET | Freq: Two times a day (BID) | ORAL | Status: DC

## 2015-03-24 MED ORDER — MECLIZINE HCL 25 MG PO TABS: ORAL_TABLET | ORAL | Status: DC

## 2015-03-25 IMAGING — CT CT HEAD W/O CM
1 series · 16 of 28 positions shown, 20 images · non-contrast
Comparison: None.

CLINICAL DATA: Initial valuation for acute dizziness. History of
hypertension.

EXAM:
CT HEAD WITHOUT CONTRAST
TECHNIQUE: Contiguous axial images were obtained from the base of the skull
through the vertex without intravenous contrast.

[Series 2: head 5.0 h30s · axial · 0.46mm/px · z∈[-123,+2]mm · 16 of 28 slices shown, 20 images]
[im 2/28  brain]
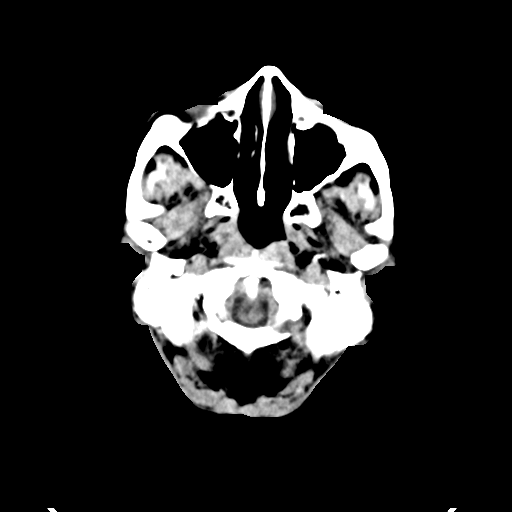
[im 2/28  bone]
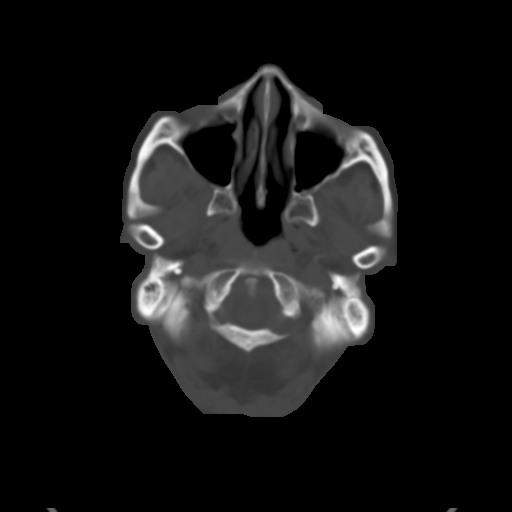
[im 4/28  brain]
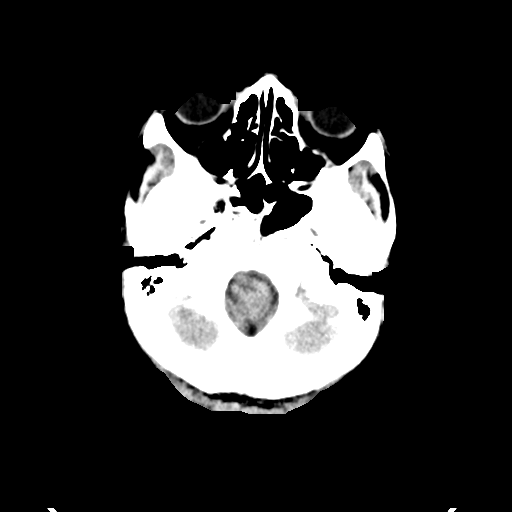
[im 6/28  brain]
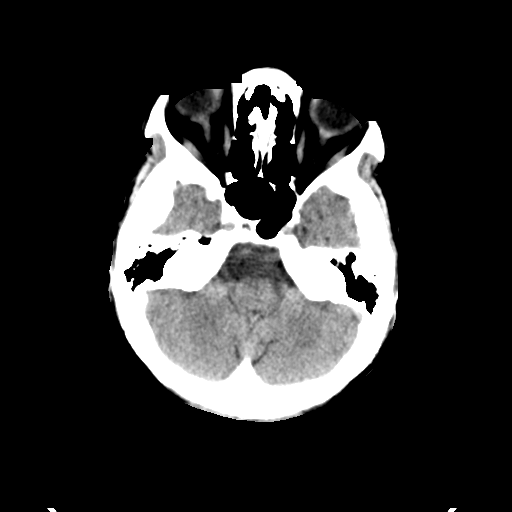
[im 7/28  brain]
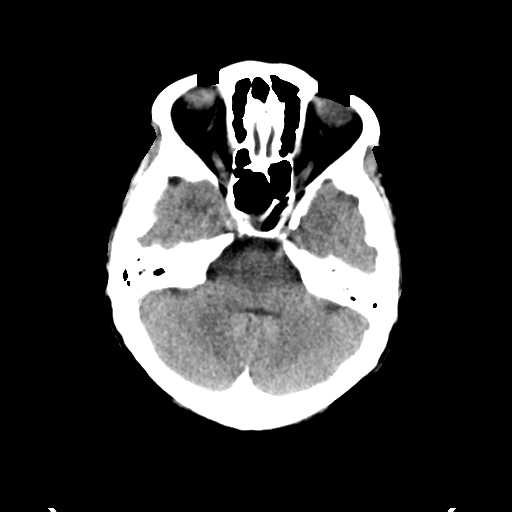
[im 9/28  brain]
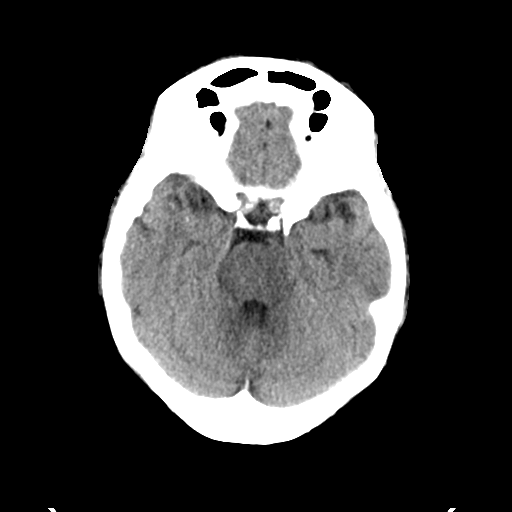
[im 9/28  bone]
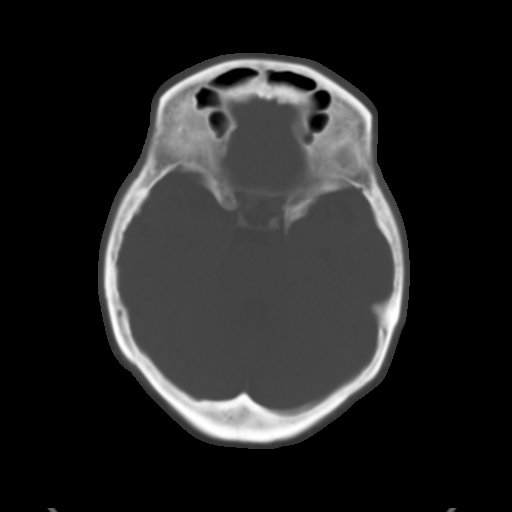
[im 10/28  brain]
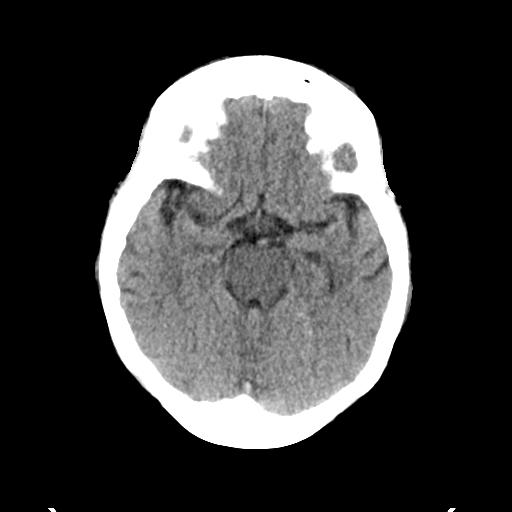
[im 12/28  brain]
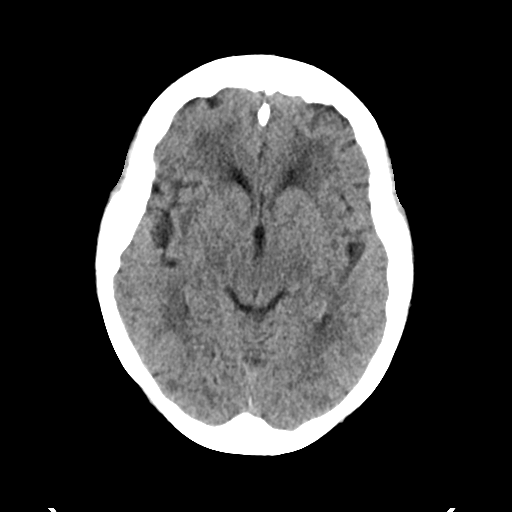
[im 14/28  brain]
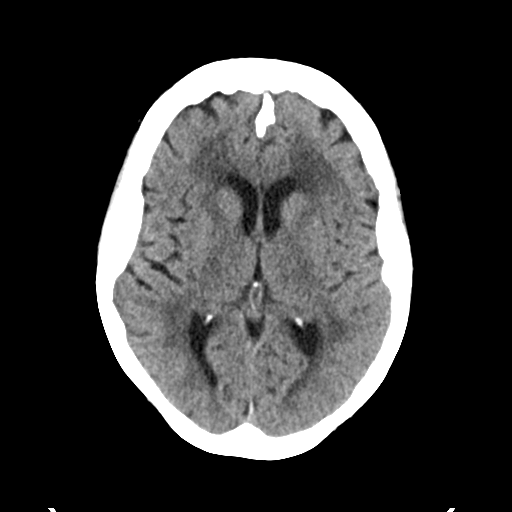
[im 15/28  brain]
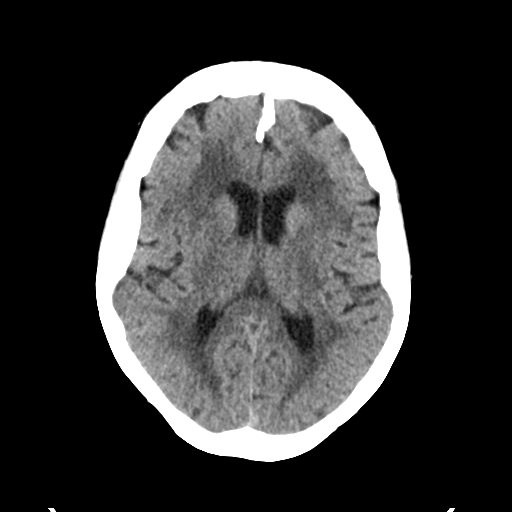
[im 15/28  bone]
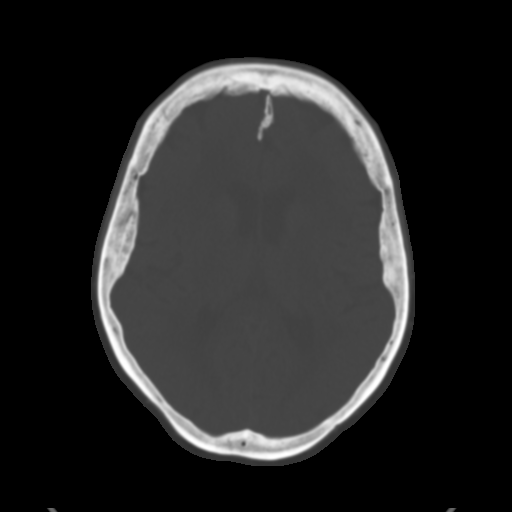
[im 17/28  brain]
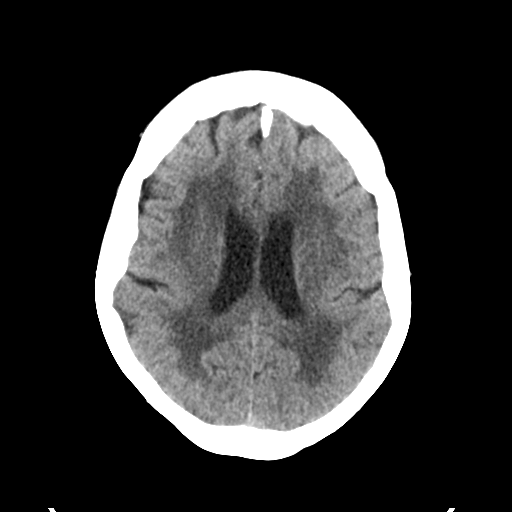
[im 19/28  brain]
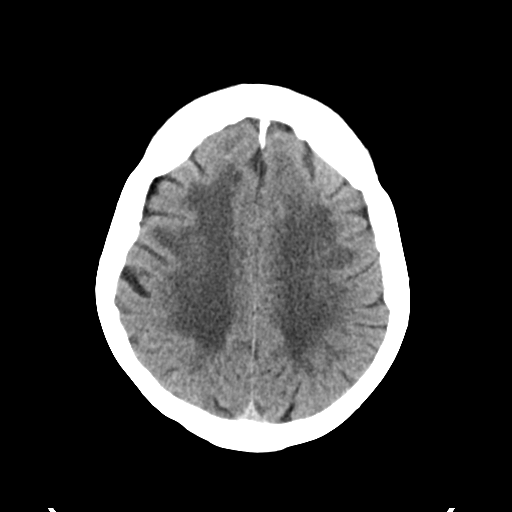
[im 20/28  brain]
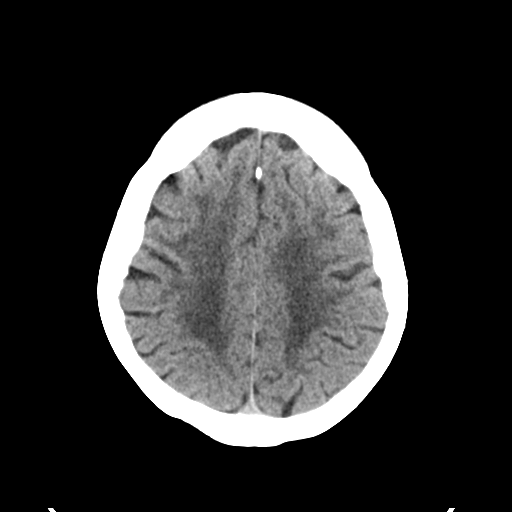
[im 22/28  brain]
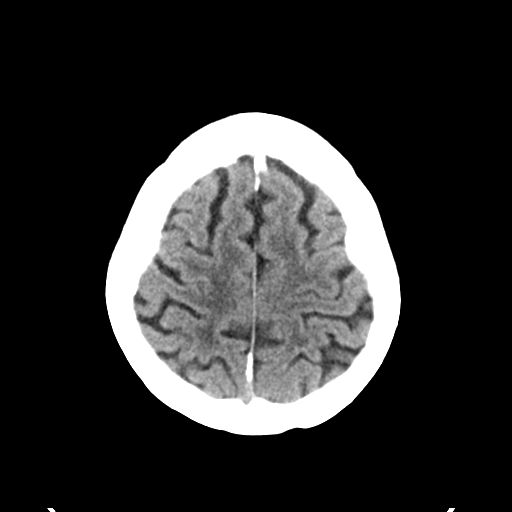
[im 22/28  bone]
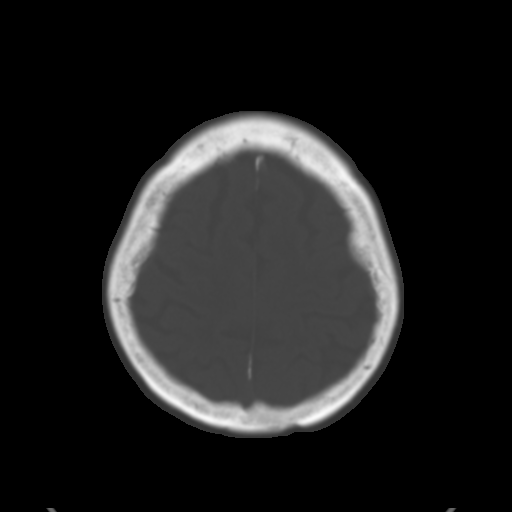
[im 23/28  brain]
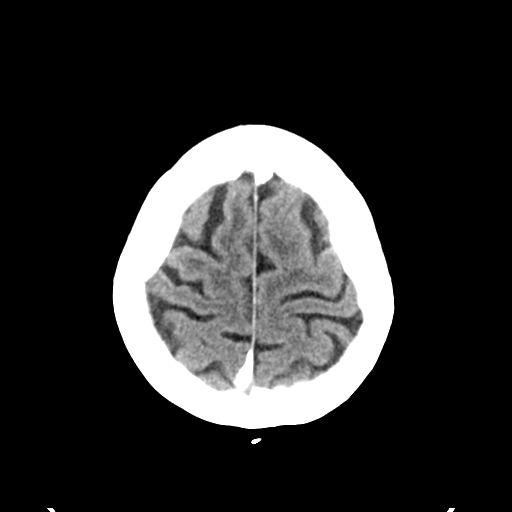
[im 25/28  brain]
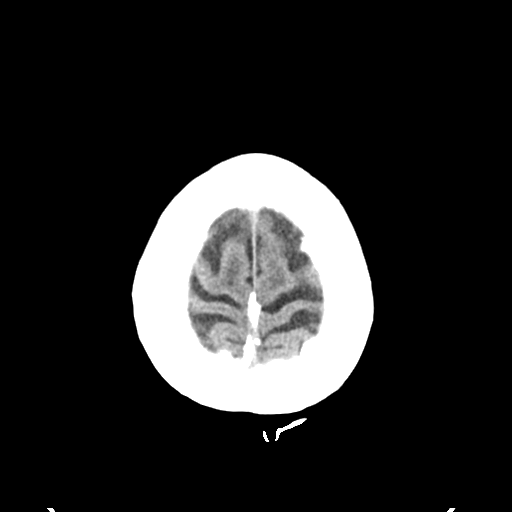
[im 27/28  brain]
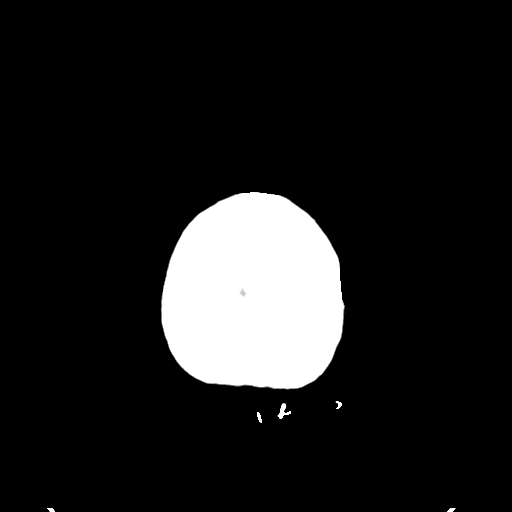

[16 of 28 positions shown; findings below may reference images not displayed]

FINDINGS: Confluent hypodensity within the periventricular and deep white
matter both cerebral hemispheres again seen, most compatible with
moderate chronic small vessel ischemic changes.

There is no acute intracranial hemorrhage or infarct. No mass lesion
or midline shift. Gray-white matter differentiation is well
maintained. Ventricles are normal in size without evidence of
hydrocephalus. CSF containing spaces are within normal limits. No
extra-axial fluid collection.

The calvarium is intact.

Orbital soft tissues are within normal limits.

The paranasal sinuses and mastoid air cells are well pneumatized and
free of fluid.

Scalp soft tissues are unremarkable.
IMPRESSION: 1. No acute intracranial process.
2. Moderate chronic small vessel ischemic disease.

## 2015-03-29 ENCOUNTER — Other Ambulatory Visit: Payer: Self-pay | Admitting: Internal Medicine

## 2015-03-31 ENCOUNTER — Other Ambulatory Visit: Payer: Self-pay | Admitting: *Deleted

## 2015-03-31 ENCOUNTER — Other Ambulatory Visit: Payer: Self-pay | Admitting: Emergency Medicine

## 2015-03-31 DIAGNOSIS — I10 Essential (primary) hypertension: Secondary | ICD-10-CM

## 2015-03-31 MED ORDER — AMLODIPINE BESYLATE 5 MG PO TABS: 5.0000 mg | ORAL_TABLET | Freq: Every day | ORAL | Status: DC

## 2015-03-31 MED ORDER — FUROSEMIDE 40 MG PO TABS: 40.0000 mg | ORAL_TABLET | Freq: Every day | ORAL | Status: DC

## 2015-03-31 MED ORDER — HYDRALAZINE HCL 50 MG PO TABS: 50.0000 mg | ORAL_TABLET | Freq: Three times a day (TID) | ORAL | Status: DC

## 2015-04-03 ENCOUNTER — Other Ambulatory Visit: Payer: Self-pay

## 2015-04-03 MED ORDER — MECLIZINE HCL 25 MG PO TABS: 12.5000 mg | ORAL_TABLET | Freq: Three times a day (TID) | ORAL | Status: DC | PRN

## 2015-04-03 MED ORDER — GABAPENTIN 100 MG PO CAPS: 100.0000 mg | ORAL_CAPSULE | Freq: Three times a day (TID) | ORAL | Status: DC

## 2015-04-03 MED ORDER — HYDRALAZINE HCL 50 MG PO TABS: 50.0000 mg | ORAL_TABLET | Freq: Four times a day (QID) | ORAL | Status: DC

## 2015-04-04 ENCOUNTER — Other Ambulatory Visit: Payer: Self-pay | Admitting: Interventional Cardiology

## 2015-04-08 DIAGNOSIS — L820 Inflamed seborrheic keratosis: Secondary | ICD-10-CM | POA: Diagnosis not present

## 2015-04-12 ENCOUNTER — Other Ambulatory Visit: Payer: Self-pay | Admitting: Internal Medicine

## 2015-04-14 ENCOUNTER — Other Ambulatory Visit: Payer: Self-pay | Admitting: Internal Medicine

## 2015-06-02 ENCOUNTER — Ambulatory Visit: Payer: Medicare Other | Admitting: Internal Medicine

## 2015-06-02 DIAGNOSIS — Z0289 Encounter for other administrative examinations: Secondary | ICD-10-CM

## 2015-06-05 ENCOUNTER — Telehealth: Payer: Self-pay | Admitting: Interventional Cardiology

## 2015-06-05 NOTE — Telephone Encounter (Signed)
Patient calling complaining of swelling in BLE, mostly in ankles. Patient's BP 138/90, 138/70  HR 68, 70 as an average. Patient denies chest pain or SOB. Patient stated that her daughter is allergic to amlodipine and she thinks she is too. Patient stated that her daughter takes verapamil instead and it works for her. Patient was thinking she would like to change to verapamil also. Informed patient that message would be sent to Dr. Tamala Julian for further advisement.

## 2015-06-05 NOTE — Telephone Encounter (Signed)
New Message   Pt stats that she needs to know if he can put her on Verapamil 180mg 

## 2015-06-08 NOTE — Telephone Encounter (Signed)
Stop amlodipine and monitor whether edema resolves.

## 2015-06-09 NOTE — Telephone Encounter (Signed)
Patient verbalized understanding and will call in a couple of days to let the office know if edema has resolves.

## 2015-06-17 DIAGNOSIS — Z1231 Encounter for screening mammogram for malignant neoplasm of breast: Secondary | ICD-10-CM | POA: Diagnosis not present

## 2015-07-01 ENCOUNTER — Other Ambulatory Visit: Payer: Self-pay | Admitting: Internal Medicine

## 2015-07-04 ENCOUNTER — Other Ambulatory Visit: Payer: Self-pay | Admitting: *Deleted

## 2015-07-04 DIAGNOSIS — I10 Essential (primary) hypertension: Secondary | ICD-10-CM

## 2015-07-04 MED ORDER — FUROSEMIDE 40 MG PO TABS: 40.0000 mg | ORAL_TABLET | Freq: Every day | ORAL | Status: DC

## 2015-07-14 ENCOUNTER — Other Ambulatory Visit: Payer: Self-pay | Admitting: Internal Medicine

## 2015-07-23 ENCOUNTER — Encounter: Payer: Self-pay | Admitting: Internal Medicine

## 2015-08-22 ENCOUNTER — Other Ambulatory Visit: Payer: Self-pay | Admitting: Interventional Cardiology

## 2015-09-09 ENCOUNTER — Other Ambulatory Visit: Payer: Self-pay | Admitting: Interventional Cardiology

## 2015-09-17 ENCOUNTER — Encounter: Payer: Self-pay | Admitting: Interventional Cardiology

## 2015-09-17 ENCOUNTER — Ambulatory Visit (INDEPENDENT_AMBULATORY_CARE_PROVIDER_SITE_OTHER): Payer: Medicare Other | Admitting: Interventional Cardiology

## 2015-09-17 VITALS — BP 182/104 | HR 65 | Ht <= 58 in | Wt 148.1 lb

## 2015-09-17 DIAGNOSIS — R9431 Abnormal electrocardiogram [ECG] [EKG]: Secondary | ICD-10-CM | POA: Diagnosis not present

## 2015-09-17 DIAGNOSIS — E782 Mixed hyperlipidemia: Secondary | ICD-10-CM | POA: Diagnosis not present

## 2015-09-17 DIAGNOSIS — I10 Essential (primary) hypertension: Secondary | ICD-10-CM | POA: Diagnosis not present

## 2015-09-17 MED ORDER — LOSARTAN POTASSIUM 100 MG PO TABS: 100.0000 mg | ORAL_TABLET | Freq: Every day | ORAL | Status: DC

## 2015-09-17 MED ORDER — HYDRALAZINE HCL 50 MG PO TABS: 50.0000 mg | ORAL_TABLET | Freq: Three times a day (TID) | ORAL | Status: DC

## 2015-09-17 MED ORDER — CARVEDILOL 6.25 MG PO TABS: 6.2500 mg | ORAL_TABLET | Freq: Two times a day (BID) | ORAL | Status: DC

## 2015-09-17 NOTE — Progress Notes (Signed)
Cardiology Office Note   Date:  09/17/2015   ID:  Stacy Page, DOB May 01, 1933, MRN [id number]  PCP:  Hoyt Koch, MD  Cardiologist:  Sinclair Grooms, MD   Chief Complaint  Patient presents with  . Hypertension      History of Present Illness: Stacy Page is a 82 y.o. female who presents for essential hypertension, and multiple medication intolerances.  Since starting amlodipine she notices lower extremity swelling. She was taken off of Bystolic one year ago because of bradycardia. Diltiazem was discontinued because of bradycardia. She has had difficulty with spiral lactone but can't remember what.  She stopped taking Benicar this shear because it put her in a doughnut hole.  She denies dyspnea and chest pain. But as noted above lower extremity edema has been initiated.  Past Medical History  Diagnosis Date  . Essential hypertension, malignant   . Allergic rhinitis due to pollen   . Acute maxillary sinusitis   . Osteoarthrosis, unspecified whether generalized or localized, unspecified site   . Nonspecific abnormal electrocardiogram (ECG) (EKG)   . Obesity, unspecified   . Intervertebral cervical disc disorder with myelopathy, cervical region   . DVT, lower extremity ([place]) 02/08/12    right  . Headache(784.0)   . Migraines   . Depression   . PONV (postoperative nausea and vomiting)   . GERD (gastroesophageal reflux disease)   . Tendinitis     left arm and hand    Past Surgical History  Procedure Laterality Date  . Cholecystectomy  1995  . Abdominal hysterectomy  1989  . Staple hemorrhoidectomy  1978  . Colectomy  1995    "dr punctured my colon 3 times during ovary removal"  . Salpingostomy  1994; 1995    open; laparoscopic  . Hernia repair  1990's    "2 on left abdomen; 1 ventral"  . Breast cyst excision  1969, 1972, 1997    "right; left; left"  . Cervical discectomy  2011  . Tendon repair  12/2011    "right ankle; not achilles"  .  Cystoscopy N/A 11/24/2012    Procedure: CYSTOSCOPY;  Surgeon: Reece Packer, MD;  Location: [facility];  Service: Urology;  Laterality: N/A;  . Cystocele repair N/A 11/24/2012    Procedure: ANTERIOR REPAIR (CYSTOCELE);  Surgeon: Reece Packer, MD;  Location: [facility];  Service: Urology;  Laterality: N/A;  REPAIR CYSTOCELE     Current Outpatient Prescriptions  Medication Sig Dispense Refill  . diltiazem (CARDIZEM CD) 360 MG 24 hr capsule Take 360 mg by mouth daily.     Marland Kitchen loratadine (CLARITIN) 10 MG tablet Take 10 mg by mouth daily.    . pantoprazole (PROTONIX) 40 MG tablet Take 40 mg by mouth daily.    Marland Kitchen triamterene-hydrochlorothiazide (MAXZIDE-25) 37.5-25 MG per tablet Take 1 tablet by mouth every morning.      No current facility-administered medications for this visit.    Allergies:   Codeine; Dairy aid; Ketek; and Levofloxacin    Social History:  The patient  reports that she has never smoked. She has never used smokeless tobacco. She reports that she does not drink alcohol or use illicit drugs.   Family History:  The patient's family history includes Alzheimer's disease in her brother and sister; Breast cancer in her sister; Heart disease in her father; Hodgkin's lymphoma in her sister; Hypertension in her brother, father, mother, sister, sister, and sister; Other in her brother, brother, sister, sister, sister, and sister; Stroke  in her mother.    ROS:  Please see the history of present illness.   Otherwise, review of systems are positive for adherence to a low salt diet. Lower extremity edema..   All other systems are reviewed and negative.    PHYSICAL EXAM: VS:  BP 186/94 mmHg  Pulse 70  Ht 5\' 6"  (1.676 m)  Wt 224 lb 6.4 oz (101.787 kg)  BMI 36.24 kg/m2  LMP 08/23/1978 , BMI Body mass index is 36.24 kg/(m^2). GEN: Well nourished, well developed, in no acute distress HEENT: normal Neck: no JVD, carotid bruits, or masses Cardiac: RRR.  There is no murmur, rub, or gallop.  There is no edema. Respiratory:  clear to auscultation bilaterally, normal work of breathing. GI: soft, nontender, nondistended, + BS MS: no deformity or atrophy Skin: warm and dry, no rash Neuro:  Strength and sensation are intact Psych: euthymic mood, full affect   EKG:  EKG is ordered today. The ekg reveals normal sinus rhythm with left atrial abnormality.   Recent Labs: No results found for requested labs within last 365 days.    Lipid Panel No results found for: CHOL, TRIG, HDL, CHOLHDL, VLDL, LDLCALC, LDLDIRECT    Wt Readings from Last 3 Encounters:  09/17/15 224 lb 6.4 oz (101.787 kg)  09/12/15 224 lb (101.606 kg)  02/28/15 218 lb 12.8 oz (99.247 kg)      Other studies Reviewed: Additional studies/ records that were reviewed today include: None. The findings include none.    ASSESSMENT AND PLAN:  1. Essential hypertension Poor control but has been off Benicar. Doesn't like amlodipine because of lower extremity swelling.  2. Hyperlipidemia Followed by primary care.  3. Abnormal EKG Minimal ST-T wave abnormality unchanged.    Current medicines are reviewed at length with the patient today.  The patient has the following concerns regarding medicines: Issues with finance. Unable to afford Benicar..  The following changes/actions have been instituted:    Increase hydralazine to 75 mg 3 times per day  Start Cozaar 100 mg per day  Start carvedilol 6.25 mg twice a day  Continue furosemide 40 mg per day  Continue potassium supplement  Basic metabolic panel in 2 weeks  Blood pressure follow-up with the extender in 3-4 weeks. Additional blood pressure options include going up on hydralazine, adding additional diuretic effect by increasing furosemide or starting a generic distal tubular blocker such as Inspra. She has had difficulty with spiral lactone in the past but I am not sure what.  Labs/ tests ordered today include:  No orders of the defined types  were placed in this encounter.     Disposition:   FU with HS in 4 year  Signed, Sinclair Grooms, MD  09/17/2015 10:36 AM    [facility] [place], [place], [place]  [address] Phone: [phone]; Fax: [phone]

## 2015-09-17 NOTE — Patient Instructions (Signed)
Medication Instructions:  Your physician has recommended you make the following change in your medication:  1) STOP Amoodipine 2) INCREASE Hydralazine to 75mg . 3 times daily. 3) START Carvedilol 6.25mg  twice daily 4) START Cozaar 100mg  daily.   Labwork: Your physician recommends that you return for lab work in: 1-2 weeks after starting new medaications.  Testing/Procedures: None ordered  Follow-Up: Your physician recommends that you schedule a follow-up appointment in: 3-4 weeks with an APP  Your physician wants you to follow-up in: 1 year with Dr.Smith You will receive a reminder letter in the mail two months in advance. If you don't receive a letter, please call our office to schedule the follow-up appointment.     Any Other Special Instructions Will Be Listed Below (If Applicable).     If you need a refill on your cardiac medications before your next appointment, please call your pharmacy.

## 2015-09-29 ENCOUNTER — Other Ambulatory Visit: Payer: Self-pay | Admitting: Internal Medicine

## 2015-09-30 NOTE — Telephone Encounter (Signed)
Ativan rx faxed to [facility]

## 2015-10-03 DIAGNOSIS — H16223 Keratoconjunctivitis sicca, not specified as Sjogren's, bilateral: Secondary | ICD-10-CM | POA: Diagnosis not present

## 2015-10-03 DIAGNOSIS — H3530 Unspecified macular degeneration: Secondary | ICD-10-CM | POA: Diagnosis not present

## 2015-10-06 ENCOUNTER — Other Ambulatory Visit: Payer: Self-pay | Admitting: Interventional Cardiology

## 2015-10-06 ENCOUNTER — Other Ambulatory Visit: Payer: Self-pay | Admitting: Internal Medicine

## 2015-10-09 ENCOUNTER — Ambulatory Visit: Payer: Medicare Other | Admitting: Internal Medicine

## 2015-10-14 ENCOUNTER — Encounter: Payer: Self-pay | Admitting: Cardiology

## 2015-10-14 ENCOUNTER — Ambulatory Visit (INDEPENDENT_AMBULATORY_CARE_PROVIDER_SITE_OTHER): Payer: Medicare Other | Admitting: Cardiology

## 2015-10-14 VITALS — BP 170/100 | HR 72 | Ht <= 58 in | Wt 150.0 lb

## 2015-10-14 DIAGNOSIS — Z79899 Other long term (current) drug therapy: Secondary | ICD-10-CM

## 2015-10-14 LAB — BASIC METABOLIC PANEL
BUN: 12 mg/dL (ref 7–25)
CALCIUM: 9.2 mg/dL (ref 8.6–10.4)
CO2: 23 mmol/L (ref 20–31)
CREATININE: 0.86 mg/dL (ref 0.60–0.88)
Chloride: 106 mmol/L (ref 98–110)
Glucose, Bld: 85 mg/dL (ref 65–99)
Potassium: 3.6 mmol/L (ref 3.5–5.3)
Sodium: 141 mmol/L (ref 135–146)

## 2015-10-14 MED ORDER — HYDRALAZINE HCL 100 MG PO TABS: 100.0000 mg | ORAL_TABLET | Freq: Three times a day (TID) | ORAL | Status: DC

## 2015-10-14 NOTE — Patient Instructions (Addendum)
Medication Instructions:  Your physician has recommended you make the following change in your medication:  1) INCREASE Hydralazine to 100 mg three times daily  Labwork: BMET today  Testing/Procedures: None ordered  Follow-Up: Your physician recommends that you schedule a follow-up appointment in: 2 weeks with Gay Filler, pharmacist, for blood pressure check.  Keep your currently scheduled follow up with Dr. Tamala Julian in January 2018.   If you need a refill on your cardiac medications before your next appointment, please call your pharmacy.  Thank you for choosing [facility]!!

## 2015-10-14 NOTE — Progress Notes (Signed)
10/14/2015 [place]   [birth year]  [id number]  Primary Physician Hoyt Koch, MD Primary Cardiologist: Dr. Tamala Julian   Reason for Visit/CC:  4 week F/u for HTN  HPI:  The patient is a 82 y/o female, followed by Dr. Tamala Julian, who presents to clinic for 4 week f/u to reassess BP. She was seen by Dr. Tamala Julian 09/17/15 for f/u for HTN. Per records, she has a history of multiple medication intolerances, "Since starting amlodipine she notices lower extremity swelling. She was taken off of Bystolic one year ago because of bradycardia. Diltiazem was discontinued because of bradycardia. She has had difficulty with spironolactone but can't remember what. She stopped taking Benicar this year because it put her in a doughnut hole".  In clinic on 1/25, her BP was 189/94. The following plan was made by Dr. Tamala Julian:   Increase hydralazine to 75 mg 3 times per day  Start Cozaar 100 mg per day  Start carvedilol 6.25 mg twice a day  Continue furosemide 40 mg per day  Continue potassium supplement  Basic metabolic panel in 2 weeks  Blood pressure follow-up with the extender in 3-4 weeks. Additional blood pressure options include going up on hydralazine, adding additional diuretic effect by increasing furosemide or starting a generic distal tubular blocker such as Inspra.  Today in f/u she reports that she has done well. He BP remains high but she denies any symptoms. Her BP today is 170/100. HR is stable in the 70s. She reports full medication compliance. She avoids salt and caffeine. No caffeine.  BMP was not done. We will obtain today.     Current Outpatient Prescriptions  Medication Sig Dispense Refill  . albuterol (PROVENTIL HFA;VENTOLIN HFA) 108 (90 Base) MCG/ACT inhaler Inhale 1-2 puffs into the lungs every 4 (four) hours as needed for wheezing or shortness of breath.    . Ascorbic Acid (VITAMIN C) 500 MG tablet Take 500 mg by mouth daily.      Marland Kitchen atorvastatin (LIPITOR) 40 MG tablet Take half  tablet (20 mg total) by mouth on Mondays, Wednesdays, Fridays and Sundays.    Marland Kitchen azelastine (ASTELIN) 0.1 % nasal spray Place 2 sprays into both nostrils at bedtime as needed. (allergies)    . Calcium Carbonate (CALTRATE 600 PO) Take 1,200 mg by mouth daily.     . carvedilol (COREG) 6.25 MG tablet Take 1 tablet (6.25 mg total) by mouth 2 (two) times daily. 180 tablet 3  . Cholecalciferol (VITAMIN D3) 2000 UNITS capsule Take 2,000 Units by mouth daily.    . clopidogrel (PLAVIX) 75 MG tablet Take 1 tablet (75 mg total) by mouth daily. 90 tablet 3  . co-enzyme Q-10 30 MG capsule Take 30 mg by mouth daily.     . CVS GARLIC ODORLESS PO Take 1 tablet by mouth daily.    . cycloSPORINE (RESTASIS) 0.05 % ophthalmic emulsion Place 1 drop into both eyes 2 (two) times daily.    Marland Kitchen FEVERFEW PO Take 2 capsules by mouth daily. For vision    . Fish Oil-Cholecalciferol (FISH OIL + D3 PO) Take 2 capsules by mouth daily.    . fluticasone (FLONASE) 50 MCG/ACT nasal spray Place 2 sprays into both nostrils daily as needed for allergies or rhinitis.    . furosemide (LASIX) 40 MG tablet Take 1 tablet by mouth  daily 90 tablet 3  . gabapentin (NEURONTIN) 100 MG capsule Take 100 mg by mouth 3 (three) times daily as needed (leg pain).    Marland Kitchen  gabapentin (NEURONTIN) 100 MG capsule TAKE 1 CAPSULE BY MOUTH 3  TIMES DAILY EVERY 8 HOURS  AS NEEDED. 270 capsule 3  . Horse Chestnut 300 MG TABS Take 600 mg by mouth daily.     . hydrALAZINE (APRESOLINE) 50 MG tablet Take 1 tablet (50 mg total) by mouth 3 (three) times daily. 405 tablet 3  . levocetirizine (XYZAL) 5 MG tablet Take 5 mg by mouth daily as needed. (allergies)    . LORazepam (ATIVAN) 0.5 MG tablet Take 1 tablet (0.5 mg total) by mouth daily as needed for anxiety. 30 tablet 0  . losartan (COZAAR) 100 MG tablet Take 1 tablet (100 mg total) by mouth daily. 90 tablet 3  . meclizine (ANTIVERT) 25 MG tablet Take 0.5-1 tablets (12.5-25 mg total) by mouth 3 (three) times daily as  needed for dizziness. 45 tablet 3  . montelukast (SINGULAIR) 10 MG tablet Take 10 mg by mouth daily as needed. (allergies)    . pantoprazole (PROTONIX) 20 MG tablet Take 1 tablet (20 mg total) by mouth daily. 90 tablet 3  . potassium chloride (K-DUR,KLOR-CON) 10 MEQ tablet Take 1 tablet (10 mEq total) by mouth 2 (two) times daily. 180 tablet 3   No current facility-administered medications for this visit.    Allergies  Allergen Reactions  . Bystolic [Nebivolol Hcl]     Held by Hospitalist @ 05/22/14 admission due to bradycardia (P 44)  . Aspirin Other (See Comments)    gastritis  . Cardizem [Diltiazem Hcl] Other (See Comments)    bradycardia  . Amlodipine Swelling    LE swelling  . Metoclopramide Hcl Other (See Comments)    UNKNOWN  . Spironolactone Other (See Comments)    unknown    Social History   Social History  . Marital Status: Divorced    Spouse Name: N/A  . Number of Children: N/A  . Years of Education: N/A   Occupational History  . Not on file.   Social History Main Topics  . Smoking status: Former Smoker    Types: Cigarettes    Quit date: 08/24/1979  . Smokeless tobacco: Never Used     Comment: smoked  1966-1981, up to 1 ppd  . Alcohol Use: No  . Drug Use: No  . Sexual Activity: Not on file   Other Topics Concern  . Not on file   Social History Narrative     Review of Systems: General: negative for chills, fever, night sweats or weight changes.  Cardiovascular: negative for chest pain, dyspnea on exertion, edema, orthopnea, palpitations, paroxysmal nocturnal dyspnea or shortness of breath Dermatological: negative for rash Respiratory: negative for cough or wheezing Urologic: negative for hematuria Abdominal: negative for nausea, vomiting, diarrhea, bright red blood per rectum, melena, or hematemesis Neurologic: negative for visual changes, syncope, or dizziness All other systems reviewed and are otherwise negative except as noted above.    Blood  pressure 170/100, pulse 72, height 4\' 10"  (1.473 m), weight 150 lb (68.04 kg).  General appearance: alert, cooperative and no distress Neck: no carotid bruit and no JVD Lungs: clear to auscultation bilaterally Heart: regular rate and rhythm, S1, S2 normal, no murmur, click, rub or gallop Extremities: no LEE Pulses: 2+ and symmetric Skin: warm and dry Neurologic: Grossly normal  EKG not performed  ASSESSMENT AND PLAN:   1. HTN: BP remains elevated at 170/100, despite full medication compliance. She avoids salt and caffeine. No tobacco. Per Dr. Darliss Ridgel' recommendations, we will further increase hydralazine to 100  mg TID. Continue all other medications as directed. Check BMP today to assess renal function and K. F/u with pharmacist in 2 weeks for repeat BP check.    Lyda Jester PA-C 10/14/2015 10:44 AM

## 2015-10-15 DIAGNOSIS — H2513 Age-related nuclear cataract, bilateral: Secondary | ICD-10-CM | POA: Diagnosis not present

## 2015-10-15 DIAGNOSIS — H35352 Cystoid macular degeneration, left eye: Secondary | ICD-10-CM | POA: Diagnosis not present

## 2015-10-15 DIAGNOSIS — H353121 Nonexudative age-related macular degeneration, left eye, early dry stage: Secondary | ICD-10-CM | POA: Diagnosis not present

## 2015-10-15 DIAGNOSIS — H3570 Unspecified separation of retinal layers: Secondary | ICD-10-CM | POA: Diagnosis not present

## 2015-10-15 DIAGNOSIS — H3322 Serous retinal detachment, left eye: Secondary | ICD-10-CM | POA: Diagnosis not present

## 2015-10-28 ENCOUNTER — Other Ambulatory Visit: Payer: Self-pay | Admitting: Interventional Cardiology

## 2015-10-28 ENCOUNTER — Ambulatory Visit (INDEPENDENT_AMBULATORY_CARE_PROVIDER_SITE_OTHER): Payer: Medicare Other | Admitting: Pharmacist

## 2015-10-28 VITALS — BP 187/97 | HR 67

## 2015-10-28 DIAGNOSIS — I10 Essential (primary) hypertension: Secondary | ICD-10-CM | POA: Diagnosis not present

## 2015-10-28 MED ORDER — EPLERENONE 50 MG PO TABS: 50.0000 mg | ORAL_TABLET | Freq: Every day | ORAL | Status: DC

## 2015-10-28 NOTE — Progress Notes (Signed)
Patient ID: Stacy Page                 DOB: 1932-12-21, 82 yo                         MRN: [id number]     HPI: Stacy Page is a 82 y.o. female referred by Lyda Jester, PA to HTN clinic. PMH is significant for HTN, TIA, and HLD. She has multiple medication intolerances and currently takes carvedilol, hydralazine, losartan, and furosemide for her HTN.   Pt reports that her vision is slightly blurry due to her cataracts and that she is having them removed soon. She does not drink any caffeine since her stroke about a year ago. She does have some swelling in her ankles and feet, although this is much better than when she previously took amlodipine. She is stressed due to recent death in the family - her niece passed from stage 4 lung cancer.  Manual check right arm: 214/98, pulse 63.  Rechecked with automatic cuff left arm: 190/94, pulse 67. Given clonidine 0.2mg  in office. Manual recheck right arm: 190/90 Automatic recheck left arm: 187/97  Current HTN meds: carvedilol 6.25mg  BID, furosemide 40mg  daily, hydralazine 100mg  TID, losartan 100mg  daily Previously tried: amlodipine - lower extremity swelling, spironolactone - does not remember reaction, nebivolol and diltiazem - bradycardia, olmesartan - too expensive BP goal: <150/69mmHg  Family History: The patient's family history includes Alzheimer's disease in her brother and sister; Breast cancer in her sister; Heart disease in her father; Hodgkin's lymphoma in her sister; Hypertension in her brother, father, mother, sister, sister, and sister; Hypertension in her mother, father, and grandmother; Stroke in her mother.  Social History: Patient reports that she quit smoking cigarettes and drinking alcohol almost 40 years ago.  Diet: Uses Mrs [employer]. Oatmeal or low sodium bacon, toast and decaf coffee for breakfast. Fruit and graham crackers for lunch. Salad for dinner - she is currently trying to lose 10 lbs.  Exercise: She is planning  to register for the gym soon to start walking on the treadmill there.  Home BP readings: Does not currently check her BP but plans to buy a BP cuff soon.  Wt Readings from Last 3 Encounters:  10/14/15 150 lb (68.04 kg)  09/17/15 148 lb 1.9 oz (67.187 kg)  12/30/14 140 lb (63.504 kg)   BP Readings from Last 3 Encounters:  10/14/15 170/100  09/17/15 182/104  12/30/14 130/73   Pulse Readings from Last 3 Encounters:  10/14/15 72  09/17/15 65  12/30/14 67    Renal function: Estimated Creatinine Clearance: 42.6 mL/min (by C-G formula based on Cr of 0.86).  Past Medical History  Diagnosis Date  . Hypertension   . Hyperlipidemia   . Allergy     seasonal  . Asthma   . GERD (gastroesophageal reflux disease)   . Transient cerebral ischemia     Current Outpatient Prescriptions on File Prior to Visit  Medication Sig Dispense Refill  . albuterol (PROVENTIL HFA;VENTOLIN HFA) 108 (90 Base) MCG/ACT inhaler Inhale 1-2 puffs into the lungs every 4 (four) hours as needed for wheezing or shortness of breath.    . Ascorbic Acid (VITAMIN C) 500 MG tablet Take 500 mg by mouth daily.      Marland Kitchen atorvastatin (LIPITOR) 40 MG tablet Take half tablet (20 mg total) by mouth on Mondays, Wednesdays, Fridays and Sundays.    Marland Kitchen azelastine (ASTELIN) 0.1 % nasal spray Place 2  sprays into both nostrils at bedtime as needed. (allergies)    . Calcium Carbonate (CALTRATE 600 PO) Take 1,200 mg by mouth daily.     . carvedilol (COREG) 6.25 MG tablet Take 1 tablet (6.25 mg total) by mouth 2 (two) times daily. 180 tablet 3  . Cholecalciferol (VITAMIN D3) 2000 UNITS capsule Take 2,000 Units by mouth daily.    . clopidogrel (PLAVIX) 75 MG tablet Take 1 tablet (75 mg total) by mouth daily. 90 tablet 3  . co-enzyme Q-10 30 MG capsule Take 30 mg by mouth daily.     . CVS GARLIC ODORLESS PO Take 1 tablet by mouth daily.    . cycloSPORINE (RESTASIS) 0.05 % ophthalmic emulsion Place 1 drop into both eyes 2 (two) times daily.     Marland Kitchen FEVERFEW PO Take 2 capsules by mouth daily. For vision    . Fish Oil-Cholecalciferol (FISH OIL + D3 PO) Take 2 capsules by mouth daily.    . fluticasone (FLONASE) 50 MCG/ACT nasal spray Place 2 sprays into both nostrils daily as needed for allergies or rhinitis.    . furosemide (LASIX) 40 MG tablet Take 1 tablet by mouth  daily 90 tablet 3  . gabapentin (NEURONTIN) 100 MG capsule Take 100 mg by mouth 3 (three) times daily as needed (leg pain).    Marland Kitchen gabapentin (NEURONTIN) 100 MG capsule TAKE 1 CAPSULE BY MOUTH 3  TIMES DAILY EVERY 8 HOURS  AS NEEDED. 270 capsule 3  . Horse Chestnut 300 MG TABS Take 600 mg by mouth daily.     . hydrALAZINE (APRESOLINE) 100 MG tablet Take 1 tablet (100 mg total) by mouth 3 (three) times daily. 270 tablet 3  . levocetirizine (XYZAL) 5 MG tablet Take 5 mg by mouth daily as needed. (allergies)    . LORazepam (ATIVAN) 0.5 MG tablet Take 1 tablet (0.5 mg total) by mouth daily as needed for anxiety. 30 tablet 0  . losartan (COZAAR) 100 MG tablet Take 1 tablet (100 mg total) by mouth daily. 90 tablet 3  . meclizine (ANTIVERT) 25 MG tablet Take 0.5-1 tablets (12.5-25 mg total) by mouth 3 (three) times daily as needed for dizziness. 45 tablet 3  . montelukast (SINGULAIR) 10 MG tablet Take 10 mg by mouth daily as needed. (allergies)    . pantoprazole (PROTONIX) 20 MG tablet Take 1 tablet (20 mg total) by mouth daily. 90 tablet 3  . potassium chloride (K-DUR,KLOR-CON) 10 MEQ tablet Take 1 tablet (10 mEq total) by mouth 2 (two) times daily. 180 tablet 3   No current facility-administered medications on file prior to visit.    Allergies  Allergen Reactions  . Bystolic [Nebivolol Hcl]     Held by Hospitalist @ 05/22/14 admission due to bradycardia (P 44)  . Aspirin Other (See Comments)    gastritis  . Cardizem [Diltiazem Hcl] Other (See Comments)    bradycardia  . Amlodipine Swelling    LE swelling  . Metoclopramide Hcl Other (See Comments)    UNKNOWN  .  Spironolactone Other (See Comments)    unknown     Assessment/Plan:  1. Hypertension - BP uncontrolled and above goal <150/94mmHg at 214/38mmHg. Rechecked 10 minutes later and down to 190/94. Pt was given clonidine 0.2mg  in clinic. BP check 20 minutes later 187/97. Pt denied headache, blurred vision (slightly blurry at baseline due to cataracts), or dizziness. Discussed with Dr. Irish Lack and ok for pt to leave clinic with sBP of 187. Will start eplerenone 50mg  daily  and continue all other BP medications. Eplerenone should help with ankle swelling and may potentially cut back on K supplementation at future visits. Advised pt to start checking her BP at home. Once she does, would likely add on PRN clonidine for elevated BP. Will likely need dose titration of eplerenone as well. Will check BMET and BP next week.   Megan E. Supple, PharmD, [facility] [id number] [place], [place] [address] Phone: [phone]; Fax: [phone] 10/28/2015 11:06 AM

## 2015-10-28 NOTE — Patient Instructions (Signed)
Pick up your prescription for eplerenone 50mg  to start taking once daily. Continue all of your other blood pressure medications. Start checking your blood pressure at home and write down your readings to bring with you to your next visit. We will recheck your blood pressure and labs again next week on Friday, March 17th at [facility] in blood pressure clinic with any questions [phone].

## 2015-10-31 ENCOUNTER — Encounter: Payer: Medicare Other | Admitting: Internal Medicine

## 2015-11-03 ENCOUNTER — Telehealth: Payer: Self-pay | Admitting: Internal Medicine

## 2015-11-03 NOTE — Telephone Encounter (Signed)
Pt would like to transfer from [place] to [place].  Is this ok with both of you?

## 2015-11-03 NOTE — Telephone Encounter (Signed)
Fine with me

## 2015-11-04 NOTE — Telephone Encounter (Signed)
I can accept her in the late summer of fall, but not at this time

## 2015-11-07 ENCOUNTER — Ambulatory Visit (INDEPENDENT_AMBULATORY_CARE_PROVIDER_SITE_OTHER): Payer: Medicare Other | Admitting: Nurse Practitioner

## 2015-11-07 ENCOUNTER — Ambulatory Visit: Payer: Medicare Other | Admitting: Pharmacist

## 2015-11-07 ENCOUNTER — Ambulatory Visit (INDEPENDENT_AMBULATORY_CARE_PROVIDER_SITE_OTHER): Payer: Medicare Other | Admitting: Pharmacist

## 2015-11-07 ENCOUNTER — Encounter: Payer: Self-pay | Admitting: Nurse Practitioner

## 2015-11-07 ENCOUNTER — Other Ambulatory Visit: Payer: Self-pay

## 2015-11-07 ENCOUNTER — Encounter: Payer: Self-pay | Admitting: Pharmacist

## 2015-11-07 VITALS — BP 190/102 | HR 74

## 2015-11-07 VITALS — BP 142/90 | HR 74 | Temp 97.9°F | Ht <= 58 in | Wt 148.5 lb

## 2015-11-07 DIAGNOSIS — I10 Essential (primary) hypertension: Secondary | ICD-10-CM

## 2015-11-07 MED ORDER — CHLORTHALIDONE 50 MG PO TABS: 50.0000 mg | ORAL_TABLET | Freq: Every day | ORAL | Status: DC

## 2015-11-07 MED ORDER — LORAZEPAM 0.5 MG PO TABS: 0.5000 mg | ORAL_TABLET | Freq: Every day | ORAL | Status: DC | PRN

## 2015-11-07 NOTE — Patient Instructions (Addendum)
Start taking 2 of your eplerenone tablets every day. Pick up your prescription for chlorthalidone 50mg  to start taking once a day. We will see you in a week for another blood pressure check. Please bring in your medicines with you.

## 2015-11-07 NOTE — Progress Notes (Signed)
Patient ID: Stacy Page, female    DOB: 1933-04-11  Age: 82 y.o. MRN: [id number]  CC: Hypertension   HPI Stacy Page presents for CC of HTN.   1) Saw cardiology this morning Improved BP since this morning  Lost a niece 2 weeks ago to lung cancer and has been upset Lost last prescription of lorazepam and hasn't had control of anxiety   History Stacy Page has a past medical history of Hypertension; Hyperlipidemia; Allergy; Asthma; GERD (gastroesophageal reflux disease); and Transient cerebral ischemia.   She has past surgical history that includes Bunionectomy (Right); Carpal tunnel release; Abdominal hysterectomy; Macular Degeneration; Colonoscopy ([id number]); and Umbilical hernia repair.   Her family history includes Alzheimer's disease in her mother; Heart attack in her father; Heart attack (age of onset: 42) in her maternal grandmother; Hypertension in her father, maternal grandmother, and mother; Stroke (age of onset: 65) in her mother. There is no history of Colon cancer, Esophageal cancer, Pancreatic cancer, Kidney disease, or Liver disease.She reports that she quit smoking about 36 years ago. Her smoking use included Cigarettes. She has never used smokeless tobacco. She reports that she does not drink alcohol or use illicit drugs.  Outpatient Prescriptions Prior to Visit  Medication Sig Dispense Refill  . albuterol (PROVENTIL HFA;VENTOLIN HFA) 108 (90 Base) MCG/ACT inhaler Inhale 1-2 puffs into the lungs every 4 (four) hours as needed for wheezing or shortness of breath.    . Ascorbic Acid (VITAMIN C) 500 MG tablet Take 500 mg by mouth daily.      Marland Kitchen atorvastatin (LIPITOR) 40 MG tablet Take half tablet (20 mg total) by mouth on Mondays, Wednesdays, Fridays and Sundays.    Marland Kitchen azelastine (ASTELIN) 0.1 % nasal spray Place 2 sprays into both nostrils at bedtime as needed. (allergies)    . Calcium Carbonate (CALTRATE 600 PO) Take 1,200 mg by mouth daily.     . carvedilol (COREG) 6.25 MG tablet  Take 1 tablet (6.25 mg total) by mouth 2 (two) times daily. 180 tablet 3  . chlorthalidone (HYGROTON) 50 MG tablet Take 1 tablet (50 mg total) by mouth daily. 30 tablet 11  . Cholecalciferol (VITAMIN D3) 2000 UNITS capsule Take 2,000 Units by mouth daily.    . clopidogrel (PLAVIX) 75 MG tablet Take 1 tablet (75 mg total) by mouth daily. 90 tablet 3  . co-enzyme Q-10 30 MG capsule Take 30 mg by mouth daily.     . CVS GARLIC ODORLESS PO Take 1 tablet by mouth daily.    . cycloSPORINE (RESTASIS) 0.05 % ophthalmic emulsion Place 1 drop into both eyes 2 (two) times daily.    Marland Kitchen eplerenone (INSPRA) 50 MG tablet Take 2 tablets (100 mg total) by mouth daily. 30 tablet 1  . FEVERFEW PO Take 2 capsules by mouth daily. For vision    . Fish Oil-Cholecalciferol (FISH OIL + D3 PO) Take 2 capsules by mouth daily.    . fluticasone (FLONASE) 50 MCG/ACT nasal spray Place 2 sprays into both nostrils daily as needed for allergies or rhinitis.    . furosemide (LASIX) 40 MG tablet Take 1 tablet by mouth  daily 90 tablet 3  . gabapentin (NEURONTIN) 100 MG capsule TAKE 1 CAPSULE BY MOUTH 3  TIMES DAILY EVERY 8 HOURS  AS NEEDED. 270 capsule 3  . Horse Chestnut 300 MG TABS Take 600 mg by mouth daily.     . hydrALAZINE (APRESOLINE) 100 MG tablet Take 1 tablet (100 mg total) by mouth 3 (three) times daily.  270 tablet 3  . levocetirizine (XYZAL) 5 MG tablet Take 5 mg by mouth daily as needed. (allergies)    . losartan (COZAAR) 100 MG tablet Take 1 tablet (100 mg total) by mouth daily. 90 tablet 3  . meclizine (ANTIVERT) 25 MG tablet Take 0.5-1 tablets (12.5-25 mg total) by mouth 3 (three) times daily as needed for dizziness. 45 tablet 3  . montelukast (SINGULAIR) 10 MG tablet Take 10 mg by mouth daily as needed. (allergies)    . pantoprazole (PROTONIX) 20 MG tablet Take 1 tablet (20 mg total) by mouth daily. 90 tablet 3  . potassium chloride (K-DUR,KLOR-CON) 10 MEQ tablet Take 1 tablet (10 mEq total) by mouth 2 (two) times  daily. 180 tablet 3  . LORazepam (ATIVAN) 0.5 MG tablet Take 1 tablet (0.5 mg total) by mouth daily as needed for anxiety. 30 tablet 0   No facility-administered medications prior to visit.    ROS Review of Systems  Constitutional: Negative for fever, chills, diaphoresis and fatigue.  Respiratory: Negative for chest tightness, shortness of breath and wheezing.   Cardiovascular: Negative for chest pain, palpitations and leg swelling.  Gastrointestinal: Negative for nausea, vomiting and diarrhea.  Skin: Negative for rash.  Neurological: Negative for dizziness, weakness, numbness and headaches.  Psychiatric/Behavioral: The patient is nervous/anxious.     Objective:  BP 142/90 mmHg  Pulse 74  Temp(Src) 97.9 F (36.6 C) (Oral)  Ht 4\' 10"  (1.473 m)  Wt 148 lb 8 oz (67.359 kg)  BMI 31.04 kg/m2  SpO2 97%  Physical Exam  Constitutional: She is oriented to person, place, and time. She appears well-developed and well-nourished. No distress.  HENT:  Head: Normocephalic and atraumatic.  Right Ear: External ear normal.  Left Ear: External ear normal.  Cardiovascular: Normal rate, regular rhythm and normal heart sounds.   Pulmonary/Chest: Effort normal and breath sounds normal. No respiratory distress. She has no wheezes. She has no rales. She exhibits no tenderness.  Neurological: She is alert and oriented to person, place, and time.  Skin: Skin is warm and dry. No rash noted. She is not diaphoretic.  Psychiatric: She has a normal mood and affect. Her behavior is normal. Judgment and thought content normal.   Assessment & Plan:   Stacy Page was seen today for hypertension.  Diagnoses and all orders for this visit:  Accelerated hypertension  Other orders -     LORazepam (ATIVAN) 0.5 MG tablet; Take 1 tablet (0.5 mg total) by mouth daily as needed for anxiety.  I am having Stacy Page maintain her co-enzyme Q-10, Horse Chestnut, vitamin C, Calcium Carbonate (CALTRATE 600 PO),  cycloSPORINE, Fish Oil-Cholecalciferol (FISH OIL + D3 PO), FEVERFEW PO, Vitamin D3, CVS GARLIC ODORLESS PO, potassium chloride, clopidogrel, pantoprazole, meclizine, atorvastatin, fluticasone, albuterol, montelukast, azelastine, levocetirizine, losartan, carvedilol, gabapentin, furosemide, hydrALAZINE, chlorthalidone, eplerenone, and LORazepam.  Meds ordered this encounter  Medications  . LORazepam (ATIVAN) 0.5 MG tablet    Sig: Take 1 tablet (0.5 mg total) by mouth daily as needed for anxiety.    Dispense:  30 tablet    Refill:  0    Order Specific Question:  Supervising Provider    Answer:  Crecencio Mc [[id number]]     Follow-up: Return if symptoms worsen or fail to improve.

## 2015-11-07 NOTE — Telephone Encounter (Signed)
Pt is in to see Morey Hummingbird today. Requested a refill of her Lorazepam.

## 2015-11-07 NOTE — Patient Instructions (Signed)
Good to see you! Your blood pressure has improved. Try the 1/2 tablet of lorazepam for anxiety.  Have a great weekend.

## 2015-11-07 NOTE — Progress Notes (Signed)
Patient ID: Stacy Page                 DOB: 07-12-1933, 82 yo                         MRN: [id number]     HPI: Stacy Page is a 82 y.o. female who presents to HTN clinic for follow up. PMH is significant for HTN, TIA, and HLD. She has multiple medication intolerances and currently takes carvedilol, hydralazine, losartan, furosemide, and eplerenone which was added at last visit for her HTN.   She does not drink any caffeine since her stroke about a year ago. She does have some swelling in her ankles and feet, although this is much better than when she previously took amlodipine. She is stressed due to recent death in the family - her niece passed from stage 4 lung cancer.  Had BP checked twice since she was seen last week - 199/90, 179/80  Current HTN meds: carvedilol 6.25mg  BID, furosemide 40mg  daily, hydralazine 100mg  TID, losartan 100mg  daily, eplerenone 50mg  daily Previously tried: amlodipine - lower extremity swelling, spironolactone - does not remember reaction, nebivolol and diltiazem - bradycardia, olmesartan - too expensive BP goal: <150/58mmHg  Family History: The patient's family history includes Alzheimer's disease in her brother and sister; Breast cancer in her sister; Heart disease in her father; Hodgkin's lymphoma in her sister; Hypertension in her brother, father, mother, sister, sister, and sister; Hypertension in her mother, father, and grandmother; Stroke in her mother.  Social History: Patient reports that she quit smoking cigarettes and drinking alcohol almost 40 years ago.  Diet: Uses Mrs Deliah Boston. Oatmeal or low sodium bacon, toast and decaf coffee for breakfast. Fruit and graham crackers for lunch. Salad for dinner - she is currently trying to lose 10 lbs.  Exercise: She is planning to register for the gym soon to start walking on the treadmill there.  Home BP readings: Does not currently check her BP but plans to buy a BP cuff soon.  Wt Readings from Last 3  Encounters:  10/14/15 150 lb (68.04 kg)  09/17/15 148 lb 1.9 oz (67.187 kg)  12/30/14 140 lb (63.504 kg)   BP Readings from Last 3 Encounters:  10/28/15 187/97  10/14/15 170/100  09/17/15 182/104   Pulse Readings from Last 3 Encounters:  10/28/15 67  10/14/15 72  09/17/15 65    Renal function: CrCl cannot be calculated (Unknown ideal weight.).  Past Medical History  Diagnosis Date  . Hypertension   . Hyperlipidemia   . Allergy     seasonal  . Asthma   . GERD (gastroesophageal reflux disease)   . Transient cerebral ischemia     Current Outpatient Prescriptions on File Prior to Visit  Medication Sig Dispense Refill  . albuterol (PROVENTIL HFA;VENTOLIN HFA) 108 (90 Base) MCG/ACT inhaler Inhale 1-2 puffs into the lungs every 4 (four) hours as needed for wheezing or shortness of breath.    . Ascorbic Acid (VITAMIN C) 500 MG tablet Take 500 mg by mouth daily.      Marland Kitchen atorvastatin (LIPITOR) 40 MG tablet Take half tablet (20 mg total) by mouth on Mondays, Wednesdays, Fridays and Sundays.    Marland Kitchen azelastine (ASTELIN) 0.1 % nasal spray Place 2 sprays into both nostrils at bedtime as needed. (allergies)    . Calcium Carbonate (CALTRATE 600 PO) Take 1,200 mg by mouth daily.     . carvedilol (COREG) 6.25 MG tablet Take  1 tablet (6.25 mg total) by mouth 2 (two) times daily. 180 tablet 3  . Cholecalciferol (VITAMIN D3) 2000 UNITS capsule Take 2,000 Units by mouth daily.    . clopidogrel (PLAVIX) 75 MG tablet Take 1 tablet (75 mg total) by mouth daily. 90 tablet 3  . co-enzyme Q-10 30 MG capsule Take 30 mg by mouth daily.     . CVS GARLIC ODORLESS PO Take 1 tablet by mouth daily.    . cycloSPORINE (RESTASIS) 0.05 % ophthalmic emulsion Place 1 drop into both eyes 2 (two) times daily.    Marland Kitchen eplerenone (INSPRA) 50 MG tablet Take 1 tablet (50 mg total) by mouth daily. 30 tablet 1  . FEVERFEW PO Take 2 capsules by mouth daily. For vision    . Fish Oil-Cholecalciferol (FISH OIL + D3 PO) Take 2  capsules by mouth daily.    . fluticasone (FLONASE) 50 MCG/ACT nasal spray Place 2 sprays into both nostrils daily as needed for allergies or rhinitis.    . furosemide (LASIX) 40 MG tablet Take 1 tablet by mouth  daily 90 tablet 3  . gabapentin (NEURONTIN) 100 MG capsule TAKE 1 CAPSULE BY MOUTH 3  TIMES DAILY EVERY 8 HOURS  AS NEEDED. 270 capsule 3  . Horse Chestnut 300 MG TABS Take 600 mg by mouth daily.     . hydrALAZINE (APRESOLINE) 100 MG tablet Take 1 tablet (100 mg total) by mouth 3 (three) times daily. 270 tablet 3  . levocetirizine (XYZAL) 5 MG tablet Take 5 mg by mouth daily as needed. (allergies)    . LORazepam (ATIVAN) 0.5 MG tablet Take 1 tablet (0.5 mg total) by mouth daily as needed for anxiety. 30 tablet 0  . losartan (COZAAR) 100 MG tablet Take 1 tablet (100 mg total) by mouth daily. 90 tablet 3  . meclizine (ANTIVERT) 25 MG tablet Take 0.5-1 tablets (12.5-25 mg total) by mouth 3 (three) times daily as needed for dizziness. 45 tablet 3  . montelukast (SINGULAIR) 10 MG tablet Take 10 mg by mouth daily as needed. (allergies)    . pantoprazole (PROTONIX) 20 MG tablet Take 1 tablet (20 mg total) by mouth daily. 90 tablet 3  . potassium chloride (K-DUR,KLOR-CON) 10 MEQ tablet Take 1 tablet (10 mEq total) by mouth 2 (two) times daily. 180 tablet 3   No current facility-administered medications on file prior to visit.    Allergies  Allergen Reactions  . Bystolic [Nebivolol Hcl]     Held by Hospitalist @ 05/22/14 admission due to bradycardia (P 44)  . Aspirin Other (See Comments)    gastritis  . Cardizem [Diltiazem Hcl] Other (See Comments)    bradycardia  . Amlodipine Swelling    LE swelling  . Metoclopramide Hcl Other (See Comments)    UNKNOWN  . Spironolactone Other (See Comments)    unknown     Assessment/Plan:  1. Hypertension - BP still far above goal <150/59mmHg at 190/1100mmHg, although this is an improvement from 214/98 at last check. Will increase eplerenone to  100mg  daily and start chlorthalidone 50mg  daily. Checking BMET today with addition of eplerenone last week. Will f/u in HTN clinic in 1 week and recheck BMET at that time. Advised pt to bring in her medicines with her.   Megan E. Supple, PharmD, [facility] [id number], [place], [place] [address] Phone: [phone]; Fax: [phone] 11/07/2015 11:02 AM

## 2015-11-08 LAB — BASIC METABOLIC PANEL
BUN: 16 mg/dL (ref 7–25)
CALCIUM: 9.3 mg/dL (ref 8.6–10.4)
CO2: 26 mmol/L (ref 20–31)
CREATININE: 0.82 mg/dL (ref 0.60–0.88)
Chloride: 105 mmol/L (ref 98–110)
GLUCOSE: 85 mg/dL (ref 65–99)
Potassium: 3.7 mmol/L (ref 3.5–5.3)
Sodium: 140 mmol/L (ref 135–146)

## 2015-11-10 DIAGNOSIS — H353221 Exudative age-related macular degeneration, left eye, with active choroidal neovascularization: Secondary | ICD-10-CM | POA: Diagnosis not present

## 2015-11-10 DIAGNOSIS — H353212 Exudative age-related macular degeneration, right eye, with inactive choroidal neovascularization: Secondary | ICD-10-CM | POA: Diagnosis not present

## 2015-11-14 ENCOUNTER — Ambulatory Visit: Payer: Medicare Other | Admitting: Pharmacist

## 2015-11-14 ENCOUNTER — Ambulatory Visit (INDEPENDENT_AMBULATORY_CARE_PROVIDER_SITE_OTHER): Payer: Medicare Other | Admitting: Pharmacist

## 2015-11-14 VITALS — BP 136/70 | HR 72

## 2015-11-14 DIAGNOSIS — I10 Essential (primary) hypertension: Secondary | ICD-10-CM

## 2015-11-14 LAB — BASIC METABOLIC PANEL
BUN: 31 mg/dL — AB (ref 7–25)
CALCIUM: 9.2 mg/dL (ref 8.6–10.4)
CO2: 25 mmol/L (ref 20–31)
Chloride: 102 mmol/L (ref 98–110)
Creat: 1.15 mg/dL — ABNORMAL HIGH (ref 0.60–0.88)
GLUCOSE: 80 mg/dL (ref 65–99)
Potassium: 4 mmol/L (ref 3.5–5.3)
SODIUM: 136 mmol/L (ref 135–146)

## 2015-11-14 NOTE — Assessment & Plan Note (Signed)
BP Readings from Last 3 Encounters:  11/07/15 142/90  11/07/15 190/102  10/28/15 187/97   Stable currently (due to age don't want too low especially since she was so high earlier). No changes Lorazepam script given with permission from Dr. Sharlet Salina.

## 2015-11-17 ENCOUNTER — Other Ambulatory Visit: Payer: Self-pay | Admitting: *Deleted

## 2015-11-17 MED ORDER — EPLERENONE 50 MG PO TABS: 100.0000 mg | ORAL_TABLET | Freq: Every day | ORAL | Status: DC

## 2015-11-27 ENCOUNTER — Ambulatory Visit (INDEPENDENT_AMBULATORY_CARE_PROVIDER_SITE_OTHER): Payer: Medicare Other | Admitting: Pharmacist

## 2015-11-27 VITALS — BP 132/76 | HR 64

## 2015-11-27 DIAGNOSIS — I10 Essential (primary) hypertension: Secondary | ICD-10-CM | POA: Diagnosis not present

## 2015-11-27 LAB — BASIC METABOLIC PANEL
BUN: 19 mg/dL (ref 7–25)
CHLORIDE: 99 mmol/L (ref 98–110)
CO2: 25 mmol/L (ref 20–31)
Calcium: 9.4 mg/dL (ref 8.6–10.4)
Creat: 1.09 mg/dL — ABNORMAL HIGH (ref 0.60–0.88)
GLUCOSE: 84 mg/dL (ref 65–99)
POTASSIUM: 4.1 mmol/L (ref 3.5–5.3)
SODIUM: 133 mmol/L — AB (ref 135–146)

## 2015-11-27 NOTE — Progress Notes (Signed)
Patient ID: Stacy Page                 DOB: 06-09-1933, 82 yo                         MRN: [id number]     HPI: Stacy Page is a 82 y.o. female who presents to HTN clinic for follow up. PMH is significant for HTN, TIA, and HLD. She has multiple medication intolerances and currently takes carvedilol, hydralazine, losartan, furosemide, and eplerenone.  She brought her medication bottles into the clinic today.  She is off with her 3- month refills by ~1 month so not sure if this is due to lack of compliance or multiple medication changes that have occurred recently.   Pt reports feeling well since last visit.  No complaints of dizziness, orthostatic hypotension, etc.  She checked her BP once at home and said it was good.   Current HTN meds: carvedilol 6.25mg  BID, furosemide 40mg  daily, hydralazine 100mg  TID, losartan 100mg  daily, eplerenone 50mg  daily Previously tried: amlodipine - lower extremity swelling, spironolactone - does not remember reaction, nebivolol and diltiazem - bradycardia, olmesartan - too expensive BP goal: <150/107mmHg  Family History: The patient's family history includes Alzheimer's disease in her brother and sister; Breast cancer in her sister; Heart disease in her father; Hodgkin's lymphoma in her sister; Hypertension in her brother, father, mother, sister, sister, and sister; Hypertension in her mother, father, and grandmother; Stroke in her mother.  Social History: Patient reports that she quit smoking cigarettes and drinking alcohol almost 40 years ago.  Diet: Uses Mrs Deliah Boston. Oatmeal or low sodium bacon, toast and decaf coffee for breakfast. Fruit and graham crackers for lunch. Salad for dinner - she is currently trying to lose 10 lbs.  Exercise: She is planning to register for the gym soon to start walking on the treadmill there.    Wt Readings from Last 3 Encounters:  11/07/15 148 lb 8 oz (67.359 kg)  10/14/15 150 lb (68.04 kg)  09/17/15 148 lb 1.9 oz (67.187  kg)   BP Readings from Last 3 Encounters:  11/27/15 132/76  11/14/15 136/70  11/07/15 142/90   Pulse Readings from Last 3 Encounters:  11/27/15 64  11/14/15 72  11/07/15 74    Renal function: CrCl cannot be calculated (Unknown ideal weight.).  Past Medical History  Diagnosis Date  . Hypertension   . Hyperlipidemia   . Allergy     seasonal  . Asthma   . GERD (gastroesophageal reflux disease)   . Transient cerebral ischemia     Current Outpatient Prescriptions on File Prior to Visit  Medication Sig Dispense Refill  . carvedilol (COREG) 6.25 MG tablet Take 1 tablet (6.25 mg total) by mouth 2 (two) times daily. 180 tablet 3  . chlorthalidone (HYGROTON) 50 MG tablet Take 1 tablet (50 mg total) by mouth daily. 30 tablet 11  . eplerenone (INSPRA) 50 MG tablet Take 2 tablets (100 mg total) by mouth daily. 60 tablet 11  . hydrALAZINE (APRESOLINE) 100 MG tablet Take 1 tablet (100 mg total) by mouth 3 (three) times daily. 270 tablet 3  . losartan (COZAAR) 100 MG tablet Take 1 tablet (100 mg total) by mouth daily. 90 tablet 3  . pantoprazole (PROTONIX) 20 MG tablet Take 1 tablet (20 mg total) by mouth daily. 90 tablet 3  . albuterol (PROVENTIL HFA;VENTOLIN HFA) 108 (90 Base) MCG/ACT inhaler Inhale 1-2 puffs into the lungs every  4 (four) hours as needed for wheezing or shortness of breath.    . Ascorbic Acid (VITAMIN C) 500 MG tablet Take 500 mg by mouth daily.      Marland Kitchen atorvastatin (LIPITOR) 40 MG tablet Take half tablet (20 mg total) by mouth on Mondays, Wednesdays, Fridays and Sundays.    Marland Kitchen azelastine (ASTELIN) 0.1 % nasal spray Place 2 sprays into both nostrils at bedtime as needed. (allergies)    . Calcium Carbonate (CALTRATE 600 PO) Take 1,200 mg by mouth daily.     . Cholecalciferol (VITAMIN D3) 2000 UNITS capsule Take 2,000 Units by mouth daily.    . clopidogrel (PLAVIX) 75 MG tablet Take 1 tablet (75 mg total) by mouth daily. 90 tablet 3  . co-enzyme Q-10 30 MG capsule Take 30 mg  by mouth daily.     . CVS GARLIC ODORLESS PO Take 1 tablet by mouth daily.    . cycloSPORINE (RESTASIS) 0.05 % ophthalmic emulsion Place 1 drop into both eyes 2 (two) times daily.    Marland Kitchen FEVERFEW PO Take 2 capsules by mouth daily. For vision    . Fish Oil-Cholecalciferol (FISH OIL + D3 PO) Take 2 capsules by mouth daily.    . fluticasone (FLONASE) 50 MCG/ACT nasal spray Place 2 sprays into both nostrils daily as needed for allergies or rhinitis.    . furosemide (LASIX) 40 MG tablet Take 1 tablet by mouth  daily 90 tablet 3  . gabapentin (NEURONTIN) 100 MG capsule TAKE 1 CAPSULE BY MOUTH 3  TIMES DAILY EVERY 8 HOURS  AS NEEDED. 270 capsule 3  . Horse Chestnut 300 MG TABS Take 600 mg by mouth daily.     Marland Kitchen levocetirizine (XYZAL) 5 MG tablet Take 5 mg by mouth daily as needed. (allergies)    . LORazepam (ATIVAN) 0.5 MG tablet Take 1 tablet (0.5 mg total) by mouth daily as needed for anxiety. 30 tablet 0  . meclizine (ANTIVERT) 25 MG tablet Take 0.5-1 tablets (12.5-25 mg total) by mouth 3 (three) times daily as needed for dizziness. 45 tablet 3  . montelukast (SINGULAIR) 10 MG tablet Take 10 mg by mouth daily as needed. (allergies)    . potassium chloride (K-DUR,KLOR-CON) 10 MEQ tablet Take 1 tablet (10 mEq total) by mouth 2 (two) times daily. 180 tablet 3   No current facility-administered medications on file prior to visit.    Allergies  Allergen Reactions  . Bystolic [Nebivolol Hcl]     Held by Hospitalist @ 05/22/14 admission due to bradycardia (P 44)  . Aspirin Other (See Comments)    gastritis  . Cardizem [Diltiazem Hcl] Other (See Comments)    bradycardia  . Amlodipine Swelling    LE swelling  . Metoclopramide Hcl Other (See Comments)    UNKNOWN  . Spironolactone Other (See Comments)    unknown     Assessment/Plan:  1. Hypertension - BP at goal x 2 visits with addition of eplerenone.  Her SCr increased slightly at her last BMET check.  Will follow up today.  If stable, will have  her continue current medications and follow up with Dr. Tamala Julian as previously planned.  If it has continued to increase, will have to consider decrease of eplereonone, chlorthalidone or losartan doses.   Elberta Leatherwood, PharmD, BCPS, [id number], [place], [place] [address] Phone: [phone]; Fax: [phone] 11/27/2015 1:18 PM

## 2015-11-27 NOTE — Progress Notes (Signed)
Pt's BP checked on 3/24 by Elbert Ewings, RN.   HTN medications verified and BMET drawn.    BP was at goal.  BMET normal.  There was a slight increase in SCr.  Pt notified on 3/27 and appt made for repeat BP check and BMET in 2 weeks.

## 2015-12-22 DIAGNOSIS — H353221 Exudative age-related macular degeneration, left eye, with active choroidal neovascularization: Secondary | ICD-10-CM | POA: Diagnosis not present

## 2015-12-28 DIAGNOSIS — J3089 Other allergic rhinitis: Secondary | ICD-10-CM | POA: Diagnosis not present

## 2016-01-05 ENCOUNTER — Ambulatory Visit: Payer: Medicare Other | Admitting: Family

## 2016-01-05 DIAGNOSIS — Z0289 Encounter for other administrative examinations: Secondary | ICD-10-CM

## 2016-01-08 ENCOUNTER — Ambulatory Visit (INDEPENDENT_AMBULATORY_CARE_PROVIDER_SITE_OTHER): Payer: Medicare Other | Admitting: Family

## 2016-01-08 ENCOUNTER — Encounter: Payer: Self-pay | Admitting: Family

## 2016-01-08 VITALS — BP 120/78 | HR 69 | Temp 98.4°F | Ht <= 58 in | Wt 147.4 lb

## 2016-01-08 DIAGNOSIS — J309 Allergic rhinitis, unspecified: Secondary | ICD-10-CM | POA: Diagnosis not present

## 2016-01-08 MED ORDER — GUAIFENESIN ER 600 MG PO TB12: 1200.0000 mg | ORAL_TABLET | Freq: Two times a day (BID) | ORAL | Status: DC | PRN

## 2016-01-08 NOTE — Patient Instructions (Signed)
Suspect seasonal allergies.   Increase intake of clear fluids. Congestion is best treated by hydration, when mucus is wetter, it is thinner, less sticky, and easier to expel from the body, either through coughing up drainage, or by blowing your nose.   Get plenty of rest.   Use saline nasal drops and blow your nose frequently. Run a humidifier at night and elevate the head of the bed. Vicks Vapor rub will help with congestion and cough. Steam showers and sinus massage for congestion.   Use Acetaminophen or Ibuprofen as needed for fever or pain. Avoid second hand smoke. Even the smallest exposure will worsen symptoms.   Over the counter medications you can try include Delsym for cough, a decongestant for congestion, and Mucinex or Robitussin as an expectorant. Be sure to just get the plain Mucinex or Robitussin that just has one medication (Guaifenesen). We don't recommend the combination products. Note, be sure to drink two glasses of water with each dose of Mucinex as the medication will not work well without adequate hydration.   You can also try a teaspoon of honey to see if this will help reduce cough. Throat lozenges can sometimes be beneficial as well.    This illness will typically last 7 - 10 days.   Please follow up with our clinic if you develop a fever greater than 101 F, symptoms worsen, or do not resolve in the next week.

## 2016-01-08 NOTE — Progress Notes (Signed)
Subjective:    Patient ID: Stacy Page, female    DOB: Apr 11, 1933, 82 y.o.   MRN: [id number]   Stacy Page is a 82 y.o. female who presents today for an acute visit.    HPI Comments: Sinus tenderness has resolved. H/o vertigo worse with 'since sinus problem.' Worse when bending forward. Takes meclizine with some relief, though doesn't work as well when has sinus infection. Tinnitus. No hearing loss. Hasnt passed out or fallen.   H/o asthma, seasonal allergies.   Sinusitis This is a new problem. The current episode started 1 to 4 weeks ago. The problem has been gradually improving since onset. There has been no fever. She is experiencing no pain. Associated symptoms include congestion and coughing. Pertinent negatives include no chills, ear pain, headaches, shortness of breath, sinus pressure (resolved) or sore throat. Past treatments include spray decongestants (antihistamine). The treatment provided no relief.   Past Medical History  Diagnosis Date  . Hypertension   . Hyperlipidemia   . Allergy     seasonal  . Asthma   . GERD (gastroesophageal reflux disease)   . Transient cerebral ischemia    Allergies: Bystolic; Aspirin; Cardizem; Amlodipine; Metoclopramide hcl; and Spironolactone Current Outpatient Prescriptions on File Prior to Visit  Medication Sig Dispense Refill  . albuterol (PROVENTIL HFA;VENTOLIN HFA) 108 (90 Base) MCG/ACT inhaler Inhale 1-2 puffs into the lungs every 4 (four) hours as needed for wheezing or shortness of breath.    . Ascorbic Acid (VITAMIN C) 500 MG tablet Take 500 mg by mouth daily.      Marland Kitchen atorvastatin (LIPITOR) 40 MG tablet Take half tablet (20 mg total) by mouth on Mondays, Wednesdays, Fridays and Sundays.    Marland Kitchen azelastine (ASTELIN) 0.1 % nasal spray Place 2 sprays into both nostrils at bedtime as needed. (allergies)    . Calcium Carbonate (CALTRATE 600 PO) Take 1,200 mg by mouth daily.     . carvedilol (COREG) 6.25 MG tablet Take 1 tablet (6.25 mg  total) by mouth 2 (two) times daily. 180 tablet 3  . chlorthalidone (HYGROTON) 50 MG tablet Take 1 tablet (50 mg total) by mouth daily. 30 tablet 11  . Cholecalciferol (VITAMIN D3) 2000 UNITS capsule Take 2,000 Units by mouth daily.    . clopidogrel (PLAVIX) 75 MG tablet Take 1 tablet (75 mg total) by mouth daily. 90 tablet 3  . co-enzyme Q-10 30 MG capsule Take 30 mg by mouth daily.     . CVS GARLIC ODORLESS PO Take 1 tablet by mouth daily.    . cycloSPORINE (RESTASIS) 0.05 % ophthalmic emulsion Place 1 drop into both eyes 2 (two) times daily.    Marland Kitchen eplerenone (INSPRA) 50 MG tablet Take 2 tablets (100 mg total) by mouth daily. 60 tablet 11  . FEVERFEW PO Take 2 capsules by mouth daily. For vision    . Fish Oil-Cholecalciferol (FISH OIL + D3 PO) Take 2 capsules by mouth daily.    . fluticasone (FLONASE) 50 MCG/ACT nasal spray Place 2 sprays into both nostrils daily as needed for allergies or rhinitis.    . furosemide (LASIX) 40 MG tablet Take 1 tablet by mouth  daily 90 tablet 3  . gabapentin (NEURONTIN) 100 MG capsule TAKE 1 CAPSULE BY MOUTH 3  TIMES DAILY EVERY 8 HOURS  AS NEEDED. 270 capsule 3  . Horse Chestnut 300 MG TABS Take 600 mg by mouth daily.     . hydrALAZINE (APRESOLINE) 100 MG tablet Take 1 tablet (100 mg  total) by mouth 3 (three) times daily. 270 tablet 3  . levocetirizine (XYZAL) 5 MG tablet Take 5 mg by mouth daily as needed. (allergies)    . LORazepam (ATIVAN) 0.5 MG tablet Take 1 tablet (0.5 mg total) by mouth daily as needed for anxiety. 30 tablet 0  . losartan (COZAAR) 100 MG tablet Take 1 tablet (100 mg total) by mouth daily. 90 tablet 3  . meclizine (ANTIVERT) 25 MG tablet Take 0.5-1 tablets (12.5-25 mg total) by mouth 3 (three) times daily as needed for dizziness. 45 tablet 3  . montelukast (SINGULAIR) 10 MG tablet Take 10 mg by mouth daily as needed. (allergies)    . pantoprazole (PROTONIX) 20 MG tablet Take 1 tablet (20 mg total) by mouth daily. 90 tablet 3  . potassium  chloride (K-DUR,KLOR-CON) 10 MEQ tablet Take 1 tablet (10 mEq total) by mouth 2 (two) times daily. 180 tablet 3   No current facility-administered medications on file prior to visit.    Social History  Substance Use Topics  . Smoking status: Former Smoker    Types: Cigarettes    Quit date: 08/24/1979  . Smokeless tobacco: Never Used     Comment: smoked  1966-1981, up to 1 ppd  . Alcohol Use: No    Review of Systems  Constitutional: Negative for fever and chills.  HENT: Positive for congestion and postnasal drip. Negative for ear pain, sinus pressure (resolved) and sore throat.   Respiratory: Positive for cough. Negative for shortness of breath and wheezing.   Cardiovascular: Negative for chest pain and palpitations.  Gastrointestinal: Positive for nausea. Negative for vomiting.  Musculoskeletal: Negative for myalgias.  Neurological: Negative for dizziness and headaches.      Objective:    BP 120/78 mmHg  Pulse 69  Temp(Src) 98.4 F (36.9 C) (Oral)  Ht 4\' 10"  (1.473 m)  Wt 147 lb 6 oz (66.849 kg)  BMI 30.81 kg/m2  SpO2 97%   Physical Exam  Constitutional: She appears well-developed and well-nourished.  HENT:  Head: Normocephalic and atraumatic.  Right Ear: Hearing, tympanic membrane, external ear and ear canal normal. No drainage, swelling or tenderness. No foreign bodies. Tympanic membrane is not erythematous and not bulging. No middle ear effusion. No decreased hearing is noted.  Left Ear: Hearing, tympanic membrane, external ear and ear canal normal. No drainage, swelling or tenderness. No foreign bodies. Tympanic membrane is not erythematous and not bulging.  No middle ear effusion. No decreased hearing is noted.  Nose: Rhinorrhea present. Right sinus exhibits no maxillary sinus tenderness and no frontal sinus tenderness. Left sinus exhibits no maxillary sinus tenderness and no frontal sinus tenderness.  Mouth/Throat: Uvula is midline and mucous membranes are normal.  Posterior oropharyngeal erythema present. No oropharyngeal exudate, posterior oropharyngeal edema or tonsillar abscesses.  Eyes: Conjunctivae are normal.  Cardiovascular: Regular rhythm, normal heart sounds and normal pulses.   Pulmonary/Chest: Effort normal and breath sounds normal. She has no wheezes. She has no rhonchi. She has no rales.  Lymphadenopathy:       Head (right side): No submental, no submandibular, no tonsillar, no preauricular, no posterior auricular and no occipital adenopathy present.       Head (left side): No submental, no submandibular, no tonsillar, no preauricular, no posterior auricular and no occipital adenopathy present.    She has no cervical adenopathy.  Neurological: She is alert.  Skin: Skin is warm and dry.  Psychiatric: She has a normal mood and affect. Her speech is normal and  behavior is normal. Thought content normal.  Vitals reviewed.      Assessment & Plan:   1. Allergic rhinitis, unspecified allergic rhinitis type Suspect seasonal allergies as cause of postnasal drip, and congestion. No evidence of bacterial infection at this time. I'm also reassured that the sinus pressure/tenderness has resolved. Patient and I agreed on conservative treatment at this time with delayed use of antibiotics.  - guaiFENesin (MUCINEX) 600 MG 12 hr tablet; Take 2 tablets (1,200 mg total) by mouth 2 (two) times daily as needed for cough or to loosen phlegm.  Dispense: 60 tablet; Refill: 1    I am having Ms. Nawrot maintain her co-enzyme Q-10, Horse Chestnut, vitamin C, Calcium Carbonate (CALTRATE 600 PO), cycloSPORINE, Fish Oil-Cholecalciferol (FISH OIL + D3 PO), FEVERFEW PO, Vitamin D3, CVS GARLIC ODORLESS PO, potassium chloride, clopidogrel, pantoprazole, meclizine, atorvastatin, fluticasone, albuterol, montelukast, azelastine, levocetirizine, losartan, carvedilol, gabapentin, furosemide, hydrALAZINE, chlorthalidone, LORazepam, eplerenone, and ipratropium.   Meds ordered  this encounter  Medications  . ipratropium (ATROVENT) 0.06 % nasal spray    Sig: USE 2 PUFFS IN THE NOSTRIL BID PRF NASAL CONGESTION AND DRAINAGE    Refill:  0     Start medications as prescribed and explained to patient on After Visit Summary ( AVS). Risks, benefits, and alternatives of the medications and treatment plan prescribed today were discussed, and patient expressed understanding.   Education regarding symptom management and diagnosis given to patient.   Follow-up:Plan follow-up as discussed or as needed if any worsening symptoms or change in condition.   Continue to follow with Hoyt Koch, MD for routine health maintenance.   Stacy Page and I agreed with plan.   Mable Paris, FNP

## 2016-01-08 NOTE — Progress Notes (Signed)
Pre visit review using our clinic review tool, if applicable. No additional management support is needed unless otherwise documented below in the visit note. 

## 2016-01-15 DIAGNOSIS — J301 Allergic rhinitis due to pollen: Secondary | ICD-10-CM | POA: Diagnosis not present

## 2016-01-15 DIAGNOSIS — J0100 Acute maxillary sinusitis, unspecified: Secondary | ICD-10-CM | POA: Diagnosis not present

## 2016-01-15 DIAGNOSIS — J3089 Other allergic rhinitis: Secondary | ICD-10-CM | POA: Diagnosis not present

## 2016-01-15 DIAGNOSIS — J4520 Mild intermittent asthma, uncomplicated: Secondary | ICD-10-CM | POA: Diagnosis not present

## 2016-01-20 ENCOUNTER — Ambulatory Visit: Payer: Self-pay | Admitting: Allergy and Immunology

## 2016-01-21 DIAGNOSIS — H353121 Nonexudative age-related macular degeneration, left eye, early dry stage: Secondary | ICD-10-CM | POA: Diagnosis not present

## 2016-01-21 DIAGNOSIS — H353221 Exudative age-related macular degeneration, left eye, with active choroidal neovascularization: Secondary | ICD-10-CM | POA: Diagnosis not present

## 2016-01-30 ENCOUNTER — Encounter (HOSPITAL_COMMUNITY): Payer: Self-pay | Admitting: Emergency Medicine

## 2016-01-30 ENCOUNTER — Ambulatory Visit (INDEPENDENT_AMBULATORY_CARE_PROVIDER_SITE_OTHER): Payer: Medicare Other | Admitting: Internal Medicine

## 2016-01-30 ENCOUNTER — Emergency Department (HOSPITAL_COMMUNITY): Payer: Medicare Other

## 2016-01-30 ENCOUNTER — Other Ambulatory Visit (INDEPENDENT_AMBULATORY_CARE_PROVIDER_SITE_OTHER): Payer: Medicare Other

## 2016-01-30 ENCOUNTER — Observation Stay (HOSPITAL_COMMUNITY)
Admission: EM | Admit: 2016-01-30 | Discharge: 2016-02-01 | Disposition: A | Discharge status: Home / Self Care | Payer: Medicare Other | Attending: Internal Medicine | Admitting: Internal Medicine

## 2016-01-30 ENCOUNTER — Encounter: Payer: Self-pay | Admitting: Internal Medicine

## 2016-01-30 VITALS — BP 104/82 | HR 55 | Temp 98.3°F | Resp 12 | Ht <= 58 in | Wt 137.8 lb

## 2016-01-30 DIAGNOSIS — I10 Essential (primary) hypertension: Secondary | ICD-10-CM | POA: Diagnosis present

## 2016-01-30 DIAGNOSIS — D649 Anemia, unspecified: Secondary | ICD-10-CM | POA: Diagnosis not present

## 2016-01-30 DIAGNOSIS — R5383 Other fatigue: Secondary | ICD-10-CM | POA: Diagnosis present

## 2016-01-30 DIAGNOSIS — Z7902 Long term (current) use of antithrombotics/antiplatelets: Secondary | ICD-10-CM | POA: Insufficient documentation

## 2016-01-30 DIAGNOSIS — F419 Anxiety disorder, unspecified: Secondary | ICD-10-CM | POA: Insufficient documentation

## 2016-01-30 DIAGNOSIS — R110 Nausea: Secondary | ICD-10-CM | POA: Diagnosis present

## 2016-01-30 DIAGNOSIS — R634 Abnormal weight loss: Secondary | ICD-10-CM | POA: Insufficient documentation

## 2016-01-30 DIAGNOSIS — E782 Mixed hyperlipidemia: Secondary | ICD-10-CM | POA: Diagnosis present

## 2016-01-30 DIAGNOSIS — R001 Bradycardia, unspecified: Secondary | ICD-10-CM | POA: Insufficient documentation

## 2016-01-30 DIAGNOSIS — E871 Hypo-osmolality and hyponatremia: Secondary | ICD-10-CM | POA: Diagnosis not present

## 2016-01-30 DIAGNOSIS — N390 Urinary tract infection, site not specified: Secondary | ICD-10-CM | POA: Diagnosis not present

## 2016-01-30 DIAGNOSIS — K219 Gastro-esophageal reflux disease without esophagitis: Secondary | ICD-10-CM | POA: Diagnosis present

## 2016-01-30 DIAGNOSIS — Z7951 Long term (current) use of inhaled steroids: Secondary | ICD-10-CM | POA: Insufficient documentation

## 2016-01-30 DIAGNOSIS — E785 Hyperlipidemia, unspecified: Secondary | ICD-10-CM | POA: Insufficient documentation

## 2016-01-30 DIAGNOSIS — Z6829 Body mass index (BMI) 29.0-29.9, adult: Secondary | ICD-10-CM | POA: Diagnosis not present

## 2016-01-30 DIAGNOSIS — J45909 Unspecified asthma, uncomplicated: Secondary | ICD-10-CM | POA: Diagnosis present

## 2016-01-30 DIAGNOSIS — E860 Dehydration: Secondary | ICD-10-CM | POA: Diagnosis present

## 2016-01-30 DIAGNOSIS — Z87891 Personal history of nicotine dependence: Secondary | ICD-10-CM | POA: Insufficient documentation

## 2016-01-30 DIAGNOSIS — R8271 Bacteriuria: Secondary | ICD-10-CM | POA: Diagnosis not present

## 2016-01-30 DIAGNOSIS — N179 Acute kidney failure, unspecified: Principal | ICD-10-CM | POA: Diagnosis present

## 2016-01-30 DIAGNOSIS — E876 Hypokalemia: Secondary | ICD-10-CM | POA: Diagnosis not present

## 2016-01-30 DIAGNOSIS — Z79899 Other long term (current) drug therapy: Secondary | ICD-10-CM | POA: Insufficient documentation

## 2016-01-30 LAB — COMPREHENSIVE METABOLIC PANEL
ALBUMIN: 4.1 g/dL (ref 3.5–5.0)
ALK PHOS: 89 U/L (ref 39–117)
ALK PHOS: 85 U/L (ref 38–126)
ALT: 10 U/L (ref 0–35)
ALT: 14 U/L (ref 14–54)
ANION GAP: 13 (ref 5–15)
AST: 12 U/L (ref 0–37)
AST: 16 U/L (ref 15–41)
Albumin: 4.1 g/dL (ref 3.5–5.2)
BILIRUBIN TOTAL: 0.5 mg/dL (ref 0.2–1.2)
BILIRUBIN TOTAL: 0.9 mg/dL (ref 0.3–1.2)
BUN: 46 mg/dL — ABNORMAL HIGH (ref 6–23)
BUN: 45 mg/dL — AB (ref 6–20)
CALCIUM: 9.1 mg/dL (ref 8.9–10.3)
CO2: 26 mEq/L (ref 19–32)
CO2: 20 mmol/L — ABNORMAL LOW (ref 22–32)
Calcium: 9.4 mg/dL (ref 8.4–10.5)
Chloride: 94 mEq/L — ABNORMAL LOW (ref 96–112)
Chloride: 95 mmol/L — ABNORMAL LOW (ref 101–111)
Creatinine, Ser: 1.97 mg/dL — ABNORMAL HIGH (ref 0.40–1.20)
Creatinine, Ser: 1.77 mg/dL — ABNORMAL HIGH (ref 0.44–1.00)
GFR calc Af Amer: 30 mL/min — ABNORMAL LOW (ref >60)
GFR calc non Af Amer: 26 mL/min — ABNORMAL LOW (ref >60)
GFR: 31.3 mL/min — AB (ref >60.00)
GLUCOSE: 100 mg/dL — AB (ref 70–99)
GLUCOSE: 99 mg/dL (ref 65–99)
POTASSIUM: 3.4 meq/L — AB (ref 3.5–5.1)
Potassium: 3.3 mmol/L — ABNORMAL LOW (ref 3.5–5.1)
Sodium: 129 mEq/L — ABNORMAL LOW (ref 135–145)
Sodium: 128 mmol/L — ABNORMAL LOW (ref 135–145)
TOTAL PROTEIN: 7.5 g/dL (ref 6.0–8.3)
TOTAL PROTEIN: 7.3 g/dL (ref 6.5–8.1)

## 2016-01-30 LAB — CBC WITH DIFFERENTIAL/PLATELET
BASOS PCT: 0 %
Basophils Absolute: 0 10*3/uL (ref 0.0–0.1)
Eosinophils Absolute: 0 10*3/uL (ref 0.0–0.7)
Eosinophils Relative: 1 %
HEMATOCRIT: 32.2 % — AB (ref 36.0–46.0)
HEMOGLOBIN: 11.2 g/dL — AB (ref 12.0–15.0)
LYMPHS ABS: 1.2 10*3/uL (ref 0.7–4.0)
LYMPHS PCT: 25 %
MCH: 28.3 pg (ref 26.0–34.0)
MCHC: 34.8 g/dL (ref 30.0–36.0)
MCV: 81.3 fL (ref 78.0–100.0)
MONOS PCT: 9 %
Monocytes Absolute: 0.4 10*3/uL (ref 0.1–1.0)
NEUTROS ABS: 3.2 10*3/uL (ref 1.7–7.7)
NEUTROS PCT: 65 %
Platelets: 283 10*3/uL (ref 150–400)
RBC: 3.96 MIL/uL (ref 3.87–5.11)
RDW: 13.8 % (ref 11.5–15.5)
WBC: 4.8 10*3/uL (ref 4.0–10.5)

## 2016-01-30 LAB — CBC
HEMATOCRIT: 34.7 % — AB (ref 36.0–46.0)
HEMOGLOBIN: 11.4 g/dL — AB (ref 12.0–15.0)
MCHC: 32.9 g/dL (ref 30.0–36.0)
MCV: 85.1 fl (ref 78.0–100.0)
PLATELETS: 276 10*3/uL (ref 150.0–400.0)
RBC: 4.07 Mil/uL (ref 3.87–5.11)
RDW: 14.1 % (ref 11.5–15.5)
WBC: 4.7 10*3/uL (ref 4.0–10.5)

## 2016-01-30 LAB — URINE MICROSCOPIC-ADD ON

## 2016-01-30 LAB — URINALYSIS, ROUTINE W REFLEX MICROSCOPIC
Bilirubin Urine: NEGATIVE
Glucose, UA: NEGATIVE mg/dL
Hgb urine dipstick: NEGATIVE
Ketones, ur: NEGATIVE mg/dL
NITRITE: NEGATIVE
PH: 5 (ref 5.0–8.0)
Protein, ur: NEGATIVE mg/dL
SPECIFIC GRAVITY, URINE: 1.012 (ref 1.005–1.030)

## 2016-01-30 LAB — T4, FREE: FREE T4: 1.09 ng/dL (ref 0.60–1.60)

## 2016-01-30 LAB — TSH: TSH: 0.98 u[IU]/mL (ref 0.35–4.50)

## 2016-01-30 LAB — LACTIC ACID, PLASMA: LACTIC ACID, VENOUS: 0.9 mmol/L (ref 0.5–2.0)

## 2016-01-30 LAB — LIPASE, BLOOD: Lipase: 21 U/L (ref 11–51)

## 2016-01-30 LAB — MAGNESIUM: Magnesium: 2.4 mg/dL (ref 1.7–2.4)

## 2016-01-30 LAB — LIPASE: Lipase: 27 U/L (ref 11.0–59.0)

## 2016-01-30 LAB — TROPONIN I: Troponin I: <0.03 ng/mL (ref <0.031)

## 2016-01-30 MED ORDER — HYDROCODONE-ACETAMINOPHEN 5-325 MG PO TABS: 1.0000 | ORAL_TABLET | ORAL | Status: DC | PRN

## 2016-01-30 MED ORDER — VITAMIN D 1000 UNITS PO TABS
2000.0000 [IU] | ORAL_TABLET | Freq: Every day | ORAL | Status: DC
  Administered 2016-01-31 – 2016-02-01 (×2): 2000 [IU] via ORAL
  Filled 2016-01-30 (×2): qty 2

## 2016-01-30 MED ORDER — DEXTROSE 5 % IV SOLN
1.0000 g | Freq: Once | INTRAVENOUS | Status: AC
  Administered 2016-01-30: 1 g via INTRAVENOUS
  Filled 2016-01-30: qty 10

## 2016-01-30 MED ORDER — ACETAMINOPHEN 650 MG RE SUPP: 650.0000 mg | Freq: Four times a day (QID) | RECTAL | Status: DC | PRN

## 2016-01-30 MED ORDER — HYDRALAZINE HCL 50 MG PO TABS
100.0000 mg | ORAL_TABLET | Freq: Three times a day (TID) | ORAL | Status: DC
  Administered 2016-01-31 (×3): 100 mg via ORAL
  Filled 2016-01-30 (×4): qty 2

## 2016-01-30 MED ORDER — PANTOPRAZOLE SODIUM 20 MG PO TBEC
20.0000 mg | DELAYED_RELEASE_TABLET | Freq: Every day | ORAL | Status: DC
  Administered 2016-01-31 – 2016-02-01 (×2): 20 mg via ORAL
  Filled 2016-01-30 (×2): qty 1

## 2016-01-30 MED ORDER — CALCIUM CARBONATE 1250 (500 CA) MG PO TABS
2500.0000 mg | ORAL_TABLET | Freq: Every day | ORAL | Status: DC
  Administered 2016-01-31 – 2016-02-01 (×2): 2500 mg via ORAL
  Filled 2016-01-30 (×2): qty 2

## 2016-01-30 MED ORDER — ONDANSETRON HCL 4 MG/2ML IJ SOLN
4.0000 mg | Freq: Four times a day (QID) | INTRAMUSCULAR | Status: DC | PRN
Start: 2016-01-30 — End: 2016-02-01

## 2016-01-30 MED ORDER — CYCLOSPORINE 0.05 % OP EMUL
1.0000 [drp] | Freq: Two times a day (BID) | OPHTHALMIC | Status: DC
  Administered 2016-01-31 – 2016-02-01 (×4): 1 [drp] via OPHTHALMIC
  Filled 2016-01-30 (×6): qty 1

## 2016-01-30 MED ORDER — SODIUM CHLORIDE 0.9% FLUSH
3.0000 mL | Freq: Two times a day (BID) | INTRAVENOUS | Status: DC
  Administered 2016-01-31: 3 mL via INTRAVENOUS

## 2016-01-30 MED ORDER — SODIUM CHLORIDE 0.9 % IV BOLUS (SEPSIS)
1000.0000 mL | Freq: Once | INTRAVENOUS | Status: AC
  Administered 2016-01-30: 1000 mL via INTRAVENOUS

## 2016-01-30 MED ORDER — CETYLPYRIDINIUM CHLORIDE 0.05 % MT LIQD: 7.0000 mL | Freq: Two times a day (BID) | OROMUCOSAL | Status: DC

## 2016-01-30 MED ORDER — GABAPENTIN 100 MG PO CAPS
100.0000 mg | ORAL_CAPSULE | Freq: Three times a day (TID) | ORAL | Status: DC
  Administered 2016-01-31 – 2016-02-01 (×5): 100 mg via ORAL
  Filled 2016-01-30 (×5): qty 1

## 2016-01-30 MED ORDER — POTASSIUM CHLORIDE IN NACL 40-0.9 MEQ/L-% IV SOLN
INTRAVENOUS | Status: DC
  Administered 2016-01-30: 100 mL/h via INTRAVENOUS
  Filled 2016-01-30 (×2): qty 1000

## 2016-01-30 MED ORDER — CHLORHEXIDINE GLUCONATE 0.12 % MT SOLN
15.0000 mL | Freq: Two times a day (BID) | OROMUCOSAL | Status: DC
  Administered 2016-01-31 – 2016-02-01 (×4): 15 mL via OROMUCOSAL
  Filled 2016-01-30 (×4): qty 15

## 2016-01-30 MED ORDER — CLOPIDOGREL BISULFATE 75 MG PO TABS
75.0000 mg | ORAL_TABLET | Freq: Every day | ORAL | Status: DC
  Administered 2016-01-31 – 2016-02-01 (×2): 75 mg via ORAL
  Filled 2016-01-30 (×2): qty 1

## 2016-01-30 MED ORDER — ATORVASTATIN CALCIUM 40 MG PO TABS
40.0000 mg | ORAL_TABLET | Freq: Every day | ORAL | Status: DC
  Administered 2016-01-31: 40 mg via ORAL
  Filled 2016-01-30: qty 1

## 2016-01-30 MED ORDER — ACETAMINOPHEN 325 MG PO TABS: 650.0000 mg | ORAL_TABLET | Freq: Four times a day (QID) | ORAL | Status: DC | PRN

## 2016-01-30 MED ORDER — ALBUTEROL SULFATE HFA 108 (90 BASE) MCG/ACT IN AERS: 1.0000 | INHALATION_SPRAY | RESPIRATORY_TRACT | Status: DC | PRN

## 2016-01-30 MED ORDER — FEVERFEW 380 MG PO CAPS: 1.0000 | ORAL_CAPSULE | Freq: Every day | ORAL | Status: DC

## 2016-01-30 MED ORDER — PANTOPRAZOLE SODIUM 40 MG PO TBEC: 40.0000 mg | DELAYED_RELEASE_TABLET | Freq: Every day | ORAL | Status: DC

## 2016-01-30 MED ORDER — CARVEDILOL 6.25 MG PO TABS
6.2500 mg | ORAL_TABLET | Freq: Two times a day (BID) | ORAL | Status: DC
  Administered 2016-01-31: 6.25 mg via ORAL
  Filled 2016-01-30 (×2): qty 1

## 2016-01-30 MED ORDER — AZELASTINE HCL 0.1 % NA SOLN
2.0000 | Freq: Every evening | NASAL | Status: DC | PRN
  Filled 2016-01-30: qty 30

## 2016-01-30 MED ORDER — ENOXAPARIN SODIUM 30 MG/0.3ML ~~LOC~~ SOLN
30.0000 mg | SUBCUTANEOUS | Status: DC
  Administered 2016-01-31 (×2): 30 mg via SUBCUTANEOUS
  Filled 2016-01-30 (×2): qty 0.3

## 2016-01-30 MED ORDER — PROMETHAZINE HCL 12.5 MG PO TABS: 12.5000 mg | ORAL_TABLET | Freq: Three times a day (TID) | ORAL | Status: DC | PRN

## 2016-01-30 MED ORDER — ONDANSETRON HCL 4 MG/2ML IJ SOLN
4.0000 mg | Freq: Once | INTRAMUSCULAR | Status: AC
  Administered 2016-01-30: 4 mg via INTRAVENOUS
  Filled 2016-01-30: qty 2

## 2016-01-30 MED ORDER — LORAZEPAM 0.5 MG PO TABS
0.5000 mg | ORAL_TABLET | Freq: Every day | ORAL | Status: DC | PRN
Start: 2016-01-30 — End: 2016-02-01

## 2016-01-30 MED ORDER — ONDANSETRON HCL 4 MG PO TABS: 4.0000 mg | ORAL_TABLET | Freq: Four times a day (QID) | ORAL | Status: DC | PRN

## 2016-01-30 NOTE — ED Notes (Signed)
Pt placed on bedpan

## 2016-01-30 NOTE — ED Notes (Signed)
Per pt continued nausea and fatigue for a month; seen by PCP who suggest try to eat/drink with promethazine; came here instead.

## 2016-01-30 NOTE — H&P (Addendum)
History and Physical    Stacy Page [id number] DOB: 1933/01/14 DOA: 01/30/2016  PCP: Hoyt Koch, MD   Patient coming from: Home   Chief Complaint: Nausea, fatigue, loss of appetite, wt loss   HPI: Stacy Page is a 82 y.o. female with medical history significant for hypertension, hyperlipidemia, and GERD who presents to the ED with nausea, fatigue, loss of appetite, and unintentional weight loss. Patient reports pain in her usual state of health until just over one month ago when she suffered acute bacterial sinusitis which was treated with antibiotics. As the sinusitis resolved, patient developed nausea and poor appetite. Nausea and loss of appetite and persisted for the past month and the patient has lost 15 pounds over this interval. She is becoming increasingly fatigued and complains of malaise. She reports chills, but denies fevers. She denies abdominal pain, vomiting, or diarrhea. She denies melena or hematochezia, and denies lower abdominal pain, dysuria, or hematuria. Patient was evaluated by her primary care physician for these complaints and prescribed promethazine. She hadn't yet tried this medication before coming to the emergency department for evaluation.  ED Course: Upon arrival to the ED, patient is found to be afebrile, saturating well on room air, heart rate in the 50s, and vitals otherwise stable. EKG features a sinus rhythm with low voltage QRS and chest x-ray and KUB are both negative for acute pathology. Chemistry panel features widespread derangements including sodium of 128, potassium 3.3, BUN 45, and serum creatinine 1.77, up from 1.092 months prior. CBC features a normocytic anemia with hemoglobin of 11.2. Urinalysis is notable for few bacteria, moderate leukocytes, negative nitrites, and 6-30 WBC. Urine was sent for culture and the patient was treated empirically with Rocephin. A 1 L bolus of normal saline was administered and symptomatic care was provided  with Zofran. Patient has remained hemodynamically stable in the emergency department and will be observed in the hospital for ongoing evaluation and management of nausea and poor appetite with dehydration, electrolyte derangements, and acute kidney injury.  Review of Systems:  All other systems reviewed and apart from HPI, are negative.  Past Medical History  Diagnosis Date  . Hypertension   . Hyperlipidemia   . Allergy     seasonal  . Asthma   . GERD (gastroesophageal reflux disease)   . Transient cerebral ischemia     Past Surgical History  Procedure Laterality Date  . Bunionectomy Right   . Carpal tunnel release      Bilaterally  . Abdominal hysterectomy      for fibroids (no BSO)  . Macular degeneration      Dr Zadie Rhine  ; injections  . Colonoscopy  2011    tiny polyp ; [facility]  . Umbilical hernia repair       reports that she quit smoking about 36 years ago. Her smoking use included Cigarettes. She has never used smokeless tobacco. She reports that she does not drink alcohol or use illicit drugs.  Allergies  Allergen Reactions  . Bystolic [Nebivolol Hcl]     Held by Hospitalist @ 05/22/14 admission due to bradycardia (P 44)  . Aspirin Other (See Comments)    gastritis  . Cardizem [Diltiazem Hcl] Other (See Comments)    bradycardia  . Amlodipine Swelling    LE swelling  . Metoclopramide Hcl Other (See Comments)    UNKNOWN  . Spironolactone Other (See Comments)    unknown    Family History  Problem Relation Age of Onset  .  Alzheimer's disease Mother   . Hypertension Mother   . Stroke Mother 15  . Hypertension Father   . Heart attack Father     in 46's  . Hypertension Maternal Grandmother   . Heart attack Maternal Grandmother 73  . Diabetes      1/2 sister  . Colon cancer Neg Hx   . Esophageal cancer Neg Hx   . Pancreatic cancer Neg Hx   . Kidney disease Neg Hx   . Liver disease Neg Hx      Prior to Admission medications   Medication Sig Start  Date End Date Taking? Authorizing Provider  albuterol (PROVENTIL HFA;VENTOLIN HFA) 108 (90 Base) MCG/ACT inhaler Inhale 1-2 puffs into the lungs every 4 (four) hours as needed for wheezing or shortness of breath.   Yes Historical Provider, MD  Ascorbic Acid (VITAMIN C) 500 MG tablet Take 500 mg by mouth daily.     Yes Historical Provider, MD  atorvastatin (LIPITOR) 40 MG tablet Take half tablet (20 mg total) by mouth on Mondays, Wednesdays, Fridays and Sundays.   Yes Historical Provider, MD  azelastine (ASTELIN) 0.1 % nasal spray Place 2 sprays into both nostrils at bedtime as needed. (allergies) 07/22/15  Yes Historical Provider, MD  Calcium Carbonate (CALTRATE 600 PO) Take 1,200 mg by mouth daily.    Yes Historical Provider, MD  carvedilol (COREG) 6.25 MG tablet Take 1 tablet (6.25 mg total) by mouth 2 (two) times daily. 09/17/15  Yes Belva Crome, MD  chlorthalidone (HYGROTON) 50 MG tablet Take 1 tablet (50 mg total) by mouth daily. 11/07/15  Yes Belva Crome, MD  Cholecalciferol (VITAMIN D3) 2000 UNITS capsule Take 2,000 Units by mouth daily.   Yes Historical Provider, MD  clopidogrel (PLAVIX) 75 MG tablet Take 1 tablet (75 mg total) by mouth daily. 03/24/15  Yes Hoyt Koch, MD  co-enzyme Q-10 30 MG capsule Take 30 mg by mouth daily.    Yes Historical Provider, MD  [employer]S GARLIC ODORLESS PO Take 1 tablet by mouth daily.   Yes Historical Provider, MD  cycloSPORINE (RESTASIS) 0.05 % ophthalmic emulsion Place 1 drop into both eyes 2 (two) times daily.   Yes Historical Provider, MD  eplerenone (INSPRA) 50 MG tablet Take 2 tablets (100 mg total) by mouth daily. 11/17/15  Yes Belva Crome, MD  FEVERFEW PO Take 2 capsules by mouth daily. For vision   Yes Historical Provider, MD  Fish Oil-Cholecalciferol (FISH OIL + D3 PO) Take 2 capsules by mouth daily.   Yes Historical Provider, MD  fluticasone (FLONASE) 50 MCG/ACT nasal spray Place 2 sprays into both nostrils daily as needed for allergies or  rhinitis.   Yes Historical Provider, MD  gabapentin (NEURONTIN) 100 MG capsule TAKE 1 CAPSULE BY MOUTH 3  TIMES DAILY EVERY 8 HOURS  AS NEEDED. 10/07/15  Yes Hoyt Koch, MD  Horse Chestnut 300 MG TABS Take 600 mg by mouth daily.    Yes Historical Provider, MD  hydrALAZINE (APRESOLINE) 100 MG tablet Take 1 tablet (100 mg total) by mouth 3 (three) times daily. 10/14/15  Yes Brittainy M Simmons, PA-C  ipratropium (ATROVENT) 0.06 % nasal spray USE 2 PUFFS IN THE NOSTRIL BID PRF NASAL CONGESTION AND DRAINAGE 12/28/15  Yes Historical Provider, MD  levocetirizine (XYZAL) 5 MG tablet Take 5 mg by mouth daily as needed. (allergies) 07/22/15  Yes Historical Provider, MD  LORazepam (ATIVAN) 0.5 MG tablet Take 1 tablet (0.5 mg total) by mouth daily as  needed for anxiety. 11/07/15  Yes Rubbie Battiest, NP  losartan (COZAAR) 100 MG tablet Take 1 tablet (100 mg total) by mouth daily. 09/17/15  Yes Belva Crome, MD  meclizine (ANTIVERT) 25 MG tablet Take 0.5-1 tablets (12.5-25 mg total) by mouth 3 (three) times daily as needed for dizziness. 04/03/15  Yes Hoyt Koch, MD  montelukast (SINGULAIR) 10 MG tablet Take 10 mg by mouth daily as needed. (allergies) 07/22/15  Yes Historical Provider, MD  pantoprazole (PROTONIX) 20 MG tablet Take 1 tablet (20 mg total) by mouth daily. 03/24/15  Yes Hoyt Koch, MD  potassium chloride (K-DUR,KLOR-CON) 10 MEQ tablet Take 1 tablet (10 mEq total) by mouth 2 (two) times daily. 03/24/15  Yes Hoyt Koch, MD  promethazine (PHENERGAN) 12.5 MG tablet Take 1 tablet (12.5 mg total) by mouth every 8 (eight) hours as needed for nausea or vomiting. 01/30/16  Yes Hoyt Koch, MD  furosemide (LASIX) 40 MG tablet Take 1 tablet by mouth  daily Patient not taking: Reported on 01/30/2016 10/07/15   Belva Crome, MD  guaiFENesin (MUCINEX) 600 MG 12 hr tablet Take 2 tablets (1,200 mg total) by mouth 2 (two) times daily as needed for cough or to loosen phlegm. Patient  not taking: Reported on 01/30/2016 01/08/16   Burnard Hawthorne, FNP    Physical Exam: Filed Vitals:   01/30/16 1637 01/30/16 2113  BP: 102/70 138/73  Pulse: 55 66  Temp: 98.2 F (36.8 C) 98.3 F (36.8 C)  TempSrc: Oral Oral  Resp: 16 20  Height: 4\' 10"  (1.473 m)   Weight: 61.236 kg (135 lb)   SpO2: 98% 97%      Constitutional: NAD, calm, in apparent discomfort Eyes: PERTLA, lids and conjunctivae normal ENMT: Mucous membranes are dry. Posterior pharynx clear of any exudate or lesions.   Neck: normal, supple, no masses, no thyromegaly Respiratory: clear to auscultation bilaterally, no wheezing, no crackles. Normal respiratory effort.   Cardiovascular: S1 & S2 heard, regular rate and rhythm. No carotid bruits. No significant JVD. Abdomen: No distension, no tenderness, no masses palpated. Bowel sounds normal.  Musculoskeletal: no clubbing / cyanosis. No joint deformity upper and lower extremities. Normal muscle tone.  Skin: no significant rashes, lesions, ulcers. Warm, dry, well-perfused. Neurologic: CN 2-12 grossly intact. Sensation intact, DTR normal. Strength 5/5 in all 4 limbs.  Psychiatric: Normal judgment and insight. Alert and oriented x 3. Normal mood and affect.     Labs on Admission: I have personally reviewed following labs and imaging studies  CBC:  Recent Labs Lab 01/30/16 1557 01/30/16 1710  WBC 4.7 4.8  NEUTROABS  --  3.2  HGB 11.4* 11.2*  HCT 34.7* 32.2*  MCV 85.1 81.3  PLT 276.0 [id number]   Basic Metabolic Panel:  Recent Labs Lab 01/30/16 1557 01/30/16 1710  NA 129* 128*  K 3.4* 3.3*  CL 94* 95*  CO2 26 20*  GLUCOSE 100* 99  BUN 46* 45*  CREATININE 1.97* 1.77*  CALCIUM 9.4 9.1   GFR: Estimated Creatinine Clearance: 19.6 mL/min (by C-G formula based on Cr of 1.77). Liver Function Tests:  Recent Labs Lab 01/30/16 1557 01/30/16 1710  AST 12 16  ALT 10 14  ALKPHOS 89 85  BILITOT 0.5 0.9  PROT 7.5 7.3  ALBUMIN 4.1 4.1    Recent Labs Lab  01/30/16 1557 01/30/16 1710  LIPASE 27.0 21   No results for input(s): AMMONIA in the last 168 hours. Coagulation Profile: No results  for input(s): INR, PROTIME in the last 168 hours. Cardiac Enzymes: No results for input(s): CKTOTAL, CKMB, CKMBINDEX, TROPONINI in the last 168 hours. BNP (last 3 results) No results for input(s): PROBNP in the last 8760 hours. HbA1C: No results for input(s): HGBA1C in the last 72 hours. CBG: No results for input(s): GLUCAP in the last 168 hours. Lipid Profile: No results for input(s): CHOL, HDL, LDLCALC, TRIG, CHOLHDL, LDLDIRECT in the last 72 hours. Thyroid Function Tests:  Recent Labs  01/30/16 1557  TSH 0.98  FREET4 1.09   Anemia Panel: No results for input(s): VITAMINB12, FOLATE, FERRITIN, TIBC, IRON, RETICCTPCT in the last 72 hours. Urine analysis:    Component Value Date/Time   COLORURINE YELLOW 01/30/2016 1938   APPEARANCEUR CLOUDY* 01/30/2016 1938   LABSPEC 1.012 01/30/2016 1938   PHURINE 5.0 01/30/2016 1938   GLUCOSEU NEGATIVE 01/30/2016 1938   HGBUR NEGATIVE 01/30/2016 1938   BILIRUBINUR NEGATIVE 01/30/2016 1938   KETONESUR NEGATIVE 01/30/2016 1938   PROTEINUR NEGATIVE 01/30/2016 1938   UROBILINOGEN 0.2 05/29/2014 1753   NITRITE NEGATIVE 01/30/2016 1938   LEUKOCYTESUR MODERATE* 01/30/2016 1938   Sepsis Labs: @LABRCNTIP (procalcitonin:4,lacticidven:4) )No results found for this or any previous visit (from the past 240 hour(s)).   Radiological Exams on Admission: Dg Abd Acute W/chest  01/30/2016  CLINICAL DATA:  Nausea and fatigue for 1 month. EXAM: DG ABDOMEN ACUTE W/ 1V CHEST COMPARISON:  May 29, 2014 chest x-ray FINDINGS: There is no evidence of dilated bowel loops or free intraperitoneal air. No radiopaque calculi or other significant radiographic abnormality is seen. Heart size and mediastinal contours are within normal limits. Both lungs are clear. IMPRESSION: Negative abdominal radiographs.  No acute cardiopulmonary  disease. Electronically Signed   By: Dorise Bullion III M.D   On: 01/30/2016 20:19    EKG: Independently reviewed. Sinus rhythm, low-voltage QRS, early R-wave transition  Assessment/Plan  1. Nausea, loss of appetite, wt loss  - There has been a 15 lb wt loss in past 1-2 mos  - Suspect the nausea and poor appetite first developed when she was ill with acute sinusitis, possibly secondary to, or exacerbated by abx use, and now persists in the setting of AKI and electrolyte derangements  - Plan to treat AKI, hyponatremia, hypokalemia as below and, if fails to improve with these measures, may need to extend the work-up   2. AKI  - SCr 1.77 on admission, up from 1.09 in April, 0.82 in March  - Suspect a prerenal azotemia in setting of poor PO intake, likely exacerbated by use of eplerenone and losartan   - Continue IVF hydration, avoid nephrotoxins, hold eplerenone and losartan, check urine studies, repeat chem panel in am   3. Hyponatremia, hypokalemia  - Serum sodium 128 and potassium 3.3 in the setting of dehydration and chlorthalidone use  - 1 L normal saline bolused in ED, continuing IVF with NS+K at 100 cc/hr  - Hold chlorthalidone  - Repeat chem panel in am    4. Hypertension  - At goal at time of admission  - Managed with hydralazine, Coreg, losartan, chlorthalidone, eplerenone at home  - Holding losartan, chlorthalidone, and eplerenone as discussed above  - Continue hydralazine, continue Coreg with holding parameters    5. Hyperlipidemia  - Continue current-management with Liptor    6. GERD - No EGD on file to determine presence of esophagitis  - Managed with daily Protonix 20 mg at home, will continue    7. Anxiety  - Stable, managed  with prn Ativan at home, will continue   8. Asymptomatic bacteruria  - Received an empiric dose Rocephin in ED  - Culture is incubating  - No symptoms present, will hold off on further treatment for now, follow-up culture   DVT  prophylaxis: sq Lovenox Code Status: Full  Family Communication: Sister and daughter updated at bedside  Disposition Plan: Observe on telemetry  Consults called: None Admission status: Observation     Vianne Bulls, MD [facility] Pager [phone]  If 7PM-7AM, please contact night-coverage [email or web address] Password [facility]  01/30/2016, 9:49 PM

## 2016-01-30 NOTE — ED Provider Notes (Signed)
CSN: [id number]     Arrival date & time 01/30/16  1608 History   First MD Initiated Contact with Patient 01/30/16 1921     Chief Complaint  Patient presents with  . Fatigue  . Nausea     (Consider location/radiation/quality/duration/timing/severity/associated sxs/prior Treatment) Patient is a 82 y.o. female presenting with weakness. The history is provided by the patient (Patient complains of 2 weeks via nauseated. She is not taking hardly any fluids or last few days.).  Weakness This is a new problem. The current episode started 3 to 5 hours ago. The problem occurs constantly. The problem has not changed since onset.Pertinent negatives include no chest pain, no abdominal pain and no headaches. Nothing aggravates the symptoms. Nothing relieves the symptoms.    Past Medical History  Diagnosis Date  . Hypertension   . Hyperlipidemia   . Allergy     seasonal  . Asthma   . GERD (gastroesophageal reflux disease)   . Transient cerebral ischemia    Past Surgical History  Procedure Laterality Date  . Bunionectomy Right   . Carpal tunnel release      Bilaterally  . Abdominal hysterectomy      for fibroids (no BSO)  . Macular degeneration      Dr Zadie Rhine  ; injections  . Colonoscopy  2011    tiny polyp ; [facility]  . Umbilical hernia repair     Family History  Problem Relation Age of Onset  . Alzheimer's disease Mother   . Hypertension Mother   . Stroke Mother 24  . Hypertension Father   . Heart attack Father     in 17's  . Hypertension Maternal Grandmother   . Heart attack Maternal Grandmother 73  . Diabetes      1/2 sister  . Colon cancer Neg Hx   . Esophageal cancer Neg Hx   . Pancreatic cancer Neg Hx   . Kidney disease Neg Hx   . Liver disease Neg Hx    Social History  Substance Use Topics  . Smoking status: Former Smoker    Types: Cigarettes    Quit date: 08/24/1979  . Smokeless tobacco: Never Used     Comment: smoked  1966-1981, up to 1 ppd  . Alcohol Use:  No   OB History    No data available     Review of Systems  Constitutional: Negative for appetite change and fatigue.  HENT: Negative for congestion, ear discharge and sinus pressure.   Eyes: Negative for discharge.  Respiratory: Negative for cough.   Cardiovascular: Negative for chest pain.  Gastrointestinal: Positive for nausea. Negative for abdominal pain and diarrhea.  Genitourinary: Negative for frequency and hematuria.  Musculoskeletal: Negative for back pain.  Skin: Negative for rash.  Neurological: Positive for weakness. Negative for seizures and headaches.  Psychiatric/Behavioral: Negative for hallucinations.      Allergies  Bystolic; Aspirin; Cardizem; Amlodipine; Metoclopramide hcl; and Spironolactone  Home Medications   Prior to Admission medications   Medication Sig Start Date End Date Taking? Authorizing Provider  albuterol (PROVENTIL HFA;VENTOLIN HFA) 108 (90 Base) MCG/ACT inhaler Inhale 1-2 puffs into the lungs every 4 (four) hours as needed for wheezing or shortness of breath.   Yes Historical Provider, MD  Ascorbic Acid (VITAMIN C) 500 MG tablet Take 500 mg by mouth daily.     Yes Historical Provider, MD  atorvastatin (LIPITOR) 40 MG tablet Take half tablet (20 mg total) by mouth on Mondays, Wednesdays, Fridays and Sundays.  Yes Historical Provider, MD  azelastine (ASTELIN) 0.1 % nasal spray Place 2 sprays into both nostrils at bedtime as needed. (allergies) 07/22/15  Yes Historical Provider, MD  Calcium Carbonate (CALTRATE 600 PO) Take 1,200 mg by mouth daily.    Yes Historical Provider, MD  carvedilol (COREG) 6.25 MG tablet Take 1 tablet (6.25 mg total) by mouth 2 (two) times daily. 09/17/15  Yes Belva Crome, MD  chlorthalidone (HYGROTON) 50 MG tablet Take 1 tablet (50 mg total) by mouth daily. 11/07/15  Yes Belva Crome, MD  Cholecalciferol (VITAMIN D3) 2000 UNITS capsule Take 2,000 Units by mouth daily.   Yes Historical Provider, MD  clopidogrel (PLAVIX)  75 MG tablet Take 1 tablet (75 mg total) by mouth daily. 03/24/15  Yes Hoyt Koch, MD  co-enzyme Q-10 30 MG capsule Take 30 mg by mouth daily.    Yes Historical Provider, MD  CVS GARLIC ODORLESS PO Take 1 tablet by mouth daily.   Yes Historical Provider, MD  cycloSPORINE (RESTASIS) 0.05 % ophthalmic emulsion Place 1 drop into both eyes 2 (two) times daily.   Yes Historical Provider, MD  eplerenone (INSPRA) 50 MG tablet Take 2 tablets (100 mg total) by mouth daily. 11/17/15  Yes Belva Crome, MD  FEVERFEW PO Take 2 capsules by mouth daily. For vision   Yes Historical Provider, MD  Fish Oil-Cholecalciferol (FISH OIL + D3 PO) Take 2 capsules by mouth daily.   Yes Historical Provider, MD  fluticasone (FLONASE) 50 MCG/ACT nasal spray Place 2 sprays into both nostrils daily as needed for allergies or rhinitis.   Yes Historical Provider, MD  gabapentin (NEURONTIN) 100 MG capsule TAKE 1 CAPSULE BY MOUTH 3  TIMES DAILY EVERY 8 HOURS  AS NEEDED. 10/07/15  Yes Hoyt Koch, MD  Horse Chestnut 300 MG TABS Take 600 mg by mouth daily.    Yes Historical Provider, MD  hydrALAZINE (APRESOLINE) 100 MG tablet Take 1 tablet (100 mg total) by mouth 3 (three) times daily. 10/14/15  Yes Brittainy M Simmons, PA-C  ipratropium (ATROVENT) 0.06 % nasal spray USE 2 PUFFS IN THE NOSTRIL BID PRF NASAL CONGESTION AND DRAINAGE 12/28/15  Yes Historical Provider, MD  levocetirizine (XYZAL) 5 MG tablet Take 5 mg by mouth daily as needed. (allergies) 07/22/15  Yes Historical Provider, MD  LORazepam (ATIVAN) 0.5 MG tablet Take 1 tablet (0.5 mg total) by mouth daily as needed for anxiety. 11/07/15  Yes Rubbie Battiest, NP  losartan (COZAAR) 100 MG tablet Take 1 tablet (100 mg total) by mouth daily. 09/17/15  Yes Belva Crome, MD  meclizine (ANTIVERT) 25 MG tablet Take 0.5-1 tablets (12.5-25 mg total) by mouth 3 (three) times daily as needed for dizziness. 04/03/15  Yes Hoyt Koch, MD  montelukast (SINGULAIR) 10 MG  tablet Take 10 mg by mouth daily as needed. (allergies) 07/22/15  Yes Historical Provider, MD  pantoprazole (PROTONIX) 20 MG tablet Take 1 tablet (20 mg total) by mouth daily. 03/24/15  Yes Hoyt Koch, MD  potassium chloride (K-DUR,KLOR-CON) 10 MEQ tablet Take 1 tablet (10 mEq total) by mouth 2 (two) times daily. 03/24/15  Yes Hoyt Koch, MD  promethazine (PHENERGAN) 12.5 MG tablet Take 1 tablet (12.5 mg total) by mouth every 8 (eight) hours as needed for nausea or vomiting. 01/30/16  Yes Hoyt Koch, MD  furosemide (LASIX) 40 MG tablet Take 1 tablet by mouth  daily Patient not taking: Reported on 01/30/2016 10/07/15   Lynnell Dike  Tamala Julian, MD  guaiFENesin (MUCINEX) 600 MG 12 hr tablet Take 2 tablets (1,200 mg total) by mouth 2 (two) times daily as needed for cough or to loosen phlegm. Patient not taking: Reported on 01/30/2016 01/08/16   Burnard Hawthorne, FNP   BP 102/70 mmHg  Pulse 55  Temp(Src) 98.2 F (36.8 C) (Oral)  Resp 16  Ht 4\' 10"  (1.473 m)  Wt 135 lb (61.236 kg)  BMI 28.22 kg/m2  SpO2 98% Physical Exam  Constitutional: She is oriented to person, place, and time. She appears well-developed.  HENT:  Head: Normocephalic.  Mucous membranes moist  Eyes: Conjunctivae and EOM are normal. No scleral icterus.  Neck: Neck supple. No thyromegaly present.  Cardiovascular: Normal rate and regular rhythm.  Exam reveals no gallop and no friction rub.   No murmur heard. Pulmonary/Chest: No stridor. She has no wheezes. She has no rales. She exhibits no tenderness.  Abdominal: She exhibits no distension. There is no tenderness. There is no rebound.  Musculoskeletal: Normal range of motion. She exhibits no edema.  Lymphadenopathy:    She has no cervical adenopathy.  Neurological: She is oriented to person, place, and time. She exhibits normal muscle tone. Coordination normal.  Skin: No rash noted. No erythema.  Psychiatric: She has a normal mood and affect. Her behavior is  normal.    ED Course  Procedures (including critical care time) Labs Review Labs Reviewed  CBC WITH DIFFERENTIAL/PLATELET - Abnormal; Notable for the following:    Hemoglobin 11.2 (*)    HCT 32.2 (*)    All other components within normal limits  COMPREHENSIVE METABOLIC PANEL - Abnormal; Notable for the following:    Sodium 128 (*)    Potassium 3.3 (*)    Chloride 95 (*)    CO2 20 (*)    BUN 45 (*)    Creatinine, Ser 1.77 (*)    GFR calc non Af Amer 26 (*)    GFR calc Af Amer 30 (*)    All other components within normal limits  URINALYSIS, ROUTINE W REFLEX MICROSCOPIC (NOT AT [facility]) - Abnormal; Notable for the following:    APPearance CLOUDY (*)    Leukocytes, UA MODERATE (*)    All other components within normal limits  URINE MICROSCOPIC-ADD ON - Abnormal; Notable for the following:    Squamous Epithelial / LPF 0-5 (*)    Bacteria, UA FEW (*)    All other components within normal limits  URINE CULTURE  LIPASE, BLOOD    Imaging Review Dg Abd Acute W/chest  01/30/2016  CLINICAL DATA:  Nausea and fatigue for 1 month. EXAM: DG ABDOMEN ACUTE W/ 1V CHEST COMPARISON:  May 29, 2014 chest x-ray FINDINGS: There is no evidence of dilated bowel loops or free intraperitoneal air. No radiopaque calculi or other significant radiographic abnormality is seen. Heart size and mediastinal contours are within normal limits. Both lungs are clear. IMPRESSION: Negative abdominal radiographs.  No acute cardiopulmonary disease. Electronically Signed   By: Dorise Bullion III M.D   On: 01/30/2016 20:19   I have personally reviewed and evaluated these images and lab results as part of my medical decision-making.   EKG Interpretation None      MDM   Final diagnoses:  Dehydration  UTI (lower urinary tract infection)    Patient will be admitted for dehydration and UTI    Milton Ferguson, MD 01/30/16 2050

## 2016-01-30 NOTE — Progress Notes (Signed)
Pre visit review using our clinic review tool, if applicable. No additional management support is needed unless otherwise documented below in the visit note. 

## 2016-01-30 NOTE — Patient Instructions (Addendum)
We will have you hold the chlorthalidone and hold the eplerenone and the losartan while you are still sick.   We have sent in promethazine to the pharmacy that you can use for nausea to try to get you back eating again. We need you to work on drinking fluids at least. If you are not able to keep down fluids after starting the new medicine we would recommend going to an ER or urgent care for fluids.   We will check the labs today to see if there are any signs of what is causing the problem.

## 2016-01-31 DIAGNOSIS — E860 Dehydration: Secondary | ICD-10-CM | POA: Diagnosis not present

## 2016-01-31 DIAGNOSIS — R110 Nausea: Secondary | ICD-10-CM | POA: Insufficient documentation

## 2016-01-31 DIAGNOSIS — R112 Nausea with vomiting, unspecified: Secondary | ICD-10-CM | POA: Insufficient documentation

## 2016-01-31 DIAGNOSIS — R8271 Bacteriuria: Secondary | ICD-10-CM | POA: Diagnosis not present

## 2016-01-31 DIAGNOSIS — N179 Acute kidney failure, unspecified: Secondary | ICD-10-CM

## 2016-01-31 LAB — PROCALCITONIN

## 2016-01-31 LAB — CREATININE, URINE, RANDOM: Creatinine, Urine: 111.32 mg/dL

## 2016-01-31 LAB — BASIC METABOLIC PANEL
ANION GAP: 10 (ref 5–15)
BUN: 39 mg/dL — ABNORMAL HIGH (ref 6–20)
CALCIUM: 8.4 mg/dL — AB (ref 8.9–10.3)
CO2: 20 mmol/L — AB (ref 22–32)
Chloride: 104 mmol/L (ref 101–111)
Creatinine, Ser: 1.64 mg/dL — ABNORMAL HIGH (ref 0.44–1.00)
GFR, EST AFRICAN AMERICAN: 33 mL/min — AB (ref >60)
GFR, EST NON AFRICAN AMERICAN: 28 mL/min — AB (ref >60)
Glucose, Bld: 88 mg/dL (ref 65–99)
Potassium: 3.6 mmol/L (ref 3.5–5.1)
Sodium: 134 mmol/L — ABNORMAL LOW (ref 135–145)

## 2016-01-31 LAB — NA AND K (SODIUM & POTASSIUM), RAND UR
POTASSIUM UR: 19 mmol/L
SODIUM UR: 10 mmol/L

## 2016-01-31 LAB — LACTIC ACID, PLASMA: Lactic Acid, Venous: 0.5 mmol/L (ref 0.5–2.0)

## 2016-01-31 LAB — OSMOLALITY, URINE: Osmolality, Ur: 277 mOsm/kg — ABNORMAL LOW (ref 300–900)

## 2016-01-31 LAB — TSH: TSH: 0.861 u[IU]/mL (ref 0.350–4.500)

## 2016-01-31 LAB — OSMOLALITY: Osmolality: 284 mOsm/kg (ref 275–295)

## 2016-01-31 MED ORDER — ENSURE ENLIVE PO LIQD: 237.0000 mL | Freq: Two times a day (BID) | ORAL | Status: DC

## 2016-01-31 MED ORDER — SODIUM CHLORIDE 0.9 % IV SOLN
INTRAVENOUS | Status: DC
  Administered 2016-01-31 – 2016-02-01 (×2): via INTRAVENOUS

## 2016-01-31 MED ORDER — DEXTROSE 5 % IV SOLN: 1.0000 g | INTRAVENOUS | Status: DC

## 2016-01-31 NOTE — Progress Notes (Signed)
PROGRESS NOTE    Stacy Page  [id number] DOB: Aug 08, 1933 DOA: 01/30/2016 PCP: Hoyt Koch, MD   Brief Narrative: Stacy Page is a 82 y.o. female with medical history significant for hypertension, hyperlipidemia, and GERD who presents to the ED with nausea, fatigue, loss of appetite, and unintentional weight loss. Patient reports pain in her usual state of health until just over one month ago when she suffered acute bacterial sinusitis which was treated with antibiotics. As the sinusitis resolved, patient developed nausea and poor appetite. Nausea and loss of appetite and persisted for the past month and the patient has lost 15 pounds over this interval. She is becoming increasingly fatigued and complains of malaise  Assessment & Plan:   Principal Problem:   Dehydration Active Problems:   HYPERLIPIDEMIA   Asthma with allergic rhinitis without complication   GERD   Accelerated hypertension   Hypokalemia   Hyponatremia   AKI (acute kidney injury) (HCC)   Normocytic anemia   Nausea   Bacteriuria, asymptomatic  1-AKI; in setting of decrease oral intake, and diuretics.  Improving with IV fluids.  Cr peak to 1.9, it has decrease to 1.6.  Repeat labs in am.  hold eplerenone and losartan,  2-Nausea, loss of appetite, wt loss  Suspect the nausea and poor appetite first developed when she was ill with acute sinusitis, possibly secondary to, or exacerbated by abx use, and now persists in the setting of AKI and electrolyte derangements . Improving. If persist will need further evaluation as outpatient.  Ensure BID.   3-Hyponatremia, hypokalemia  Hold chlorthalidone  Improving with IV fluids.   4-Hyperlipidemia  - Continue current-management with Liptor   5. GERD - No EGD on file to determine presence of esophagitis  - Managed with daily Protonix 20 mg at home, will continue   6. Anxiety  - Stable, managed with prn Ativan at home, will continue   7.  Asymptomatic bacteruria  - Received an empiric dose Rocephin in ED  - Culture is incubating  - No symptoms present, will hold off on further treatment for now, follow-up culture   DVT prophylaxis: lovenox Code Status: full code.  Family Communication: care discussed with daughter who was at bedside.  Disposition Plan: home in 24 hour if renal function improved and urine culture is available.    Consultants:   none   Procedures:   none  Antimicrobials:  ceftriaxone   Subjective: She is feeling better. Denies nausea. Was able to eat half of her breakfast plate.  No appetite, or taste for last 2 weeks since sinus infection.  Denies diarrhea, abdominal pain, dysuria.   Objective: Filed Vitals:   01/30/16 1637 01/30/16 2113 01/30/16 2245 01/31/16 0613  BP: 102/70 138/73 154/71 116/55  Pulse: 55 66 72 59  Temp: 98.2 F (36.8 C) 98.3 F (36.8 C) 98.3 F (36.8 C) 98.2 F (36.8 C)  TempSrc: Oral Oral Oral Oral  Resp: 16 20 24 20   Height: 4\' 10"  (1.473 m)  4\' 10"  (1.473 m)   Weight: 61.236 kg (135 lb)  61.78 kg (136 lb 3.2 oz)   SpO2: 98% 97% 100% 99%    Intake/Output Summary (Last 24 hours) at 01/31/16 1007 Last data filed at 01/31/16 0857  Gross per 24 hour  Intake    150 ml  Output    650 ml  Net   -500 ml   Filed Weights   01/30/16 1637 01/30/16 2245  Weight: 61.236 kg (135 lb) 61.78 kg (136  lb 3.2 oz)    Examination:  General exam: Appears calm and comfortable  Respiratory system: Clear to auscultation. Respiratory effort normal. Cardiovascular system: S1 & S2 heard, RRR. No JVD, murmurs, rubs, gallops or clicks. No pedal edema. Gastrointestinal system: Abdomen is nondistended, soft and nontender. No organomegaly or masses felt. Normal bowel sounds heard. Central nervous system: Alert and oriented. No focal neurological deficits. Extremities: Symmetric 5 x 5 power. Skin: No rashes, lesions or ulcers Psychiatry: Judgement and insight appear normal.  Mood & affect appropriate.     Data Reviewed: I have personally reviewed following labs and imaging studies  CBC:  Recent Labs Lab 01/30/16 1557 01/30/16 1710  WBC 4.7 4.8  NEUTROABS  --  3.2  HGB 11.4* 11.2*  HCT 34.7* 32.2*  MCV 85.1 81.3  PLT 276.0 [id number]   Basic Metabolic Panel:  Recent Labs Lab 01/30/16 1557 01/30/16 1710 01/30/16 2306 01/31/16 0143  NA 129* 128*  --  134*  K 3.4* 3.3*  --  3.6  CL 94* 95*  --  104  CO2 26 20*  --  20*  GLUCOSE 100* 99  --  88  BUN 46* 45*  --  39*  CREATININE 1.97* 1.77*  --  1.64*  CALCIUM 9.4 9.1  --  8.4*  MG  --   --  2.4  --    GFR: Estimated Creatinine Clearance: 21.3 mL/min (by C-G formula based on Cr of 1.64). Liver Function Tests:  Recent Labs Lab 01/30/16 1557 01/30/16 1710  AST 12 16  ALT 10 14  ALKPHOS 89 85  BILITOT 0.5 0.9  PROT 7.5 7.3  ALBUMIN 4.1 4.1    Recent Labs Lab 01/30/16 1557 01/30/16 1710  LIPASE 27.0 21   No results for input(s): AMMONIA in the last 168 hours. Coagulation Profile: No results for input(s): INR, PROTIME in the last 168 hours. Cardiac Enzymes:  Recent Labs Lab 01/30/16 2306  TROPONINI <0.03   BNP (last 3 results) No results for input(s): PROBNP in the last 8760 hours. HbA1C: No results for input(s): HGBA1C in the last 72 hours. CBG: No results for input(s): GLUCAP in the last 168 hours. Lipid Profile: No results for input(s): CHOL, HDL, LDLCALC, TRIG, CHOLHDL, LDLDIRECT in the last 72 hours. Thyroid Function Tests:  Recent Labs  01/30/16 1557 01/30/16 2306  TSH 0.98 0.861  FREET4 1.09  --    Anemia Panel: No results for input(s): VITAMINB12, FOLATE, FERRITIN, TIBC, IRON, RETICCTPCT in the last 72 hours. Sepsis Labs:  Recent Labs Lab 01/30/16 2306 01/31/16 0143  PROCALCITON <0.10  --   LATICACIDVEN 0.9 0.5    No results found for this or any previous visit (from the past 240 hour(s)).       Radiology Studies: Dg Abd Acute  W/chest  01/30/2016  CLINICAL DATA:  Nausea and fatigue for 1 month. EXAM: DG ABDOMEN ACUTE W/ 1V CHEST COMPARISON:  May 29, 2014 chest x-ray FINDINGS: There is no evidence of dilated bowel loops or free intraperitoneal air. No radiopaque calculi or other significant radiographic abnormality is seen. Heart size and mediastinal contours are within normal limits. Both lungs are clear. IMPRESSION: Negative abdominal radiographs.  No acute cardiopulmonary disease. Electronically Signed   By: Dorise Bullion III M.D   On: 01/30/2016 20:19        Scheduled Meds: . antiseptic oral rinse  7 mL Mouth Rinse q12n4p  . atorvastatin  40 mg Oral q1800  . calcium carbonate  2,500 mg  Oral Daily  . carvedilol  6.25 mg Oral BID  . cefTRIAXone (ROCEPHIN)  IV  1 g Intravenous Q24H  . chlorhexidine  15 mL Mouth Rinse BID  . cholecalciferol  2,000 Units Oral Daily  . clopidogrel  75 mg Oral Daily  . cycloSPORINE  1 drop Both Eyes BID  . enoxaparin (LOVENOX) injection  30 mg Subcutaneous Q24H  . feeding supplement (ENSURE ENLIVE)  237 mL Oral BID BM  . gabapentin  100 mg Oral TID  . hydrALAZINE  100 mg Oral TID  . pantoprazole  20 mg Oral Daily  . sodium chloride flush  3 mL Intravenous Q12H   Continuous Infusions: . sodium chloride          Time spent: 25 minutes.     Elmarie Shiley, MD [facility] Pager [phone]  If 7PM-7AM, please contact night-coverage [email or web address] Password [email or web address] 01/31/2016, 10:07 AM

## 2016-01-31 NOTE — Progress Notes (Signed)
Pt bp is 119/56 and pulse 54.  Paged Dr. Rogue Bussing about scheduled coreg and hydaralazine.  Orders given to hold 2200 dose for both medications.

## 2016-01-31 NOTE — Evaluation (Signed)
Physical Therapy Evaluation Patient Details Name: Takyrah Hollingsworth MRN: [id number] DOB: [birth year] Today's Date: 01/31/2016   History of Present Illness  82 yo female admitted with nausea, vomiting, loss of appeite and weight  loss   Clinical Impression  Pt presents with deconditioning and decreased balance from recent illness She needs to use a RW for dynamic balance support at current time     Follow Up Recommendations Home health PT    Equipment Recommendations  None recommended by PT (pt to use daughter's walker )    Recommendations for Other Services       Precautions / Restrictions Precautions Precautions: Fall Restrictions Weight Bearing Restrictions: No      Mobility  Bed Mobility Overal bed mobility: Needs Assistance Bed Mobility: Supine to Sit;Sit to Supine     Supine to sit: Min assist Sit to supine: Min assist   General bed mobility comments: to move legs and adjust in bed   Transfers Overall transfer level: Needs assistance Equipment used: Rolling walker (2 wheeled) Transfers: Sit to/from Stand Sit to Stand: Min assist         General transfer comment: pt unsteady , especially when getting up .   Ambulation/Gait Ambulation/Gait assistance: Min assist Ambulation Distance (Feet): 100 Feet Assistive device: Rolling walker (2 wheeled) Gait Pattern/deviations: Step-through pattern;Decreased step length - right;Decreased step length - left Gait velocity: slow   General Gait Details: pt unsteady when walking without a device   Stairs            Wheelchair Mobility    Modified Rankin (Stroke Patients Only)       Balance Overall balance assessment: Needs assistance Sitting-balance support: Single extremity supported;No upper extremity supported Sitting balance-Leahy Scale: Good     Standing balance support: Bilateral upper extremity supported Standing balance-Leahy Scale: Fair Standing balance comment: needs upper extremity support  to help stand erect                              Pertinent Vitals/Pain Pain Assessment: No/denies pain    Home Living Family/patient expects to be discharged to:: Private residence Living Arrangements: Children (daughter, nephew) Available Help at Discharge: Available 24 hours/day Type of Home: House Home Access: Stairs to enter   Technical brewer of Steps: 2 Home Layout: One level Home Equipment: Environmental consultant - 2 wheels Additional Comments: walker is  one that her daughter used     Prior Function Level of Independence: Independent         Comments: Pt states she hasn't been as active lately      Hand Dominance        Extremity/Trunk Assessment   Upper Extremity Assessment: Generalized weakness           Lower Extremity Assessment: Generalized weakness         Communication   Communication: No difficulties  Cognition Arousal/Alertness: Awake/alert Behavior During Therapy: WFL for tasks assessed/performed Overall Cognitive Status: Within Functional Limits for tasks assessed                      General Comments General comments (skin integrity, edema, etc.): skin intact.  Pt needs and responds well to encouragement for increased activity     Exercises        Assessment/Plan    PT Assessment Patient needs continued PT services  PT Diagnosis Generalized weakness   PT Problem List Decreased strength;Decreased activity tolerance;Decreased balance;Decreased knowledge  of use of DME  PT Treatment Interventions DME instruction;Gait training;Stair training;Therapeutic exercise;Balance training   PT Goals (Current goals can be found in the Care Plan section) Acute Rehab PT Goals Patient Stated Goal: to get stronger PT Goal Formulation: With patient/family Time For Goal Achievement: 02/14/16 Potential to Achieve Goals: Good    Frequency Min 3X/week   Barriers to discharge        Co-evaluation               End of Session  Equipment Utilized During Treatment: Gait belt Activity Tolerance: Patient tolerated treatment well   Nurse Communication: Mobility status    Functional Assessment Tool Used: clincial judgement Functional Limitation: Mobility: Walking and moving around Mobility: Walking and Moving Around Current Status [id number]): At least 20 percent but less than 40 percent impaired, limited or restricted Mobility: Walking and Moving Around Goal Status [phone]): At least 1 percent but less than 20 percent impaired, limited or restricted    Time: [id number] PT Time Calculation (min) (ACUTE ONLY): 25 min   Charges:   PT Evaluation $PT Eval Low Complexity: 1 Procedure     PT G Codes:   PT G-Codes **NOT FOR INPATIENT CLASS** Functional Assessment Tool Used: clincial judgement Functional Limitation: Mobility: Walking and moving around Mobility: Walking and Moving Around Current Status [id number]): At least 20 percent but less than 40 percent impaired, limited or restricted Mobility: Walking and Moving Around Goal Status [phone]): At least 1 percent but less than 20 percent impaired, limited or restricted   Helene Kelp K. Owens Shark, PT  01/31/2016, 5:28 PM

## 2016-01-31 NOTE — Progress Notes (Signed)
   Subjective:    Patient ID: Stacy Page, female    DOB: 03-Jun-1933, 82 y.o.   MRN: [id number]  HPI The patient is an 82 YO female coming in for intracttible nausea without vomiting. This has caused her to lose weight. She is not able to eat more than a couple bites of food or drink before she has not stop due to nausea. She has not tried anything for it. Drinking a ginger ale or crackers did not help. No vomiting. No diarrhea or constipation. No congestion or other cold symptoms. NO cough or fevers or chills. NO new pain.   Review of Systems  Constitutional: Positive for activity change, appetite change, fatigue and unexpected weight change. Negative for fever.  HENT: Negative.   Respiratory: Negative for cough, chest tightness, shortness of breath and wheezing.   Cardiovascular: Negative for chest pain, palpitations and leg swelling.  Gastrointestinal: Positive for nausea. Negative for abdominal pain, diarrhea, constipation and abdominal distention.  Musculoskeletal: Negative.   Skin: Negative.   Neurological: Positive for weakness. Negative for dizziness, speech difficulty, light-headedness, numbness and headaches.  Psychiatric/Behavioral: Negative.       Objective:   Physical Exam  Constitutional: She is oriented to person, place, and time. She appears well-developed and well-nourished.  HENT:  Head: Normocephalic and atraumatic.  Eyes: EOM are normal.  Neck: Normal range of motion.  Cardiovascular: Normal rate and regular rhythm.   Pulmonary/Chest: Effort normal and breath sounds normal. No respiratory distress. She has no wheezes. She has no rales.  Abdominal: Soft. Bowel sounds are normal. She exhibits no distension and no mass. There is no tenderness. There is no rebound and no guarding.  Musculoskeletal: She exhibits no edema.  Neurological: She is alert and oriented to person, place, and time. Coordination normal.  Skin: Skin is warm and dry.   Filed Vitals:   01/30/16  1525  BP: 104/82  Pulse: 55  Temp: 98.3 F (36.8 C)  TempSrc: Oral  Resp: 12  Height: 4\' 10"  (1.473 m)  Weight: 137 lb 12.8 oz (62.506 kg)  SpO2: 98%      Assessment & Plan:

## 2016-01-31 NOTE — Assessment & Plan Note (Signed)
Checking labs and if abnormal will send to ER for fluids. She is given rx for phenergan and advised if not able to tolerate after that to seek care.

## 2016-01-31 NOTE — Assessment & Plan Note (Signed)
Advised her to hold her epleronone and chlorthalidone and losartan while sick and not eating or drinking as this is lowering her BP too much and making her lightheaded. Once eating and drinking normally she will resume.

## 2016-02-01 DIAGNOSIS — N179 Acute kidney failure, unspecified: Secondary | ICD-10-CM | POA: Diagnosis not present

## 2016-02-01 DIAGNOSIS — E860 Dehydration: Secondary | ICD-10-CM | POA: Diagnosis not present

## 2016-02-01 LAB — BASIC METABOLIC PANEL
Anion gap: 5 (ref 5–15)
BUN: 28 mg/dL — AB (ref 6–20)
CHLORIDE: 108 mmol/L (ref 101–111)
CO2: 23 mmol/L (ref 22–32)
Calcium: 8.3 mg/dL — ABNORMAL LOW (ref 8.9–10.3)
Creatinine, Ser: 1.2 mg/dL — ABNORMAL HIGH (ref 0.44–1.00)
GFR calc Af Amer: 48 mL/min — ABNORMAL LOW (ref >60)
GFR calc non Af Amer: 42 mL/min — ABNORMAL LOW (ref >60)
Glucose, Bld: 99 mg/dL (ref 65–99)
POTASSIUM: 4.1 mmol/L (ref 3.5–5.1)
SODIUM: 136 mmol/L (ref 135–145)

## 2016-02-01 LAB — PROCALCITONIN: Procalcitonin: <0.1 ng/mL

## 2016-02-01 LAB — URINE CULTURE: Special Requests: NORMAL

## 2016-02-01 LAB — UREA NITROGEN, URINE: UREA NITROGEN UR: 451 mg/dL

## 2016-02-01 MED ORDER — CARVEDILOL 3.125 MG PO TABS: 3.1250 mg | ORAL_TABLET | Freq: Two times a day (BID) | ORAL | Status: DC

## 2016-02-01 MED ORDER — HYDRALAZINE HCL 100 MG PO TABS: 50.0000 mg | ORAL_TABLET | Freq: Three times a day (TID) | ORAL | Status: DC

## 2016-02-01 MED ORDER — ENSURE ENLIVE PO LIQD: 237.0000 mL | Freq: Two times a day (BID) | ORAL | Status: AC

## 2016-02-01 NOTE — Care Management Note (Signed)
Case Management Note  Patient Details  Name: Stacy Page MRN: [id number] Date of Birth: 1933/04/23  Subjective/Objective:     Dehydration, AKI, nausea               Action/Plan: Discharge Planning: AVS reviewed: NCM spoke to pt at bedside. Pt states she lives with dtr, Chong [place]. States she is ambulatory without DME at home. She was independent prior to admission. Offered choice for [facility] PT. Pt states at this time she declines. Explained she can follow up with PCP and they can arrange [facility] from the office.   Ardine Eng MD Expected Discharge Date:  02/01/2016            Expected Discharge Plan:  Home/Self Care  In-House Referral:  NA  Discharge planning Services  CM Consult  Post Acute Care Choice:  Home Health Choice offered to:  Patient  DME Arranged:  N/A DME Agency:  NA  HH Arranged:  Patient Refused [place] Agency:  NA  Status of Service:  Completed, signed off  Medicare Important Message Given:    Date Medicare IM Given:    Medicare IM give by:    Date Additional Medicare IM Given:    Additional Medicare Important Message give by:     If discussed at [place] of Stay Meetings, dates discussed:    Additional Comments:  Erenest Rasher, RN 02/01/2016, 10:06 AM

## 2016-02-01 NOTE — Discharge Summary (Signed)
Physician Discharge Summary  Stacy Page [id number] DOB: [birth year] DOA: 01/30/2016  PCP: Hoyt Koch, MD  Admit date: 01/30/2016 Discharge date: 02/01/2016  Admitted From:Home. Disposition: Home  Recommendations for Outpatient Follow-up:  1. Follow up with PCP in 1-2 weeks 2. Please obtain BMP/CBC in one week 3. Please follow up on the following pending results: needs adjustment of bp medications.   Home Health; yes Equipment/Devices: none  Discharge Condition: stable  CODE STATUS:full code  Diet recommendation: Heart Healthy  Brief/Interim Summary: 1-AKI; in setting of decrease oral intake, and diuretics.  Improving with IV fluids.  Cr peak to 1.9, it has decrease to 1.2.  hold eplerenone and losartan, at discharge/  Also SBP in the 120, multiples BP medications adjusted.   2-Nausea, loss of appetite, wt loss  Suspect the nausea and poor appetite first developed when she was ill with acute sinusitis, possibly secondary to, or exacerbated by abx use, and now persists in the setting of AKI and electrolyte derangements . Improving. If persist will need further evaluation as outpatient.  Ensure BID.   3-Hyponatremia, hypokalemia  Hold chlorthalidone  Improving with IV fluids.   4-Hyperlipidemia  - Continue current-management with Liptor   5. GERD - No EGD on file to determine presence of esophagitis  - Managed with daily Protonix 20 mg at home, will continue   6. Anxiety  - Stable, managed with prn Ativan at home, will continue   7. Asymptomatic bacteruria  - Received an empiric dose Rocephin in ED  - Culture is incubating  - No symptoms present, will hold off on further treatment for now, urine culture multiples species.   8-HTN;  Bradycardia; will hold coreg. Also SBP in the 120. Continue to hold diuretics, ACE, and will decrease hydralazine with holder parameters.   Discharge Diagnoses:    AKI (acute kidney injury) ([place])    Bacteriuria, asymptomatic   Dehydration   HYPERLIPIDEMIA   Asthma with allergic rhinitis without complication   GERD   Accelerated hypertension   Hypokalemia   Hyponatremia   Normocytic anemia   Nausea     Discharge Instructions      Discharge Instructions    Diet - low sodium heart healthy    Complete by:  As directed      Increase activity slowly    Complete by:  As directed             Medication List    STOP taking these medications        carvedilol 6.25 MG tablet  Commonly known as:  COREG     chlorthalidone 50 MG tablet  Commonly known as:  HYGROTON     eplerenone 50 MG tablet  Commonly known as:  INSPRA     furosemide 40 MG tablet  Commonly known as:  LASIX     guaiFENesin 600 MG 12 hr tablet  Commonly known as:  MUCINEX     [place] 300 MG Tabs     losartan 100 MG tablet  Commonly known as:  COZAAR     potassium chloride 10 MEQ tablet  Commonly known as:  K-DUR,KLOR-CON     promethazine 12.5 MG tablet  Commonly known as:  PHENERGAN      TAKE these medications        albuterol 108 (90 Base) MCG/ACT inhaler  Commonly known as:  PROVENTIL HFA;VENTOLIN HFA  Inhale 1-2 puffs into the lungs every 4 (four) hours as needed for wheezing or shortness of breath.  atorvastatin 40 MG tablet  Commonly known as:  LIPITOR  Take half tablet (20 mg total) by mouth on Mondays, Wednesdays, Fridays and Sundays.     azelastine 0.1 % nasal spray  Commonly known as:  ASTELIN  Place 2 sprays into both nostrils at bedtime as needed. (allergies)     CALTRATE 600 PO  Take 1,200 mg by mouth daily.     clopidogrel 75 MG tablet  Commonly known as:  PLAVIX  Take 1 tablet (75 mg total) by mouth daily.     co-enzyme Q-10 30 MG capsule  Take 30 mg by mouth daily.     CVS GARLIC ODORLESS PO  Take 1 tablet by mouth daily.     cycloSPORINE 0.05 % ophthalmic emulsion  Commonly known as:  RESTASIS  Place 1 drop into both eyes 2 (two) times daily.      feeding supplement (ENSURE ENLIVE) Liqd  Take 237 mLs by mouth 2 (two) times daily between meals.     FEVERFEW PO  Take 2 capsules by mouth daily. For vision     FISH OIL + D3 PO  Take 2 capsules by mouth daily.     fluticasone 50 MCG/ACT nasal spray  Commonly known as:  FLONASE  Place 2 sprays into both nostrils daily as needed for allergies or rhinitis.     gabapentin 100 MG capsule  Commonly known as:  NEURONTIN  TAKE 1 CAPSULE BY MOUTH 3  TIMES DAILY EVERY 8 HOURS  AS NEEDED.     hydrALAZINE 100 MG tablet  Commonly known as:  APRESOLINE  Take 0.5 tablets (50 mg total) by mouth 3 (three) times daily.     ipratropium 0.06 % nasal spray  Commonly known as:  ATROVENT  USE 2 PUFFS IN THE NOSTRIL BID PRF NASAL CONGESTION AND DRAINAGE     levocetirizine 5 MG tablet  Commonly known as:  XYZAL  Take 5 mg by mouth daily as needed. (allergies)     LORazepam 0.5 MG tablet  Commonly known as:  ATIVAN  Take 1 tablet (0.5 mg total) by mouth daily as needed for anxiety.     meclizine 25 MG tablet  Commonly known as:  ANTIVERT  Take 0.5-1 tablets (12.5-25 mg total) by mouth 3 (three) times daily as needed for dizziness.     montelukast 10 MG tablet  Commonly known as:  SINGULAIR  Take 10 mg by mouth daily as needed. (allergies)     pantoprazole 20 MG tablet  Commonly known as:  PROTONIX  Take 1 tablet (20 mg total) by mouth daily.     vitamin C 500 MG tablet  Commonly known as:  ASCORBIC ACID  Take 500 mg by mouth daily.     Vitamin D3 2000 units capsule  Take 2,000 Units by mouth daily.       Follow-up Information    Follow up with Hoyt Koch, MD In 2 days.   Specialty:  Internal Medicine   Why:  you need adjustment of Blood pressure medications.    Contact information:   [place] [address] [phone]      Allergies  Allergen Reactions  . Bystolic [Nebivolol Hcl]     Held by Hospitalist @ 05/22/14 admission due to bradycardia (P  44)  . Aspirin Other (See Comments)    gastritis  . Cardizem [Diltiazem Hcl] Other (See Comments)    bradycardia  . Amlodipine Swelling    LE swelling  .  Metoclopramide Hcl Other (See Comments)    UNKNOWN  . Spironolactone Other (See Comments)    unknown    Consultations:  none   Procedures/Studies: Dg Abd Acute W/chest  01/30/2016  CLINICAL DATA:  Nausea and fatigue for 1 month. EXAM: DG ABDOMEN ACUTE W/ 1V CHEST COMPARISON:  May 29, 2014 chest x-ray FINDINGS: There is no evidence of dilated bowel loops or free intraperitoneal air. No radiopaque calculi or other significant radiographic abnormality is seen. Heart size and mediastinal contours are within normal limits. Both lungs are clear. IMPRESSION: Negative abdominal radiographs.  No acute cardiopulmonary disease. Electronically Signed   By: Dorise Bullion III M.D   On: 01/30/2016 20:19      Subjective: She is feeling better, eating more. Appetite improved  Discharge Exam: Filed Vitals:   01/31/16 2146 02/01/16 0508  BP: 119/56 126/65  Pulse: 54 55  Temp: 97.6 F (36.4 C) 98.2 F (36.8 C)  Resp: 16 16   Filed Vitals:   01/31/16 0613 01/31/16 1500 01/31/16 2146 02/01/16 0508  BP: 116/55 107/57 119/56 126/65  Pulse: 59 51 54 55  Temp: 98.2 F (36.8 C) 98.4 F (36.9 C) 97.6 F (36.4 C) 98.2 F (36.8 C)  TempSrc: Oral Oral Oral Oral  Resp: 20 18 16 16   Height:      Weight:    64.955 kg (143 lb 3.2 oz)  SpO2: 99% 98% 100% 99%    General: Pt is alert, awake, not in acute distress Cardiovascular: RRR, S1/S2 +, no rubs, no gallops Respiratory: CTA bilaterally, no wheezing, no rhonchi Abdominal: Soft, NT, ND, bowel sounds + Extremities: no edema, no cyanosis    The results of significant diagnostics from this hospitalization (including imaging, microbiology, ancillary and laboratory) are listed below for reference.     Microbiology: Recent Results (from the past 240 hour(s))  Urine culture     Status:  Abnormal   Collection Time: 01/30/16  7:38 PM  Result Value Ref Range Status   Specimen Description URINE, RANDOM  Final   Special Requests Normal  Final   Culture MULTIPLE SPECIES PRESENT, SUGGEST RECOLLECTION (A)  Final   Report Status 02/01/2016 FINAL  Final     Labs: BNP (last 3 results) No results for input(s): BNP in the last 8760 hours. Basic Metabolic Panel:  Recent Labs Lab 01/30/16 1557 01/30/16 1710 01/30/16 2306 01/31/16 0143 02/01/16 0543  NA 129* 128*  --  134* 136  K 3.4* 3.3*  --  3.6 4.1  CL 94* 95*  --  104 108  CO2 26 20*  --  20* 23  GLUCOSE 100* 99  --  88 99  BUN 46* 45*  --  39* 28*  CREATININE 1.97* 1.77*  --  1.64* 1.20*  CALCIUM 9.4 9.1  --  8.4* 8.3*  MG  --   --  2.4  --   --    Liver Function Tests:  Recent Labs Lab 01/30/16 1557 01/30/16 1710  AST 12 16  ALT 10 14  ALKPHOS 89 85  BILITOT 0.5 0.9  PROT 7.5 7.3  ALBUMIN 4.1 4.1    Recent Labs Lab 01/30/16 1557 01/30/16 1710  LIPASE 27.0 21   No results for input(s): AMMONIA in the last 168 hours. CBC:  Recent Labs Lab 01/30/16 1557 01/30/16 1710  WBC 4.7 4.8  NEUTROABS  --  3.2  HGB 11.4* 11.2*  HCT 34.7* 32.2*  MCV 85.1 81.3  PLT 276.0 283   Cardiac Enzymes:  Recent Labs Lab 01/30/16 2306  TROPONINI <0.03   BNP: Invalid input(s): POCBNP CBG: No results for input(s): GLUCAP in the last 168 hours. D-Dimer No results for input(s): DDIMER in the last 72 hours. Hgb A1c No results for input(s): HGBA1C in the last 72 hours. Lipid Profile No results for input(s): CHOL, HDL, LDLCALC, TRIG, CHOLHDL, LDLDIRECT in the last 72 hours. Thyroid function studies  Recent Labs  01/30/16 2306  TSH 0.861   Anemia work up No results for input(s): VITAMINB12, FOLATE, FERRITIN, TIBC, IRON, RETICCTPCT in the last 72 hours. Urinalysis    Component Value Date/Time   COLORURINE YELLOW 01/30/2016 1938   APPEARANCEUR CLOUDY* 01/30/2016 1938   LABSPEC 1.012 01/30/2016 1938    PHURINE 5.0 01/30/2016 1938   GLUCOSEU NEGATIVE 01/30/2016 1938   HGBUR NEGATIVE 01/30/2016 1938   BILIRUBINUR NEGATIVE 01/30/2016 1938   KETONESUR NEGATIVE 01/30/2016 1938   PROTEINUR NEGATIVE 01/30/2016 1938   UROBILINOGEN 0.2 05/29/2014 1753   NITRITE NEGATIVE 01/30/2016 1938   LEUKOCYTESUR MODERATE* 01/30/2016 1938   Sepsis Labs Invalid input(s): PROCALCITONIN,  WBC,  LACTICIDVEN Microbiology Recent Results (from the past 240 hour(s))  Urine culture     Status: Abnormal   Collection Time: 01/30/16  7:38 PM  Result Value Ref Range Status   Specimen Description URINE, RANDOM  Final   Special Requests Normal  Final   Culture MULTIPLE SPECIES PRESENT, SUGGEST RECOLLECTION (A)  Final   Report Status 02/01/2016 FINAL  Final     Time coordinating discharge: Over 30 minutes  SIGNED:   Elmarie Shiley, MD  [facility] 02/01/2016, 11:34 AM Pager [phone]  If 7PM-7AM, please contact night-coverage [email or web address] Password [email or web address]

## 2016-02-01 NOTE — Discharge Instructions (Signed)
Check Blood pressure at Home, if Blood pressure less than [id number] systolic, do not take hydralazine.

## 2016-02-01 NOTE — Care Management Obs Status (Signed)
[place] NOTIFICATION   Patient Details  Name: Stacy Page MRN: [id number] Date of Birth: [birth year]   Medicare Observation Status Notification Given:  Yes    Erenest Rasher, RN 02/01/2016, 9:34 AM

## 2016-02-01 NOTE — Progress Notes (Signed)
Report received from K. Holloway, RN. I agree with previous RN's assessment. Will continue to monitor pt closely. Asaro, Mykael Batz I 

## 2016-02-01 NOTE — Progress Notes (Signed)
Completed D/C teaching. Answered all questions. Gave prescriptions. Pt will Be D/C home with family in stable condition.

## 2016-02-02 ENCOUNTER — Telehealth: Payer: Self-pay | Admitting: *Deleted

## 2016-02-02 NOTE — Telephone Encounter (Signed)
Transition Care Management Follow-up Telephone Call   Date discharged? 02/01/16   How have you been since you were released from the hospital? Pt states she is feeling ok   Do you understand why you were in the hospital? YES   Do you understand the discharge instructions? YES   Where were you discharged to? Home   Items Reviewed:  Medications reviewed: YES  Allergies reviewed: YES   Dietary changes reviewed: YES  Referrals reviewed: No referral needed   Functional Questionnaire:   Activities of Daily Living (ADLs):   She states she are independent in the following: ambulation, bathing and hygiene, feeding, continence, grooming, toileting and dressing States she doesn't require assistance   Any transportation issues/concerns?: NO   Any patient concerns? NO   Confirmed importance and date/time of follow-up visits scheduled YES, appt 02/04/16  Provider Appointment booked with Dr. Jenny Reichmann since no availability was w/PCP  Confirmed with patient if condition begins to worsen call PCP or go to the ER.  Patient was given the office number and encouraged to call back with question or concerns.  : YES

## 2016-02-03 ENCOUNTER — Telehealth: Payer: Self-pay | Admitting: *Deleted

## 2016-02-03 NOTE — Telephone Encounter (Signed)
Left msg on triage stating grandmother been release from hosp have an appt 6/14, but her BP has been elevated check this pm 177/99. Currently taking 1/2 of the hydralazine three times a day. Wanting to know does she need to take the whole pill...Johny Chess

## 2016-02-03 NOTE — Telephone Encounter (Signed)
Is she having any symptoms such as headache or chest pain? If not then can wait until tomorrow. Is she is having symptoms would recommend to take 1 pill tonight then 1/2 pill in the morning.

## 2016-02-03 NOTE — Telephone Encounter (Signed)
Notified pt/grandaughter w/MD response. She states she is not having any headaches or CP the reason why the hydralazinewas cut in half because when she went to hosp her BP was low & she wasn't eating. Will keep appt for in the morning @ 8:30...Stacy Page

## 2016-02-04 ENCOUNTER — Other Ambulatory Visit (INDEPENDENT_AMBULATORY_CARE_PROVIDER_SITE_OTHER): Payer: Medicare Other

## 2016-02-04 ENCOUNTER — Encounter: Payer: Self-pay | Admitting: Internal Medicine

## 2016-02-04 ENCOUNTER — Other Ambulatory Visit: Payer: Self-pay | Admitting: Internal Medicine

## 2016-02-04 ENCOUNTER — Ambulatory Visit (INDEPENDENT_AMBULATORY_CARE_PROVIDER_SITE_OTHER): Payer: Medicare Other | Admitting: Internal Medicine

## 2016-02-04 VITALS — BP 140/82 | HR 91 | Temp 98.0°F | Resp 20 | Wt 139.0 lb

## 2016-02-04 DIAGNOSIS — R110 Nausea: Secondary | ICD-10-CM | POA: Diagnosis not present

## 2016-02-04 DIAGNOSIS — N390 Urinary tract infection, site not specified: Secondary | ICD-10-CM | POA: Diagnosis not present

## 2016-02-04 DIAGNOSIS — N179 Acute kidney failure, unspecified: Secondary | ICD-10-CM

## 2016-02-04 DIAGNOSIS — R410 Disorientation, unspecified: Secondary | ICD-10-CM

## 2016-02-04 DIAGNOSIS — I10 Essential (primary) hypertension: Secondary | ICD-10-CM

## 2016-02-04 DIAGNOSIS — F05 Delirium due to known physiological condition: Secondary | ICD-10-CM | POA: Diagnosis not present

## 2016-02-04 DIAGNOSIS — R634 Abnormal weight loss: Secondary | ICD-10-CM | POA: Insufficient documentation

## 2016-02-04 LAB — BASIC METABOLIC PANEL
BUN: 8 mg/dL (ref 6–23)
CHLORIDE: 100 meq/L (ref 96–112)
CO2: 26 meq/L (ref 19–32)
CREATININE: 0.98 mg/dL (ref 0.40–1.20)
Calcium: 9.5 mg/dL (ref 8.4–10.5)
GFR: 70.07 mL/min (ref >60.00)
Glucose, Bld: 92 mg/dL (ref 70–99)
Potassium: 3.2 mEq/L — ABNORMAL LOW (ref 3.5–5.1)
Sodium: 137 mEq/L (ref 135–145)

## 2016-02-04 LAB — CBC WITH DIFFERENTIAL/PLATELET
BASOS PCT: 0.7 % (ref 0.0–3.0)
Basophils Absolute: 0 10*3/uL (ref 0.0–0.1)
EOS ABS: 0.1 10*3/uL (ref 0.0–0.7)
Eosinophils Relative: 1.7 % (ref 0.0–5.0)
HEMATOCRIT: 32.7 % — AB (ref 36.0–46.0)
Hemoglobin: 11.1 g/dL — ABNORMAL LOW (ref 12.0–15.0)
LYMPHS PCT: 23.4 % (ref 12.0–46.0)
Lymphs Abs: 1.3 10*3/uL (ref 0.7–4.0)
MCHC: 33.8 g/dL (ref 30.0–36.0)
MCV: 85 fl (ref 78.0–100.0)
MONO ABS: 0.6 10*3/uL (ref 0.1–1.0)
Monocytes Relative: 10.3 % (ref 3.0–12.0)
NEUTROS ABS: 3.7 10*3/uL (ref 1.4–7.7)
Neutrophils Relative %: 63.9 % (ref 43.0–77.0)
PLATELETS: 254 10*3/uL (ref 150.0–400.0)
RBC: 3.85 Mil/uL — ABNORMAL LOW (ref 3.87–5.11)
RDW: 14.6 % (ref 11.5–15.5)
WBC: 5.8 10*3/uL (ref 4.0–10.5)

## 2016-02-04 LAB — URINALYSIS, ROUTINE W REFLEX MICROSCOPIC
BILIRUBIN URINE: NEGATIVE
Hgb urine dipstick: NEGATIVE
Ketones, ur: NEGATIVE
Nitrite: NEGATIVE
PH: 7 (ref 5.0–8.0)
TOTAL PROTEIN, URINE-UPE24: NEGATIVE
UROBILINOGEN UA: 0.2 (ref 0.0–1.0)
Urine Glucose: NEGATIVE

## 2016-02-04 MED ORDER — ONDANSETRON HCL 4 MG PO TABS: 4.0000 mg | ORAL_TABLET | Freq: Three times a day (TID) | ORAL | Status: DC | PRN

## 2016-02-04 MED ORDER — POTASSIUM CHLORIDE ER 10 MEQ PO TBCR: 10.0000 meq | EXTENDED_RELEASE_TABLET | Freq: Every day | ORAL | Status: DC

## 2016-02-04 NOTE — Assessment & Plan Note (Signed)
See above nausea, may need EGD

## 2016-02-04 NOTE — Progress Notes (Signed)
Pre visit review using our clinic review tool, if applicable. No additional management support is needed unless otherwise documented below in the visit note. 

## 2016-02-04 NOTE — Progress Notes (Signed)
Subjective:    Patient ID: Stacy Page, female    DOB: Jan 28, 1933, 82 y.o.   MRN: [id number]   HPI  Here to f/u, with recent ? UTI or recent sinusitis (culture not helpful) with nausea, presumed decreased po intake. Dehydration, AKD, low K and Na, hospd June 9 - 11.  Since d/c per daughter seems to have wax and wane days of more and less "on top of things" had been somewhat confused about her meds x 2 wks prior to hosp (? Low BP) and, some wax and wane similar post hosp and also some nausea and low appetitie  - "makes me sick to smell cooking." No vomiting.  Has lost several lbs post hosp Dec 30, 2015 colonoscopy neg, for GI f/u prn. No prior hx of EGD. Wt Readings from Last 3 Encounters:  02/04/16 139 lb (63.05 kg)  02/01/16 143 lb 3.2 oz (64.955 kg)  01/30/16 137 lb 12.8 oz (62.506 kg)  Denies worsening reflux on PPI, abd pain, dysphagia, bowel change or blood.  Has hx of TIA 2015, on chronic plavix, has allergy to asa.  No recent medication changes, Mult meds stopped at hosp including coreg, chlorthalidone, Inspra, lasix, losartan due to lower BP, but after hospn BP noted increased at home. Hydralazine decreased as well.  BP 170 sbp yesterday per daughter who is PA   Last Cr improved to 1.2 at d/c, with f/u recommendation for cbc and bmp today. Denies urinary symptoms such as dysuria, frequency, urgency, flank pain, hematuria or n/v, fever, chills.  Pt denies chest pain, increased sob or doe, wheezing, orthopnea, PND, increased LE swelling, palpitations, dizziness or syncope.   Past Medical History  Diagnosis Date  . Hypertension   . Hyperlipidemia   . Allergy     seasonal  . Asthma   . GERD (gastroesophageal reflux disease)   . Transient cerebral ischemia    Past Surgical History  Procedure Laterality Date  . Bunionectomy Right   . Carpal tunnel release      Bilaterally  . Abdominal hysterectomy      for fibroids (no BSO)  . Macular degeneration      Dr Zadie Rhine  ; injections  .  Colonoscopy  2011    tiny polyp ; [facility]  . Umbilical hernia repair      reports that she quit smoking about 36 years ago. Her smoking use included Cigarettes. She has never used smokeless tobacco. She reports that she does not drink alcohol or use illicit drugs. family history includes Alzheimer's disease in her mother; Heart attack in her father; Heart attack (age of onset: 83) in her maternal grandmother; Hypertension in her father, maternal grandmother, and mother; Stroke (age of onset: 66) in her mother. There is no history of Colon cancer, Esophageal cancer, Pancreatic cancer, Kidney disease, or Liver disease. Allergies  Allergen Reactions  . Bystolic [Nebivolol Hcl]     Held by Hospitalist @ 05/22/14 admission due to bradycardia (P 44)  . Aspirin Other (See Comments)    gastritis  . Cardizem [Diltiazem Hcl] Other (See Comments)    bradycardia  . Amlodipine Swelling    LE swelling  . Metoclopramide Hcl Other (See Comments)    UNKNOWN  . Spironolactone Other (See Comments)    unknown   Current Outpatient Prescriptions on File Prior to Visit  Medication Sig Dispense Refill  . albuterol (PROVENTIL HFA;VENTOLIN HFA) 108 (90 Base) MCG/ACT inhaler Inhale 1-2 puffs into the lungs every 4 (four) hours  as needed for wheezing or shortness of breath.    . Ascorbic Acid (VITAMIN C) 500 MG tablet Take 500 mg by mouth daily.      Marland Kitchen atorvastatin (LIPITOR) 40 MG tablet Take half tablet (20 mg total) by mouth on Mondays, Wednesdays, Fridays and Sundays.    Marland Kitchen azelastine (ASTELIN) 0.1 % nasal spray Place 2 sprays into both nostrils at bedtime as needed. (allergies)    . Calcium Carbonate (CALTRATE 600 PO) Take 1,200 mg by mouth daily.     . Cholecalciferol (VITAMIN D3) 2000 UNITS capsule Take 2,000 Units by mouth daily.    . clopidogrel (PLAVIX) 75 MG tablet Take 1 tablet (75 mg total) by mouth daily. 90 tablet 3  . co-enzyme Q-10 30 MG capsule Take 30 mg by mouth daily.     . CVS GARLIC  ODORLESS PO Take 1 tablet by mouth daily.    . cycloSPORINE (RESTASIS) 0.05 % ophthalmic emulsion Place 1 drop into both eyes 2 (two) times daily.    . feeding supplement, ENSURE ENLIVE, (ENSURE ENLIVE) LIQD Take 237 mLs by mouth 2 (two) times daily between meals. 237 mL 12  . FEVERFEW PO Take 2 capsules by mouth daily. For vision    . Fish Oil-Cholecalciferol (FISH OIL + D3 PO) Take 2 capsules by mouth daily.    . fluticasone (FLONASE) 50 MCG/ACT nasal spray Place 2 sprays into both nostrils daily as needed for allergies or rhinitis.    Marland Kitchen gabapentin (NEURONTIN) 100 MG capsule TAKE 1 CAPSULE BY MOUTH 3  TIMES DAILY EVERY 8 HOURS  AS NEEDED. 270 capsule 3  . hydrALAZINE (APRESOLINE) 100 MG tablet Take 0.5 tablets (50 mg total) by mouth 3 (three) times daily. 20 tablet 0  . ipratropium (ATROVENT) 0.06 % nasal spray USE 2 PUFFS IN THE NOSTRIL BID PRF NASAL CONGESTION AND DRAINAGE  0  . levocetirizine (XYZAL) 5 MG tablet Take 5 mg by mouth daily as needed. (allergies)    . LORazepam (ATIVAN) 0.5 MG tablet Take 1 tablet (0.5 mg total) by mouth daily as needed for anxiety. 30 tablet 0  . meclizine (ANTIVERT) 25 MG tablet Take 0.5-1 tablets (12.5-25 mg total) by mouth 3 (three) times daily as needed for dizziness. 45 tablet 3  . montelukast (SINGULAIR) 10 MG tablet Take 10 mg by mouth daily as needed. (allergies)    . pantoprazole (PROTONIX) 20 MG tablet Take 1 tablet (20 mg total) by mouth daily. 90 tablet 3   No current facility-administered medications on file prior to visit.     Review of Systems  Constitutional: Negative for unusual diaphoresis or night sweats HENT: Negative for ear swelling or discharge Eyes: Negative for worsening visual haziness  Respiratory: Negative for choking and stridor.   Gastrointestinal: Negative for distension or worsening eructation Genitourinary: Negative for retention or change in urine volume.  Musculoskeletal: Negative for other MSK pain or swelling Skin:  Negative for color change and worsening wound Neurological: Negative for tremors and numbness other than noted  Psychiatric/Behavioral: Negative for decreased concentration or agitation other than above       Objective:   Physical Exam BP 140/82 mmHg  Pulse 91  Temp(Src) 98 F (36.7 C) (Oral)  Resp 20  Wt 139 lb (63.05 kg)  SpO2 98%\ VS noted, obese Constitutional: Pt appears in no apparent distress HENT: Head: NCAT.  Right Ear: External ear normal.  Left Ear: External ear normal.  Eyes: . Pupils are equal, round, and reactive to light.  Conjunctivae and EOM are normal Neck: Normal range of motion. Neck supple.  Cardiovascular: Normal rate and regular rhythm.   Pulmonary/Chest: Effort normal and breath sounds without rales or wheezing.  Abd:  Soft, NT, ND, + BS, no flank tender Neurological: Pt is alert. + mild ST memory confused , motor grossly intact, gait slowed Skin: Skin is warm. No rash, no LE edema Psychiatric: Pt behavior is normal. No agitation.   Lab Results  Component Value Date   WBC 4.8 01/30/2016   HGB 11.2* 01/30/2016   HCT 32.2* 01/30/2016   PLT 283 01/30/2016   GLUCOSE 99 02/01/2016   CHOL 172 09/12/2014   TRIG 98.0 09/12/2014   HDL 47.40 09/12/2014   LDLDIRECT 165.5 09/24/2009   LDLCALC 105* 09/12/2014   ALT 14 01/30/2016   AST 16 01/30/2016   NA 136 02/01/2016   K 4.1 02/01/2016   CL 108 02/01/2016   CREATININE 1.20* 02/01/2016   BUN 28* 02/01/2016   CO2 23 02/01/2016   TSH 0.861 01/30/2016   INR 0.98 05/22/2014   HGBA1C 5.7* 05/23/2014  CLINICAL DATA: Nausea and fatigue for 1 month.  EXAM: DG ABDOMEN ACUTE W/ 1V CHEST  COMPARISON: May 29, 2014 chest x-ray  FINDINGS: There is no evidence of dilated bowel loops or free intraperitoneal air. No radiopaque calculi or other significant radiographic abnormality is seen. Heart size and mediastinal contours are within normal limits. Both lungs are clear.  IMPRESSION: Negative  abdominal radiographs. No acute cardiopulmonary disease.   Electronically Signed  By: Dorise Bullion III M.D  On: 01/30/2016 20:19 Urine culture  Status: Finalresult Visible to patient:  MyChart Nextappt: None            5d ago    Specimen Description URINE, RANDOM   Special Requests Normal   Culture MULTIPLE SPECIES PRESENT, SUGGEST RECOLLECTION (A)   Report Status 02/01/2016 FINAL   Resulting Agency [facility]      Specimen Collected: 01/30/16 7:38 PM Last Resulted: 02/01/16 10:15 AM            Assessment & Plan:

## 2016-02-04 NOTE — Assessment & Plan Note (Signed)
Asympt, exam benign, for repeat urine testing today due to wax and wane confusion,  to f/u any worsening symptoms or concerns

## 2016-02-04 NOTE — Assessment & Plan Note (Signed)
Persists post hosp with low appetitie, to cont PPI, for labs today but also check wt daily at home, if cont's to lose wt over 1 wk despite ensure bid, then will need GI referral - ? Need EGD, also zofran prn given

## 2016-02-04 NOTE — Assessment & Plan Note (Addendum)
Etiology unclear, seems c/w TME but no definite process other than dehydration recently found; cant r/o subacute CVA with hx of TIA, cont plavix, check Head MRI  Note:  Total time for pt hx, exam, review of record with pt in the room, determination of diagnoses and plan for further eval and tx is > 40 min, with over 50% spent in coordination and counseling of patient

## 2016-02-04 NOTE — Assessment & Plan Note (Signed)
Likely resolved, ok for f/u lab ,  to f/u any worsening symptoms or concerns

## 2016-02-04 NOTE — Assessment & Plan Note (Signed)
BP ok but may be increasing at home ,daughter is PA, will cont to monitor, cont current meds as is for now

## 2016-02-04 NOTE — Patient Instructions (Signed)
Please take all new medication as prescribed  - the zofran for nausea  Please check your weight daily at home, continue the ensure supplements, but call in 1 week for GI referral if you continue to lose weight  Please continue all other medications as before, and refills have been done if requested.  Please have the pharmacy call with any other refills you may need.  Please keep your appointments with your specialists as you may have planned  You will be contacted regarding the referral for: Head MRI (see Ryan now)  Please go to the LAB in the Basement (turn left off the elevator) for the tests to be done today  You will be contacted by phone if any changes need to be made immediately.  Otherwise, you will receive a letter about your results with an explanation, but please check with MyChart first.  Please remember to sign up for MyChart if you have not done so, as this will be important to you in the future with finding out test results, communicating by private email, and scheduling acute appointments online when needed.  Please see Dr Sharlet Salina in 2 weeks to follow up all of this, and Blood Pressure as well

## 2016-02-05 ENCOUNTER — Ambulatory Visit
Admission: RE | Admit: 2016-02-05 | Discharge: 2016-02-05 | Disposition: A | Discharge status: Home / Self Care | Payer: Medicare Other | Source: Ambulatory Visit | Attending: Internal Medicine | Admitting: Internal Medicine

## 2016-02-05 DIAGNOSIS — F05 Delirium due to known physiological condition: Principal | ICD-10-CM

## 2016-02-05 DIAGNOSIS — R413 Other amnesia: Secondary | ICD-10-CM | POA: Diagnosis not present

## 2016-02-05 DIAGNOSIS — R410 Disorientation, unspecified: Secondary | ICD-10-CM

## 2016-02-05 LAB — URINE CULTURE: Colony Count: 25000

## 2016-02-17 ENCOUNTER — Other Ambulatory Visit (INDEPENDENT_AMBULATORY_CARE_PROVIDER_SITE_OTHER): Payer: Medicare Other

## 2016-02-17 ENCOUNTER — Other Ambulatory Visit: Payer: Self-pay | Admitting: Internal Medicine

## 2016-02-17 ENCOUNTER — Ambulatory Visit (INDEPENDENT_AMBULATORY_CARE_PROVIDER_SITE_OTHER): Payer: Medicare Other | Admitting: Internal Medicine

## 2016-02-17 ENCOUNTER — Encounter: Payer: Self-pay | Admitting: Internal Medicine

## 2016-02-17 VITALS — BP 184/110 | HR 85 | Temp 98.5°F | Resp 16 | Ht <= 58 in | Wt 141.1 lb

## 2016-02-17 DIAGNOSIS — E876 Hypokalemia: Secondary | ICD-10-CM

## 2016-02-17 DIAGNOSIS — Z23 Encounter for immunization: Secondary | ICD-10-CM | POA: Diagnosis not present

## 2016-02-17 DIAGNOSIS — R8271 Bacteriuria: Secondary | ICD-10-CM

## 2016-02-17 DIAGNOSIS — I10 Essential (primary) hypertension: Secondary | ICD-10-CM | POA: Diagnosis not present

## 2016-02-17 DIAGNOSIS — R634 Abnormal weight loss: Secondary | ICD-10-CM

## 2016-02-17 DIAGNOSIS — R110 Nausea: Secondary | ICD-10-CM

## 2016-02-17 LAB — COMPREHENSIVE METABOLIC PANEL
ALK PHOS: 100 U/L (ref 39–117)
ALT: 18 U/L (ref 0–35)
AST: 15 U/L (ref 0–37)
Albumin: 4.1 g/dL (ref 3.5–5.2)
BILIRUBIN TOTAL: 0.3 mg/dL (ref 0.2–1.2)
BUN: 12 mg/dL (ref 6–23)
CALCIUM: 9.3 mg/dL (ref 8.4–10.5)
CO2: 29 mEq/L (ref 19–32)
Chloride: 107 mEq/L (ref 96–112)
Creatinine, Ser: 0.92 mg/dL (ref 0.40–1.20)
GFR: 75.36 mL/min (ref >60.00)
GLUCOSE: 88 mg/dL (ref 70–99)
POTASSIUM: 4 meq/L (ref 3.5–5.1)
Sodium: 141 mEq/L (ref 135–145)
TOTAL PROTEIN: 7.4 g/dL (ref 6.0–8.3)

## 2016-02-17 MED ORDER — LOSARTAN POTASSIUM 100 MG PO TABS: 100.0000 mg | ORAL_TABLET | Freq: Every day | ORAL | Status: DC

## 2016-02-17 MED ORDER — LEVOCETIRIZINE DIHYDROCHLORIDE 5 MG PO TABS: 5.0000 mg | ORAL_TABLET | Freq: Every evening | ORAL | Status: DC

## 2016-02-17 NOTE — Patient Instructions (Signed)
We have sent in the losartan back to the pharmacy. If you have a bottle left at home you can take that.   We have also sent in the xyzal for the allergies to take daily.   We are checking the labs today.

## 2016-02-17 NOTE — Progress Notes (Signed)
Pre visit review using our clinic review tool, if applicable. No additional management support is needed unless otherwise documented below in the visit note. 

## 2016-02-18 ENCOUNTER — Encounter: Payer: Self-pay | Admitting: Internal Medicine

## 2016-02-18 NOTE — Progress Notes (Signed)
   Subjective:    Patient ID: Stacy Page, female    DOB: Jan 31, 1933, 82 y.o.   MRN: [id number]  HPI The patient is an 82 YO female coming in for follow up of her nausea and hospitalization. She is no longer having nausea or vomiting. Her appetite is not back to normal just yet. She is eating and drinking better. She is not having any chest pains or abdominal pain. No diarrhea or constipation. She is not having any more of the confusional episodes but not back to feeling 100%. She is having some headaches since leaving the hospital. They did stop several of her BP meds. They are not sure what she should be taking now since some of the medicines were stopped and her meds are somewhat confused.   Review of Systems  Constitutional: Positive for activity change and fatigue. Negative for fever, appetite change and unexpected weight change.  Respiratory: Negative for cough, chest tightness, shortness of breath and wheezing.   Cardiovascular: Negative for chest pain, palpitations and leg swelling.  Gastrointestinal: Negative for nausea, abdominal pain, diarrhea, constipation and abdominal distention.  Musculoskeletal: Negative.   Skin: Negative.   Neurological: Positive for weakness and headaches. Negative for dizziness, speech difficulty, light-headedness and numbness.  Psychiatric/Behavioral: Negative.       Objective:   Physical Exam  Constitutional: She is oriented to person, place, and time. She appears well-developed and well-nourished.  HENT:  Head: Normocephalic and atraumatic.  Eyes: EOM are normal. Pupils are equal, round, and reactive to light.  Neck: Normal range of motion.  Cardiovascular: Normal rate and regular rhythm.   Pulmonary/Chest: Effort normal and breath sounds normal. No respiratory distress. She has no wheezes. She has no rales.  Abdominal: Soft. Bowel sounds are normal. She exhibits no distension and no mass. There is no tenderness. There is no rebound and no guarding.   Musculoskeletal: She exhibits no edema.  Neurological: She is alert and oriented to person, place, and time. Coordination normal.  Skin: Skin is warm and dry.   Filed Vitals:   02/17/16 1410 02/17/16 1443  BP: 180/96 184/110  Pulse: 85   Temp: 98.5 F (36.9 C)   TempSrc: Oral   Resp: 16   Height: 4\' 10"  (1.473 m)   Weight: 141 lb 1.9 oz (64.012 kg)   SpO2: 96%       Assessment & Plan:  Pneumonia 23 shot given at visit.

## 2016-02-18 NOTE — Assessment & Plan Note (Signed)
Unclear if this was related to her symptoms. She was given 1 dose of antibiotics and then not treated.

## 2016-02-18 NOTE — Assessment & Plan Note (Signed)
Checking labs today, she did not take the potassium supplement called in to the pharmacy for her after last visit.

## 2016-02-18 NOTE — Assessment & Plan Note (Signed)
Weight is up about 2 pounds since last visit. Still not back to her usual but she is eating and drinking well and no more nausea.

## 2016-02-18 NOTE — Assessment & Plan Note (Signed)
Appears to be resolved and unclear etiology still. It is unclear that the bacturia was related.

## 2016-02-18 NOTE — Assessment & Plan Note (Signed)
BP is moderately elevated due to several of her BP meds stopped instead of held while sick. She does need more BP meds and is having headaches. Restart losartan 100 mg daily. Continue her hydralazine. Check back soon and if needed can restart her other BP med as well.

## 2016-02-19 ENCOUNTER — Telehealth: Payer: Self-pay

## 2016-02-19 DIAGNOSIS — H353221 Exudative age-related macular degeneration, left eye, with active choroidal neovascularization: Secondary | ICD-10-CM | POA: Diagnosis not present

## 2016-02-19 NOTE — Telephone Encounter (Signed)
Is she having chest pain or dizziness or severe headache? If so then she should go to ER. If not then we should give the medication changes about 1 week to take effect and if still running high we need to adjust.

## 2016-02-19 NOTE — Telephone Encounter (Signed)
Patients daughter called and said patients bp 189/95. She wants to know if she should be doing something different. Please follow up, Thank you.

## 2016-02-19 NOTE — Telephone Encounter (Signed)
Spoke with patient's granddaughter and she will take the patient to the ER if she starts having those symptoms. As of right now she isn't having any symptoms. They will continue to monitor.

## 2016-03-18 DIAGNOSIS — H353221 Exudative age-related macular degeneration, left eye, with active choroidal neovascularization: Secondary | ICD-10-CM | POA: Diagnosis not present

## 2016-03-24 ENCOUNTER — Other Ambulatory Visit: Payer: Self-pay | Admitting: Internal Medicine

## 2016-04-07 ENCOUNTER — Telehealth: Payer: Self-pay

## 2016-04-07 ENCOUNTER — Ambulatory Visit (INDEPENDENT_AMBULATORY_CARE_PROVIDER_SITE_OTHER): Payer: Medicare Other

## 2016-04-07 VITALS — BP 160/100 | HR 83 | Ht <= 58 in | Wt 146.0 lb

## 2016-04-07 DIAGNOSIS — Z0000 Encounter for general adult medical examination without abnormal findings: Secondary | ICD-10-CM | POA: Diagnosis not present

## 2016-04-07 DIAGNOSIS — Z780 Asymptomatic menopausal state: Secondary | ICD-10-CM | POA: Diagnosis not present

## 2016-04-07 NOTE — Progress Notes (Signed)
Subjective:   Stacy Page is a 82 y.o. female who presents for Medicare Annual (Subsequent) preventive examination.  Here for [facility]   Lifestyle: mother Alz and HTN; Stroke; Father HTN and MI HTN last visit but had stopped BP meds with recent period of illness  She is not diabetic; has HTN    Tobacco: former smoker; Quit 39 ;36 years ago   ETOH: none   BMI: 29/ now 30.5 today   Diet Ensure in am Eats egg and bacon; OJ, coffee Lunch apple sauce; sandwich Peanut butter and jelly  Dinner; 2 to 3 chicken wings; Let her grand son pick up from [employer];  Baked fish Vegetables every day; broccoli salad   Exercise Started back doing chair exercises Can sit on the floor and get back up Can touch toes; educated that this may place her at risk for falls Educated using bands that could achieve the same thing and to consider safety first  Has watching sittercise on tv  Get and go is good but states she does sit around at home   Not as social as she was;  Not driving; but then admits she does drive some but now when she is dizzy  Stopped driving; stopped when dizziness Will leave with resources on transportation and information on adult center for enrichment The dtr feels like and adult program would be very beneficial for her mother   Medication: Restarted Losartan 100   SAFETY; lives in home;  May consider housing that is more resource available with independent living  Generalized Safety in the home reviewed/  Removing clutter and tripping hazzards, railings on stairs and grab bars in the bathroom; good lighting;  Shower in tub; discuss shower transfer bench Urinary or fecal incontinence reviewed' thinks she may have some softer stools due to ensure Fall prevention provided in writing  Review Firearm safety as appropriate;  Smoke detectors; yes Assessed for community safety; YES dtr lives with her  Driving: does not drive if dizzy Sun protection/does not go in the  sun   Hearing; Adequate; hears good on the phone  Ophthalmology; Apt with another / Dr. Katy Fitch, [facility]  Macular in left eye; treated x 1 per month; Dr. Radene Ou  Overdue Screens; DEXA  Agreed to have one at [facility] when she gets her mammogram in October Will postpone until Nov   Other Screens Colonoscopy 2011; aged out Pap; Hyst  Mammogram 05/2015; every year at [facility];  Can schedule Dexa; external facility   Treatment Team reviewed:  Cardiac Risk Factors include: advanced age (>88men, >41 women);dyslipidemia;hypertension     Objective:     Vitals: BP (!) 160/100   Pulse 83   Ht 4\' 10"  (1.473 m)   Wt 146 lb (66.2 kg)   SpO2 97%   BMI 30.51 kg/m   Body mass index is 30.51 kg/m.   Tobacco History  Smoking Status  . Former Smoker  . Types: Cigarettes  . Quit date: 08/24/1979  Smokeless Tobacco  . Never Used    Comment: smoked  1966-1981, up to 1 ppd     Counseling given: Yes   Past Medical History:  Diagnosis Date  . Allergy    seasonal  . Asthma   . GERD (gastroesophageal reflux disease)   . Hyperlipidemia   . Hypertension   . Transient cerebral ischemia    Past Surgical History:  Procedure Laterality Date  . ABDOMINAL HYSTERECTOMY     for fibroids (no BSO)  . BUNIONECTOMY Right   .  CARPAL TUNNEL RELEASE     Bilaterally  . COLONOSCOPY  2011   tiny polyp ; [facility]  . Macular Degeneration     Dr Zadie Rhine  ; injections  . UMBILICAL HERNIA REPAIR     Family History  Problem Relation Age of Onset  . Alzheimer's disease Mother   . Hypertension Mother   . Stroke Mother 28  . Hypertension Father   . Heart attack Father     in 80's  . Hypertension Maternal Grandmother   . Heart attack Maternal Grandmother 73  . Diabetes      1/2 sister  . Colon cancer Neg Hx   . Esophageal cancer Neg Hx   . Pancreatic cancer Neg Hx   . Kidney disease Neg Hx   . Liver disease Neg Hx    History  Sexual Activity  . Sexual activity: Not on file    Outpatient  Encounter Prescriptions as of 04/07/2016  Medication Sig  . atorvastatin (LIPITOR) 40 MG tablet Take half tablet (20 mg total) by mouth on Mondays, Wednesdays, Fridays and Sundays.  Marland Kitchen azelastine (ASTELIN) 0.1 % nasal spray Place 2 sprays into both nostrils at bedtime as needed. (allergies)  . clopidogrel (PLAVIX) 75 MG tablet Take 1 tablet (75 mg total) by mouth daily.  Marland Kitchen co-enzyme Q-10 30 MG capsule Take 30 mg by mouth daily.   . cycloSPORINE (RESTASIS) 0.05 % ophthalmic emulsion Place 1 drop into both eyes 2 (two) times daily.  . feeding supplement, ENSURE ENLIVE, (ENSURE ENLIVE) LIQD Take 237 mLs by mouth 2 (two) times daily between meals.  . fluticasone (FLONASE) 50 MCG/ACT nasal spray Place 2 sprays into both nostrils daily as needed for allergies or rhinitis.  Marland Kitchen gabapentin (NEURONTIN) 100 MG capsule TAKE 1 CAPSULE BY MOUTH 3  TIMES DAILY EVERY 8 HOURS  AS NEEDED.  . hydrALAZINE (APRESOLINE) 100 MG tablet Take 0.5 tablets (50 mg total) by mouth 3 (three) times daily.  Marland Kitchen levocetirizine (XYZAL) 5 MG tablet Take 1 tablet (5 mg total) by mouth every evening.  Marland Kitchen LORazepam (ATIVAN) 0.5 MG tablet Take 1 tablet (0.5 mg total) by mouth daily as needed for anxiety.  Marland Kitchen losartan (COZAAR) 100 MG tablet Take 1 tablet (100 mg total) by mouth daily.  . meclizine (ANTIVERT) 25 MG tablet Take 0.5-1 tablets (12.5-25 mg total) by mouth 3 (three) times daily as needed for dizziness.  . meclizine (ANTIVERT) 25 MG tablet TAKE 1/2 TO 1 TABLET BY  MOUTH 3 TIMES DAILY AS  NEEDED FOR DIZZINESS OR  NAUSEA  . montelukast (SINGULAIR) 10 MG tablet Take 10 mg by mouth daily as needed. Reported on 02/17/2016  . pantoprazole (PROTONIX) 20 MG tablet Take 1 tablet by mouth  daily  . albuterol (PROVENTIL HFA;VENTOLIN HFA) 108 (90 Base) MCG/ACT inhaler Inhale 1-2 puffs into the lungs every 4 (four) hours as needed for wheezing or shortness of breath.   No facility-administered encounter medications on file as of 04/07/2016.      Activities of Daily Living In your present state of health, do you have any difficulty performing the following activities: 04/07/2016 01/30/2016  Hearing? N N  Vision? N N  Difficulty concentrating or making decisions? N N  Walking or climbing stairs? Y Y  Dressing or bathing? N N  Doing errands, shopping? N Y  Conservation officer, nature and eating ? N -  Using the Toilet? N -  In the past six months, have you accidently leaked urine? N -  Do  you have problems with loss of bowel control? Y -  Managing your Medications? N -  Managing your Finances? N -  Housekeeping or managing your Housekeeping? N -  Some recent data might be hidden    Patient Care Team: Hoyt Koch, MD as PCP - General (Internal Medicine)    Assessment:      Exercise Activities and Dietary recommendations Current Exercise Habits: Home exercise routine, Time (Minutes): 15, Frequency (Times/Week): 2, Weekly Exercise (Minutes/Week): 30, Intensity: Mild  Goals    . patient          Socialize more  [employer]; [phone] [address]; [phone]  Possible community housing solutions to put up handrails   Ask about the [facility] or the [facility];   [employer]; [phone] Management consultant; Information regarding Long Term Care  For safety consider chair in tub; Or shower transfer bench                 Fall Risk Fall Risk  04/07/2016 01/30/2016  Falls in the past year? No No   Educated on the risk of "bending over" for exercises and given alternative exercises that will achieve the same result but are safer.   Depression Screen PHQ 2/9 Scores 04/07/2016 01/30/2016  PHQ - 2 Score 0 0     Cognitive Testing MMSE - Mini Mental State Exam 04/07/2016  Orientation to time 5  Orientation to Place 5  Registration 3  Attention/ Calculation 1  Recall 2  Language- name 2 objects 2  Language- repeat 1  Language- follow 3 step  command 3  Language- read & follow direction 1  Write a sentence 1  Copy design 1  Total score 25   No concerns for independent living at present. Dtr managing meds but the patient aware of what she is taking and how often.  Still driving but limited.  Immunization History  Administered Date(s) Administered  . Influenza Split 04/23/2012  . Influenza Whole 06/14/2007, 05/23/2008, 05/26/2009, 05/13/2010  . Influenza-Unspecified 04/24/2014, 05/24/2015  . Pneumococcal Conjugate-13 04/24/2014  . Pneumococcal Polysaccharide-23 02/17/2016  . Tdap 11/16/2010  . Zoster 12/17/2012   Screening Tests Health Maintenance  Topic Date Due  . DEXA SCAN  03/08/2000  . INFLUENZA VACCINE  03/23/2016  . TETANUS/TDAP  11/15/2020  . ZOSTAVAX  Completed  . PNA vac Low Risk Adult  Completed      Plan:   Given a copy of Advanced Directives and explained to the family  Given resources for socialization in the community Recommended a shower transfer bench for her safety   Stopped Lasix on the hospital  Was on 40mg  and has not restarted but ankles had mild edema in them today  Also stated she is taking her Losartan but did not bring it in today. Asked her to check her Bp every day and has apt in Sept with Dr. Sharlet Salina Wants her Lipids drawn. Given information on cholesterol   During the course of the visit the patient was educated and counseled about the following appropriate screening and preventive services:   Vaccines to include Pneumoccal, Influenza, Hepatitis B, Td, Zostavax, HCV  Electrocardiogram  Cardiovascular Disease  Colorectal cancer screening aged out  Bone density screening- to repeat with mammogram in Oct   Diabetes screening/n/a  Glaucoma screening / eye apt next week Dr. Katy Fitch  Mammography/oct 2017 tbs  Nutrition counseling / stop ensure and see if mild changes in stool stop. Try drinking 1/2  container; may be to rich  Patient Instructions (the written plan) was  given to the patient.   Wynetta Fines, RN  04/07/2016

## 2016-04-07 NOTE — Telephone Encounter (Signed)
fup from [facility]V 08/16  Dr. Sharlet Salina,  Ms brien brought her meds in but did not have her losartan. Stated she is taking it. Her bP 160/100  Stated she has not started her lasix 40 but feels her ankles were more swollen; had mild edema today  Seemed to be knowledgeable about her meds but MMSE was 25/30 but issues was with numbers.  Please advise regarding lasix.  tks,

## 2016-04-07 NOTE — Patient Instructions (Addendum)
Stacy Page , Thank you for taking time to come for your Medicare Wellness Visit. I appreciate your ongoing commitment to your health goals. Please review the following plan we discussed and let me know if I can assist you in the future.   [place] offers free advance directive forms, as well as assistance in completing the forms themselves.  For assistance, contact the Spiritual Care Department at [phone], or the Clinical Social Work Department at [phone].   Let your dtr check bp every day but at different times  Will schedule DEXA (bone density) at [facility] when you have your mamogram    These are the goals we discussed: Goals    . patient          Socialize more  [employer]; [phone] Sr[address] [place]; [phone]  Possible community housing solutions to put up handrails   Ask about the [facility] or the [facility];   [employer]; [phone] Management consultant; Information regarding Long Term Care  For safety consider chair in tub; Or shower transfer bench                  This is a list of the screening recommended for you and due dates:  Health Maintenance  Topic Date Due  . DEXA scan (bone density measurement)  03/08/2000  . Flu Shot  03/23/2016  . Tetanus Vaccine  11/15/2020  . Shingles Vaccine  Completed  . Pneumonia vaccines  Completed   ]    Fall Prevention in the Home  Falls can cause injuries. They can happen to people of all ages. There are many things you can do to make your home safe and to help prevent falls.  WHAT CAN I DO ON THE OUTSIDE OF MY HOME?  Regularly fix the edges of walkways and driveways and fix any cracks.  Remove anything that might make you trip as you walk through a door, such as a raised step or threshold.  Trim any bushes or trees on the path to your home.  Use bright outdoor lighting.  Clear any walking paths of anything that might make  someone trip, such as rocks or tools.  Regularly check to see if handrails are loose or broken. Make sure that both sides of any steps have handrails.  Any raised decks and porches should have guardrails on the edges.  Have any leaves, snow, or ice cleared regularly.  Use sand or salt on walking paths during winter.  Clean up any spills in your garage right away. This includes oil or grease spills. WHAT CAN I DO IN THE BATHROOM?   Use night lights.  Install grab bars by the toilet and in the tub and shower. Do not use towel bars as grab bars.  Use non-skid mats or decals in the tub or shower.  If you need to sit down in the shower, use a plastic, non-slip stool.  Keep the floor dry. Clean up any water that spills on the floor as soon as it happens.  Remove soap buildup in the tub or shower regularly.  Attach bath mats securely with double-sided non-slip rug tape.  Do not have throw rugs and other things on the floor that can make you trip. WHAT CAN I DO IN THE BEDROOM?  Use night lights.  Make sure that you have a light by your bed that is easy to reach.  Do not use any sheets or blankets that are too big for  your bed. They should not hang down onto the floor.  Have a firm chair that has side arms. You can use this for support while you get dressed.  Do not have throw rugs and other things on the floor that can make you trip. WHAT CAN I DO IN THE KITCHEN?  Clean up any spills right away.  Avoid walking on wet floors.  Keep items that you use a lot in easy-to-reach places.  If you need to reach something above you, use a strong step stool that has a grab bar.  Keep electrical cords out of the way.  Do not use floor polish or wax that makes floors slippery. If you must use wax, use non-skid floor wax.  Do not have throw rugs and other things on the floor that can make you trip. WHAT CAN I DO WITH MY STAIRS?  Do not leave any items on the stairs.  Make sure that  there are handrails on both sides of the stairs and use them. Fix handrails that are broken or loose. Make sure that handrails are as long as the stairways.  Check any carpeting to make sure that it is firmly attached to the stairs. Fix any carpet that is loose or worn.  Avoid having throw rugs at the top or bottom of the stairs. If you do have throw rugs, attach them to the floor with carpet tape.  Make sure that you have a light switch at the top of the stairs and the bottom of the stairs. If you do not have them, ask someone to add them for you. WHAT ELSE CAN I DO TO HELP PREVENT FALLS?  Wear shoes that:  Do not have high heels.  Have rubber bottoms.  Are comfortable and fit you well.  Are closed at the toe. Do not wear sandals.  If you use a stepladder:  Make sure that it is fully opened. Do not climb a closed stepladder.  Make sure that both sides of the stepladder are locked into place.  Ask someone to hold it for you, if possible.  Clearly mark and make sure that you can see:  Any grab bars or handrails.  First and last steps.  Where the edge of each step is.  Use tools that help you move around (mobility aids) if they are needed. These include:  Canes.  Walkers.  Scooters.  Crutches.  Turn on the lights when you go into a dark area. Replace any light bulbs as soon as they burn out.  Set up your furniture so you have a clear path. Avoid moving your furniture around.  If any of your floors are uneven, fix them.  If there are any pets around you, be aware of where they are.  Review your medicines with your doctor. Some medicines can make you feel dizzy. This can increase your chance of falling. Ask your doctor what other things that you can do to help prevent falls.   This information is not intended to replace advice given to you by your health care provider. Make sure you discuss any questions you have with your health care provider.   Document  Released: 06/05/2009 Document Revised: 12/24/2014 Document Reviewed: 09/13/2014 Elsevier Interactive Patient Education 2016 [place] Maintenance, Female Adopting a healthy lifestyle and getting preventive care can go a long way to promote health and wellness. Talk with your health care provider about what schedule of regular examinations is right for you. This is a  good chance for you to check in with your provider about disease prevention and staying healthy. In between checkups, there are plenty of things you can do on your own. Experts have done a lot of research about which lifestyle changes and preventive measures are most likely to keep you healthy. Ask your health care provider for more information. WEIGHT AND DIET  Eat a healthy diet  Be sure to include plenty of vegetables, fruits, low-fat dairy products, and lean protein.  Do not eat a lot of foods high in solid fats, added sugars, or salt.  Get regular exercise. This is one of the most important things you can do for your health.  Most adults should exercise for at least 150 minutes each week. The exercise should increase your heart rate and make you sweat (moderate-intensity exercise).  Most adults should also do strengthening exercises at least twice a week. This is in addition to the moderate-intensity exercise.  Maintain a healthy weight  Body mass index (BMI) is a measurement that can be used to identify possible weight problems. It estimates body fat based on height and weight. Your health care provider can help determine your BMI and help you achieve or maintain a healthy weight.  For females 89 years of age and older:   A BMI below 18.5 is considered underweight.  A BMI of 18.5 to 24.9 is normal.  A BMI of 25 to 29.9 is considered overweight.  A BMI of 30 and above is considered obese.  Watch levels of cholesterol and blood lipids  You should start having your blood tested for lipids and  cholesterol at 80 years of age, then have this test every 5 years.  You may need to have your cholesterol levels checked more often if:  Your lipid or cholesterol levels are high.  You are older than 80 years of age.  You are at high risk for heart disease.  CANCER SCREENING   Lung Cancer  Lung cancer screening is recommended for adults 90-70 years old who are at high risk for lung cancer because of a history of smoking.  A yearly low-dose CT scan of the lungs is recommended for people who:  Currently smoke.  Have quit within the past 15 years.  Have at least a 30-pack-year history of smoking. A pack year is smoking an average of one pack of cigarettes a day for 1 year.  Yearly screening should continue until it has been 15 years since you quit.  Yearly screening should stop if you develop a health problem that would prevent you from having lung cancer treatment.  Breast Cancer  Practice breast self-awareness. This means understanding how your breasts normally appear and feel.  It also means doing regular breast self-exams. Let your health care provider know about any changes, no matter how small.  If you are in your 20s or 30s, you should have a clinical breast exam (CBE) by a health care provider every 1-3 years as part of a regular health exam.  If you are 11 or older, have a CBE every year. Also consider having a breast X-ray (mammogram) every year.  If you have a family history of breast cancer, talk to your health care provider about genetic screening.  If you are at high risk for breast cancer, talk to your health care provider about having an MRI and a mammogram every year.  Breast cancer gene (BRCA) assessment is recommended for women who have family members with BRCA-related cancers. BRCA-related  cancers include:  Breast.  Ovarian.  Tubal.  Peritoneal cancers.  Results of the assessment will determine the need for genetic counseling and BRCA1 and BRCA2  testing. Cervical Cancer Your health care provider may recommend that you be screened regularly for cancer of the pelvic organs (ovaries, uterus, and vagina). This screening involves a pelvic examination, including checking for microscopic changes to the surface of your cervix (Pap test). You may be encouraged to have this screening done every 3 years, beginning at age 19.  For women ages 54-65, health care providers may recommend pelvic exams and Pap testing every 3 years, or they may recommend the Pap and pelvic exam, combined with testing for human papilloma virus (HPV), every 5 years. Some types of HPV increase your risk of cervical cancer. Testing for HPV may also be done on women of any age with unclear Pap test results.  Other health care providers may not recommend any screening for nonpregnant women who are considered low risk for pelvic cancer and who do not have symptoms. Ask your health care provider if a screening pelvic exam is right for you.  If you have had past treatment for cervical cancer or a condition that could lead to cancer, you need Pap tests and screening for cancer for at least 20 years after your treatment. If Pap tests have been discontinued, your risk factors (such as having a new sexual partner) need to be reassessed to determine if screening should resume. Some women have medical problems that increase the chance of getting cervical cancer. In these cases, your health care provider may recommend more frequent screening and Pap tests. Colorectal Cancer  This type of cancer can be detected and often prevented.  Routine colorectal cancer screening usually begins at 80 years of age and continues through 80 years of age.  Your health care provider may recommend screening at an earlier age if you have risk factors for colon cancer.  Your health care provider may also recommend using home test kits to check for hidden blood in the stool.  A small camera at the end of a  tube can be used to examine your colon directly (sigmoidoscopy or colonoscopy). This is done to check for the earliest forms of colorectal cancer.  Routine screening usually begins at age 39.  Direct examination of the colon should be repeated every 5-10 years through 80 years of age. However, you may need to be screened more often if early forms of precancerous polyps or small growths are found. Skin Cancer  Check your skin from head to toe regularly.  Tell your health care provider about any new moles or changes in moles, especially if there is a change in a mole's shape or color.  Also tell your health care provider if you have a mole that is larger than the size of a pencil eraser.  Always use sunscreen. Apply sunscreen liberally and repeatedly throughout the day.  Protect yourself by wearing long sleeves, pants, a wide-brimmed hat, and sunglasses whenever you are outside. HEART DISEASE, DIABETES, AND HIGH BLOOD PRESSURE   High blood pressure causes heart disease and increases the risk of stroke. High blood pressure is more likely to develop in:  People who have blood pressure in the high end of the normal range (130-139/85-89 mm Hg).  People who are overweight or obese.  People who are African American.  If you are 57-42 years of age, have your blood pressure checked every 3-5 years. If you are 40  years of age or older, have your blood pressure checked every year. You should have your blood pressure measured twice--once when you are at a hospital or clinic, and once when you are not at a hospital or clinic. Record the average of the two measurements. To check your blood pressure when you are not at a hospital or clinic, you can use:  An automated blood pressure machine at a pharmacy.  A home blood pressure monitor.  If you are between 30 years and 70 years old, ask your health care provider if you should take aspirin to prevent strokes.  Have regular diabetes screenings. This  involves taking a blood sample to check your fasting blood sugar level.  If you are at a normal weight and have a low risk for diabetes, have this test once every three years after 80 years of age.  If you are overweight and have a high risk for diabetes, consider being tested at a younger age or more often. PREVENTING INFECTION  Hepatitis B  If you have a higher risk for hepatitis B, you should be screened for this virus. You are considered at high risk for hepatitis B if:  You were born in a country where hepatitis B is common. Ask your health care provider which countries are considered high risk.  Your parents were born in a high-risk country, and you have not been immunized against hepatitis B (hepatitis B vaccine).  You have HIV or AIDS.  You use needles to inject street drugs.  You live with someone who has hepatitis B.  You have had sex with someone who has hepatitis B.  You get hemodialysis treatment.  You take certain medicines for conditions, including cancer, organ transplantation, and autoimmune conditions. Hepatitis C  Blood testing is recommended for:  Everyone born from [birth year] through 1963.  Anyone with known risk factors for hepatitis C. Sexually transmitted infections (STIs)  You should be screened for sexually transmitted infections (STIs) including gonorrhea and chlamydia if:  You are sexually active and are younger than 80 years of age.  You are older than 80 years of age and your health care provider tells you that you are at risk for this type of infection.  Your sexual activity has changed since you were last screened and you are at an increased risk for chlamydia or gonorrhea. Ask your health care provider if you are at risk.  If you do not have HIV, but are at risk, it may be recommended that you take a prescription medicine daily to prevent HIV infection. This is called pre-exposure prophylaxis (PrEP). You are considered at risk if:  You are  sexually active and do not regularly use condoms or know the HIV status of your partner(s).  You take drugs by injection.  You are sexually active with a partner who has HIV. Talk with your health care provider about whether you are at high risk of being infected with HIV. If you choose to begin PrEP, you should first be tested for HIV. You should then be tested every 3 months for as long as you are taking PrEP.  PREGNANCY   If you are premenopausal and you may become pregnant, ask your health care provider about preconception counseling.  If you may become pregnant, take 400 to 800 micrograms (mcg) of folic acid every day.  If you want to prevent pregnancy, talk to your health care provider about birth control (contraception). OSTEOPOROSIS AND MENOPAUSE   Osteoporosis is a disease in  which the bones lose minerals and strength with aging. This can result in serious bone fractures. Your risk for osteoporosis can be identified using a bone density scan.  If you are 71 years of age or older, or if you are at risk for osteoporosis and fractures, ask your health care provider if you should be screened.  Ask your health care provider whether you should take a calcium or vitamin D supplement to lower your risk for osteoporosis.  Menopause may have certain physical symptoms and risks.  Hormone replacement therapy may reduce some of these symptoms and risks. Talk to your health care provider about whether hormone replacement therapy is right for you.  HOME CARE INSTRUCTIONS   Schedule regular health, dental, and eye exams.  Stay current with your immunizations.   Do not use any tobacco products including cigarettes, chewing tobacco, or electronic cigarettes.  If you are pregnant, do not drink alcohol.  If you are breastfeeding, limit how much and how often you drink alcohol.  Limit alcohol intake to no more than 1 drink per day for nonpregnant women. One drink equals 12 ounces of beer, 5  ounces of wine, or 1 ounces of hard liquor.  Do not use street drugs.  Do not share needles.  Ask your health care provider for help if you need support or information about quitting drugs.  Tell your health care provider if you often feel depressed.  Tell your health care provider if you have ever been abused or do not feel safe at home.   This information is not intended to replace advice given to you by your health care provider. Make sure you discuss any questions you have with your health care provider.   Document Released: 02/22/2011 Document Revised: 08/30/2014 Document Reviewed: 07/11/2013 Elsevier Interactive Patient Education [employer].

## 2016-04-08 NOTE — Telephone Encounter (Signed)
Would advise to start for BP control.

## 2016-04-08 NOTE — Progress Notes (Signed)
Medical screening examination/treatment/procedure(s) were performed by non-physician practitioner and as supervising physician I was immediately available for consultation/collaboration. I agree with above. Greg Cratty A Riad Wagley, MD 

## 2016-04-09 NOTE — Telephone Encounter (Signed)
Thanks

## 2016-04-09 NOTE — Telephone Encounter (Signed)
Call to [name];  Stated her BP was still elevated 160/100 yesterday. Agreed to restart the lasix as ordered. Taking Losartan 100 qd Will continue the Hydralazine 100mg  /0.5 tab 3 times daily   Will continue to check bp and if it remains elevated in a week to fup with Dr. Sharlet Salina. Stated she will do so

## 2016-04-12 DIAGNOSIS — H16223 Keratoconjunctivitis sicca, not specified as Sjogren's, bilateral: Secondary | ICD-10-CM | POA: Diagnosis not present

## 2016-04-12 DIAGNOSIS — H25813 Combined forms of age-related cataract, bilateral: Secondary | ICD-10-CM | POA: Diagnosis not present

## 2016-04-12 DIAGNOSIS — H353222 Exudative age-related macular degeneration, left eye, with inactive choroidal neovascularization: Secondary | ICD-10-CM | POA: Diagnosis not present

## 2016-04-22 DIAGNOSIS — H353221 Exudative age-related macular degeneration, left eye, with active choroidal neovascularization: Secondary | ICD-10-CM | POA: Diagnosis not present

## 2016-04-22 DIAGNOSIS — H353121 Nonexudative age-related macular degeneration, left eye, early dry stage: Secondary | ICD-10-CM | POA: Diagnosis not present

## 2016-05-12 ENCOUNTER — Other Ambulatory Visit: Payer: Self-pay | Admitting: Internal Medicine

## 2016-05-14 ENCOUNTER — Encounter: Payer: Self-pay | Admitting: Internal Medicine

## 2016-05-18 ENCOUNTER — Ambulatory Visit: Payer: Medicare Other | Admitting: Internal Medicine

## 2016-05-19 ENCOUNTER — Ambulatory Visit (INDEPENDENT_AMBULATORY_CARE_PROVIDER_SITE_OTHER): Payer: Medicare Other | Admitting: Internal Medicine

## 2016-05-19 ENCOUNTER — Encounter: Payer: Self-pay | Admitting: Internal Medicine

## 2016-05-19 VITALS — BP 190/110 | Temp 97.0°F | Ht <= 58 in | Wt 145.0 lb

## 2016-05-19 DIAGNOSIS — I10 Essential (primary) hypertension: Secondary | ICD-10-CM | POA: Diagnosis not present

## 2016-05-19 MED ORDER — LOSARTAN POTASSIUM-HCTZ 100-25 MG PO TABS: 1.0000 | ORAL_TABLET | Freq: Every day | ORAL | 3 refills | Status: DC

## 2016-05-19 NOTE — Assessment & Plan Note (Addendum)
She is not taking her meds as prescribed. Some new ankle edema from being off her lasix. Kidney function normal so will change losartan (which she is not taking) to losartan/hctz and continue hydralazine. She will monitor at home and return for labs in 1 month. If she develops symptoms she will call the office or go to ER.

## 2016-05-19 NOTE — Progress Notes (Signed)
   Subjective:    Patient ID: Stacy Page, female    DOB: 22-Sep-1932, 82 y.o.   MRN: [id number]  HPI The patient is an 83 YO female coming in for follow up of her blood pressure. She is not taking her losartan she does not think. She is only taking hydralazine. She denies having headaches or chest pains at home. She is not checking her BP at home although we did ask her to do this.She is having some mild ankle swelling since she has been off her lasix.    Review of Systems  Constitutional: Positive for activity change. Negative for appetite change, fatigue, fever and unexpected weight change.  Respiratory: Negative for cough, chest tightness, shortness of breath and wheezing.   Cardiovascular: Negative for chest pain, palpitations and leg swelling.  Gastrointestinal: Negative for abdominal distention, abdominal pain, constipation, diarrhea and nausea.  Musculoskeletal: Negative.   Skin: Negative.   Neurological: Negative for dizziness, speech difficulty, weakness, light-headedness, numbness and headaches.      Objective:   Physical Exam  Constitutional: She is oriented to person, place, and time. She appears well-developed and well-nourished.  HENT:  Head: Normocephalic and atraumatic.  Eyes: EOM are normal. Pupils are equal, round, and reactive to light.  Neck: Normal range of motion.  Cardiovascular: Normal rate and regular rhythm.   Pulmonary/Chest: Effort normal and breath sounds normal. No respiratory distress. She has no wheezes. She has no rales.  Abdominal: Soft. She exhibits no distension. There is no tenderness. There is no rebound.  Musculoskeletal: She exhibits no edema.  Neurological: She is alert and oriented to person, place, and time. Coordination normal.  Skin: Skin is warm and dry.   Vitals:   05/19/16 1036 05/19/16 1107  BP: (!) 185/110 (!) 190/110  Temp: 97 F (36.1 C)   SpO2: 98%   Weight: 145 lb (65.8 kg)   Height: 4\' 10"  (1.473 m)       Assessment &  Plan:

## 2016-05-19 NOTE — Progress Notes (Signed)
Pre visit review using our clinic review tool, if applicable. No additional management support is needed unless otherwise documented below in the visit note. 

## 2016-05-19 NOTE — Patient Instructions (Addendum)
We have sent in a medicine called losartan/hctz which is the losartan and the fluid pill in one medicine. Take 1 pill daily.   Once you get the new medicine stop taking the losartan.   Keep taking the hydralazine. Start the new medicine losartan/hctz when you get it.   Once you start the new medicine come down to the lab about 1 month later. It is open 7:30 to 5 and you do not need an appointment. It does not have to be fasting.   Check the blood pressure at home and if it is still high please call the office. If it is >160 for the top number or >90 for the bottom number.

## 2016-05-24 DIAGNOSIS — H353221 Exudative age-related macular degeneration, left eye, with active choroidal neovascularization: Secondary | ICD-10-CM | POA: Diagnosis not present

## 2016-06-28 DIAGNOSIS — H353221 Exudative age-related macular degeneration, left eye, with active choroidal neovascularization: Secondary | ICD-10-CM | POA: Diagnosis not present

## 2016-07-26 DIAGNOSIS — J4520 Mild intermittent asthma, uncomplicated: Secondary | ICD-10-CM | POA: Diagnosis not present

## 2016-07-26 DIAGNOSIS — J3089 Other allergic rhinitis: Secondary | ICD-10-CM | POA: Diagnosis not present

## 2016-07-26 DIAGNOSIS — J0100 Acute maxillary sinusitis, unspecified: Secondary | ICD-10-CM | POA: Diagnosis not present

## 2016-07-26 DIAGNOSIS — J301 Allergic rhinitis due to pollen: Secondary | ICD-10-CM | POA: Diagnosis not present

## 2016-07-28 DIAGNOSIS — H353121 Nonexudative age-related macular degeneration, left eye, early dry stage: Secondary | ICD-10-CM | POA: Diagnosis not present

## 2016-07-28 DIAGNOSIS — H353221 Exudative age-related macular degeneration, left eye, with active choroidal neovascularization: Secondary | ICD-10-CM | POA: Diagnosis not present

## 2016-07-28 DIAGNOSIS — H353212 Exudative age-related macular degeneration, right eye, with inactive choroidal neovascularization: Secondary | ICD-10-CM | POA: Diagnosis not present

## 2016-07-28 DIAGNOSIS — H35352 Cystoid macular degeneration, left eye: Secondary | ICD-10-CM | POA: Diagnosis not present

## 2016-08-26 ENCOUNTER — Encounter (HOSPITAL_COMMUNITY): Payer: Self-pay | Admitting: Emergency Medicine

## 2016-08-26 ENCOUNTER — Emergency Department (HOSPITAL_COMMUNITY)
Admission: EM | Admit: 2016-08-26 | Discharge: 2016-08-26 | Disposition: A | Discharge status: Home / Self Care | Payer: Medicare Other | Attending: Emergency Medicine | Admitting: Emergency Medicine

## 2016-08-26 ENCOUNTER — Emergency Department (HOSPITAL_COMMUNITY): Payer: Medicare Other

## 2016-08-26 DIAGNOSIS — E876 Hypokalemia: Secondary | ICD-10-CM | POA: Diagnosis not present

## 2016-08-26 DIAGNOSIS — R739 Hyperglycemia, unspecified: Secondary | ICD-10-CM | POA: Diagnosis not present

## 2016-08-26 DIAGNOSIS — R0602 Shortness of breath: Secondary | ICD-10-CM | POA: Diagnosis not present

## 2016-08-26 DIAGNOSIS — Z8673 Personal history of transient ischemic attack (TIA), and cerebral infarction without residual deficits: Secondary | ICD-10-CM | POA: Insufficient documentation

## 2016-08-26 DIAGNOSIS — Z87891 Personal history of nicotine dependence: Secondary | ICD-10-CM | POA: Diagnosis not present

## 2016-08-26 DIAGNOSIS — R531 Weakness: Secondary | ICD-10-CM | POA: Insufficient documentation

## 2016-08-26 DIAGNOSIS — R002 Palpitations: Secondary | ICD-10-CM

## 2016-08-26 DIAGNOSIS — H353221 Exudative age-related macular degeneration, left eye, with active choroidal neovascularization: Secondary | ICD-10-CM | POA: Diagnosis not present

## 2016-08-26 DIAGNOSIS — I10 Essential (primary) hypertension: Secondary | ICD-10-CM | POA: Insufficient documentation

## 2016-08-26 DIAGNOSIS — J45909 Unspecified asthma, uncomplicated: Secondary | ICD-10-CM | POA: Diagnosis not present

## 2016-08-26 LAB — BASIC METABOLIC PANEL
ANION GAP: 10 (ref 5–15)
BUN: 19 mg/dL (ref 6–20)
CALCIUM: 8.9 mg/dL (ref 8.9–10.3)
CO2: 24 mmol/L (ref 22–32)
CREATININE: 0.9 mg/dL (ref 0.44–1.00)
Chloride: 102 mmol/L (ref 101–111)
GFR, EST NON AFRICAN AMERICAN: 58 mL/min — AB (ref >60)
Glucose, Bld: 150 mg/dL — ABNORMAL HIGH (ref 65–99)
Potassium: 3.1 mmol/L — ABNORMAL LOW (ref 3.5–5.1)
Sodium: 136 mmol/L (ref 135–145)

## 2016-08-26 LAB — CBC
HCT: 36.9 % (ref 36.0–46.0)
HEMOGLOBIN: 12.5 g/dL (ref 12.0–15.0)
MCH: 29.1 pg (ref 26.0–34.0)
MCHC: 33.9 g/dL (ref 30.0–36.0)
MCV: 86 fL (ref 78.0–100.0)
PLATELETS: 297 10*3/uL (ref 150–400)
RBC: 4.29 MIL/uL (ref 3.87–5.11)
RDW: 16.1 % — ABNORMAL HIGH (ref 11.5–15.5)
WBC: 6.7 10*3/uL (ref 4.0–10.5)

## 2016-08-26 LAB — I-STAT TROPONIN, ED
TROPONIN I, POC: 0 ng/mL (ref 0.00–0.08)
TROPONIN I, POC: 0.02 ng/mL (ref 0.00–0.08)

## 2016-08-26 LAB — PROTIME-INR
INR: 0.95
Prothrombin Time: 12.7 seconds (ref 11.4–15.2)

## 2016-08-26 MED ORDER — HYDRALAZINE HCL 50 MG PO TABS
50.0000 mg | ORAL_TABLET | Freq: Once | ORAL | Status: AC
  Administered 2016-08-26: 50 mg via ORAL
  Filled 2016-08-26: qty 1

## 2016-08-26 MED ORDER — POTASSIUM CHLORIDE CRYS ER 20 MEQ PO TBCR
40.0000 meq | EXTENDED_RELEASE_TABLET | Freq: Once | ORAL | Status: AC
  Administered 2016-08-26: 40 meq via ORAL
  Filled 2016-08-26: qty 2

## 2016-08-26 NOTE — Discharge Instructions (Signed)
Call Dr. Dahlia Bailiff office tomorrow to let him know if today's visits here in the symptoms that occurred after your injection earlier today. Also called Dr. Sharlet Salina tomorrow to schedule appointment in her office within a week. Your blood sugar was elevated today at 150. Blood potassium was low at 3.1. Blood pressure was high at 178/83. Ask Dr. Sharlet Salina to order a test known his hemoglobin A1c to check you for diabetes. Return if your condition worsens for any reason

## 2016-08-26 NOTE — ED Provider Notes (Signed)
[facility] DEPT Provider Note   CSN: [id number] Arrival date & time: 08/26/16  1437     History   Chief Complaint Chief Complaint  Patient presents with  . Weakness  . Palpitations    HPI Elverta Shala is a 83 y.o. female.  HPI Patient developed sensation of racing heartbeat and generalized weakness onset 40 minutes after being injected withLucentis this morning for macular degeneration. Other associated symptoms include shortness of breath. Symptoms lasted 2 hours and resolve spontaneously. Presently asymptomatic. Past Medical History:  Diagnosis Date  . Allergy    seasonal  . Asthma   . GERD (gastroesophageal reflux disease)   . Hyperlipidemia   . Hypertension   . Transient cerebral ischemia     Patient Active Problem List   Diagnosis Date Noted  . Subacute confusional state 02/04/2016  . Loss of weight 02/04/2016  . Nausea without vomiting 01/31/2016  . Hypokalemia 01/30/2016  . Hyponatremia 01/30/2016  . Normocytic anemia 01/30/2016  . Bacteriuria, asymptomatic 01/30/2016  . Routine general medical examination at a health care facility 12/18/2014  . TIA (transient ischemic attack) 05/23/2014  . Accelerated hypertension 03/12/2014  . GERD 08/29/2008  . HYPERLIPIDEMIA 06/14/2007  . Asthma with allergic rhinitis without complication [id number]  . Allergic rhinitis 11/07/2006  . SPINAL STENOSIS 11/07/2006    Past Surgical History:  Procedure Laterality Date  . ABDOMINAL HYSTERECTOMY     for fibroids (no BSO)  . BUNIONECTOMY Right   . CARPAL TUNNEL RELEASE     Bilaterally  . COLONOSCOPY  2011   tiny polyp ; [facility]  . Macular Degeneration     Dr Zadie Rhine  ; injections  . UMBILICAL HERNIA REPAIR      OB History    No data available       Home Medications    Prior to Admission medications   Medication Sig Start Date End Date Taking? Authorizing Provider  albuterol (PROVENTIL HFA;VENTOLIN HFA) 108 (90 Base) MCG/ACT inhaler Inhale 1-2 puffs  into the lungs every 4 (four) hours as needed for wheezing or shortness of breath.    Historical Provider, MD  atorvastatin (LIPITOR) 40 MG tablet Take half tablet (20 mg total) by mouth on Mondays, Wednesdays, Fridays and Sundays.    Historical Provider, MD  atorvastatin (LIPITOR) 40 MG tablet TAKE 1/2 TABLET BY MOUTH  EVERY MONDAY, WEDNESDAY,  FRIDAY, AND SUNDAY 05/13/16   Hoyt Koch, MD  azelastine (ASTELIN) 0.1 % nasal spray Place 2 sprays into both nostrils at bedtime as needed. (allergies) 07/22/15   Historical Provider, MD  clopidogrel (PLAVIX) 75 MG tablet Take 1 tablet by mouth  daily 05/13/16   Hoyt Koch, MD  co-enzyme Q-10 30 MG capsule Take 30 mg by mouth daily.     Historical Provider, MD  cycloSPORINE (RESTASIS) 0.05 % ophthalmic emulsion Place 1 drop into both eyes 2 (two) times daily.    Historical Provider, MD  feeding supplement, ENSURE ENLIVE, (ENSURE ENLIVE) LIQD Take 237 mLs by mouth 2 (two) times daily between meals. 02/01/16   Belkys A Regalado, MD  fluticasone (FLONASE) 50 MCG/ACT nasal spray Place 2 sprays into both nostrils daily as needed for allergies or rhinitis.    Historical Provider, MD  FLUZONE HIGH-DOSE 0.5 ML SUSY  05/12/16   Historical Provider, MD  gabapentin (NEURONTIN) 100 MG capsule TAKE 1 CAPSULE BY MOUTH 3  TIMES DAILY EVERY 8 HOURS  AS NEEDED. 10/07/15   Hoyt Koch, MD  hydrALAZINE (APRESOLINE) 100 MG  tablet Take 0.5 tablets (50 mg total) by mouth 3 (three) times daily. 02/01/16   Belkys A Regalado, MD  levocetirizine (XYZAL) 5 MG tablet Take 1 tablet (5 mg total) by mouth every evening. 02/17/16   Hoyt Koch, MD  LORazepam (ATIVAN) 0.5 MG tablet Take 1 tablet (0.5 mg total) by mouth daily as needed for anxiety. 11/07/15   Rubbie Battiest, NP  losartan-hydrochlorothiazide (HYZAAR) 100-25 MG tablet Take 1 tablet by mouth daily. 05/19/16   Hoyt Koch, MD  meclizine (ANTIVERT) 25 MG tablet Take 0.5-1 tablets (12.5-25 mg  total) by mouth 3 (three) times daily as needed for dizziness. 04/03/15   Hoyt Koch, MD  meclizine (ANTIVERT) 25 MG tablet TAKE 1/2 TO 1 TABLET BY  MOUTH 3 TIMES DAILY AS  NEEDED FOR DIZZINESS OR  NAUSEA 03/25/16   Hoyt Koch, MD  montelukast (SINGULAIR) 10 MG tablet Take 10 mg by mouth daily as needed. Reported on 02/17/2016 07/22/15   Historical Provider, MD  pantoprazole (PROTONIX) 20 MG tablet Take 1 tablet by mouth  daily 02/18/16   Hoyt Koch, MD   Results for orders placed or performed during the hospital encounter of 0000000  Basic metabolic panel  Result Value Ref Range   Sodium 136 135 - 145 mmol/L   Potassium 3.1 (L) 3.5 - 5.1 mmol/L   Chloride 102 101 - 111 mmol/L   CO2 24 22 - 32 mmol/L   Glucose, Bld 150 (H) 65 - 99 mg/dL   BUN 19 6 - 20 mg/dL   Creatinine, Ser 0.90 0.44 - 1.00 mg/dL   Calcium 8.9 8.9 - 10.3 mg/dL   GFR calc non Af Amer 58 (L) >60 mL/min   GFR calc Af Amer >60 >60 mL/min   Anion gap 10 5 - 15  CBC  Result Value Ref Range   WBC 6.7 4.0 - 10.5 K/uL   RBC 4.29 3.87 - 5.11 MIL/uL   Hemoglobin 12.5 12.0 - 15.0 g/dL   HCT 36.9 36.0 - 46.0 %   MCV 86.0 78.0 - 100.0 fL   MCH 29.1 26.0 - 34.0 pg   MCHC 33.9 30.0 - 36.0 g/dL   RDW 16.1 (H) 11.5 - 15.5 %   Platelets 297 150 - 400 K/uL  Protime-INR (order if Patient is taking Coumadin / Warfarin)  Result Value Ref Range   Prothrombin Time 12.7 11.4 - 15.2 seconds   INR 0.95   I-stat troponin, ED  Result Value Ref Range   Troponin i, poc 0.00 0.00 - 0.08 ng/mL   Comment 3          I-stat troponin, ED  Result Value Ref Range   Troponin i, poc 0.02 0.00 - 0.08 ng/mL   Comment 3           Dg Chest 2 View  Result Date: 08/26/2016 CLINICAL DATA:  Shortness of breath, dizziness and weakness. EXAM: CHEST  2 VIEW COMPARISON:  01/30/2016 FINDINGS: The cardiac silhouette is mildly enlarged. Mediastinal contours appear intact. Tortuosity and calcific atherosclerotic disease of the aorta.  There is no evidence of focal airspace consolidation, pleural effusion or pneumothorax. Osseous structures are without acute abnormality. Soft tissues are grossly normal. IMPRESSION: No active cardiopulmonary disease. Calcific atherosclerotic disease of the aorta. Electronically Signed   By: Fidela Salisbury M.D.   On: 08/26/2016 15:39   Family History Family History  Problem Relation Age of Onset  . Alzheimer's disease Mother   . Hypertension Mother   .  Stroke Mother 71  . Hypertension Father   . Heart attack Father     in 16's  . Hypertension Maternal Grandmother   . Heart attack Maternal Grandmother 73  . Diabetes      1/2 sister  . Colon cancer Neg Hx   . Esophageal cancer Neg Hx   . Pancreatic cancer Neg Hx   . Kidney disease Neg Hx   . Liver disease Neg Hx     Social History Social History  Substance Use Topics  . Smoking status: Former Smoker    Types: Cigarettes    Quit date: 08/24/1979  . Smokeless tobacco: Never Used     Comment: smoked  1966-1981, up to 1 ppd  . Alcohol use No     Allergies   Bystolic [nebivolol hcl]; Aspirin; Cardizem [diltiazem hcl]; Amlodipine; Metoclopramide hcl; and Spironolactone   Review of Systems Review of Systems  Constitutional: Negative.   HENT: Negative.   Respiratory: Positive for shortness of breath.   Cardiovascular: Positive for palpitations.  Gastrointestinal: Negative.   Musculoskeletal: Negative.   Skin: Negative.   Neurological: Negative.   Psychiatric/Behavioral: Negative.   All other systems reviewed and are negative.    Physical Exam Updated Vital Signs BP 185/95   Pulse 86   Temp 99 F (37.2 C) (Oral)   Resp (!) 28   Ht 4\' 10"  (1.473 m)   Wt 147 lb 6 oz (66.8 kg)   SpO2 100%   BMI 30.80 kg/m   Physical Exam  Constitutional: She is oriented to person, place, and time. She appears well-developed and well-nourished.  HENT:  Head: Normocephalic and atraumatic.  Eyes: Conjunctivae are normal. Pupils  are equal, round, and reactive to light.  Neck: Neck supple. No tracheal deviation present. No thyromegaly present.  Cardiovascular: Normal rate and regular rhythm.   No murmur heard. Pulmonary/Chest: Effort normal and breath sounds normal.  Abdominal: Soft. Bowel sounds are normal. She exhibits no distension. There is no tenderness.  Musculoskeletal: Normal range of motion. She exhibits no edema or tenderness.  Neurological: She is alert and oriented to person, place, and time. Coordination normal.  Skin: Skin is warm and dry. No rash noted.  Psychiatric: She has a normal mood and affect.  Nursing note and vitals reviewed.    ED Treatments / Results  Labs (all labs ordered are listed, but only abnormal results are displayed) Labs Reviewed  BASIC METABOLIC PANEL - Abnormal; Notable for the following:       Result Value   Potassium 3.1 (*)    Glucose, Bld 150 (*)    GFR calc non Af Amer 58 (*)    All other components within normal limits  CBC - Abnormal; Notable for the following:    RDW 16.1 (*)    All other components within normal limits  PROTIME-INR  I-STAT TROPOININ, ED   Chest x-ray viewed by me EKG  EKG Interpretation  Date/Time:  Thursday August 26 2016 14:46:30 EST Ventricular Rate:  97 PR Interval:    QRS Duration: 90 QT Interval:  364 QTC Calculation: 463 R Axis:   -63 Text Interpretation:  Sinus rhythm Abnormal R-wave progression, early transition Inferior infarct, old Consider anterior infarct No significant change since last tracing Confirmed by Winfred Leeds  MD, Raymund Manrique [phone]) on 08/26/2016 8:53:53 PM       Radiology Dg Chest 2 View  Result Date: 08/26/2016 CLINICAL DATA:  Shortness of breath, dizziness and weakness. EXAM: CHEST  2 VIEW COMPARISON:  01/30/2016 FINDINGS: The cardiac silhouette is mildly enlarged. Mediastinal contours appear intact. Tortuosity and calcific atherosclerotic disease of the aorta. There is no evidence of focal airspace consolidation,  pleural effusion or pneumothorax. Osseous structures are without acute abnormality. Soft tissues are grossly normal. IMPRESSION: No active cardiopulmonary disease. Calcific atherosclerotic disease of the aorta. Electronically Signed   By: Fidela Salisbury M.D.   On: 08/26/2016 15:39    Procedures Procedures (including critical care time)  Medications Ordered in ED Medications - No data to display   Initial Impression / Assessment and Plan / ED Course  I have reviewed the triage vital signs and the nursing notes.  Pertinent labs & imaging results that were available during my care of the patient were reviewed by me and considered in my medical decision making (see chart for details).  Clinical Course   10 PM remains asymptomatic. She was administered hydralazine here which is her usual medication. I spoke with the hospital pharmacist who states that Lucentis is likely causative agent of patient's symptoms. I suggested the patient call her ophthalmologist tomorrow to let him know today's event however. I also feel that patient should follow up with PCP.    Final Clinical Impressions(s) / ED Diagnoses  Diagnosis #1 palpitations #2 hypertension #3 generalized weakness #4 hyperglycemia #5 hypokalemia Final diagnoses:  None    New Prescriptions New Prescriptions   No medications on file     Orlie Dakin, MD 08/26/16 2210

## 2016-08-26 NOTE — ED Triage Notes (Signed)
Pt reports weakness and palpitations since her eye doctor injected her eye with a medication 1000. Denies pain. Endorses SOB and dizziness. HR 98 in triage.

## 2016-09-01 ENCOUNTER — Other Ambulatory Visit (INDEPENDENT_AMBULATORY_CARE_PROVIDER_SITE_OTHER): Payer: Medicare Other

## 2016-09-01 ENCOUNTER — Encounter: Payer: Self-pay | Admitting: Internal Medicine

## 2016-09-01 ENCOUNTER — Ambulatory Visit (INDEPENDENT_AMBULATORY_CARE_PROVIDER_SITE_OTHER): Payer: Medicare Other | Admitting: Internal Medicine

## 2016-09-01 VITALS — BP 156/90 | HR 105 | Temp 98.3°F | Resp 12 | Ht <= 58 in | Wt 145.8 lb

## 2016-09-01 DIAGNOSIS — I10 Essential (primary) hypertension: Secondary | ICD-10-CM

## 2016-09-01 DIAGNOSIS — E876 Hypokalemia: Secondary | ICD-10-CM

## 2016-09-01 DIAGNOSIS — J3089 Other allergic rhinitis: Secondary | ICD-10-CM | POA: Diagnosis not present

## 2016-09-01 LAB — BASIC METABOLIC PANEL
BUN: 17 mg/dL (ref 6–23)
CHLORIDE: 106 meq/L (ref 96–112)
CO2: 28 meq/L (ref 19–32)
Calcium: 9.3 mg/dL (ref 8.4–10.5)
Creatinine, Ser: 0.85 mg/dL (ref 0.40–1.20)
GFR: 82.45 mL/min (ref >60.00)
Glucose, Bld: 82 mg/dL (ref 70–99)
POTASSIUM: 3.8 meq/L (ref 3.5–5.1)
SODIUM: 143 meq/L (ref 135–145)

## 2016-09-01 LAB — MAGNESIUM: Magnesium: 2.2 mg/dL (ref 1.5–2.5)

## 2016-09-01 NOTE — Progress Notes (Signed)
   Subjective:    Patient ID: Stacy Page, female    DOB: [birth year], 83 y.o.   MRN: [id number]  HPI The patient is an 83 YO female coming in for ER follow up (in for medication reaction to an eye injection lucentis with resolution of symptoms in the ER without intervention). She is still having some dizziness. This dizziness has been going on for a long time. She has spoken with her allergist about it. Feels like her head is full and sloshing. She was treated with antibiotics and an injection when she was there last time and this helped for a short time. She denies it getting worse with standing, eating, taking medication. She does not notice that anything makes it worse and only the antibiotics have made it better. She has a lot of sinus congestion which is chronic. She has tried calling the eye doctor to let them know about the reaction but they have not called her back yet. She has called twice.   Review of Systems  Constitutional: Negative for activity change, appetite change, fatigue, fever and unexpected weight change.  HENT: Positive for congestion, postnasal drip, rhinorrhea and sinus pressure. Negative for ear discharge, ear pain, sinus pain, sore throat and trouble swallowing.   Eyes: Negative.   Respiratory: Negative.   Cardiovascular: Negative.   Gastrointestinal: Negative.   Musculoskeletal: Negative.   Skin: Negative.   Neurological: Positive for dizziness. Negative for syncope, speech difficulty, weakness, light-headedness, numbness and headaches.  Psychiatric/Behavioral: Negative.       Objective:   Physical Exam  Constitutional: She is oriented to person, place, and time. She appears well-developed and well-nourished.  HENT:  Head: Normocephalic and atraumatic.  Right Ear: External ear normal.  Left Ear: External ear normal.  Oropharynx with redness and drainage, nose without crusting, no sinus pressure on exam.   Eyes: EOM are normal.  Neck: Normal range of motion.  No JVD present.  Cardiovascular: Normal rate and regular rhythm.   No murmur heard. Pulmonary/Chest: Effort normal and breath sounds normal.  Abdominal: Soft. She exhibits no distension. There is no tenderness. There is no rebound.  Musculoskeletal: She exhibits no edema.  Lymphadenopathy:    She has no cervical adenopathy.  Neurological: She is alert and oriented to person, place, and time. Coordination normal.  Skin: Skin is warm and dry.   Vitals:   09/01/16 0912 09/01/16 0938  BP: (!) 170/90 (!) 156/90  Pulse: (!) 105   Resp: 12   Temp: 98.3 F (36.8 C)   TempSrc: Oral   SpO2: 100%   Weight: 145 lb 12 oz (66.1 kg)   Height: 4\' 10"  (1.473 m)       Assessment & Plan:

## 2016-09-01 NOTE — Assessment & Plan Note (Signed)
She feels that this is causing her dizzy sensation and clinical picture fits. No signs of sinusitis today and asked her to return to her allergist to see if they can treat her more aggressively for the drainage to see if this helps her symptoms. No falls or syncope and she is careful.

## 2016-09-01 NOTE — Progress Notes (Signed)
Pre visit review using our clinic review tool, if applicable. No additional management support is needed unless otherwise documented below in the visit note. 

## 2016-09-01 NOTE — Patient Instructions (Addendum)
We will check the labs today. 

## 2016-09-01 NOTE — Assessment & Plan Note (Signed)
Checking potassium and magnesium today. Given replacement in the ER for her low potassium level but do not think it is affecting her current symptoms.

## 2016-09-01 NOTE — Assessment & Plan Note (Signed)
BP close to goal today and not elevated in the ER. Likely not related to her dizziness as symptoms do not match. Continue hydralazine, losartan/hctz.

## 2016-09-16 NOTE — Progress Notes (Signed)
Cardiology Office Note    Date:  09/17/2016   ID:  Stacy Page, DOB 1932-08-24, MRN [id number]  PCP:  Hoyt Koch, MD  Cardiologist: Sinclair Grooms, MD   Chief Complaint  Patient presents with  . Follow-up    BP    History of Present Illness:  Stacy Page is a 83 y.o. female  presents for essential hypertension, and multiple medication intolerances.   The patient had multi-provider escalation of antihypertensive therapy over the past year, culminating in hospitalization with severe hypokalemia, altered mental status, and relatively low blood pressures. This was preceded by a sensation of dizziness most of the time. No chest complaints or discomfort. No dyspnea. Since discharge from the hospital in June she has done relatively well. She has been followed by her primary physician, Dr. Sharlet Salina. Her medication regimen has been simplified. I agree that her BP target should be increased compared to previous intention of 150/90 or less. We should be happy less than 160/90 mmHg. She has multiple medication intolerances.  She has purchased a stationary bicycle. She uses this with caution and has no exertion related symptoms in the chest.    Past Medical History:  Diagnosis Date  . Allergy    seasonal  . Asthma   . GERD (gastroesophageal reflux disease)   . Hyperlipidemia   . Hypertension   . Transient cerebral ischemia     Past Surgical History:  Procedure Laterality Date  . ABDOMINAL HYSTERECTOMY     for fibroids (no BSO)  . BUNIONECTOMY Right   . CARPAL TUNNEL RELEASE     Bilaterally  . COLONOSCOPY  2011   tiny polyp ; [facility]  . Macular Degeneration     Dr Zadie Rhine  ; injections  . UMBILICAL HERNIA REPAIR      Current Medications: Outpatient Medications Prior to Visit  Medication Sig Dispense Refill  . albuterol (PROVENTIL HFA;VENTOLIN HFA) 108 (90 Base) MCG/ACT inhaler Inhale 1-2 puffs into the lungs every 4 (four) hours as needed for wheezing or  shortness of breath.    Marland Kitchen atorvastatin (LIPITOR) 40 MG tablet TAKE 1/2 TABLET BY MOUTH  EVERY MONDAY, WEDNESDAY,  FRIDAY, AND SUNDAY 26 tablet 3  . azelastine (ASTELIN) 0.1 % nasal spray Place 2 sprays into both nostrils at bedtime as needed. (allergies)    . clopidogrel (PLAVIX) 75 MG tablet Take 1 tablet by mouth  daily 90 tablet 3  . co-enzyme Q-10 30 MG capsule Take 30 mg by mouth daily.     . cycloSPORINE (RESTASIS) 0.05 % ophthalmic emulsion Place 1 drop into both eyes 2 (two) times daily.    . feeding supplement, ENSURE ENLIVE, (ENSURE ENLIVE) LIQD Take 237 mLs by mouth 2 (two) times daily between meals. 237 mL 12  . fluticasone (FLONASE) 50 MCG/ACT nasal spray Place 2 sprays into both nostrils daily as needed for allergies or rhinitis.    . hydrALAZINE (APRESOLINE) 100 MG tablet Take 0.5 tablets (50 mg total) by mouth 3 (three) times daily. 20 tablet 0  . levocetirizine (XYZAL) 5 MG tablet Take 1 tablet (5 mg total) by mouth every evening. 30 tablet 3  . LORazepam (ATIVAN) 0.5 MG tablet Take 1 tablet (0.5 mg total) by mouth daily as needed for anxiety. 30 tablet 0  . losartan-hydrochlorothiazide (HYZAAR) 100-25 MG tablet Take 1 tablet by mouth daily. 90 tablet 3  . meclizine (ANTIVERT) 25 MG tablet Take 0.5-1 tablets (12.5-25 mg total) by mouth 3 (three) times daily as  needed for dizziness. 45 tablet 3  . montelukast (SINGULAIR) 10 MG tablet Take 10 mg by mouth at bedtime. Reported on 02/17/2016    . pantoprazole (PROTONIX) 20 MG tablet Take 1 tablet by mouth  daily 90 tablet 3  . gabapentin (NEURONTIN) 100 MG capsule TAKE 1 CAPSULE BY MOUTH 3  TIMES DAILY EVERY 8 HOURS  AS NEEDED. (Patient not taking: Reported on 09/17/2016) 270 capsule 3  . meclizine (ANTIVERT) 25 MG tablet TAKE 1/2 TO 1 TABLET BY  MOUTH 3 TIMES DAILY AS  NEEDED FOR DIZZINESS OR  NAUSEA (Patient not taking: Reported on 09/17/2016) 180 tablet 0   No facility-administered medications prior to visit.      Allergies:    Bystolic [nebivolol hcl]; Aspirin; Cardizem [diltiazem hcl]; Amlodipine; Metoclopramide hcl; and Spironolactone   Social History   Social History  . Marital status: Divorced    Spouse name: N/A  . Number of children: N/A  . Years of education: N/A   Social History Main Topics  . Smoking status: Former Smoker    Types: Cigarettes    Quit date: 08/24/1979  . Smokeless tobacco: Never Used     Comment: smoked  1966-1981, up to 1 ppd  . Alcohol use No  . Drug use: No  . Sexual activity: Not Asked   Other Topics Concern  . None   Social History Narrative  . None     Family History:  The patient's family history includes Alzheimer's disease in her mother; Heart attack in her father; Heart attack (age of onset: 6) in her maternal grandmother; Hypertension in her father, maternal grandmother, and mother; Stroke (age of onset: 50) in her mother.   ROS:   Please see the history of present illness.    Some lower extremity edema late in the day that resolved by the next morning. Her daughter states that she snores when she lies on the left side. There is no orthopnea. Dizziness still persists but is not a major issue.  All other systems reviewed and are negative.   PHYSICAL EXAM:   VS:  BP (!) 152/86 (BP Location: Left Arm)   Pulse 88   Ht 4\' 10"  (1.473 m)   Wt 146 lb 1.9 oz (66.3 kg)   BMI 30.54 kg/m    GEN: Well nourished, well developed, in no acute distress  HEENT: normal  Neck: no JVD, carotid bruits, or masses Cardiac: RRR; no murmurs, rubs, or gallops,no edema  Respiratory:  clear to auscultation bilaterally, normal work of breathing GI: soft, nontender, nondistended, + BS MS: no deformity or atrophy  Skin: warm and dry, no rash Neuro:  Alert and Oriented x 3, Strength and sensation are intact Psych: euthymic mood, full affect  Wt Readings from Last 3 Encounters:  09/17/16 146 lb 1.9 oz (66.3 kg)  09/01/16 145 lb 12 oz (66.1 kg)  08/26/16 147 lb 6 oz (66.8 kg)       Studies/Labs Reviewed:   EKG:  EKG  When last performed in January 2018 demonstrated poor R-wave progression, left axis deviation and possible old inferior infarct. Tall R wave in V1 No significant change when compared to June 2017 with the exception of faster heart rate.  Recent Labs: 01/30/2016: TSH 0.861 02/17/2016: ALT 18 08/26/2016: Hemoglobin 12.5; Platelets 297 09/01/2016: BUN 17; Creatinine, Ser 0.85; Magnesium 2.2; Potassium 3.8; Sodium 143   Lipid Panel    Component Value Date/Time   CHOL 172 09/12/2014 1509   CHOL 262 (H) 02/12/2014  1224   TRIG 98.0 09/12/2014 1509   TRIG 77 02/12/2014 1224   HDL 47.40 09/12/2014 1509   HDL 58 02/12/2014 1224   CHOLHDL 4 09/12/2014 1509   VLDL 19.6 09/12/2014 1509   LDLCALC 105 (H) 09/12/2014 1509   LDLCALC 189 (H) 02/12/2014 1224   LDLDIRECT 165.5 09/24/2009 1052    Additional studies/ records that were reviewed today include:  Brain MRI with contrast June 2017 IMPRESSION: 1.  No acute intracranial abnormality. 2. Chronic and severe but nonspecific signal changes mostly in the cerebral white matter, with mild progression since 2015. There is mild associated chronic micro hemorrhage in the right occipital lobe which has also mildly progressed. Favor chronic small vessel ischemia.   ASSESSMENT:    1. Accelerated hypertension   2. HYPERLIPIDEMIA      PLAN:  In order of problems listed above:  1. Blood pressure is elevated, but for her in a relatively safe range. Attempts at tighter control have led to altered mental status and acute kidney injury with associated profound hypokalemia. A blood pressure target is less than 160/90 mmHg. If we again developed aggressive blood pressure elevations, she should be evaluated for the presence of renal artery stenosis with either duplex Doppler renal scanning or CTA of the kidneys. 2. Not actively being managed. Followed by primary care.   Plan clinical follow-up in one year or earlier  if acceleration and blood pressure values.     Medication Adjustments/Labs and Tests Ordered: Current medicines are reviewed at length with the patient today.  Concerns regarding medicines are outlined above.  Medication changes, Labs and Tests ordered today are listed in the Patient Instructions below. Patient Instructions  Medication Instructions:  None  Labwork: None  Testing/Procedures: None  Follow-Up: Your physician wants you to follow-up in: 1 year with Dr. Tamala Julian. You will receive a reminder letter in the mail two months in advance. If you don't receive a letter, please call our office to schedule the follow-up appointment.   Any Other Special Instructions Will Be Listed Below (If Applicable).   Low-Sodium Eating Plan Sodium raises blood pressure and causes water to be held in the body. Getting less sodium from food will help lower your blood pressure, reduce any swelling, and protect your heart, liver, and kidneys. We get sodium by adding salt (sodium chloride) to food. Most of our sodium comes from canned, boxed, and frozen foods. Restaurant foods, fast foods, and pizza are also very high in sodium. Even if you take medicine to lower your blood pressure or to reduce fluid in your body, getting less sodium from your food is important. What is my plan? Most people should limit their sodium intake to 2,300 mg a day. Your health care provider recommends that you limit your sodium intake to _2grams/2,000mg ___ a day. What do I need to know about this eating plan? For the low-sodium eating plan, you will follow these general guidelines:  Choose foods with a % Daily Value for sodium of less than 5% (as listed on the food label).  Use salt-free seasonings or herbs instead of table salt or sea salt.  Check with your health care provider or pharmacist before using salt substitutes.  Eat fresh foods.  Eat more vegetables and fruits.  Limit canned vegetables. If you do use them,  rinse them well to decrease the sodium.  Limit cheese to 1 oz (28 g) per day.  Eat lower-sodium products, often labeled as "lower sodium" or "no salt added."  Avoid foods that contain monosodium glutamate (MSG). MSG is sometimes added to [place] food and some canned foods.  Check food labels (Nutrition Facts labels) on foods to learn how much sodium is in one serving.  Eat more home-cooked food and less restaurant, buffet, and fast food.  When eating at a restaurant, ask that your food be prepared with less salt, or no salt if possible. How do I read food labels for sodium information? The Nutrition Facts label lists the amount of sodium in one serving of the food. If you eat more than one serving, you must multiply the listed amount of sodium by the number of servings. Food labels may also identify foods as:  Sodium free-Less than 5 mg in a serving.  Very low sodium-35 mg or less in a serving.  Low sodium-140 mg or less in a serving.  Light in sodium-50% less sodium in a serving. For example, if a food that usually has 300 mg of sodium is changed to become light in sodium, it will have 150 mg of sodium.  Reduced sodium-25% less sodium in a serving. For example, if a food that usually has 400 mg of sodium is changed to reduced sodium, it will have 300 mg of sodium. What foods can I eat? Grains  Low-sodium cereals, including oats, puffed wheat and rice, and shredded wheat cereals. Low-sodium crackers. Unsalted rice and pasta. Lower-sodium bread. Vegetables  Frozen or fresh vegetables. Low-sodium or reduced-sodium canned vegetables. Low-sodium or reduced-sodium tomato sauce and paste. Low-sodium or reduced-sodium tomato and vegetable juices. Fruits  Fresh, frozen, and canned fruit. Fruit juice. Meat and Other Protein Products  Low-sodium canned tuna and salmon. Fresh or frozen meat, poultry, seafood, and fish. Lamb. Unsalted nuts. Dried beans, peas, and lentils without added salt.  Unsalted canned beans. Homemade soups without salt. Eggs. Dairy  Milk. Soy milk. Ricotta cheese. Low-sodium or reduced-sodium cheeses. Yogurt. Condiments  Fresh and dried herbs and spices. Salt-free seasonings. Onion and garlic powders. Low-sodium varieties of mustard and ketchup. Fresh or refrigerated horseradish. Lemon juice. Fats and Oils  Reduced-sodium salad dressings. Unsalted butter. Other  Unsalted popcorn and pretzels. The items listed above may not be a complete list of recommended foods or beverages. Contact your dietitian for more options.  What foods are not recommended? Grains  Instant hot cereals. Bread stuffing, pancake, and biscuit mixes. Croutons. Seasoned rice or pasta mixes. Noodle soup cups. Boxed or frozen macaroni and cheese. Self-rising flour. Regular salted crackers. Vegetables  Regular canned vegetables. Regular canned tomato sauce and paste. Regular tomato and vegetable juices. Frozen vegetables in sauces. Salted [place] fries. Olives. Angie Fava. Relishes. Sauerkraut. Salsa. Meat and Other Protein Products  Salted, canned, smoked, spiced, or pickled meats, seafood, or fish. Bacon, ham, sausage, hot dogs, corned beef, chipped beef, and packaged luncheon meats. Salt pork. Jerky. Pickled herring. Anchovies, regular canned tuna, and sardines. Salted nuts. Dairy  Processed cheese and cheese spreads. Cheese curds. Blue cheese and cottage cheese. Buttermilk. Condiments  Onion and garlic salt, seasoned salt, table salt, and sea salt. Canned and packaged gravies. Worcestershire sauce. Tartar sauce. Barbecue sauce. Teriyaki sauce. Soy sauce, including reduced sodium. Steak sauce. Fish sauce. Oyster sauce. Cocktail sauce. Horseradish that you find on the shelf. Regular ketchup and mustard. Meat flavorings and tenderizers. Bouillon cubes. Hot sauce. Tabasco sauce. Marinades. Taco seasonings. Relishes. Fats and Oils  Regular salad dressings. Salted butter. Margarine. Ghee. Bacon  fat. Other  Potato and tortilla chips. Corn chips and puffs. Salted popcorn and  pretzels. Canned or dried soups. Pizza. Frozen entrees and pot pies. The items listed above may not be a complete list of foods and beverages to avoid. Contact your dietitian for more information.  This information is not intended to replace advice given to you by your health care provider. Make sure you discuss any questions you have with your health care provider. Document Released: 01/29/2002 Document Revised: 01/15/2016 Document Reviewed: 06/13/2013 Elsevier Interactive Patient Education  2017 [employer].    If you need a refill on your cardiac medications before your next appointment, please call your pharmacy.      Signed, Sinclair Grooms, MD  09/17/2016 11:48 AM    [address], [place], [place]  [address] Phone: [phone]; Fax: [phone]

## 2016-09-17 ENCOUNTER — Ambulatory Visit (INDEPENDENT_AMBULATORY_CARE_PROVIDER_SITE_OTHER): Payer: Medicare Other | Admitting: Interventional Cardiology

## 2016-09-17 ENCOUNTER — Encounter: Payer: Self-pay | Admitting: Interventional Cardiology

## 2016-09-17 VITALS — BP 152/86 | HR 88 | Ht <= 58 in | Wt 146.1 lb

## 2016-09-17 DIAGNOSIS — I10 Essential (primary) hypertension: Secondary | ICD-10-CM

## 2016-09-17 DIAGNOSIS — E782 Mixed hyperlipidemia: Secondary | ICD-10-CM | POA: Diagnosis not present

## 2016-09-17 NOTE — Patient Instructions (Addendum)
Medication Instructions:  None  Labwork: None  Testing/Procedures: None  Follow-Up: Your physician wants you to follow-up in: 1 year with Dr. Tamala Julian. You will receive a reminder letter in the mail two months in advance. If you don't receive a letter, please call our office to schedule the follow-up appointment.   Any Other Special Instructions Will Be Listed Below (If Applicable).   Low-Sodium Eating Plan Sodium raises blood pressure and causes water to be held in the body. Getting less sodium from food will help lower your blood pressure, reduce any swelling, and protect your heart, liver, and kidneys. We get sodium by adding salt (sodium chloride) to food. Most of our sodium comes from canned, boxed, and frozen foods. Restaurant foods, fast foods, and pizza are also very high in sodium. Even if you take medicine to lower your blood pressure or to reduce fluid in your body, getting less sodium from your food is important. What is my plan? Most people should limit their sodium intake to 2,300 mg a day. Your health care provider recommends that you limit your sodium intake to _2grams/2,000mg ___ a day. What do I need to know about this eating plan? For the low-sodium eating plan, you will follow these general guidelines:  Choose foods with a % Daily Value for sodium of less than 5% (as listed on the food label).  Use salt-free seasonings or herbs instead of table salt or sea salt.  Check with your health care provider or pharmacist before using salt substitutes.  Eat fresh foods.  Eat more vegetables and fruits.  Limit canned vegetables. If you do use them, rinse them well to decrease the sodium.  Limit cheese to 1 oz (28 g) per day.  Eat lower-sodium products, often labeled as "lower sodium" or "no salt added."  Avoid foods that contain monosodium glutamate (MSG). MSG is sometimes added to [place] food and some canned foods.  Check food labels (Nutrition Facts labels) on foods  to learn how much sodium is in one serving.  Eat more home-cooked food and less restaurant, buffet, and fast food.  When eating at a restaurant, ask that your food be prepared with less salt, or no salt if possible. How do I read food labels for sodium information? The Nutrition Facts label lists the amount of sodium in one serving of the food. If you eat more than one serving, you must multiply the listed amount of sodium by the number of servings. Food labels may also identify foods as:  Sodium free-Less than 5 mg in a serving.  Very low sodium-35 mg or less in a serving.  Low sodium-140 mg or less in a serving.  Light in sodium-50% less sodium in a serving. For example, if a food that usually has 300 mg of sodium is changed to become light in sodium, it will have 150 mg of sodium.  Reduced sodium-25% less sodium in a serving. For example, if a food that usually has 400 mg of sodium is changed to reduced sodium, it will have 300 mg of sodium. What foods can I eat? Grains  Low-sodium cereals, including oats, puffed wheat and rice, and shredded wheat cereals. Low-sodium crackers. Unsalted rice and pasta. Lower-sodium bread. Vegetables  Frozen or fresh vegetables. Low-sodium or reduced-sodium canned vegetables. Low-sodium or reduced-sodium tomato sauce and paste. Low-sodium or reduced-sodium tomato and vegetable juices. Fruits  Fresh, frozen, and canned fruit. Fruit juice. Meat and Other Protein Products  Low-sodium canned tuna and salmon. Fresh or frozen meat,  poultry, seafood, and fish. Lamb. Unsalted nuts. Dried beans, peas, and lentils without added salt. Unsalted canned beans. Homemade soups without salt. Eggs. Dairy  Milk. Soy milk. Ricotta cheese. Low-sodium or reduced-sodium cheeses. Yogurt. Condiments  Fresh and dried herbs and spices. Salt-free seasonings. Onion and garlic powders. Low-sodium varieties of mustard and ketchup. Fresh or refrigerated horseradish. Lemon  juice. Fats and Oils  Reduced-sodium salad dressings. Unsalted butter. Other  Unsalted popcorn and pretzels. The items listed above may not be a complete list of recommended foods or beverages. Contact your dietitian for more options.  What foods are not recommended? Grains  Instant hot cereals. Bread stuffing, pancake, and biscuit mixes. Croutons. Seasoned rice or pasta mixes. Noodle soup cups. Boxed or frozen macaroni and cheese. Self-rising flour. Regular salted crackers. Vegetables  Regular canned vegetables. Regular canned tomato sauce and paste. Regular tomato and vegetable juices. Frozen vegetables in sauces. Salted [place] fries. Olives. Angie Fava. Relishes. Sauerkraut. Salsa. Meat and Other Protein Products  Salted, canned, smoked, spiced, or pickled meats, seafood, or fish. Bacon, ham, sausage, hot dogs, corned beef, chipped beef, and packaged luncheon meats. Salt pork. Jerky. Pickled herring. Anchovies, regular canned tuna, and sardines. Salted nuts. Dairy  Processed cheese and cheese spreads. Cheese curds. Blue cheese and cottage cheese. Buttermilk. Condiments  Onion and garlic salt, seasoned salt, table salt, and sea salt. Canned and packaged gravies. Worcestershire sauce. Tartar sauce. Barbecue sauce. Teriyaki sauce. Soy sauce, including reduced sodium. Steak sauce. Fish sauce. Oyster sauce. Cocktail sauce. Horseradish that you find on the shelf. Regular ketchup and mustard. Meat flavorings and tenderizers. Bouillon cubes. Hot sauce. Tabasco sauce. Marinades. Taco seasonings. Relishes. Fats and Oils  Regular salad dressings. Salted butter. Margarine. Ghee. Bacon fat. Other  Potato and tortilla chips. Corn chips and puffs. Salted popcorn and pretzels. Canned or dried soups. Pizza. Frozen entrees and pot pies. The items listed above may not be a complete list of foods and beverages to avoid. Contact your dietitian for more information.  This information is not intended to replace  advice given to you by your health care provider. Make sure you discuss any questions you have with your health care provider. Document Released: 01/29/2002 Document Revised: 01/15/2016 Document Reviewed: 06/13/2013 Elsevier Interactive Patient Education  2017 [employer].    If you need a refill on your cardiac medications before your next appointment, please call your pharmacy.

## 2016-10-11 DIAGNOSIS — H353221 Exudative age-related macular degeneration, left eye, with active choroidal neovascularization: Secondary | ICD-10-CM | POA: Diagnosis not present

## 2016-11-15 DIAGNOSIS — H353221 Exudative age-related macular degeneration, left eye, with active choroidal neovascularization: Secondary | ICD-10-CM | POA: Diagnosis not present

## 2016-12-06 DIAGNOSIS — J0100 Acute maxillary sinusitis, unspecified: Secondary | ICD-10-CM | POA: Diagnosis not present

## 2016-12-06 DIAGNOSIS — J3089 Other allergic rhinitis: Secondary | ICD-10-CM | POA: Diagnosis not present

## 2016-12-06 DIAGNOSIS — J4520 Mild intermittent asthma, uncomplicated: Secondary | ICD-10-CM | POA: Diagnosis not present

## 2016-12-06 DIAGNOSIS — J301 Allergic rhinitis due to pollen: Secondary | ICD-10-CM | POA: Diagnosis not present

## 2016-12-09 ENCOUNTER — Encounter: Payer: Self-pay | Admitting: Internal Medicine

## 2016-12-09 ENCOUNTER — Ambulatory Visit (INDEPENDENT_AMBULATORY_CARE_PROVIDER_SITE_OTHER): Payer: Medicare Other | Admitting: Internal Medicine

## 2016-12-09 VITALS — BP 160/100 | HR 82 | Temp 98.2°F | Resp 12 | Ht <= 58 in | Wt 145.0 lb

## 2016-12-09 DIAGNOSIS — I872 Venous insufficiency (chronic) (peripheral): Secondary | ICD-10-CM | POA: Diagnosis not present

## 2016-12-09 NOTE — Progress Notes (Signed)
Pre visit review using our clinic review tool, if applicable. No additional management support is needed unless otherwise documented below in the visit note. 

## 2016-12-09 NOTE — Patient Instructions (Signed)
Use the compression stockings every day to help prevent swelling.   Also work on propping the legs up in bed and while sitting around to help keep them from swelling as well.

## 2016-12-10 DIAGNOSIS — I872 Venous insufficiency (chronic) (peripheral): Secondary | ICD-10-CM | POA: Insufficient documentation

## 2016-12-10 NOTE — Assessment & Plan Note (Signed)
I would not recommend to add back lasix given her low potassium readings on AKI with dehydration on several occasions. She is given rx for compression stockings. We talked about propping up her legs and decreasing salt intake while increasing water intake to help. She will work on this.

## 2016-12-10 NOTE — Progress Notes (Signed)
   Subjective:    Patient ID: Stacy Page, female    DOB: Feb 01, 1933, 83 y.o.   MRN: [id number]  HPI The patient is an 83 YO female coming in for some swelling in her legs. She has been taking off lasix due to persistent hypoglycemia. She is still taking losartan/hctz. She denies change in diet or increase in salt intake. She denies SOB on exertion or while lying flat. She denies trying anything for the swelling. She does not prop her feet up. They are not large but she can notice the difference. They do swell more throughout the day and are smaller in the morning.   Review of Systems  Constitutional: Negative.   Respiratory: Negative.   Cardiovascular: Positive for leg swelling. Negative for chest pain and palpitations.  Gastrointestinal: Negative.   Musculoskeletal: Negative.   Skin: Negative.   Neurological: Negative.       Objective:   Physical Exam  Constitutional: She is oriented to person, place, and time. She appears well-developed and well-nourished.  HENT:  Head: Normocephalic and atraumatic.  Eyes: EOM are normal.  Cardiovascular: Normal rate and regular rhythm.   Pulmonary/Chest: Effort normal and breath sounds normal. No respiratory distress. She has no wheezes. She has no rales.  Abdominal: Soft.  Musculoskeletal: She exhibits edema.  1+ non-pitting edema both ankles  Neurological: She is alert and oriented to person, place, and time.  Skin: Skin is warm and dry.   Vitals:   12/09/16 1609  BP: (!) 160/100  Pulse: 82  Resp: 12  Temp: 98.2 F (36.8 C)  TempSrc: Oral  SpO2: 100%  Weight: 145 lb (65.8 kg)  Height: 4\' 10"  (1.473 m)      Assessment & Plan:

## 2016-12-20 DIAGNOSIS — H353221 Exudative age-related macular degeneration, left eye, with active choroidal neovascularization: Secondary | ICD-10-CM | POA: Diagnosis not present

## 2016-12-21 ENCOUNTER — Ambulatory Visit (INDEPENDENT_AMBULATORY_CARE_PROVIDER_SITE_OTHER): Payer: Medicare Other | Admitting: Podiatry

## 2016-12-21 ENCOUNTER — Ambulatory Visit (HOSPITAL_BASED_OUTPATIENT_CLINIC_OR_DEPARTMENT_OTHER)
Admission: RE | Admit: 2016-12-21 | Discharge: 2016-12-21 | Disposition: A | Discharge status: Home / Self Care | Payer: Medicare Other | Source: Ambulatory Visit | Attending: Podiatry | Admitting: Podiatry

## 2016-12-21 ENCOUNTER — Encounter: Payer: Self-pay | Admitting: Podiatry

## 2016-12-21 DIAGNOSIS — B351 Tinea unguium: Secondary | ICD-10-CM | POA: Diagnosis not present

## 2016-12-21 DIAGNOSIS — M2041 Other hammer toe(s) (acquired), right foot: Secondary | ICD-10-CM | POA: Insufficient documentation

## 2016-12-21 DIAGNOSIS — M2061 Acquired deformities of toe(s), unspecified, right foot: Secondary | ICD-10-CM | POA: Diagnosis not present

## 2016-12-21 NOTE — Patient Instructions (Signed)

## 2016-12-21 NOTE — Progress Notes (Signed)
   Subjective:    Patient ID: Stacy Page, female    DOB: Aug 17, 1933, 83 y.o.   MRN: [id number]  HPI  83 year old female presents the also concerns of painful hammertoe the right third toe is been ongoing for several months. She has tried padding, shoe gear changes, offloading without any significant improvement and because the patient doesn't want to her heel. She states that she's had hammertoe surgery the other digits but not on this toe. She would proceed with surgery to get this toe to sit in a somewhat more straighter position in order to help with the pain. She denies any numbness or tingling. She has difficulty walking on a daily basis because of this and describes as a throbbing sensation. She also states that she has some thickened toenails as well as discoloration and should have some treatment of toenail fungus.   Review of Systems  All other systems reviewed and are negative.      Objective:   Physical Exam General: AAO x3, NAD  Dermatological: Skin is warm, dry and supple bilateral. Nails x 10 are well manicured; remaining integument appears unremarkable at this time. There are no open sores, no preulcerative lesions, no rash or signs of infection present.  Vascular: Dorsalis Pedis artery and Posterior Tibial artery pedal pulses are palpable bilateral with immedate capillary fill time. . There is no pain with calf compression, swelling, warmth, erythema.   Neruologic: Grossly intact via light touch bilateral. Vibratory intact via tuning fork bilateral. Protective threshold with Semmes Wienstein monofilament intact to all pedal sites bilateral.   Musculoskeletal: Semirigid hammertoe contracture present on the right third toe. There is tenderness the dorsal aspect of the PIPJ on hammertoe deformity. There is no other area pinpoint bony tenderness. MMT 5/5. Range of motion intact.   Assessment: Right third digit symptomatic hammertoe, onychomycosis   Plan: -Treatment options  discussed including all alternatives, risks, and complications -Etiology of symptoms were discussed -X-rays were obtained and reviewed with the patient.  -I discussed both conservative and surgical treatment options. She is tried multiple conservative treatment including but not limited to shoe gear modifications, padding, offloading any significant improvement in his time she is requesting surgical revision. Discussed arthroplasty of the right third digit at the PIPJ to wishes to proceed. Discussed the surgery as well as postoperative course.  -The incision placement as well as the postoperative course was discussed with the patient. I discussed risks of the surgery which include, but not limited to, infection, bleeding, pain, swelling, need for further surgery, delayed or nonhealing, painful or ugly scar, numbness or sensation changes, over/under correction, recurrence, transfer lesions, further deformity, hardware failure, deformity can come back, DVT/PE, loss of toe/foot. Patient understands these risks and wishes to proceed with surgery. The surgical consent was reviewed with the patient all 3 pages were signed. No promises or guarantees were given to the outcome of the procedure. All questions were answered to the best of my ability. Before the surgery the patient was encouraged to call the office if there is any further questions. The surgery will be performed at the [facility] on an outpatient basis. -Will order arterial studies to ensure adequate healing prior to surgery -Ordered compound cream for onychomycosis through [facility].  -Surgical shoe dispensed for after surgery.   Celesta Gentile, DPM

## 2016-12-22 ENCOUNTER — Telehealth: Payer: Self-pay | Admitting: *Deleted

## 2016-12-23 NOTE — Telephone Encounter (Addendum)
"  I'm calling for [employer].  She's a patient of Dr. Jacqualyn Posey.  She was told to call to schedule surgery sometime after May 23.  You can call Maude or you can call me to set up surgery.  My name is [name] and I am her daughter."

## 2016-12-24 ENCOUNTER — Telehealth: Payer: Self-pay | Admitting: *Deleted

## 2016-12-24 DIAGNOSIS — R0989 Other specified symptoms and signs involving the circulatory and respiratory systems: Secondary | ICD-10-CM

## 2016-12-24 NOTE — Telephone Encounter (Addendum)
-----   Message from Trula Slade, DPM sent at 12/23/2016  6:34 PM EDT ----- Can you please order arterial studies for decreased pulses? I would like to get this done BEFORE surgery. Thanks. 12/24/2016-Orders faxed to [facility].

## 2016-12-29 NOTE — Telephone Encounter (Signed)
Your daughter had sent me a message to call you to schedule your surgery with Dr. Jacqualyn Posey.  Do you have a date in mind?  "Yes, I would like to do it on May 30."  I will get it scheduled, that date is available.  Someone from the surgical center will call you a day or two prior to surgery date with the arrival time.  You can register on-line with the surgical center.  If you do not have access to a computer, someone from the surgical center will call you and you can give them the information they need.  "Will it be okay to take my blood pressure medication?"  Ask them when they call from the surgical center.

## 2016-12-29 NOTE — Telephone Encounter (Signed)
The surgery center will let her know about the blood pressure medication. Can you please make sure she has her arterial studies done before surgery?

## 2017-01-12 ENCOUNTER — Ambulatory Visit (HOSPITAL_COMMUNITY)
Admission: RE | Admit: 2017-01-12 | Discharge: 2017-01-12 | Disposition: A | Discharge status: Home / Self Care | Payer: Medicare Other | Source: Ambulatory Visit | Attending: Cardiology | Admitting: Cardiology

## 2017-01-12 DIAGNOSIS — R0989 Other specified symptoms and signs involving the circulatory and respiratory systems: Secondary | ICD-10-CM

## 2017-01-13 DIAGNOSIS — H16223 Keratoconjunctivitis sicca, not specified as Sjogren's, bilateral: Secondary | ICD-10-CM | POA: Diagnosis not present

## 2017-01-13 DIAGNOSIS — H25813 Combined forms of age-related cataract, bilateral: Secondary | ICD-10-CM | POA: Diagnosis not present

## 2017-01-13 DIAGNOSIS — H353222 Exudative age-related macular degeneration, left eye, with inactive choroidal neovascularization: Secondary | ICD-10-CM | POA: Diagnosis not present

## 2017-01-18 NOTE — Telephone Encounter (Addendum)
-----   Message from Trula Slade, DPM sent at 01/18/2017  8:30 AM EDT ----- Circulation appears to be good to heal the surgery on the right foot. Please let her know. Thanks. I informed pt and she states she has surgery scheduled for Wednesday morning.

## 2017-01-19 ENCOUNTER — Encounter: Payer: Self-pay | Admitting: Podiatry

## 2017-01-19 DIAGNOSIS — M2041 Other hammer toe(s) (acquired), right foot: Secondary | ICD-10-CM | POA: Diagnosis not present

## 2017-01-19 DIAGNOSIS — E7800 Pure hypercholesterolemia, unspecified: Secondary | ICD-10-CM | POA: Diagnosis not present

## 2017-01-20 NOTE — Progress Notes (Signed)
DOS 05.30.2018 Right 3rd (third) digit hammertoe repair.

## 2017-01-22 ENCOUNTER — Other Ambulatory Visit: Payer: Self-pay | Admitting: Cardiology

## 2017-01-24 NOTE — Telephone Encounter (Signed)
REFILL 

## 2017-01-25 ENCOUNTER — Encounter: Payer: Medicare Other | Admitting: Podiatry

## 2017-01-27 ENCOUNTER — Ambulatory Visit (INDEPENDENT_AMBULATORY_CARE_PROVIDER_SITE_OTHER): Payer: Medicare Other

## 2017-01-27 ENCOUNTER — Encounter: Payer: Self-pay | Admitting: Podiatry

## 2017-01-27 ENCOUNTER — Ambulatory Visit (INDEPENDENT_AMBULATORY_CARE_PROVIDER_SITE_OTHER): Payer: Self-pay | Admitting: Podiatry

## 2017-01-27 DIAGNOSIS — M2041 Other hammer toe(s) (acquired), right foot: Secondary | ICD-10-CM

## 2017-01-27 DIAGNOSIS — Z9889 Other specified postprocedural states: Secondary | ICD-10-CM

## 2017-01-30 NOTE — Progress Notes (Signed)
Subjective: Stacy Page is a 83 y.o. is seen today in office s/p right 3rd digit hammertoe repair preformed on 01/19/2017 for POV #1. They state their pain is controlled and she only took pain medication the first day. She has remained in the surgical shoe. Denies any systemic complaints such as fevers, chills, nausea, vomiting. No calf pain, chest pain, shortness of breath.   Objective: General: No acute distress, AAOx3  DP/PT pulses palpable 2/4, CRT < 3 sec to all digits.  Protective sensation intact. Motor function intact.  Right foot: Incision is well coapted without any evidence of dehiscence with sutures intact.. There is no surrounding erythema, ascending cellulitis, fluctuance, crepitus, malodor, drainage/purulence. There is mild edema around the surgical site. There is no pain along the surgical site. Toe is in a rectus position.  No other areas of tenderness to bilateral lower extremities.  No other open lesions or pre-ulcerative lesions.  No pain with calf compression, swelling, warmth, erythema.   Assessment and Plan:  Status post right 3rd digit hammertoe surgery, doing well with no complications   -Treatment options discussed including all alternatives, risks, and complications -X-rays were obtained and reviewed with the patient. Evidence of arthroplasty of the right third toe. No evidence of acute fracture. -Antibiotic ointment was applied followed by a dressing. Keep clean, dry, intact. -Continue surgical shoe. -Ice/elevation -Pain medication as needed. -Monitor for any clinical signs or symptoms of infection and DVT/PE and directed to call the office immediately should any occur or go to the ER. -Follow-up as scheduled for POSSIBLE suture removal or sooner if any problems arise. In the meantime, encouraged to call the office with any questions, concerns, change in symptoms.   Celesta Gentile, DPM

## 2017-01-31 ENCOUNTER — Other Ambulatory Visit: Payer: Self-pay | Admitting: *Deleted

## 2017-01-31 MED ORDER — MONTELUKAST SODIUM 10 MG PO TABS: 10.0000 mg | ORAL_TABLET | Freq: Every day | ORAL | 0 refills | Status: DC

## 2017-01-31 MED ORDER — HYDRALAZINE HCL 100 MG PO TABS: 100.0000 mg | ORAL_TABLET | Freq: Three times a day (TID) | ORAL | 0 refills | Status: DC

## 2017-01-31 NOTE — Telephone Encounter (Signed)
Rec'd call pt states she is needing a 30 day script sent to [facility] on her Hydralazine & Singulair until she receive order from mail service. Verified chart inform pt will send to [facility]...Stacy Page

## 2017-02-01 ENCOUNTER — Encounter: Payer: Medicare Other | Admitting: Podiatry

## 2017-02-03 DIAGNOSIS — H3322 Serous retinal detachment, left eye: Secondary | ICD-10-CM | POA: Diagnosis not present

## 2017-02-03 DIAGNOSIS — H353121 Nonexudative age-related macular degeneration, left eye, early dry stage: Secondary | ICD-10-CM | POA: Diagnosis not present

## 2017-02-03 DIAGNOSIS — H3570 Unspecified separation of retinal layers: Secondary | ICD-10-CM | POA: Diagnosis not present

## 2017-02-03 DIAGNOSIS — H353221 Exudative age-related macular degeneration, left eye, with active choroidal neovascularization: Secondary | ICD-10-CM | POA: Diagnosis not present

## 2017-02-03 DIAGNOSIS — H35352 Cystoid macular degeneration, left eye: Secondary | ICD-10-CM | POA: Diagnosis not present

## 2017-02-08 ENCOUNTER — Ambulatory Visit (INDEPENDENT_AMBULATORY_CARE_PROVIDER_SITE_OTHER): Payer: Self-pay | Admitting: Podiatry

## 2017-02-08 DIAGNOSIS — M2041 Other hammer toe(s) (acquired), right foot: Secondary | ICD-10-CM

## 2017-02-08 DIAGNOSIS — Z9889 Other specified postprocedural states: Secondary | ICD-10-CM

## 2017-02-08 NOTE — Progress Notes (Signed)
Subjective: Stacy Page is a 83 y.o. is seen today in office s/p right 3rd digit hammertoe repair preformed on 01/19/2017 for POV #2. They state their pain is controlled and has not been taking any pain medication. She has remained in the surgical shoe. She presents today for surgical shoe. Denies any systemic complaints such as fevers, chills, nausea, vomiting. No calf pain, chest pain, shortness of breath.   Objective: General: No acute distress, AAOx3  DP/PT pulses palpable 2/4, CRT < 3 sec to all digits.  Protective sensation intact. Motor function intact.  Right foot: Incision is well coapted without any evidence of dehiscence with sutures intact.. There is no surrounding erythema, ascending cellulitis, fluctuance, crepitus, malodor, drainage/purulence. There is mild edema around the surgical site. There is no pain along the surgical site. Toe is in a rectus position.  No other areas of tenderness to bilateral lower extremities.  No other open lesions or pre-ulcerative lesions.  No pain with calf compression, swelling, warmth, erythema.   Assessment and Plan:  Status post right 3rd digit hammertoe surgery, doing well with no complications   -Treatment options discussed including all alternatives, risks, and complications -Half of the suture were removed today. I left half the sutures intact which we reviewed his next appointment. Antibiotic ointment was applied followed by a bandage. Keep the dressing clean, dry, intact.  -Continue surgical shoe. -Ice/elevation -Pain medication as needed- she has not been needing this.  -Monitor for any clinical signs or symptoms of infection and DVT/PE and directed to call the office immediately should any occur or go to the ER. -Follow-up as scheduled for suture removal or sooner if any problems arise. In the meantime, encouraged to call the office with any questions, concerns, change in symptoms.   Celesta Gentile, DPM

## 2017-02-15 ENCOUNTER — Ambulatory Visit (INDEPENDENT_AMBULATORY_CARE_PROVIDER_SITE_OTHER): Payer: Medicare Other | Admitting: Podiatry

## 2017-02-15 ENCOUNTER — Encounter: Payer: Self-pay | Admitting: Podiatry

## 2017-02-15 DIAGNOSIS — M2041 Other hammer toe(s) (acquired), right foot: Secondary | ICD-10-CM

## 2017-02-15 DIAGNOSIS — Z9889 Other specified postprocedural states: Secondary | ICD-10-CM

## 2017-02-15 NOTE — Progress Notes (Signed)
Subjective: Stacy Page is a 83 y.o. is seen today in office s/p right 3rd digit hammertoe repair preformed on 01/19/2017 for POV #3. They state their pain is controlled and has not been taking any pain medication. She presents today to remove the remainder of the sutures. She has remained in the surgical shoe. Denies any systemic complaints such as fevers, chills, nausea, vomiting. No calf pain, chest pain, shortness of breath.   Objective: General: No acute distress, AAOx3  DP/PT pulses palpable 2/4, CRT < 3 sec to all digits.  Protective sensation intact. Motor function intact.  Right foot: Incision is well coapted without any evidence of dehiscence with sutures intact.. There is no surrounding erythema, ascending cellulitis, fluctuance, crepitus, malodor, drainage/purulence. There is minimal edema around the surgical site. There is no pain along the surgical site. Toe is in a rectus position.  No other areas of tenderness to bilateral lower extremities.  No other open lesions or pre-ulcerative lesions.  No pain with calf compression, swelling, warmth, erythema.   Assessment and Plan:  Status post right 3rd digit hammertoe surgery, doing well with no complications   -Treatment options discussed including all alternatives, risks, and complications -Remainder of the suture were removed today. After removal incision remained well coapted and is healing well. Images applied. -She started transition to regular shoe as tolerated. Mrs. a gradual transition. -Ice/elevation -Monitor for any clinical signs or symptoms of infection and DVT/PE and directed to call the office immediately should any occur or go to the ER. -Follow-up in 4 weeks or sooner if any problems arise. In the meantime, encouraged to call the office with any questions, concerns, change in symptoms.   Celesta Gentile, DPM

## 2017-02-26 ENCOUNTER — Other Ambulatory Visit: Payer: Self-pay | Admitting: Internal Medicine

## 2017-03-02 ENCOUNTER — Other Ambulatory Visit: Payer: Self-pay | Admitting: Internal Medicine

## 2017-03-08 ENCOUNTER — Ambulatory Visit (INDEPENDENT_AMBULATORY_CARE_PROVIDER_SITE_OTHER): Payer: Self-pay | Admitting: Podiatry

## 2017-03-08 ENCOUNTER — Encounter: Payer: Self-pay | Admitting: Podiatry

## 2017-03-08 DIAGNOSIS — M2041 Other hammer toe(s) (acquired), right foot: Secondary | ICD-10-CM

## 2017-03-08 DIAGNOSIS — Z9889 Other specified postprocedural states: Secondary | ICD-10-CM

## 2017-03-08 NOTE — Progress Notes (Signed)
Subjective: Stacy Page is a 83 y.o. is seen today in office s/p right 3rd digit hammertoe repair preformed on 01/19/2017 for POV #4. She states that she is doing well. His last upon which is been in a regular shoe not having any issues. She still gets some very minimal swelling at times but overall she states that she is doing well. She even able to wear a sandal without any issues. She states that she is happy with the outcome of the surgery at this time.  Denies any systemic complaints such as fevers, chills, nausea, vomiting. No calf pain, chest pain, shortness of breath.   Objective: General: No acute distress, AAOx3  DP/PT pulses palpable 2/4, CRT < 3 sec to all digits.  Protective sensation intact. Motor function intact.  Right foot: Incision is well coapted without any evidence of dehiscence and a scar has formed There is no surrounding erythema, ascending cellulitis, fluctuance, crepitus, malodor, drainage/purulence. There is trace edema around the surgical site. There is no pain along the surgical site. Toe is in a rectus position.  No other areas of tenderness to bilateral lower extremities.  No other open lesions or pre-ulcerative lesions.  No pain with calf compression, swelling, warmth, erythema.   Assessment and Plan:  Status post right 3rd digit hammertoe surgery, doing well with no complications   -Treatment options discussed including all alternatives, risks, and complications -At this time she is able to wear regular shoe without any problems. There is a minimal edema. She has no pain to the toe. There is no hyperkeratotic lesions. This time gradually increase activity and continue with supportive shoe gear. I will discharge from the postoperative care she is doing well however she has any issues she is encouraged to call the office and she agrees to this plan.  Celesta Gentile, DPM

## 2017-03-12 ENCOUNTER — Other Ambulatory Visit: Payer: Self-pay | Admitting: Internal Medicine

## 2017-03-17 DIAGNOSIS — H353121 Nonexudative age-related macular degeneration, left eye, early dry stage: Secondary | ICD-10-CM | POA: Diagnosis not present

## 2017-03-17 DIAGNOSIS — H353221 Exudative age-related macular degeneration, left eye, with active choroidal neovascularization: Secondary | ICD-10-CM | POA: Diagnosis not present

## 2017-03-17 DIAGNOSIS — H3570 Unspecified separation of retinal layers: Secondary | ICD-10-CM | POA: Diagnosis not present

## 2017-03-17 DIAGNOSIS — H35352 Cystoid macular degeneration, left eye: Secondary | ICD-10-CM | POA: Diagnosis not present

## 2017-04-16 ENCOUNTER — Other Ambulatory Visit: Payer: Self-pay | Admitting: Internal Medicine

## 2017-04-18 ENCOUNTER — Other Ambulatory Visit: Payer: Self-pay | Admitting: Internal Medicine

## 2017-04-28 DIAGNOSIS — H353121 Nonexudative age-related macular degeneration, left eye, early dry stage: Secondary | ICD-10-CM | POA: Diagnosis not present

## 2017-04-28 DIAGNOSIS — H3570 Unspecified separation of retinal layers: Secondary | ICD-10-CM | POA: Diagnosis not present

## 2017-04-28 DIAGNOSIS — H35352 Cystoid macular degeneration, left eye: Secondary | ICD-10-CM | POA: Diagnosis not present

## 2017-04-28 DIAGNOSIS — H3322 Serous retinal detachment, left eye: Secondary | ICD-10-CM | POA: Diagnosis not present

## 2017-04-28 DIAGNOSIS — H353221 Exudative age-related macular degeneration, left eye, with active choroidal neovascularization: Secondary | ICD-10-CM | POA: Diagnosis not present

## 2017-05-16 NOTE — Progress Notes (Signed)
Subjective:   Stacy Page is a 83 y.o. female who presents for Medicare Annual (Subsequent) preventive examination.  Review of Systems:  No ROS.  Medicare Wellness Visit. Additional risk factors are reflected in the social history.  Cardiac Risk Factors include: dyslipidemia;hypertension;advanced age (>30men, >58 women)  Sleep patterns: feels rested on waking, gets up 2  times nightly to void and sleeps 6-7 hours nightly.    Home Safety/Smoke Alarms: Feels safe in home. Smoke alarms in place.  Living environment; residence and Firearm Safety: 1-story house/ trailer, no firearms. Lives with daughter, no needs for DME, good support system Seat Belt Safety/Bike Helmet: Wears seat belt.     Objective:     Vitals: BP (!) 144/82   Pulse 73   Resp 20   Ht 4\' 10"  (1.473 m)   Wt 142 lb (64.4 kg)   SpO2 99%   BMI 29.68 kg/m   Body mass index is 29.68 kg/m.   Tobacco History  Smoking Status  . Former Smoker  . Types: Cigarettes  . Quit date: 08/24/1979  Smokeless Tobacco  . Never Used    Comment: smoked  1966-1981, up to 1 ppd     Counseling given: Not Answered   Past Medical History:  Diagnosis Date  . Allergy    seasonal  . Asthma   . GERD (gastroesophageal reflux disease)   . Hyperlipidemia   . Hypertension   . Transient cerebral ischemia    Past Surgical History:  Procedure Laterality Date  . ABDOMINAL HYSTERECTOMY     for fibroids (no BSO)  . BUNIONECTOMY Right   . CARPAL TUNNEL RELEASE     Bilaterally  . COLONOSCOPY  2011   tiny polyp ; [facility]  . Macular Degeneration     Dr Zadie Rhine  ; injections  . UMBILICAL HERNIA REPAIR     Family History  Problem Relation Age of Onset  . Alzheimer's disease Mother   . Hypertension Mother   . Stroke Mother 68  . Hypertension Father   . Heart attack Father        in 28's  . Hypertension Maternal Grandmother   . Heart attack Maternal Grandmother 73  . Diabetes Unknown        1/2 sister  . Colon cancer  Neg Hx   . Esophageal cancer Neg Hx   . Pancreatic cancer Neg Hx   . Kidney disease Neg Hx   . Liver disease Neg Hx    History  Sexual Activity  . Sexual activity: Not on file    Outpatient Encounter Prescriptions as of 05/17/2017  Medication Sig  . acetaminophen (TYLENOL) 500 MG tablet Take 500 mg by mouth every 6 (six) hours as needed.  Marland Kitchen albuterol (PROVENTIL HFA;VENTOLIN HFA) 108 (90 Base) MCG/ACT inhaler Inhale 1-2 puffs into the lungs every 4 (four) hours as needed for wheezing or shortness of breath.  Marland Kitchen atorvastatin (LIPITOR) 40 MG tablet TAKE 1/2 TABLET BY MOUTH  EVERY MONDAY, WEDNESDAY,  FRIDAY, AND SUNDAY  . azelastine (ASTELIN) 0.1 % nasal spray Place 2 sprays into both nostrils at bedtime as needed. (allergies)  . clopidogrel (PLAVIX) 75 MG tablet Take 1 tablet by mouth  daily  . co-enzyme Q-10 30 MG capsule Take 30 mg by mouth daily.   . cycloSPORINE (RESTASIS) 0.05 % ophthalmic emulsion Place 1 drop into both eyes 2 (two) times daily.  . feeding supplement, ENSURE ENLIVE, (ENSURE ENLIVE) LIQD Take 237 mLs by mouth 2 (two) times daily  between meals.  . fluticasone (FLONASE) 50 MCG/ACT nasal spray Place 2 sprays into both nostrils daily as needed for allergies or rhinitis.  Marland Kitchen gabapentin (NEURONTIN) 100 MG capsule Take 100 mg by mouth 3 (three) times daily as needed (leg and lower back pain).  . hydrALAZINE (APRESOLINE) 100 MG tablet Take 1 tablet (100 mg total) by mouth 3 (three) times daily.  Marland Kitchen levocetirizine (XYZAL) 5 MG tablet Take 1 tablet (5 mg total) by mouth every evening.  Marland Kitchen LORazepam (ATIVAN) 0.5 MG tablet Take 1 tablet (0.5 mg total) by mouth daily as needed for anxiety.  Marland Kitchen losartan-hydrochlorothiazide (HYZAAR) 100-25 MG tablet Take 1 tablet by mouth daily.  . meclizine (ANTIVERT) 25 MG tablet TAKE 1/2 TO 1 TABLET BY  MOUTH 3 TIMES DAILY AS  NEEDED FOR DIZZINESS OR  NAUSEA  . montelukast (SINGULAIR) 10 MG tablet Take 1 tablet (10 mg total) by mouth at bedtime. Reported  on 02/17/2016  . pantoprazole (PROTONIX) 20 MG tablet Take 1 tablet by mouth  daily  . [DISCONTINUED] cephALEXin (KEFLEX) 500 MG capsule Take 500 mg by mouth 3 (three) times daily.  . [DISCONTINUED] HYDROcodone-acetaminophen (NORCO/VICODIN) 5-325 MG tablet Take 1 tablet by mouth every 4 (four) hours as needed for moderate pain.  . [DISCONTINUED] promethazine (PHENERGAN) 25 MG tablet Take 25 mg by mouth every 8 (eight) hours as needed for nausea or vomiting.   No facility-administered encounter medications on file as of 05/17/2017.     Activities of Daily Living In your present state of health, do you have any difficulty performing the following activities: 05/17/2017  Hearing? N  Vision? N  Difficulty concentrating or making decisions? N  Walking or climbing stairs? N  Dressing or bathing? N  Doing errands, shopping? N  Preparing Food and eating ? N  Using the Toilet? N  In the past six months, have you accidently leaked urine? N  Do you have problems with loss of bowel control? N  Managing your Medications? N  Managing your Finances? N  Housekeeping or managing your Housekeeping? N  Some recent data might be hidden    Patient Care Team: Hoyt Koch, MD as PCP - General (Internal Medicine) Belva Crome, MD as Consulting Physician (Cardiology)    Assessment:    Physical assessment deferred to PCP.  Exercise Activities and Dietary recommendations Current Exercise Habits: The patient does not participate in regular exercise at present (chair exercise pamphlet provided), Exercise limited by: orthopedic condition(s)  Diet (meal preparation, eat out, water intake, caffeinated beverages, dairy products, fruits and vegetables): in general, a "healthy" diet  , well balanced, eats a variety of fruits and vegetables daily, limits salt, fat/cholesterol, sugar, caffeine, drinks 2-3 glasses of water daily. Supplements with ensure  Encouraged patient to increase daily water  intake.  Goals    . Increase physical  activity          Will start to do chair exercises and ride stationary bike 2-3 times a week. Enjoy life and family.     . patient          Socialize more  [employer]; [phone] [address] [place] [employer]; [phone]  Possible community housing solutions to put up handrails   Ask about the [facility] or the [facility];   [employer]; [phone] Management consultant; Information regarding Long Term Care  For safety consider chair in tub; Or shower transfer bench  Fall Risk Fall Risk  05/17/2017 04/07/2016 01/30/2016  Falls in the past year? No No No   Depression Screen PHQ 2/9 Scores 05/17/2017 04/07/2016 01/30/2016  PHQ - 2 Score 0 0 0  PHQ- 9 Score 0 - -     Cognitive Function MMSE - Mini Mental State Exam 05/17/2017 04/07/2016  Orientation to time 5 5  Orientation to Place 5 5  Registration 3 3  Attention/ Calculation 5 1  Recall 2 2  Language- name 2 objects 2 2  Language- repeat 1 1  Language- follow 3 step command 3 3  Language- read & follow direction 1 1  Write a sentence 1 1  Copy design 1 1  Total score 29 25        Immunization History  Administered Date(s) Administered  . Influenza Split 04/23/2012  . Influenza Whole 06/14/2007, 05/23/2008, 05/26/2009, 05/13/2010  . Influenza, High Dose Seasonal PF 05/12/2016  . Influenza-Unspecified 04/24/2014, 05/24/2015  . Pneumococcal Conjugate-13 04/24/2014  . Pneumococcal Polysaccharide-23 02/17/2016  . Tdap 11/16/2010  . Zoster 12/17/2012   Screening Tests Health Maintenance  Topic Date Due  . INFLUENZA VACCINE  03/23/2017  . TETANUS/TDAP  11/15/2020  . DEXA SCAN  Completed  . PNA vac Low Risk Adult  Completed      Plan:    Continue doing brain stimulating activities (puzzles, reading, adult coloring books, staying active) to keep memory sharp.   Continue to eat heart healthy diet  (full of fruits, vegetables, whole grains, lean protein, water--limit salt, fat, and sugar intake) and increase physical activity as tolerated.  I have personally reviewed and noted the following in the patient's chart:   . Medical and social history . Use of alcohol, tobacco or illicit drugs  . Current medications and supplements . Functional ability and status . Nutritional status . Physical activity . Advanced directives . List of other physicians . Vitals . Screenings to include cognitive, depression, and falls . Referrals and appointments  In addition, I have reviewed and discussed with patient certain preventive protocols, quality metrics, and best practice recommendations. A written personalized care plan for preventive services as well as general preventive health recommendations were provided to patient.     Michiel Cowboy, RN  05/17/2017

## 2017-05-16 NOTE — Progress Notes (Signed)
Pre visit review using our clinic review tool, if applicable. No additional management support is needed unless otherwise documented below in the visit note. 

## 2017-05-17 ENCOUNTER — Ambulatory Visit (INDEPENDENT_AMBULATORY_CARE_PROVIDER_SITE_OTHER): Payer: Medicare Other | Admitting: *Deleted

## 2017-05-17 VITALS — BP 144/82 | HR 73 | Resp 20 | Ht <= 58 in | Wt 142.0 lb

## 2017-05-17 DIAGNOSIS — Z23 Encounter for immunization: Secondary | ICD-10-CM

## 2017-05-17 DIAGNOSIS — Z0000 Encounter for general adult medical examination without abnormal findings: Secondary | ICD-10-CM

## 2017-05-17 NOTE — Patient Instructions (Addendum)
Continue doing brain stimulating activities (puzzles, reading, adult coloring books, staying active) to keep memory sharp.   Continue to eat heart healthy diet (full of fruits, vegetables, whole grains, lean protein, water--limit salt, fat, and sugar intake) and increase physical activity as tolerated.  [employer]: http://www.lifelinesys.com/content/home; (223) 223-0090 O5366   [name] , Thank you for taking time to come for your Medicare Wellness Visit. I appreciate your ongoing commitment to your health goals. Please review the following plan we discussed and let me know if I can assist you in the future.   These are the goals we discussed: Goals    . Increase physical  activity          Will start to do chair exercises and ride stationary bike 2-3 times a week. Enjoy life and family.     . patient          Socialize more  [employer]; [phone] [address] [place] [employer]; [phone]  Possible community housing solutions to put up handrails   Ask about the [facility] or the [facility];   [employer]; [phone] Management consultant; Information regarding Long Term Care  For safety consider chair in tub; Or shower transfer bench                  This is a list of the screening recommended for you and due dates:  Health Maintenance  Topic Date Due  . Flu Shot  03/23/2017  . Tetanus Vaccine  11/15/2020  . DEXA scan (bone density measurement)  Completed  . Pneumonia vaccines  Completed   Influenza Virus Vaccine injection What is this medicine? INFLUENZA VIRUS VACCINE (in floo EN zuh VAHY ruhs vak SEEN) helps to reduce the risk of getting influenza also known as the flu. The vaccine only helps protect you against some strains of the flu. This medicine may be used for other purposes; ask your health care provider or pharmacist if you have questions. COMMON BRAND NAME(S): Afluria, Agriflu, Alfuria, FLUAD,  Fluarix, Fluarix Quadrivalent, Flublok, Flublok Quadrivalent, FLUCELVAX, Flulaval, Fluvirin, Fluzone, Fluzone High-Dose, Fluzone Intradermal What should I tell my health care provider before I take this medicine? They need to know if you have any of these conditions: -bleeding disorder like hemophilia -fever or infection -Guillain-Barre syndrome or other neurological problems -immune system problems -infection with the human immunodeficiency virus (HIV) or AIDS -low blood platelet counts -multiple sclerosis -an unusual or allergic reaction to influenza virus vaccine, latex, other medicines, foods, dyes, or preservatives. Different brands of vaccines contain different allergens. Some may contain latex or eggs. Talk to your doctor about your allergies to make sure that you get the right vaccine. -pregnant or trying to get pregnant -breast-feeding How should I use this medicine? This vaccine is for injection into a muscle or under the skin. It is given by a health care professional. A copy of Vaccine Information Statements will be given before each vaccination. Read this sheet carefully each time. The sheet may change frequently. Talk to your healthcare provider to see which vaccines are right for you. Some vaccines should not be used in all age groups. Overdosage: If you think you have taken too much of this medicine contact a poison control center or emergency room at once. NOTE: This medicine is only for you. Do not share this medicine with others. What if I miss a dose? This does not apply. What may interact with this medicine? -chemotherapy or radiation therapy -medicines that lower your  immune system like etanercept, anakinra, infliximab, and adalimumab -medicines that treat or prevent blood clots like warfarin -phenytoin -steroid medicines like prednisone or cortisone -theophylline -vaccines This list may not describe all possible interactions. Give your health care provider a list  of all the medicines, herbs, non-prescription drugs, or dietary supplements you use. Also tell them if you smoke, drink alcohol, or use illegal drugs. Some items may interact with your medicine. What should I watch for while using this medicine? Report any side effects that do not go away within 3 days to your doctor or health care professional. Call your health care provider if any unusual symptoms occur within 6 weeks of receiving this vaccine. You may still catch the flu, but the illness is not usually as bad. You cannot get the flu from the vaccine. The vaccine will not protect against colds or other illnesses that may cause fever. The vaccine is needed every year. What side effects may I notice from receiving this medicine? Side effects that you should report to your doctor or health care professional as soon as possible: -allergic reactions like skin rash, itching or hives, swelling of the face, lips, or tongue Side effects that usually do not require medical attention (report to your doctor or health care professional if they continue or are bothersome): -fever -headache -muscle aches and pains -pain, tenderness, redness, or swelling at the injection site -tiredness This list may not describe all possible side effects. Call your doctor for medical advice about side effects. You may report side effects to FDA at 1-800-FDA-1088. Where should I keep my medicine? The vaccine will be given by a health care professional in a clinic, pharmacy, doctor's office, or other health care setting. You will not be given vaccine doses to store at home. NOTE: This sheet is a summary. It may not cover all possible information. If you have questions about this medicine, talk to your doctor, pharmacist, or health care provider.  2018 Elsevier/Gold Standard (2015-02-28 10:07:28)  Knee Exercises Ask your health care provider which exercises are safe for you. Do exercises exactly as told by your health care  provider and adjust them as directed. It is normal to feel mild stretching, pulling, tightness, or discomfort as you do these exercises, but you should stop right away if you feel sudden pain or your pain gets worse.Do not begin these exercises until told by your health care provider. STRETCHING AND RANGE OF MOTION EXERCISES These exercises warm up your muscles and joints and improve the movement and flexibility of your knee. These exercises also help to relieve pain, numbness, and tingling. Exercise A: Knee Extension, Prone 1. Lie on your abdomen on a bed. 2. Place your left / right knee just beyond the edge of the surface so your knee is not on the bed. You can put a towel under your left / right thigh just above your knee for comfort. 3. Relax your leg muscles and allow gravity to straighten your knee. You should feel a stretch behind your left / right knee. 4. Hold this position for __________ seconds. 5. Scoot up so your knee is supported between repetitions. Repeat __________ times. Complete this stretch __________ times a day. Exercise B: Knee Flexion, Active  1. Lie on your back with both knees straight. If this causes back discomfort, bend your left / right knee so your foot is flat on the floor. 2. Slowly slide your left / right heel back toward your buttocks until you feel a gentle  stretch in the front of your knee or thigh. 3. Hold this position for __________ seconds. 4. Slowly slide your left / right heel back to the starting position. Repeat __________ times. Complete this exercise __________ times a day. Exercise C: Quadriceps, Prone  1. Lie on your abdomen on a firm surface, such as a bed or padded floor. 2. Bend your left / right knee and hold your ankle. If you cannot reach your ankle or pant leg, loop a belt around your foot and grab the belt instead. 3. Gently pull your heel toward your buttocks. Your knee should not slide out to the side. You should feel a stretch in the  front of your thigh and knee. 4. Hold this position for __________ seconds. Repeat __________ times. Complete this stretch __________ times a day. Exercise D: Hamstring, Supine 1. Lie on your back. 2. Loop a belt or towel over the ball of your left / right foot. The ball of your foot is on the walking surface, right under your toes. 3. Straighten your left / right knee and slowly pull on the belt to raise your leg until you feel a gentle stretch behind your knee. ? Do not let your left / right knee bend while you do this. ? Keep your other leg flat on the floor. 4. Hold this position for __________ seconds. Repeat __________ times. Complete this stretch __________ times a day. STRENGTHENING EXERCISES These exercises build strength and endurance in your knee. Endurance is the ability to use your muscles for a long time, even after they get tired. Exercise E: Quadriceps, Isometric  1. Lie on your back with your left / right leg extended and your other knee bent. Put a rolled towel or small pillow under your knee if told by your health care provider. 2. Slowly tense the muscles in the front of your left / right thigh. You should see your kneecap slide up toward your hip or see increased dimpling just above the knee. This motion will push the back of the knee toward the floor. 3. For __________ seconds, keep the muscle as tight as you can without increasing your pain. 4. Relax the muscles slowly and completely. Repeat __________ times. Complete this exercise __________ times a day. Exercise F: Straight Leg Raises - Quadriceps 1. Lie on your back with your left / right leg extended and your other knee bent. 2. Tense the muscles in the front of your left / right thigh. You should see your kneecap slide up or see increased dimpling just above the knee. Your thigh may even shake a bit. 3. Keep these muscles tight as you raise your leg 4-6 inches (10-15 cm) off the floor. Do not let your knee  bend. 4. Hold this position for __________ seconds. 5. Keep these muscles tense as you lower your leg. 6. Relax your muscles slowly and completely after each repetition. Repeat __________ times. Complete this exercise __________ times a day. Exercise G: Hamstring, Isometric 1. Lie on your back on a firm surface. 2. Bend your left / right knee approximately __________ degrees. 3. Dig your left / right heel into the surface as if you are trying to pull it toward your buttocks. Tighten the muscles in the back of your thighs to dig as hard as you can without increasing any pain. 4. Hold this position for __________ seconds. 5. Release the tension gradually and allow your muscles to relax completely for __________ seconds after each repetition. Repeat __________ times. Complete  this exercise __________ times a day. Exercise H: Hamstring Curls  If told by your health care provider, do this exercise while wearing ankle weights. Begin with __________ weights. Then increase the weight by 1 lb (0.5 kg) increments. Do not wear ankle weights that are more than __________. 1. Lie on your abdomen with your legs straight. 2. Bend your left / right knee as far as you can without feeling pain. Keep your hips flat against the floor. 3. Hold this position for __________ seconds. 4. Slowly lower your leg to the starting position.  Repeat __________ times. Complete this exercise __________ times a day. Exercise I: Squats (Quadriceps) 1. Stand in front of a table, with your feet and knees pointing straight ahead. You may rest your hands on the table for balance but not for support. 2. Slowly bend your knees and lower your hips like you are going to sit in a chair. ? Keep your weight over your heels, not over your toes. ? Keep your lower legs upright so they are parallel with the table legs. ? Do not let your hips go lower than your knees. ? Do not bend lower than told by your health care provider. ? If your  knee pain increases, do not bend as low. 3. Hold the squat position for __________ seconds. 4. Slowly push with your legs to return to standing. Do not use your hands to pull yourself to standing. Repeat __________ times. Complete this exercise __________ times a day. Exercise J: Wall Slides (Quadriceps)  1. Lean your back against a smooth wall or door while you walk your feet out 18-24 inches (46-61 cm) from it. 2. Place your feet hip-width apart. 3. Slowly slide down the wall or door until your knees bend __________ degrees. Keep your knees over your heels, not over your toes. Keep your knees in line with your hips. 4. Hold for __________ seconds. Repeat __________ times. Complete this exercise __________ times a day. Exercise K: Straight Leg Raises - Hip Abductors 1. Lie on your side with your left / right leg in the top position. Lie so your head, shoulder, knee, and hip line up. You may bend your bottom knee to help you keep your balance. 2. Roll your hips slightly forward so your hips are stacked directly over each other and your left / right knee is facing forward. 3. Leading with your heel, lift your top leg 4-6 inches (10-15 cm). You should feel the muscles in your outer hip lifting. ? Do not let your foot drift forward. ? Do not let your knee roll toward the ceiling. 4. Hold this position for __________ seconds. 5. Slowly return your leg to the starting position. 6. Let your muscles relax completely after each repetition. Repeat __________ times. Complete this exercise __________ times a day. Exercise L: Straight Leg Raises - Hip Extensors 1. Lie on your abdomen on a firm surface. You can put a pillow under your hips if that is more comfortable. 2. Tense the muscles in your buttocks and lift your left / right leg about 4-6 inches (10-15 cm). Keep your knee straight as you lift your leg. 3. Hold this position for __________ seconds. 4. Slowly lower your leg to the starting  position. 5. Let your leg relax completely after each repetition. Repeat __________ times. Complete this exercise __________ times a day. This information is not intended to replace advice given to you by your health care provider. Make sure you discuss any questions you have  with your health care provider. Document Released: 06/23/2005 Document Revised: 05/03/2016 Document Reviewed: 06/15/2015 Elsevier Interactive Patient Education  2018 [employer].

## 2017-05-17 NOTE — Progress Notes (Signed)
Medical screening examination/treatment/procedure(s) were performed by non-physician practitioner and as supervising physician I was immediately available for consultation/collaboration. I agree with above. Elizabeth A Crawford, MD 

## 2017-06-01 ENCOUNTER — Encounter: Payer: Medicare Other | Admitting: Internal Medicine

## 2017-06-02 DIAGNOSIS — H3570 Unspecified separation of retinal layers: Secondary | ICD-10-CM | POA: Diagnosis not present

## 2017-06-02 DIAGNOSIS — H35352 Cystoid macular degeneration, left eye: Secondary | ICD-10-CM | POA: Diagnosis not present

## 2017-06-02 DIAGNOSIS — H353121 Nonexudative age-related macular degeneration, left eye, early dry stage: Secondary | ICD-10-CM | POA: Diagnosis not present

## 2017-06-02 DIAGNOSIS — H353221 Exudative age-related macular degeneration, left eye, with active choroidal neovascularization: Secondary | ICD-10-CM | POA: Diagnosis not present

## 2017-06-07 IMAGING — CR DG CHEST 2V
2 series · 2 of 2 positions shown · non-contrast
Comparison: 01/30/2016

CLINICAL DATA: Shortness of breath, dizziness and weakness.

EXAM:
CHEST  2 VIEW

[w chest lat]
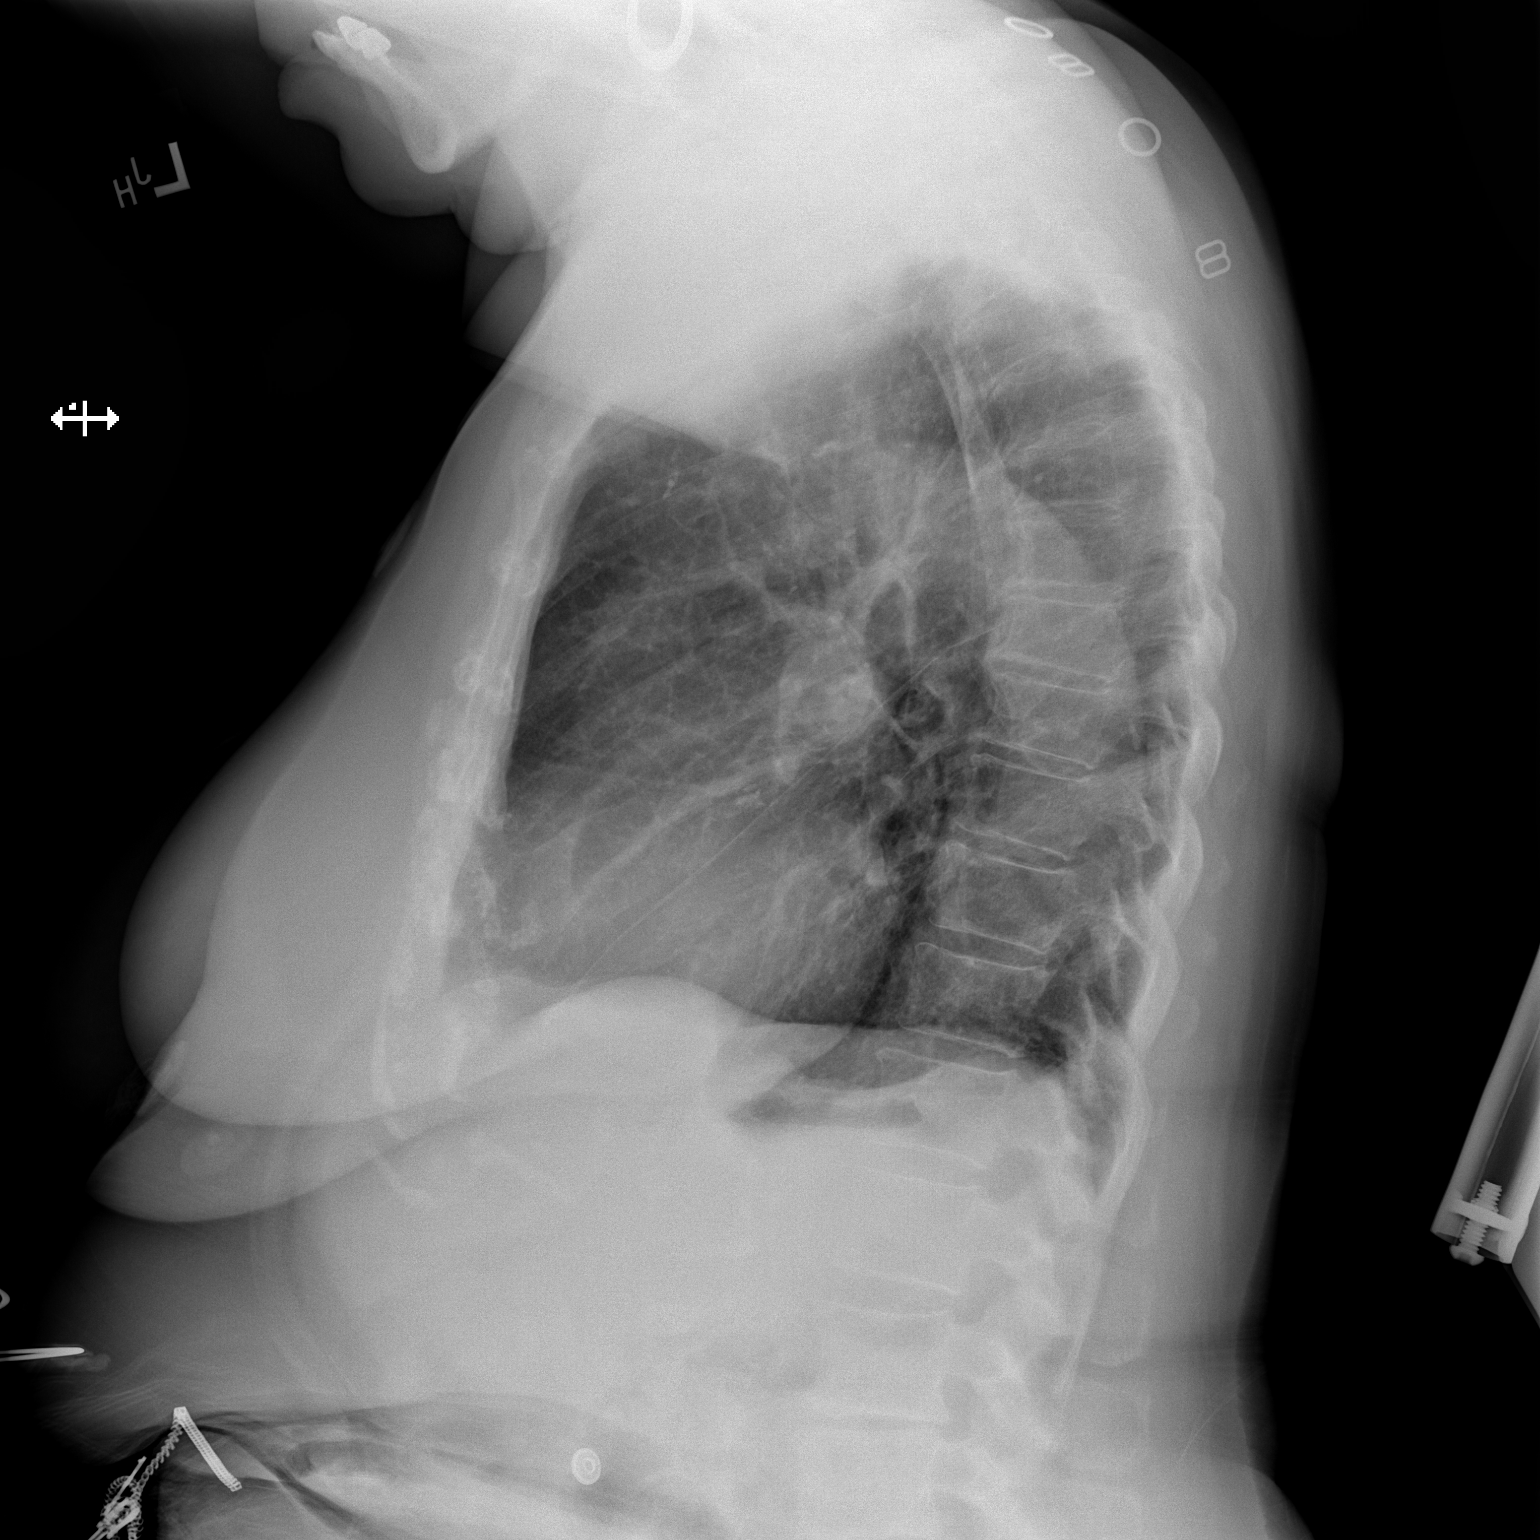

[x chest ap]
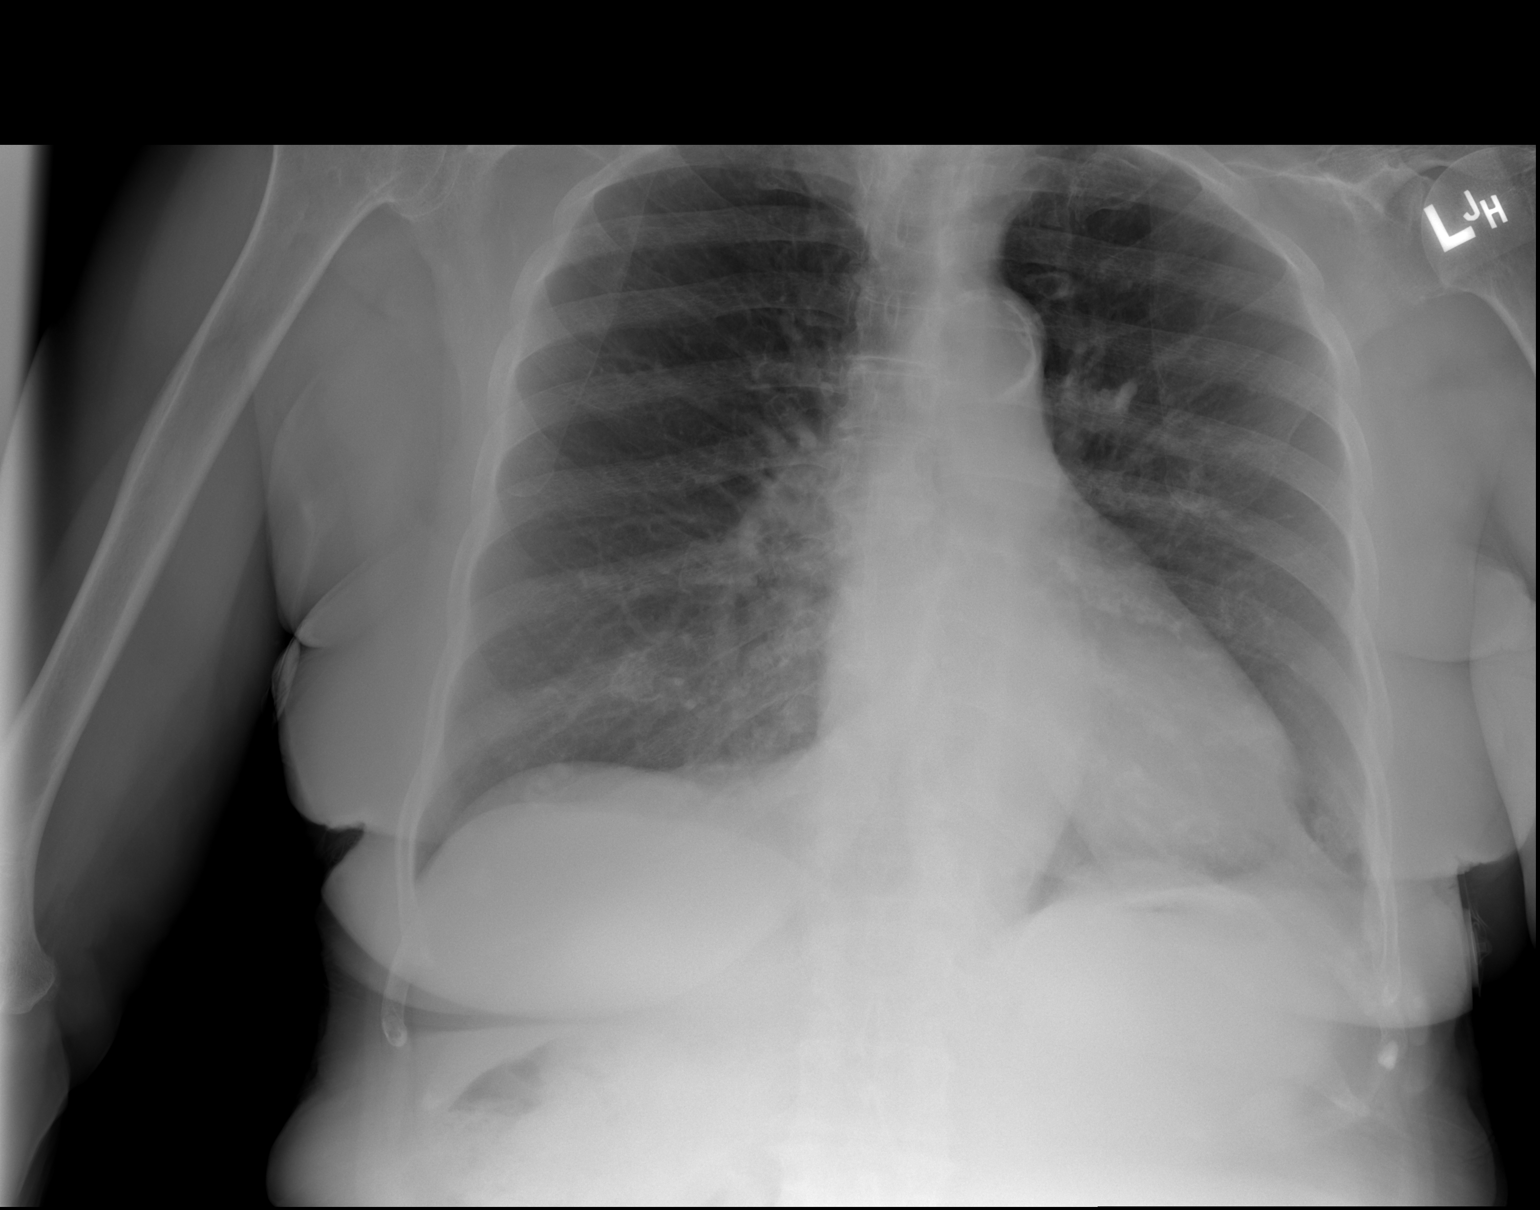

[2 of 2 positions shown; findings below may reference images not displayed]

FINDINGS: The cardiac silhouette is mildly enlarged. Mediastinal contours
appear intact. Tortuosity and calcific atherosclerotic disease of
the aorta.

There is no evidence of focal airspace consolidation, pleural
effusion or pneumothorax.

Osseous structures are without acute abnormality. Soft tissues are
grossly normal.
IMPRESSION: No active cardiopulmonary disease.

Calcific atherosclerotic disease of the aorta.

## 2017-06-15 ENCOUNTER — Ambulatory Visit (INDEPENDENT_AMBULATORY_CARE_PROVIDER_SITE_OTHER): Payer: Medicare Other | Admitting: Internal Medicine

## 2017-06-15 ENCOUNTER — Other Ambulatory Visit (INDEPENDENT_AMBULATORY_CARE_PROVIDER_SITE_OTHER): Payer: Medicare Other

## 2017-06-15 ENCOUNTER — Encounter: Payer: Self-pay | Admitting: Internal Medicine

## 2017-06-15 VITALS — BP 140/84 | HR 85 | Temp 98.5°F | Ht <= 58 in | Wt 143.0 lb

## 2017-06-15 DIAGNOSIS — E782 Mixed hyperlipidemia: Secondary | ICD-10-CM

## 2017-06-15 DIAGNOSIS — J4520 Mild intermittent asthma, uncomplicated: Secondary | ICD-10-CM

## 2017-06-15 DIAGNOSIS — E785 Hyperlipidemia, unspecified: Secondary | ICD-10-CM | POA: Diagnosis not present

## 2017-06-15 DIAGNOSIS — R634 Abnormal weight loss: Secondary | ICD-10-CM

## 2017-06-15 DIAGNOSIS — K219 Gastro-esophageal reflux disease without esophagitis: Secondary | ICD-10-CM

## 2017-06-15 DIAGNOSIS — Z0000 Encounter for general adult medical examination without abnormal findings: Secondary | ICD-10-CM

## 2017-06-15 DIAGNOSIS — I10 Essential (primary) hypertension: Secondary | ICD-10-CM | POA: Diagnosis not present

## 2017-06-15 LAB — CBC
HEMATOCRIT: 38.1 % (ref 36.0–46.0)
Hemoglobin: 12.5 g/dL (ref 12.0–15.0)
MCHC: 32.7 g/dL (ref 30.0–36.0)
MCV: 88.7 fl (ref 78.0–100.0)
Platelets: 242 10*3/uL (ref 150.0–400.0)
RBC: 4.3 Mil/uL (ref 3.87–5.11)
RDW: 14.1 % (ref 11.5–15.5)
WBC: 4.7 10*3/uL (ref 4.0–10.5)

## 2017-06-15 LAB — LIPID PANEL
CHOL/HDL RATIO: 4
Cholesterol: 161 mg/dL (ref 0–200)
HDL: 43.2 mg/dL (ref >39.00)
LDL CALC: 92 mg/dL (ref 0–99)
NonHDL: 118.21
Triglycerides: 132 mg/dL (ref 0.0–149.0)
VLDL: 26.4 mg/dL (ref 0.0–40.0)

## 2017-06-15 LAB — COMPREHENSIVE METABOLIC PANEL
ALT: 11 U/L (ref 0–35)
AST: 15 U/L (ref 0–37)
Albumin: 4 g/dL (ref 3.5–5.2)
Alkaline Phosphatase: 88 U/L (ref 39–117)
BUN: 14 mg/dL (ref 6–23)
CHLORIDE: 103 meq/L (ref 96–112)
CO2: 30 meq/L (ref 19–32)
CREATININE: 1.01 mg/dL (ref 0.40–1.20)
Calcium: 9.6 mg/dL (ref 8.4–10.5)
GFR: 67.44 mL/min (ref >60.00)
GLUCOSE: 75 mg/dL (ref 70–99)
Potassium: 3 mEq/L — ABNORMAL LOW (ref 3.5–5.1)
SODIUM: 142 meq/L (ref 135–145)
Total Bilirubin: 0.3 mg/dL (ref 0.2–1.2)
Total Protein: 7.1 g/dL (ref 6.0–8.3)

## 2017-06-15 LAB — HEMOGLOBIN A1C: Hgb A1c MFr Bld: 5.6 % (ref 4.6–6.5)

## 2017-06-15 MED ORDER — TRIAMCINOLONE ACETONIDE 0.1 % EX CREA: 1.0000 "application " | TOPICAL_CREAM | Freq: Two times a day (BID) | CUTANEOUS | 0 refills | Status: DC

## 2017-06-15 NOTE — Assessment & Plan Note (Signed)
Taking hydralazine, losartan/hctz and checking CMP and adjust as needed.

## 2017-06-15 NOTE — Progress Notes (Signed)
   Subjective:    Patient ID: Stacy Page, female    DOB: [birth year], 83 y.o.   MRN: [id number]  HPI The patient is an 83 YO female coming in for physical.   PMH, [facility], social history reviewed and updated.   Review of Systems  Constitutional: Negative.   HENT: Negative.   Eyes: Negative.   Respiratory: Negative for cough, chest tightness and shortness of breath.   Cardiovascular: Negative for chest pain, palpitations and leg swelling.  Gastrointestinal: Negative for abdominal distention, abdominal pain, constipation, diarrhea, nausea and vomiting.  Musculoskeletal: Negative.   Skin: Negative.   Neurological: Negative.   Psychiatric/Behavioral: Negative.       Objective:   Physical Exam  Constitutional: She is oriented to person, place, and time. She appears well-developed and well-nourished.  HENT:  Head: Normocephalic and atraumatic.  Eyes: EOM are normal.  Neck: Normal range of motion.  Cardiovascular: Normal rate and regular rhythm.   Pulmonary/Chest: Effort normal and breath sounds normal. No respiratory distress. She has no wheezes. She has no rales.  Abdominal: Soft. Bowel sounds are normal. She exhibits no distension. There is no tenderness. There is no rebound.  Musculoskeletal: She exhibits no edema.  Neurological: She is alert and oriented to person, place, and time. Coordination normal.  Skin: Skin is warm and dry.  Psychiatric: She has a normal mood and affect.   Vitals:   06/15/17 1035  BP: 140/84  Pulse: 85  Temp: 98.5 F (36.9 C)  TempSrc: Oral  SpO2: 100%  Weight: 143 lb (64.9 kg)  Height: 4\' 10"  (1.473 m)      Assessment & Plan:

## 2017-06-15 NOTE — Patient Instructions (Signed)
We will check the labs today.  Health Maintenance, Female Adopting a healthy lifestyle and getting preventive care can go a long way to promote health and wellness. Talk with your health care provider about what schedule of regular examinations is right for you. This is a good chance for you to check in with your provider about disease prevention and staying healthy. In between checkups, there are plenty of things you can do on your own. Experts have done a lot of research about which lifestyle changes and preventive measures are most likely to keep you healthy. Ask your health care provider for more information. Weight and diet Eat a healthy diet  Be sure to include plenty of vegetables, fruits, low-fat dairy products, and lean protein.  Do not eat a lot of foods high in solid fats, added sugars, or salt.  Get regular exercise. This is one of the most important things you can do for your health. ? Most adults should exercise for at least 150 minutes each week. The exercise should increase your heart rate and make you sweat (moderate-intensity exercise). ? Most adults should also do strengthening exercises at least twice a week. This is in addition to the moderate-intensity exercise.  Maintain a healthy weight  Body mass index (BMI) is a measurement that can be used to identify possible weight problems. It estimates body fat based on height and weight. Your health care provider can help determine your BMI and help you achieve or maintain a healthy weight.  For females 20 years of age and older: ? A BMI below 18.5 is considered underweight. ? A BMI of 18.5 to 24.9 is normal. ? A BMI of 25 to 29.9 is considered overweight. ? A BMI of 30 and above is considered obese.  Watch levels of cholesterol and blood lipids  You should start having your blood tested for lipids and cholesterol at 81 years of age, then have this test every 5 years.  You may need to have your cholesterol levels checked  more often if: ? Your lipid or cholesterol levels are high. ? You are older than 81 years of age. ? You are at high risk for heart disease.  Cancer screening Lung Cancer  Lung cancer screening is recommended for adults 55-80 years old who are at high risk for lung cancer because of a history of smoking.  A yearly low-dose CT scan of the lungs is recommended for people who: ? Currently smoke. ? Have quit within the past 15 years. ? Have at least a 30-pack-year history of smoking. A pack year is smoking an average of one pack of cigarettes a day for 1 year.  Yearly screening should continue until it has been 15 years since you quit.  Yearly screening should stop if you develop a health problem that would prevent you from having lung cancer treatment.  Breast Cancer  Practice breast self-awareness. This means understanding how your breasts normally appear and feel.  It also means doing regular breast self-exams. Let your health care provider know about any changes, no matter how small.  If you are in your 20s or 30s, you should have a clinical breast exam (CBE) by a health care provider every 1-3 years as part of a regular health exam.  If you are 40 or older, have a CBE every year. Also consider having a breast X-ray (mammogram) every year.  If you have a family history of breast cancer, talk to your health care provider about genetic screening.    If you are at high risk for breast cancer, talk to your health care provider about having an MRI and a mammogram every year.  Breast cancer gene (BRCA) assessment is recommended for women who have family members with BRCA-related cancers. BRCA-related cancers include: ? Breast. ? Ovarian. ? Tubal. ? Peritoneal cancers.  Results of the assessment will determine the need for genetic counseling and BRCA1 and BRCA2 testing.  Cervical Cancer Your health care provider may recommend that you be screened regularly for cancer of the pelvic  organs (ovaries, uterus, and vagina). This screening involves a pelvic examination, including checking for microscopic changes to the surface of your cervix (Pap test). You may be encouraged to have this screening done every 3 years, beginning at age 21.  For women ages 30-65, health care providers may recommend pelvic exams and Pap testing every 3 years, or they may recommend the Pap and pelvic exam, combined with testing for human papilloma virus (HPV), every 5 years. Some types of HPV increase your risk of cervical cancer. Testing for HPV may also be done on women of any age with unclear Pap test results.  Other health care providers may not recommend any screening for nonpregnant women who are considered low risk for pelvic cancer and who do not have symptoms. Ask your health care provider if a screening pelvic exam is right for you.  If you have had past treatment for cervical cancer or a condition that could lead to cancer, you need Pap tests and screening for cancer for at least 20 years after your treatment. If Pap tests have been discontinued, your risk factors (such as having a new sexual partner) need to be reassessed to determine if screening should resume. Some women have medical problems that increase the chance of getting cervical cancer. In these cases, your health care provider may recommend more frequent screening and Pap tests.  Colorectal Cancer  This type of cancer can be detected and often prevented.  Routine colorectal cancer screening usually begins at 81 years of age and continues through 81 years of age.  Your health care provider may recommend screening at an earlier age if you have risk factors for colon cancer.  Your health care provider may also recommend using home test kits to check for hidden blood in the stool.  A small camera at the end of a tube can be used to examine your colon directly (sigmoidoscopy or colonoscopy). This is done to check for the earliest forms  of colorectal cancer.  Routine screening usually begins at age 50.  Direct examination of the colon should be repeated every 5-10 years through 81 years of age. However, you may need to be screened more often if early forms of precancerous polyps or small growths are found.  Skin Cancer  Check your skin from head to toe regularly.  Tell your health care provider about any new moles or changes in moles, especially if there is a change in a mole's shape or color.  Also tell your health care provider if you have a mole that is larger than the size of a pencil eraser.  Always use sunscreen. Apply sunscreen liberally and repeatedly throughout the day.  Protect yourself by wearing long sleeves, pants, a wide-brimmed hat, and sunglasses whenever you are outside.  Heart disease, diabetes, and high blood pressure  High blood pressure causes heart disease and increases the risk of stroke. High blood pressure is more likely to develop in: ? People who have blood   pressure in the high end of the normal range (130-139/85-89 mm Hg). ? People who are overweight or obese. ? People who are African American.  If you are 18-39 years of age, have your blood pressure checked every 3-5 years. If you are 40 years of age or older, have your blood pressure checked every year. You should have your blood pressure measured twice-once when you are at a hospital or clinic, and once when you are not at a hospital or clinic. Record the average of the two measurements. To check your blood pressure when you are not at a hospital or clinic, you can use: ? An automated blood pressure machine at a pharmacy. ? A home blood pressure monitor.  If you are between 55 years and 79 years old, ask your health care provider if you should take aspirin to prevent strokes.  Have regular diabetes screenings. This involves taking a blood sample to check your fasting blood sugar level. ? If you are at a normal weight and have a low risk  for diabetes, have this test once every three years after 81 years of age. ? If you are overweight and have a high risk for diabetes, consider being tested at a younger age or more often. Preventing infection Hepatitis B  If you have a higher risk for hepatitis B, you should be screened for this virus. You are considered at high risk for hepatitis B if: ? You were born in a country where hepatitis B is common. Ask your health care provider which countries are considered high risk. ? Your parents were born in a high-risk country, and you have not been immunized against hepatitis B (hepatitis B vaccine). ? You have HIV or AIDS. ? You use needles to inject street drugs. ? You live with someone who has hepatitis B. ? You have had sex with someone who has hepatitis B. ? You get hemodialysis treatment. ? You take certain medicines for conditions, including cancer, organ transplantation, and autoimmune conditions.  Hepatitis C  Blood testing is recommended for: ? Everyone born from 1945 through 1965. ? Anyone with known risk factors for hepatitis C.  Sexually transmitted infections (STIs)  You should be screened for sexually transmitted infections (STIs) including gonorrhea and chlamydia if: ? You are sexually active and are younger than 81 years of age. ? You are older than 81 years of age and your health care provider tells you that you are at risk for this type of infection. ? Your sexual activity has changed since you were last screened and you are at an increased risk for chlamydia or gonorrhea. Ask your health care provider if you are at risk.  If you do not have HIV, but are at risk, it may be recommended that you take a prescription medicine daily to prevent HIV infection. This is called pre-exposure prophylaxis (PrEP). You are considered at risk if: ? You are sexually active and do not regularly use condoms or know the HIV status of your partner(s). ? You take drugs by  injection. ? You are sexually active with a partner who has HIV.  Talk with your health care provider about whether you are at high risk of being infected with HIV. If you choose to begin PrEP, you should first be tested for HIV. You should then be tested every 3 months for as long as you are taking PrEP. Pregnancy  If you are premenopausal and you may become pregnant, ask your health care provider about preconception   about preconception counseling.  If you may become pregnant, take 400 to 800 micrograms (mcg) of folic acid every day.  If you want to prevent pregnancy, talk to your health care provider about birth control (contraception). Osteoporosis and menopause  Osteoporosis is a disease in which the bones lose minerals and strength with aging. This can result in serious bone fractures. Your risk for osteoporosis can be identified using a bone density scan.  If you are 6 years of age or older, or if you are at risk for osteoporosis and fractures, ask your health care provider if you should be screened.  Ask your health care provider whether you should take a calcium or vitamin D supplement to lower your risk for osteoporosis.  Menopause may have certain physical symptoms and risks.  Hormone replacement therapy may reduce some of these symptoms and risks. Talk to your health care provider about whether hormone replacement therapy is right for you. Follow these instructions at home:  Schedule regular health, dental, and eye exams.  Stay current with your immunizations.  Do not use any tobacco products including cigarettes, chewing tobacco, or electronic cigarettes.  If you are pregnant, do not drink alcohol.  If you are breastfeeding, limit how much and how often you drink alcohol.  Limit alcohol intake to no more than 1 drink per day for nonpregnant women. One drink equals 12 ounces of beer, 5 ounces of wine, or 1 ounces of hard liquor.  Do not use street drugs.  Do not share needles.  Ask  your health care provider for help if you need support or information about quitting drugs.  Tell your health care provider if you often feel depressed.  Tell your health care provider if you have ever been abused or do not feel safe at home. This information is not intended to replace advice given to you by your health care provider. Make sure you discuss any questions you have with your health care provider. Document Released: 02/22/2011 Document Revised: 01/15/2016 Document Reviewed: 05/13/2015 Elsevier Interactive Patient Education  [employer].

## 2017-06-15 NOTE — Assessment & Plan Note (Signed)
Taking protonix daily and benefits outweigh the risk.

## 2017-06-15 NOTE — Assessment & Plan Note (Signed)
Taking statin three times weekly. Checking lipid panel and adjust as needed.

## 2017-06-15 NOTE — Assessment & Plan Note (Signed)
Albuterol prn without flare today. Singulair and flonase continue.

## 2017-06-15 NOTE — Assessment & Plan Note (Signed)
Flu and tetanus and pneumonia is up to date. Colonoscopy aged out and mammogram aged out. She is not exercising and counseled on the importance of this and given some chair exercises to start. Counseled on sun safety and mole surveillance. Given 10 year screening recommendations.

## 2017-06-15 NOTE — Assessment & Plan Note (Signed)
Stable now and no more weight loss, appetite stable.

## 2017-06-16 ENCOUNTER — Other Ambulatory Visit: Payer: Self-pay | Admitting: *Deleted

## 2017-06-16 MED ORDER — MONTELUKAST SODIUM 10 MG PO TABS: 10.0000 mg | ORAL_TABLET | Freq: Every day | ORAL | 1 refills | Status: DC

## 2017-06-16 MED ORDER — CLOPIDOGREL BISULFATE 75 MG PO TABS: 75.0000 mg | ORAL_TABLET | Freq: Every day | ORAL | 1 refills | Status: DC

## 2017-06-16 MED ORDER — ATORVASTATIN CALCIUM 40 MG PO TABS: ORAL_TABLET | ORAL | 1 refills | Status: DC

## 2017-06-16 MED ORDER — HYDRALAZINE HCL 100 MG PO TABS: 100.0000 mg | ORAL_TABLET | Freq: Three times a day (TID) | ORAL | 1 refills | Status: DC

## 2017-06-16 MED ORDER — PANTOPRAZOLE SODIUM 20 MG PO TBEC: 20.0000 mg | DELAYED_RELEASE_TABLET | Freq: Every day | ORAL | 1 refills | Status: DC

## 2017-06-16 MED ORDER — LOSARTAN POTASSIUM-HCTZ 100-25 MG PO TABS: 1.0000 | ORAL_TABLET | Freq: Every day | ORAL | 3 refills | Status: DC

## 2017-06-30 DIAGNOSIS — H3570 Unspecified separation of retinal layers: Secondary | ICD-10-CM | POA: Diagnosis not present

## 2017-06-30 DIAGNOSIS — H353121 Nonexudative age-related macular degeneration, left eye, early dry stage: Secondary | ICD-10-CM | POA: Diagnosis not present

## 2017-06-30 DIAGNOSIS — H35352 Cystoid macular degeneration, left eye: Secondary | ICD-10-CM | POA: Diagnosis not present

## 2017-06-30 DIAGNOSIS — H353221 Exudative age-related macular degeneration, left eye, with active choroidal neovascularization: Secondary | ICD-10-CM | POA: Diagnosis not present

## 2017-07-26 ENCOUNTER — Telehealth: Payer: Self-pay | Admitting: Interventional Cardiology

## 2017-07-26 NOTE — Telephone Encounter (Signed)
New Message  Pt call requesting to speak with RN to f/u on medical record request form. She states Dr. Tamala Julian will have to fill out the information .please call back to discuss if needed

## 2017-08-11 DIAGNOSIS — H3322 Serous retinal detachment, left eye: Secondary | ICD-10-CM | POA: Diagnosis not present

## 2017-08-11 DIAGNOSIS — H353221 Exudative age-related macular degeneration, left eye, with active choroidal neovascularization: Secondary | ICD-10-CM | POA: Diagnosis not present

## 2017-08-11 DIAGNOSIS — H35352 Cystoid macular degeneration, left eye: Secondary | ICD-10-CM | POA: Diagnosis not present

## 2017-08-11 DIAGNOSIS — H353121 Nonexudative age-related macular degeneration, left eye, early dry stage: Secondary | ICD-10-CM | POA: Diagnosis not present

## 2017-08-11 DIAGNOSIS — H3570 Unspecified separation of retinal layers: Secondary | ICD-10-CM | POA: Diagnosis not present

## 2017-09-15 DIAGNOSIS — H353221 Exudative age-related macular degeneration, left eye, with active choroidal neovascularization: Secondary | ICD-10-CM | POA: Diagnosis not present

## 2017-09-15 DIAGNOSIS — H3570 Unspecified separation of retinal layers: Secondary | ICD-10-CM | POA: Diagnosis not present

## 2017-09-15 DIAGNOSIS — H353121 Nonexudative age-related macular degeneration, left eye, early dry stage: Secondary | ICD-10-CM | POA: Diagnosis not present

## 2017-09-28 ENCOUNTER — Ambulatory Visit: Payer: Medicare Other | Admitting: Interventional Cardiology

## 2017-09-28 NOTE — Progress Notes (Deleted)
Cardiology Office Note    Date:  09/28/2017   ID:  Stacy Page, DOB [birth year], MRN [id number]  PCP:  Hoyt Koch, MD  Cardiologist: Sinclair Grooms, MD   No chief complaint on file.   History of Present Illness:  Stacy Page is a 84 y.o. female  presents for essential hypertension, and multiple medication intolerances.      Past Medical History:  Diagnosis Date  . Allergy    seasonal  . Asthma   . GERD (gastroesophageal reflux disease)   . Hyperlipidemia   . Hypertension   . Transient cerebral ischemia     Past Surgical History:  Procedure Laterality Date  . ABDOMINAL HYSTERECTOMY     for fibroids (no BSO)  . BUNIONECTOMY Right   . CARPAL TUNNEL RELEASE     Bilaterally  . COLONOSCOPY  2011   tiny polyp ; [facility]  . Macular Degeneration     Dr Stacy Page  ; injections  . UMBILICAL HERNIA REPAIR      Current Medications: Outpatient Medications Prior to Visit  Medication Sig Dispense Refill  . acetaminophen (TYLENOL) 500 MG tablet Take 500 mg by mouth every 6 (six) hours as needed.    Marland Kitchen albuterol (PROVENTIL HFA;VENTOLIN HFA) 108 (90 Base) MCG/ACT inhaler Inhale 1-2 puffs into the lungs every 4 (four) hours as needed for wheezing or shortness of breath.    Marland Kitchen atorvastatin (LIPITOR) 40 MG tablet TAKE 1/2 TABLET BY MOUTH  EVERY MONDAY, WEDNESDAY,  FRIDAY, AND SUNDAY 26 tablet 1  . azelastine (ASTELIN) 0.1 % nasal spray Place 2 sprays into both nostrils at bedtime as needed. (allergies)    . clopidogrel (PLAVIX) 75 MG tablet Take 1 tablet (75 mg total) by mouth daily. 90 tablet 1  . co-enzyme Q-10 30 MG capsule Take 30 mg by mouth daily.     . cycloSPORINE (RESTASIS) 0.05 % ophthalmic emulsion Place 1 drop into both eyes 2 (two) times daily.    . feeding supplement, ENSURE ENLIVE, (ENSURE ENLIVE) LIQD Take 237 mLs by mouth 2 (two) times daily between meals. 237 mL 12  . fluticasone (FLONASE) 50 MCG/ACT nasal spray Place 2 sprays into both nostrils  daily as needed for allergies or rhinitis.    Marland Kitchen gabapentin (NEURONTIN) 100 MG capsule Take 100 mg by mouth 3 (three) times daily as needed (leg and lower back pain).    . hydrALAZINE (APRESOLINE) 100 MG tablet Take 1 tablet (100 mg total) by mouth 3 (three) times daily. 270 tablet 1  . levocetirizine (XYZAL) 5 MG tablet Take 1 tablet (5 mg total) by mouth every evening. 30 tablet 3  . LORazepam (ATIVAN) 0.5 MG tablet Take 1 tablet (0.5 mg total) by mouth daily as needed for anxiety. 30 tablet 0  . losartan-hydrochlorothiazide (HYZAAR) 100-25 MG tablet Take 1 tablet by mouth daily. 90 tablet 3  . meclizine (ANTIVERT) 25 MG tablet TAKE 1/2 TO 1 TABLET BY  MOUTH 3 TIMES DAILY AS  NEEDED FOR DIZZINESS OR  NAUSEA 180 tablet 0  . montelukast (SINGULAIR) 10 MG tablet Take 1 tablet (10 mg total) by mouth at bedtime. 90 tablet 1  . pantoprazole (PROTONIX) 20 MG tablet Take 1 tablet (20 mg total) by mouth daily. 90 tablet 1  . triamcinolone cream (KENALOG) 0.1 % Apply 1 application topically 2 (two) times daily. 30 g 0   No facility-administered medications prior to visit.      Allergies:   Bystolic [nebivolol hcl]; Aspirin;  Cardizem [diltiazem hcl]; Amlodipine; Metoclopramide hcl; and Spironolactone   Social History   Socioeconomic History  . Marital status: Divorced    Spouse name: Not on file  . Number of children: Not on file  . Years of education: Not on file  . Highest education level: Not on file  Social Needs  . Financial resource strain: Not on file  . Food insecurity - worry: Not on file  . Food insecurity - inability: Not on file  . Transportation needs - medical: Not on file  . Transportation needs - non-medical: Not on file  Occupational History  . Not on file  Tobacco Use  . Smoking status: Former Smoker    Types: Cigarettes    Last attempt to quit: 08/24/1979    Years since quitting: 38.1  . Smokeless tobacco: Never Used  . Tobacco comment: smoked  1966-1981, up to 1 ppd    Substance and Sexual Activity  . Alcohol use: No    Alcohol/week: 0.0 oz  . Drug use: No  . Sexual activity: Not on file  Other Topics Concern  . Not on file  Social History Narrative  . Not on file     Family History:  The patient's ***family history includes Alzheimer's disease in her mother; Diabetes in her unknown relative; Heart attack in her father; Heart attack (age of onset: 13) in her maternal grandmother; Hypertension in her father, maternal grandmother, and mother; Stroke (age of onset: 72) in her mother.   ROS:   Please see the history of present illness.    ***  All other systems reviewed and are negative.   PHYSICAL EXAM:   VS:  There were no vitals taken for this visit.   GEN: Well nourished, well developed, in no acute distress  HEENT: normal  Neck: no JVD, carotid bruits, or masses Cardiac: ***RRR; no murmurs, rubs, or gallops,no edema  Respiratory:  clear to auscultation bilaterally, normal work of breathing GI: soft, nontender, nondistended, + BS MS: no deformity or atrophy  Skin: warm and dry, no rash Neuro:  Alert and Oriented x 3, Strength and sensation are intact Psych: euthymic mood, full affect  Wt Readings from Last 3 Encounters:  06/15/17 143 lb (64.9 kg)  05/17/17 142 lb (64.4 kg)  12/09/16 145 lb (65.8 kg)      Studies/Labs Reviewed:   EKG:  EKG  ***  Recent Labs: 06/15/2017: ALT 11; BUN 14; Creatinine, Ser 1.01; Hemoglobin 12.5; Platelets 242.0; Potassium 3.0; Sodium 142   Lipid Panel    Component Value Date/Time   CHOL 161 06/15/2017 1109   CHOL 262 (H) 02/12/2014 1224   TRIG 132.0 06/15/2017 1109   TRIG 77 02/12/2014 1224   HDL 43.20 06/15/2017 1109   HDL 58 02/12/2014 1224   CHOLHDL 4 06/15/2017 1109   VLDL 26.4 06/15/2017 1109   LDLCALC 92 06/15/2017 1109   LDLCALC 189 (H) 02/12/2014 1224   LDLDIRECT 165.5 09/24/2009 1052    Additional studies/ records that were reviewed today include:  ***    ASSESSMENT:    1.  HYPERLIPIDEMIA   2. Accelerated hypertension   3. TIA (transient ischemic attack)      PLAN:  In order of problems listed above:  1. ***    Medication Adjustments/Labs and Tests Ordered: Current medicines are reviewed at length with the patient today.  Concerns regarding medicines are outlined above.  Medication changes, Labs and Tests ordered today are listed in the Patient Instructions below. There are  no Patient Instructions on file for this visit.   Signed, Sinclair Grooms, MD  09/28/2017 1:00 PM    [address], [place], [place]  [address] Phone: [phone]; Fax: [phone]

## 2017-10-17 ENCOUNTER — Other Ambulatory Visit: Payer: Self-pay | Admitting: Internal Medicine

## 2017-10-26 NOTE — Progress Notes (Signed)
Cardiology Office Note    Date:  10/27/2017   ID:  Stacy Page, DOB Jul 18, 1933, MRN [id number]  PCP:  Hoyt Koch, MD  Cardiologist: Sinclair Grooms, MD   Chief Complaint  Patient presents with  . Hypertension    History of Present Illness:  Stacy Page is a 84 y.o. female presents for essential hypertension, and multiple medication intolerances.   The patient is here today stating that she feels well.  I have been seeing her only for hypertension.  Last visit with me was over a year ago.  We had her in the blood pressure clinic in 2017 and had blood pressure under pretty good control.  When I saw her one year ago she told me about admission to the hospital with dehydration.  The medication regimen last year included Hyzaar 100/25 mg.  According to the med list she should still be on this therapy but she is not.  Her bottle is for losartan 100 mg which she has discontinued because she heard some things on television about contamination.  Therefore, she is currently on Apresoline 100 mg 3 times daily.  Therefore we have confusion.  There are prior medication intolerances including aspirin, diltiazem, amlodipine, and spironolactone.   Past Medical History:  Diagnosis Date  . Allergy    seasonal  . Asthma   . GERD (gastroesophageal reflux disease)   . Hyperlipidemia   . Hypertension   . Transient cerebral ischemia     Past Surgical History:  Procedure Laterality Date  . ABDOMINAL HYSTERECTOMY     for fibroids (no BSO)  . BUNIONECTOMY Right   . CARPAL TUNNEL RELEASE     Bilaterally  . COLONOSCOPY  2011   tiny polyp ; [facility]  . Macular Degeneration     Dr Zadie Rhine  ; injections  . UMBILICAL HERNIA REPAIR      Current Medications: Outpatient Medications Prior to Visit  Medication Sig Dispense Refill  . acetaminophen (TYLENOL) 500 MG tablet Take 500 mg by mouth every 6 (six) hours as needed.    Marland Kitchen albuterol (PROVENTIL HFA;VENTOLIN HFA) 108 (90 Base)  MCG/ACT inhaler Inhale 1-2 puffs into the lungs every 4 (four) hours as needed for wheezing or shortness of breath.    Marland Kitchen atorvastatin (LIPITOR) 40 MG tablet TAKE 1/2 TABLET BY MOUTH  EVERY MONDAY, WEDNESDAY,  FRIDAY, AND SUNDAY 26 tablet 1  . azelastine (ASTELIN) 0.1 % nasal spray Place 2 sprays into both nostrils at bedtime as needed. (allergies)    . clopidogrel (PLAVIX) 75 MG tablet TAKE 1 TABLET BY MOUTH  DAILY 90 tablet 1  . co-enzyme Q-10 30 MG capsule Take 30 mg by mouth daily.     . cycloSPORINE (RESTASIS) 0.05 % ophthalmic emulsion Place 1 drop into both eyes 2 (two) times daily.    . feeding supplement, ENSURE ENLIVE, (ENSURE ENLIVE) LIQD Take 237 mLs by mouth 2 (two) times daily between meals. 237 mL 12  . fluticasone (FLONASE) 50 MCG/ACT nasal spray Place 2 sprays into both nostrils daily as needed for allergies or rhinitis.    Marland Kitchen gabapentin (NEURONTIN) 100 MG capsule Take 100 mg by mouth 3 (three) times daily as needed (leg and lower back pain).    . hydrALAZINE (APRESOLINE) 100 MG tablet Take 1 tablet (100 mg total) by mouth 3 (three) times daily. 270 tablet 1  . levocetirizine (XYZAL) 5 MG tablet Take 1 tablet (5 mg total) by mouth every evening. 30 tablet 3  .  LORazepam (ATIVAN) 0.5 MG tablet Take 1 tablet (0.5 mg total) by mouth daily as needed for anxiety. 30 tablet 0  . meclizine (ANTIVERT) 25 MG tablet TAKE 1/2 TO 1 TABLET BY  MOUTH 3 TIMES DAILY AS  NEEDED FOR DIZZINESS OR  NAUSEA 180 tablet 0  . montelukast (SINGULAIR) 10 MG tablet Take 1 tablet (10 mg total) by mouth at bedtime. 90 tablet 1  . pantoprazole (PROTONIX) 20 MG tablet Take 1 tablet (20 mg total) by mouth daily. 90 tablet 1  . triamcinolone cream (KENALOG) 0.1 % Apply 1 application topically 2 (two) times daily. 30 g 0  . losartan-hydrochlorothiazide (HYZAAR) 100-25 MG tablet Take 1 tablet by mouth daily. 90 tablet 3   No facility-administered medications prior to visit.      Allergies:   Bystolic [nebivolol  hcl]; Aspirin; Cardizem [diltiazem hcl]; Amlodipine; Metoclopramide hcl; and Spironolactone   Social History   Socioeconomic History  . Marital status: Divorced    Spouse name: None  . Number of children: None  . Years of education: None  . Highest education level: None  Social Needs  . Financial resource strain: None  . Food insecurity - worry: None  . Food insecurity - inability: None  . Transportation needs - medical: None  . Transportation needs - non-medical: None  Occupational History  . None  Tobacco Use  . Smoking status: Former Smoker    Types: Cigarettes    Last attempt to quit: 08/24/1979    Years since quitting: 38.2  . Smokeless tobacco: Never Used  . Tobacco comment: smoked  1966-1981, up to 1 ppd  Substance and Sexual Activity  . Alcohol use: No    Alcohol/week: 0.0 oz  . Drug use: No  . Sexual activity: None  Other Topics Concern  . None  Social History Narrative  . None     Family History:  The patient's family history includes Alzheimer's disease in her mother; Diabetes in her unknown relative; Heart attack in her father; Heart attack (age of onset: 35) in her maternal grandmother; Hypertension in her father, maternal grandmother, and mother; Stroke (age of onset: 77) in her mother.   ROS:   Please see the history of present illness.    No complaints. All other systems reviewed and are negative.   PHYSICAL EXAM:   VS:  BP (!) 166/100   Pulse 70   Ht 4\' 10"  (1.473 m)   Wt 144 lb (65.3 kg)   BMI 30.10 kg/m    GEN: Well nourished, well developed, in no acute distress  HEENT: normal  Neck: no JVD, carotid bruits, or masses Cardiac: S4 gallop is audible.  RRR; no murmurs, rubs, ,no edema. Respiratory:  clear to auscultation bilaterally, normal work of breathing GI: soft, nontender, nondistended, + BS MS: no deformity or atrophy  Skin: warm and dry, no rash Neuro:  Alert and Oriented x 3, Strength and sensation are intact Psych: euthymic mood,  full affect  Wt Readings from Last 3 Encounters:  10/27/17 144 lb (65.3 kg)  06/15/17 143 lb (64.9 kg)  05/17/17 142 lb (64.4 kg)      Studies/Labs Reviewed:   EKG:  EKG normal sinus rhythm tall R wave V1, left axis deviation, and no acute abnormality noted.  When compared to prior January 2018, heart rate is slower.  Otherwise no changes are noted.  Recent Labs: 06/15/2017: ALT 11; BUN 14; Creatinine, Ser 1.01; Hemoglobin 12.5; Platelets 242.0; Potassium 3.0; Sodium 142  Lipid Panel    Component Value Date/Time   CHOL 161 06/15/2017 1109   CHOL 262 (H) 02/12/2014 1224   TRIG 132.0 06/15/2017 1109   TRIG 77 02/12/2014 1224   HDL 43.20 06/15/2017 1109   HDL 58 02/12/2014 1224   CHOLHDL 4 06/15/2017 1109   VLDL 26.4 06/15/2017 1109   LDLCALC 92 06/15/2017 1109   LDLCALC 189 (H) 02/12/2014 1224   LDLDIRECT 165.5 09/24/2009 1052    Additional studies/ records that were reviewed today include:  No new data    ASSESSMENT:    1. Essential hypertension   2. HYPERLIPIDEMIA   3. Hypokalemia   4. TIA (transient ischemic attack)      PLAN:  In order of problems listed above:  1. This is purely blood pressure situation.  We will get her back into blood pressure clinic.  She will likely need diuretic added.  Considering her story of dehydration on Hyzaar 100/25 mg we will reinstitute low-dose diuretic therapy after resuming losartan 100 mg/day.  Once pressures under control she can resume management by her primary physician.  Plan no follow-up.    Medication Adjustments/Labs and Tests Ordered: Current medicines are reviewed at length with the patient today.  Concerns regarding medicines are outlined above.  Medication changes, Labs and Tests ordered today are listed in the Patient Instructions below. Patient Instructions  Medication Instructions:  1) START Losartan 100mg  once daily  Labwork: None  Testing/Procedures: None  Follow-Up: Your physician recommends that  you schedule a follow-up appointment in: 1 month with the Hypertension Clinic.  Your physician recommends that you schedule a follow-up appointment as needed with Dr. Tamala Julian.     Any Other Special Instructions Will Be Listed Below (If Applicable).     If you need a refill on your cardiac medications before your next appointment, please call your pharmacy.      Signed, Sinclair Grooms, MD  10/27/2017 12:10 PM    [facility] [address], [place], [place]  [address] Phone: [phone]; Fax: [phone]

## 2017-10-27 ENCOUNTER — Encounter: Payer: Self-pay | Admitting: Interventional Cardiology

## 2017-10-27 ENCOUNTER — Ambulatory Visit: Payer: Medicare Other | Admitting: Interventional Cardiology

## 2017-10-27 VITALS — BP 166/100 | HR 70 | Ht <= 58 in | Wt 144.0 lb

## 2017-10-27 DIAGNOSIS — E876 Hypokalemia: Secondary | ICD-10-CM

## 2017-10-27 DIAGNOSIS — E782 Mixed hyperlipidemia: Secondary | ICD-10-CM | POA: Diagnosis not present

## 2017-10-27 DIAGNOSIS — G459 Transient cerebral ischemic attack, unspecified: Secondary | ICD-10-CM | POA: Diagnosis not present

## 2017-10-27 DIAGNOSIS — I10 Essential (primary) hypertension: Secondary | ICD-10-CM

## 2017-10-27 MED ORDER — LOSARTAN POTASSIUM 100 MG PO TABS: 100.0000 mg | ORAL_TABLET | Freq: Every day | ORAL | 3 refills | Status: DC

## 2017-10-27 NOTE — Patient Instructions (Signed)
Medication Instructions:  1) START Losartan 100mg  once daily  Labwork: None  Testing/Procedures: None  Follow-Up: Your physician recommends that you schedule a follow-up appointment in: 1 month with the Hypertension Clinic.  Your physician recommends that you schedule a follow-up appointment as needed with Dr. Tamala Julian.     Any Other Special Instructions Will Be Listed Below (If Applicable).     If you need a refill on your cardiac medications before your next appointment, please call your pharmacy.

## 2017-11-01 DIAGNOSIS — H353221 Exudative age-related macular degeneration, left eye, with active choroidal neovascularization: Secondary | ICD-10-CM | POA: Diagnosis not present

## 2017-11-01 DIAGNOSIS — H353121 Nonexudative age-related macular degeneration, left eye, early dry stage: Secondary | ICD-10-CM | POA: Diagnosis not present

## 2017-11-01 DIAGNOSIS — H35352 Cystoid macular degeneration, left eye: Secondary | ICD-10-CM | POA: Diagnosis not present

## 2017-11-01 DIAGNOSIS — H3322 Serous retinal detachment, left eye: Secondary | ICD-10-CM | POA: Diagnosis not present

## 2017-11-17 DIAGNOSIS — J4520 Mild intermittent asthma, uncomplicated: Secondary | ICD-10-CM | POA: Diagnosis not present

## 2017-11-17 DIAGNOSIS — J0101 Acute recurrent maxillary sinusitis: Secondary | ICD-10-CM | POA: Diagnosis not present

## 2017-11-17 DIAGNOSIS — J3089 Other allergic rhinitis: Secondary | ICD-10-CM | POA: Diagnosis not present

## 2017-11-17 DIAGNOSIS — J301 Allergic rhinitis due to pollen: Secondary | ICD-10-CM | POA: Diagnosis not present

## 2017-11-23 DIAGNOSIS — H25811 Combined forms of age-related cataract, right eye: Secondary | ICD-10-CM | POA: Diagnosis not present

## 2017-11-23 DIAGNOSIS — H25812 Combined forms of age-related cataract, left eye: Secondary | ICD-10-CM | POA: Diagnosis not present

## 2017-11-23 DIAGNOSIS — H16223 Keratoconjunctivitis sicca, not specified as Sjogren's, bilateral: Secondary | ICD-10-CM | POA: Diagnosis not present

## 2017-11-23 DIAGNOSIS — H353132 Nonexudative age-related macular degeneration, bilateral, intermediate dry stage: Secondary | ICD-10-CM | POA: Diagnosis not present

## 2017-11-29 ENCOUNTER — Ambulatory Visit: Payer: Medicare Other

## 2017-11-29 NOTE — Progress Notes (Deleted)
Patient ID: Stacy Page                 DOB: 04-23-1933                      MRN: [id number]     HPI: Stacy Page is a 84 y.o. female patient of Dr. Tamala Julian who presents today for hypertension evaluation. PMH significant for essential hypertension, and multiple medication intolerances. She was previously seen in HTN clinic in 2017. At that time her regimen included carvedilol, furosemide, hydralazine, losartan, and eplerenone. She was seen in the ED for dehydration and several of these medications were discontinued. She has experienced AKI, altered mental status, and profound hypokalemia on BP medications in the past. At her most recent OV with Dr. Tamala Julian she was found to only be on hydralazine 100mg  TID and pressures were markedly elevated. She was restarted on losartan at that time.   She presents today for    Current HTN meds:  Hydralazine 100mg  TID Losartan 100mg  daily   Previously tried: amlodipine - lower extremity swelling, spironolactone - does not remember reaction, nebivolol and diltiazem - bradycardia, olmesartan - too expensive  BP goal: <140/90 - given age  Family History: The patient's family history includes Alzheimer's disease in her brother and sister; Breast cancer in her sister; Heart disease in her father; Hodgkin's lymphoma in her sister; Hypertension in her brother, father, mother, sister, sister, and sister; Hypertension in her mother, father, and grandmother; Stroke in her mother.  Social History: Patient reports that she quit smoking cigarettes and drinking alcohol almost 40 years ago.  Diet:   Exercise:   Home BP readings:   Wt Readings from Last 3 Encounters:  10/27/17 144 lb (65.3 kg)  06/15/17 143 lb (64.9 kg)  05/17/17 142 lb (64.4 kg)   BP Readings from Last 3 Encounters:  10/27/17 (!) 166/100  06/15/17 140/84  05/17/17 (!) 144/82   Pulse Readings from Last 3 Encounters:  10/27/17 70  06/15/17 85  05/17/17 73    Renal function: CrCl cannot  be calculated (Patient's most recent lab result is older than the maximum 21 days allowed.).  Past Medical History:  Diagnosis Date  . Allergy    seasonal  . Asthma   . GERD (gastroesophageal reflux disease)   . Hyperlipidemia   . Hypertension   . Transient cerebral ischemia     Current Outpatient Medications on File Prior to Visit  Medication Sig Dispense Refill  . acetaminophen (TYLENOL) 500 MG tablet Take 500 mg by mouth every 6 (six) hours as needed.    Marland Kitchen albuterol (PROVENTIL HFA;VENTOLIN HFA) 108 (90 Base) MCG/ACT inhaler Inhale 1-2 puffs into the lungs every 4 (four) hours as needed for wheezing or shortness of breath.    Marland Kitchen atorvastatin (LIPITOR) 40 MG tablet TAKE 1/2 TABLET BY MOUTH  EVERY MONDAY, WEDNESDAY,  FRIDAY, AND SUNDAY 26 tablet 1  . azelastine (ASTELIN) 0.1 % nasal spray Place 2 sprays into both nostrils at bedtime as needed. (allergies)    . clopidogrel (PLAVIX) 75 MG tablet TAKE 1 TABLET BY MOUTH  DAILY 90 tablet 1  . co-enzyme Q-10 30 MG capsule Take 30 mg by mouth daily.     . cycloSPORINE (RESTASIS) 0.05 % ophthalmic emulsion Place 1 drop into both eyes 2 (two) times daily.    . feeding supplement, ENSURE ENLIVE, (ENSURE ENLIVE) LIQD Take 237 mLs by mouth 2 (two) times daily between meals. 237 mL 12  .  fluticasone (FLONASE) 50 MCG/ACT nasal spray Place 2 sprays into both nostrils daily as needed for allergies or rhinitis.    Marland Kitchen gabapentin (NEURONTIN) 100 MG capsule Take 100 mg by mouth 3 (three) times daily as needed (leg and lower back pain).    . hydrALAZINE (APRESOLINE) 100 MG tablet Take 1 tablet (100 mg total) by mouth 3 (three) times daily. 270 tablet 1  . levocetirizine (XYZAL) 5 MG tablet Take 1 tablet (5 mg total) by mouth every evening. 30 tablet 3  . LORazepam (ATIVAN) 0.5 MG tablet Take 1 tablet (0.5 mg total) by mouth daily as needed for anxiety. 30 tablet 0  . losartan (COZAAR) 100 MG tablet Take 1 tablet (100 mg total) by mouth daily. 90 tablet 3  .  meclizine (ANTIVERT) 25 MG tablet TAKE 1/2 TO 1 TABLET BY  MOUTH 3 TIMES DAILY AS  NEEDED FOR DIZZINESS OR  NAUSEA 180 tablet 0  . montelukast (SINGULAIR) 10 MG tablet Take 1 tablet (10 mg total) by mouth at bedtime. 90 tablet 1  . pantoprazole (PROTONIX) 20 MG tablet Take 1 tablet (20 mg total) by mouth daily. 90 tablet 1  . triamcinolone cream (KENALOG) 0.1 % Apply 1 application topically 2 (two) times daily. 30 g 0   No current facility-administered medications on file prior to visit.     Allergies  Allergen Reactions  . Bystolic [Nebivolol Hcl]     Held by Hospitalist @ 05/22/14 admission due to bradycardia (P 44)  . Aspirin Other (See Comments)    gastritis  . Cardizem [Diltiazem Hcl] Other (See Comments)    bradycardia  . Amlodipine Swelling    LE swelling  . Metoclopramide Hcl Other (See Comments)    UNKNOWN  . Spironolactone Other (See Comments)    unknown    There were no vitals taken for this visit.   Assessment/Plan: Hypertension: BMET today.    Thank you, Lelan Pons. Patterson Hammersmith, [facility]  11/29/2017 7:57 AM

## 2017-12-06 DIAGNOSIS — H353121 Nonexudative age-related macular degeneration, left eye, early dry stage: Secondary | ICD-10-CM | POA: Diagnosis not present

## 2017-12-06 DIAGNOSIS — H35352 Cystoid macular degeneration, left eye: Secondary | ICD-10-CM | POA: Diagnosis not present

## 2017-12-06 DIAGNOSIS — H353221 Exudative age-related macular degeneration, left eye, with active choroidal neovascularization: Secondary | ICD-10-CM | POA: Diagnosis not present

## 2017-12-06 DIAGNOSIS — H3322 Serous retinal detachment, left eye: Secondary | ICD-10-CM | POA: Diagnosis not present

## 2017-12-12 DIAGNOSIS — H2512 Age-related nuclear cataract, left eye: Secondary | ICD-10-CM | POA: Diagnosis not present

## 2017-12-12 DIAGNOSIS — H25812 Combined forms of age-related cataract, left eye: Secondary | ICD-10-CM | POA: Diagnosis not present

## 2018-01-10 ENCOUNTER — Other Ambulatory Visit: Payer: Self-pay | Admitting: Internal Medicine

## 2018-01-10 DIAGNOSIS — H353221 Exudative age-related macular degeneration, left eye, with active choroidal neovascularization: Secondary | ICD-10-CM | POA: Diagnosis not present

## 2018-01-10 DIAGNOSIS — H353121 Nonexudative age-related macular degeneration, left eye, early dry stage: Secondary | ICD-10-CM | POA: Diagnosis not present

## 2018-01-10 DIAGNOSIS — H3570 Unspecified separation of retinal layers: Secondary | ICD-10-CM | POA: Diagnosis not present

## 2018-01-10 DIAGNOSIS — H35352 Cystoid macular degeneration, left eye: Secondary | ICD-10-CM | POA: Diagnosis not present

## 2018-01-18 ENCOUNTER — Other Ambulatory Visit: Payer: Self-pay

## 2018-01-18 NOTE — Telephone Encounter (Signed)
LOV: 06/15/2017 for annual visit

## 2018-01-19 MED ORDER — GABAPENTIN 100 MG PO CAPS: 100.0000 mg | ORAL_CAPSULE | Freq: Three times a day (TID) | ORAL | 3 refills | Status: DC | PRN

## 2018-02-01 DIAGNOSIS — H353222 Exudative age-related macular degeneration, left eye, with inactive choroidal neovascularization: Secondary | ICD-10-CM | POA: Diagnosis not present

## 2018-02-01 DIAGNOSIS — H353121 Nonexudative age-related macular degeneration, left eye, early dry stage: Secondary | ICD-10-CM | POA: Diagnosis not present

## 2018-02-01 DIAGNOSIS — H3570 Unspecified separation of retinal layers: Secondary | ICD-10-CM | POA: Diagnosis not present

## 2018-02-01 DIAGNOSIS — H3322 Serous retinal detachment, left eye: Secondary | ICD-10-CM | POA: Diagnosis not present

## 2018-02-28 ENCOUNTER — Other Ambulatory Visit: Payer: Self-pay | Admitting: Internal Medicine

## 2018-03-01 DIAGNOSIS — H353222 Exudative age-related macular degeneration, left eye, with inactive choroidal neovascularization: Secondary | ICD-10-CM | POA: Diagnosis not present

## 2018-03-01 DIAGNOSIS — H3322 Serous retinal detachment, left eye: Secondary | ICD-10-CM | POA: Diagnosis not present

## 2018-03-01 DIAGNOSIS — H353121 Nonexudative age-related macular degeneration, left eye, early dry stage: Secondary | ICD-10-CM | POA: Diagnosis not present

## 2018-03-01 DIAGNOSIS — H3570 Unspecified separation of retinal layers: Secondary | ICD-10-CM | POA: Diagnosis not present

## 2018-03-14 ENCOUNTER — Other Ambulatory Visit: Payer: Self-pay

## 2018-03-14 MED ORDER — MECLIZINE HCL 25 MG PO TABS: ORAL_TABLET | ORAL | 0 refills | Status: DC

## 2018-03-21 ENCOUNTER — Other Ambulatory Visit: Payer: Self-pay | Admitting: Internal Medicine

## 2018-04-11 ENCOUNTER — Ambulatory Visit: Payer: Self-pay | Admitting: *Deleted

## 2018-04-11 NOTE — Telephone Encounter (Signed)
Pt and her daughter called stating that her blood pressure was elevated today: 183/104 at 1245 and 200/100ish; these reading were taken by [facility]; taken on her left arm; the pt and her daughter say that she was not able to take her 2nd medication that she normally takes at 1200 (hydralazine 100 mg); they also state that she took losartan around 0900; they were unable to recheck the pt's blood pressure at home because the cuff was broken; recommendations made per nurse triage protocol to include seeing a physician within 24 hours; the pt was previously scheduled by [facility] agent, Helene Kelp, with Dr Pricilla Holm, [facility], on 04/12/18 at [facility]; they verbalize understanding; will route to office for notification of this upcoming appointment.     Reason for Disposition . Systolic BP  >= 989 OR Diastolic >= 211  Answer Assessment - Initial Assessment Questions 1. BLOOD PRESSURE: "What is the blood pressure?" "Did you take at least two measurements 5 minutes apart?"     183/104 and 200/100ish 2. ONSET: "When did you take your blood pressure?"     04/11/18 at 1245 and 1250 3. HOW: "How did you obtain the blood pressure?" (e.g., visiting nurse, automatic home BP monitor)     Digital cuff at [facility] 4. HISTORY: "Do you have a history of high blood pressure?"     yes 5. MEDICATIONS: "Are you taking any medications for blood pressure?" "Have you missed any doses recently?"     Had not taken 1200 medication 6. OTHER SYMPTOMS: "Do you have any symptoms?" (e.g., headache, chest pain, blurred vision, difficulty breathing, weakness)     no 7. PREGNANCY: "Is there any chance you are pregnant?" "When was your last menstrual period?"     no  Protocols used: HIGH BLOOD PRESSURE-A-AH

## 2018-04-11 NOTE — Telephone Encounter (Signed)
Noted  

## 2018-04-12 ENCOUNTER — Ambulatory Visit (INDEPENDENT_AMBULATORY_CARE_PROVIDER_SITE_OTHER): Payer: Medicare Other | Admitting: Internal Medicine

## 2018-04-12 ENCOUNTER — Other Ambulatory Visit (INDEPENDENT_AMBULATORY_CARE_PROVIDER_SITE_OTHER): Payer: Medicare Other

## 2018-04-12 ENCOUNTER — Encounter: Payer: Self-pay | Admitting: Internal Medicine

## 2018-04-12 VITALS — BP 158/90 | HR 85 | Temp 98.3°F | Ht <= 58 in | Wt 142.0 lb

## 2018-04-12 DIAGNOSIS — I10 Essential (primary) hypertension: Secondary | ICD-10-CM | POA: Diagnosis not present

## 2018-04-12 LAB — CBC
HCT: 39.2 % (ref 36.0–46.0)
Hemoglobin: 12.8 g/dL (ref 12.0–15.0)
MCHC: 32.7 g/dL (ref 30.0–36.0)
MCV: 86.1 fl (ref 78.0–100.0)
PLATELETS: 239 10*3/uL (ref 150.0–400.0)
RBC: 4.55 Mil/uL (ref 3.87–5.11)
RDW: 13.8 % (ref 11.5–15.5)
WBC: 5.1 10*3/uL (ref 4.0–10.5)

## 2018-04-12 LAB — COMPREHENSIVE METABOLIC PANEL
ALT: 9 U/L (ref 0–35)
AST: 14 U/L (ref 0–37)
Albumin: 4.1 g/dL (ref 3.5–5.2)
Alkaline Phosphatase: 95 U/L (ref 39–117)
BILIRUBIN TOTAL: 0.6 mg/dL (ref 0.2–1.2)
BUN: 13 mg/dL (ref 6–23)
CO2: 26 meq/L (ref 19–32)
CREATININE: 0.95 mg/dL (ref 0.40–1.20)
Calcium: 9.8 mg/dL (ref 8.4–10.5)
Chloride: 106 mEq/L (ref 96–112)
GFR: 72.23 mL/min (ref >60.00)
GLUCOSE: 84 mg/dL (ref 70–99)
Potassium: 3.6 mEq/L (ref 3.5–5.1)
Sodium: 142 mEq/L (ref 135–145)
Total Protein: 7.4 g/dL (ref 6.0–8.3)

## 2018-04-12 NOTE — Patient Instructions (Signed)
We would like you to check the blood pressure 1-2 times per week to see how it is doing.   Call us if it is running >150 for the top number or >90 for the bottom number.

## 2018-04-12 NOTE — Progress Notes (Signed)
   Subjective:    Patient ID: Stacy Page, female    DOB: [birth year], 84 y.o.   MRN: [id number]  HPI The patient is an 84 YO female coming in for high blood pressure. She has a life screening at home yesterday and they found her BP to be 180/100 and 200/100 while they were there. She was feeling well. Denies change in diet, exercise. Denies missing medications. Denies headaches, migraines, chest pains, chest pressure, SOB, abdominal pain. Denies nausea or vomiting or change in appetite.   Review of Systems  Constitutional: Negative.   HENT: Negative.   Eyes: Negative.   Respiratory: Negative for cough, chest tightness and shortness of breath.   Cardiovascular: Negative for chest pain, palpitations and leg swelling.  Gastrointestinal: Negative for abdominal distention, abdominal pain, constipation, diarrhea, nausea and vomiting.  Musculoskeletal: Negative.   Skin: Negative.   Neurological: Negative.   Psychiatric/Behavioral: Negative.       Objective:   Physical Exam  Constitutional: She is oriented to person, place, and time. She appears well-developed and well-nourished.  HENT:  Head: Normocephalic and atraumatic.  Eyes: EOM are normal.  Neck: Normal range of motion.  Cardiovascular: Normal rate and regular rhythm.  Pulmonary/Chest: Effort normal and breath sounds normal. No respiratory distress. She has no wheezes. She has no rales.  Abdominal: Soft. Bowel sounds are normal. She exhibits no distension. There is no tenderness. There is no rebound.  Musculoskeletal: She exhibits no edema.  Neurological: She is alert and oriented to person, place, and time. Coordination normal.  Skin: Skin is warm and dry.  Psychiatric: She has a normal mood and affect.   Vitals:   04/12/18 0945 04/12/18 1012  BP: (!) 170/100 (!) 158/90  Pulse: 85   Temp: 98.3 F (36.8 C)   TempSrc: Oral   SpO2: 99%   Weight: 142 lb (64.4 kg)   Height: 4\' 10"  (1.473 m)       Assessment & Plan:

## 2018-04-13 NOTE — Assessment & Plan Note (Addendum)
BP is up slightly today with prior normal. Suspect this is fluctuant. No changes to meds today and no symptoms of urgency. Continue hydralazine and losartan. She will continue close monitoring and call with high readings at home.

## 2018-04-14 ENCOUNTER — Ambulatory Visit: Payer: Self-pay | Admitting: *Deleted

## 2018-04-14 NOTE — Telephone Encounter (Signed)
April, CDA from unspecified dental office calling to report that pt is currently in the office to have a root canal done and has a BP of 200/106 with complaints of feeling lightheaded. April states that the pt's BP was taken a second time and SBP was 170. Spoke with pt's dentist, Jerrell Belfast who states that filling could be done but wanted to make PCP aware because pt voiced concern of being seen by PCP on Monday, and did not have any changes in BP medications. Pt was placed on the phone and pt notified that if she was currently feeling lightheaded she would need to seek care at an Urgent Care/ED. Pt verbalized understanding and states her daughter who is currently with her will take her for treatment. Attempted to call pt and pt's daughter to get the name of the dental office where the pt was receiving treatment but no answer at this time.

## 2018-04-14 NOTE — Telephone Encounter (Signed)
fyi

## 2018-04-14 NOTE — Telephone Encounter (Signed)
  Reason for Disposition . [5] Systolic BP  >= 797 OR Diastolic >= 282 AND [0] cardiac or neurologic symptoms (e.g., chest pain, difficulty breathing, unsteady gait, blurred vision)  Protocols used: HIGH BLOOD PRESSURE-A-AH

## 2018-05-03 ENCOUNTER — Other Ambulatory Visit: Payer: Self-pay

## 2018-05-03 MED ORDER — LOSARTAN POTASSIUM 100 MG PO TABS: 100.0000 mg | ORAL_TABLET | Freq: Every day | ORAL | 3 refills | Status: DC

## 2018-05-10 ENCOUNTER — Telehealth: Payer: Self-pay | Admitting: Internal Medicine

## 2018-05-10 NOTE — Telephone Encounter (Signed)
Copied from [place] [id number]. Topic: Quick Communication - Rx Refill/Question >> May 10, 2018 10:32 AM Scherrie Gerlach wrote: Medication: losartan (COZAAR) 100 MG tablet [facility] calling to clarify if this prescription is for generic or name brand?  Phone no: [phone] Ref no [id number]

## 2018-05-10 NOTE — Telephone Encounter (Signed)
Medication: losartan (COZAAR) 100 MG tablet [facility] calling to clarify if this prescription is for generic or name brand?  Phone no: [phone] Ref no [id number]

## 2018-05-10 NOTE — Telephone Encounter (Signed)
Faxed answer to pharmacy

## 2018-05-22 NOTE — Progress Notes (Signed)
Subjective:   Stacy Page is a 84 y.o. female who presents for Medicare Annual (Subsequent) preventive examination.  Review of Systems:  No ROS.  Medicare Wellness Visit. Additional risk factors are reflected in the social history.  Cardiac Risk Factors include: dyslipidemia;hypertension;sedentary lifestyle Sleep patterns: gets up 1-2 times nightly to void and sleeps 7 hours nightly.    Home Safety/Smoke Alarms: Feels safe in home. Smoke alarms in place.  Living environment; residence and Firearm Safety: 1-story house/ trailer, no firearms. Lives with daughter, no needs for DME, good support system Seat Belt Safety/Bike Helmet: Wears seat belt.     Objective:     Vitals: BP (!) 162/98   Pulse 80   Resp 17   Ht 4\' 10"  (1.473 m)   Wt 143 lb (64.9 kg)   SpO2 99%   BMI 29.89 kg/m   Body mass index is 29.89 kg/m.  Advanced Directives 05/23/2018 05/17/2017 08/26/2016 04/07/2016 01/30/2016 05/29/2014 05/22/2014  Does Patient Have a Medical Advance Directive? No Yes Yes No No No No  Type of Advance Directive - [place];Living will Living will - - - -  Copy of [employer] in Chart? - No - copy requested - - - - -  Would patient like information on creating a medical advance directive? Yes (ED - Information included in AVS) - - Yes - Educational materials given - - No - patient declined information  Pre-existing out of facility DNR order (yellow form or pink MOST form) - - - - - - -    Tobacco Social History   Tobacco Use  Smoking Status Former Smoker  . Types: Cigarettes  . Last attempt to quit: 08/24/1979  . Years since quitting: 38.7  Smokeless Tobacco Never Used  Tobacco Comment   smoked  1966-1981, up to 1 ppd     Counseling given: Not Answered Comment: smoked  1966-1981, up to 1 ppd  Past Medical History:  Diagnosis Date  . Allergy    seasonal  . Asthma   . GERD (gastroesophageal reflux disease)   . Hyperlipidemia   . Hypertension    . Transient cerebral ischemia    Past Surgical History:  Procedure Laterality Date  . ABDOMINAL HYSTERECTOMY     for fibroids (no BSO)  . BUNIONECTOMY Right   . CARPAL TUNNEL RELEASE     Bilaterally  . COLONOSCOPY  2011   tiny polyp ; [facility]  . Macular Degeneration     Dr Zadie Rhine  ; injections  . UMBILICAL HERNIA REPAIR     Family History  Problem Relation Age of Onset  . Alzheimer's disease Mother   . Hypertension Mother   . Stroke Mother 68  . Hypertension Father   . Heart attack Father        in 53's  . Hypertension Maternal Grandmother   . Heart attack Maternal Grandmother 73  . Diabetes Unknown        1/2 sister  . Colon cancer Neg Hx   . Esophageal cancer Neg Hx   . Pancreatic cancer Neg Hx   . Kidney disease Neg Hx   . Liver disease Neg Hx    Social History   Socioeconomic History  . Marital status: Divorced    Spouse name: Not on file  . Number of children: 2  . Years of education: Not on file  . Highest education level: Not on file  Occupational History  . Not on file  Social  Needs  . Financial resource strain: Not hard at all  . Food insecurity:    Worry: Never true    Inability: Never true  . Transportation needs:    Medical: No    Non-medical: No  Tobacco Use  . Smoking status: Former Smoker    Types: Cigarettes    Last attempt to quit: 08/24/1979    Years since quitting: 38.7  . Smokeless tobacco: Never Used  . Tobacco comment: smoked  1966-1981, up to 1 ppd  Substance and Sexual Activity  . Alcohol use: No    Alcohol/week: 0.0 standard drinks  . Drug use: No  . Sexual activity: Never  Lifestyle  . Physical activity:    Days per week: 0 days    Minutes per session: 0 min  . Stress: Not at all  Relationships  . Social connections:    Talks on phone: More than three times a week    Gets together: More than three times a week    Attends religious service: More than 4 times per year    Active member of club or organization: Yes     Attends meetings of clubs or organizations: More than 4 times per year    Relationship status: Divorced  Other Topics Concern  . Not on file  Social History Narrative  . Not on file    Outpatient Encounter Medications as of 05/23/2018  Medication Sig  . acetaminophen (TYLENOL) 500 MG tablet Take 500 mg by mouth every 6 (six) hours as needed.  Marland Kitchen albuterol (PROVENTIL HFA;VENTOLIN HFA) 108 (90 Base) MCG/ACT inhaler Inhale 1-2 puffs into the lungs every 4 (four) hours as needed for wheezing or shortness of breath.  Marland Kitchen atorvastatin (LIPITOR) 40 MG tablet TAKE 1/2 TABLET BY MOUTH  EVERY MONDAY, WEDNESDAY,  FRIDAY, AND SUNDAY  . azelastine (ASTELIN) 0.1 % nasal spray Place 2 sprays into both nostrils at bedtime as needed. (allergies)  . clopidogrel (PLAVIX) 75 MG tablet TAKE 1 TABLET BY MOUTH  DAILY  . co-enzyme Q-10 30 MG capsule Take 30 mg by mouth daily.   . cycloSPORINE (RESTASIS) 0.05 % ophthalmic emulsion Place 1 drop into both eyes 2 (two) times daily.  . feeding supplement, ENSURE ENLIVE, (ENSURE ENLIVE) LIQD Take 237 mLs by mouth 2 (two) times daily between meals.  . fluticasone (FLONASE) 50 MCG/ACT nasal spray Place 2 sprays into both nostrils daily as needed for allergies or rhinitis.  Marland Kitchen gabapentin (NEURONTIN) 100 MG capsule Take 1 capsule (100 mg total) by mouth 3 (three) times daily as needed (leg and lower back pain).  . hydrALAZINE (APRESOLINE) 100 MG tablet TAKE 1 TABLET BY MOUTH 3  TIMES DAILY  . levocetirizine (XYZAL) 5 MG tablet Take 1 tablet (5 mg total) by mouth every evening.  Marland Kitchen LORazepam (ATIVAN) 0.5 MG tablet Take 1 tablet (0.5 mg total) by mouth daily as needed for anxiety.  Marland Kitchen losartan (COZAAR) 100 MG tablet Take 1 tablet (100 mg total) by mouth daily.  . meclizine (ANTIVERT) 25 MG tablet TAKE 1/2 TO 1 TABLET BY  MOUTH 3 TIMES DAILY AS  NEEDED FOR DIZZINESS OR  NAUSEA  . montelukast (SINGULAIR) 10 MG tablet TAKE 1 TABLET BY MOUTH AT  BEDTIME  . pantoprazole (PROTONIX) 20 MG  tablet TAKE 1 TABLET BY MOUTH  DAILY  . triamcinolone cream (KENALOG) 0.1 % Apply 1 application topically 2 (two) times daily.   No facility-administered encounter medications on file as of 05/23/2018.     Activities of  Daily Living In your present state of health, do you have any difficulty performing the following activities: 05/23/2018  Hearing? N  Vision? N  Difficulty concentrating or making decisions? N  Walking or climbing stairs? N  Dressing or bathing? N  Doing errands, shopping? N  Preparing Food and eating ? N  Using the Toilet? N  In the past six months, have you accidently leaked urine? N  Do you have problems with loss of bowel control? N  Managing your Medications? N  Managing your Finances? N  Housekeeping or managing your Housekeeping? N  Some recent data might be hidden    Patient Care Team: Hoyt Koch, MD as PCP - General (Internal Medicine) Belva Crome, MD as PCP - Cardiology (Cardiology) Trula Slade, DPM as Consulting Physician (Podiatry)    Assessment:   This is a routine wellness examination for [place]. Physical assessment deferred to PCP.   Exercise Activities and Dietary recommendations Current Exercise Habits: The patient does not participate in regular exercise at present(chair exercise print-out provided), Intensity: Moderate, Exercise limited by: orthopedic condition(s)  Diet (meal preparation, eat out, water intake, caffeinated beverages, dairy products, fruits and vegetables): in general, a "healthy" diet  , well balanced   Reviewed heart healthy diet. Encouraged patient to increase daily water and healthy fluid intake.  Discussed supplementing with Ensure, samples and coupons provided.  Goals    . Increase physical  activity     Will start to do chair exercises and ride stationary bike 2-3 times a week. Enjoy life and family.     . patient     Socialize more  [employer]; [phone] [address] [place] [employer];  [phone]  Possible community housing solutions to put up handrails   Ask about the [facility] or the [facility];   [employer]; [phone] Management consultant; Information regarding Long Term Care  For safety consider chair in tub; Or shower transfer bench               . Patient Stated    . Patient Stated     Increase my physical activity by using my bicycle, use my light weights and bands, and do chair exercise.       Fall Risk Fall Risk  05/23/2018 05/17/2017 04/07/2016 01/30/2016  Falls in the past year? No No No No    Depression Screen PHQ 2/9 Scores 05/23/2018 05/17/2017 04/07/2016 01/30/2016  PHQ - 2 Score 0 0 0 0  PHQ- 9 Score - 0 - -     Cognitive Function MMSE - Mini Mental State Exam 05/23/2018 05/17/2017 04/07/2016  Orientation to time 5 5 5   Orientation to Place 5 5 5   Registration 3 3 3   Attention/ Calculation 5 5 1   Recall 1 2 2   Language- name 2 objects 2 2 2   Language- repeat 1 1 1   Language- follow 3 step command 3 3 3   Language- read & follow direction 1 1 1   Write a sentence 1 1 1   Copy design 1 1 1   Total score 28 29 25         Immunization History  Administered Date(s) Administered  . Influenza Split 04/23/2012  . Influenza Whole 06/14/2007, 05/23/2008, 05/26/2009, 05/13/2010  . Influenza, High Dose Seasonal PF 05/12/2016, 05/17/2017, 05/23/2018  . Influenza-Unspecified 04/24/2014, 05/24/2015  . Pneumococcal Conjugate-13 04/24/2014  . Pneumococcal Polysaccharide-23 02/17/2016  . Tdap 11/16/2010  . Zoster 12/17/2012   Screening Tests Health Maintenance  Topic Date  Due  . INFLUENZA VACCINE  03/23/2018  . TETANUS/TDAP  11/15/2020  . DEXA SCAN  Completed  . PNA vac Low Risk Adult  Completed      Plan:     Continue doing brain stimulating activities (puzzles, reading, adult coloring books, staying active) to keep memory sharp.   Continue to eat heart healthy diet  (full of fruits, vegetables, whole grains, lean protein, water--limit salt, fat, and sugar intake) and increase physical activity as tolerated.   I have personally reviewed and noted the following in the patient's chart:   . Medical and social history . Use of alcohol, tobacco or illicit drugs  . Current medications and supplements . Functional ability and status . Nutritional status . Physical activity . Advanced directives . List of other physicians . Vitals . Screenings to include cognitive, depression, and falls . Referrals and appointments  In addition, I have reviewed and discussed with patient certain preventive protocols, quality metrics, and best practice recommendations. A written personalized care plan for preventive services as well as general preventive health recommendations were provided to patient.     Michiel Cowboy, RN  05/23/2018

## 2018-05-23 ENCOUNTER — Ambulatory Visit (INDEPENDENT_AMBULATORY_CARE_PROVIDER_SITE_OTHER): Payer: Medicare Other | Admitting: *Deleted

## 2018-05-23 VITALS — BP 162/98 | HR 80 | Resp 17 | Ht <= 58 in | Wt 143.0 lb

## 2018-05-23 DIAGNOSIS — Z0000 Encounter for general adult medical examination without abnormal findings: Secondary | ICD-10-CM

## 2018-05-23 DIAGNOSIS — Z23 Encounter for immunization: Secondary | ICD-10-CM

## 2018-05-23 NOTE — Progress Notes (Signed)
Medical screening examination/treatment/procedure(s) were performed by non-physician practitioner and as supervising physician I was immediately available for consultation/collaboration. I agree with above. Andras Grunewald A Jaydn Fincher, MD 

## 2018-05-23 NOTE — Patient Instructions (Addendum)
Continue doing brain stimulating activities (puzzles, reading, adult coloring books, staying active) to keep memory sharp.   Continue to eat heart healthy diet (full of fruits, vegetables, whole grains, lean protein, water--limit salt, fat, and sugar intake) and increase physical activity as tolerated.   Stacy Page , Thank you for taking time to come for your Medicare Wellness Visit. I appreciate your ongoing commitment to your health goals. Please review the following plan we discussed and let me know if I can assist you in the future.   These are the goals we discussed: Goals    . Increase physical  activity     Will start to do chair exercises and ride stationary bike 2-3 times a week. Enjoy life and family.     . patient     Socialize more  [employer]; [phone] Sr[address] [place] [employer]; [phone]  Possible community housing solutions to put up handrails   Ask about the [facility] or the [facility];   [email or web address]; [phone] Management consultant; Information regarding Long Term Care  For safety consider chair in tub; Or shower transfer bench               . Patient Stated    . Patient Stated     Increase my physical activity by using my bicycle, use my light weights and bands, and do chair exercise.       This is a list of the screening recommended for you and due dates:  Health Maintenance  Topic Date Due  . Flu Shot  03/23/2018  . Tetanus Vaccine  11/15/2020  . DEXA scan (bone density measurement)  Completed  . Pneumonia vaccines  Completed   Influenza Virus Vaccine injection What is this medicine? INFLUENZA VIRUS VACCINE (in floo EN zuh VAHY ruhs vak SEEN) helps to reduce the risk of getting influenza also known as the flu. The vaccine only helps protect you against some strains of the flu. This medicine may be used for other purposes; ask your health care provider or pharmacist if you have  questions. COMMON BRAND NAME(S): Afluria, Agriflu, Alfuria, FLUAD, Fluarix, Fluarix Quadrivalent, Flublok, Flublok Quadrivalent, FLUCELVAX, Flulaval, Fluvirin, Fluzone, Fluzone High-Dose, Fluzone Intradermal What should I tell my health care provider before I take this medicine? They need to know if you have any of these conditions: -bleeding disorder like hemophilia -fever or infection -Guillain-Barre syndrome or other neurological problems -immune system problems -infection with the human immunodeficiency virus (HIV) or AIDS -low blood platelet counts -multiple sclerosis -an unusual or allergic reaction to influenza virus vaccine, latex, other medicines, foods, dyes, or preservatives. Different brands of vaccines contain different allergens. Some may contain latex or eggs. Talk to your doctor about your allergies to make sure that you get the right vaccine. -pregnant or trying to get pregnant -breast-feeding How should I use this medicine? This vaccine is for injection into a muscle or under the skin. It is given by a health care professional. A copy of Vaccine Information Statements will be given before each vaccination. Read this sheet carefully each time. The sheet may change frequently. Talk to your healthcare provider to see which vaccines are right for you. Some vaccines should not be used in all age groups. Overdosage: If you think you have taken too much of this medicine contact a poison control center or emergency room at once. NOTE: This medicine is only for you. Do not share this medicine with others. What if I miss a  dose? This does not apply. What may interact with this medicine? -chemotherapy or radiation therapy -medicines that lower your immune system like etanercept, anakinra, infliximab, and adalimumab -medicines that treat or prevent blood clots like warfarin -phenytoin -steroid medicines like prednisone or cortisone -theophylline -vaccines This list may not  describe all possible interactions. Give your health care provider a list of all the medicines, herbs, non-prescription drugs, or dietary supplements you use. Also tell them if you smoke, drink alcohol, or use illegal drugs. Some items may interact with your medicine. What should I watch for while using this medicine? Report any side effects that do not go away within 3 days to your doctor or health care professional. Call your health care provider if any unusual symptoms occur within 6 weeks of receiving this vaccine. You may still catch the flu, but the illness is not usually as bad. You cannot get the flu from the vaccine. The vaccine will not protect against colds or other illnesses that may cause fever. The vaccine is needed every year. What side effects may I notice from receiving this medicine? Side effects that you should report to your doctor or health care professional as soon as possible: -allergic reactions like skin rash, itching or hives, swelling of the face, lips, or tongue Side effects that usually do not require medical attention (report to your doctor or health care professional if they continue or are bothersome): -fever -headache -muscle aches and pains -pain, tenderness, redness, or swelling at the injection site -tiredness This list may not describe all possible side effects. Call your doctor for medical advice about side effects. You may report side effects to FDA at 1-800-FDA-1088. Where should I keep my medicine? The vaccine will be given by a health care professional in a clinic, pharmacy, doctor's office, or other health care setting. You will not be given vaccine doses to store at home. NOTE: This sheet is a summary. It may not cover all possible information. If you have questions about this medicine, talk to your doctor, pharmacist, or health care provider.  2018 Elsevier/Gold Standard (2015-02-28 10:07:28)  Health Maintenance, Female Adopting a healthy lifestyle  and getting preventive care can go a long way to promote health and wellness. Talk with your health care provider about what schedule of regular examinations is right for you. This is a good chance for you to check in with your provider about disease prevention and staying healthy. In between checkups, there are plenty of things you can do on your own. Experts have done a lot of research about which lifestyle changes and preventive measures are most likely to keep you healthy. Ask your health care provider for more information. Weight and diet Eat a healthy diet  Be sure to include plenty of vegetables, fruits, low-fat dairy products, and lean protein.  Do not eat a lot of foods high in solid fats, added sugars, or salt.  Get regular exercise. This is one of the most important things you can do for your health. ? Most adults should exercise for at least 150 minutes each week. The exercise should increase your heart rate and make you sweat (moderate-intensity exercise). ? Most adults should also do strengthening exercises at least twice a week. This is in addition to the moderate-intensity exercise.  Maintain a healthy weight  Body mass index (BMI) is a measurement that can be used to identify possible weight problems. It estimates body fat based on height and weight. Your health care provider can help  determine your BMI and help you achieve or maintain a healthy weight.  For females 16 years of age and older: ? A BMI below 18.5 is considered underweight. ? A BMI of 18.5 to 24.9 is normal. ? A BMI of 25 to 29.9 is considered overweight. ? A BMI of 30 and above is considered obese.  Watch levels of cholesterol and blood lipids  You should start having your blood tested for lipids and cholesterol at 82 years of age, then have this test every 5 years.  You may need to have your cholesterol levels checked more often if: ? Your lipid or cholesterol levels are high. ? You are older than 82  years of age. ? You are at high risk for heart disease.  Cancer screening Lung Cancer  Lung cancer screening is recommended for adults 68-65 years old who are at high risk for lung cancer because of a history of smoking.  A yearly low-dose CT scan of the lungs is recommended for people who: ? Currently smoke. ? Have quit within the past 15 years. ? Have at least a 30-pack-year history of smoking. A pack year is smoking an average of one pack of cigarettes a day for 1 year.  Yearly screening should continue until it has been 15 years since you quit.  Yearly screening should stop if you develop a health problem that would prevent you from having lung cancer treatment.  Breast Cancer  Practice breast self-awareness. This means understanding how your breasts normally appear and feel.  It also means doing regular breast self-exams. Let your health care provider know about any changes, no matter how small.  If you are in your 20s or 30s, you should have a clinical breast exam (CBE) by a health care provider every 1-3 years as part of a regular health exam.  If you are 49 or older, have a CBE every year. Also consider having a breast X-ray (mammogram) every year.  If you have a family history of breast cancer, talk to your health care provider about genetic screening.  If you are at high risk for breast cancer, talk to your health care provider about having an MRI and a mammogram every year.  Breast cancer gene (BRCA) assessment is recommended for women who have family members with BRCA-related cancers. BRCA-related cancers include: ? Breast. ? Ovarian. ? Tubal. ? Peritoneal cancers.  Results of the assessment will determine the need for genetic counseling and BRCA1 and BRCA2 testing.  Cervical Cancer Your health care provider may recommend that you be screened regularly for cancer of the pelvic organs (ovaries, uterus, and vagina). This screening involves a pelvic examination,  including checking for microscopic changes to the surface of your cervix (Pap test). You may be encouraged to have this screening done every 3 years, beginning at age 70.  For women ages 32-65, health care providers may recommend pelvic exams and Pap testing every 3 years, or they may recommend the Pap and pelvic exam, combined with testing for human papilloma virus (HPV), every 5 years. Some types of HPV increase your risk of cervical cancer. Testing for HPV may also be done on women of any age with unclear Pap test results.  Other health care providers may not recommend any screening for nonpregnant women who are considered low risk for pelvic cancer and who do not have symptoms. Ask your health care provider if a screening pelvic exam is right for you.  If you have had past treatment for  cervical cancer or a condition that could lead to cancer, you need Pap tests and screening for cancer for at least 20 years after your treatment. If Pap tests have been discontinued, your risk factors (such as having a new sexual partner) need to be reassessed to determine if screening should resume. Some women have medical problems that increase the chance of getting cervical cancer. In these cases, your health care provider may recommend more frequent screening and Pap tests.  Colorectal Cancer  This type of cancer can be detected and often prevented.  Routine colorectal cancer screening usually begins at 82 years of age and continues through 82 years of age.  Your health care provider may recommend screening at an earlier age if you have risk factors for colon cancer.  Your health care provider may also recommend using home test kits to check for hidden blood in the stool.  A small camera at the end of a tube can be used to examine your colon directly (sigmoidoscopy or colonoscopy). This is done to check for the earliest forms of colorectal cancer.  Routine screening usually begins at age 23.  Direct  examination of the colon should be repeated every 5-10 years through 82 years of age. However, you may need to be screened more often if early forms of precancerous polyps or small growths are found.  Skin Cancer  Check your skin from head to toe regularly.  Tell your health care provider about any new moles or changes in moles, especially if there is a change in a mole's shape or color.  Also tell your health care provider if you have a mole that is larger than the size of a pencil eraser.  Always use sunscreen. Apply sunscreen liberally and repeatedly throughout the day.  Protect yourself by wearing long sleeves, pants, a wide-brimmed hat, and sunglasses whenever you are outside.  Heart disease, diabetes, and high blood pressure  High blood pressure causes heart disease and increases the risk of stroke. High blood pressure is more likely to develop in: ? People who have blood pressure in the high end of the normal range (130-139/85-89 mm Hg). ? People who are overweight or obese. ? People who are African American.  If you are 39-46 years of age, have your blood pressure checked every 3-5 years. If you are 93 years of age or older, have your blood pressure checked every year. You should have your blood pressure measured twice-once when you are at a hospital or clinic, and once when you are not at a hospital or clinic. Record the average of the two measurements. To check your blood pressure when you are not at a hospital or clinic, you can use: ? An automated blood pressure machine at a pharmacy. ? A home blood pressure monitor.  If you are between 19 years and 86 years old, ask your health care provider if you should take aspirin to prevent strokes.  Have regular diabetes screenings. This involves taking a blood sample to check your fasting blood sugar level. ? If you are at a normal weight and have a low risk for diabetes, have this test once every three years after 82 years of  age. ? If you are overweight and have a high risk for diabetes, consider being tested at a younger age or more often. Preventing infection Hepatitis B  If you have a higher risk for hepatitis B, you should be screened for this virus. You are considered at high risk for hepatitis  B if: ? You were born in a country where hepatitis B is common. Ask your health care provider which countries are considered high risk. ? Your parents were born in a high-risk country, and you have not been immunized against hepatitis B (hepatitis B vaccine). ? You have HIV or AIDS. ? You use needles to inject street drugs. ? You live with someone who has hepatitis B. ? You have had sex with someone who has hepatitis B. ? You get hemodialysis treatment. ? You take certain medicines for conditions, including cancer, organ transplantation, and autoimmune conditions.  Hepatitis C  Blood testing is recommended for: ? Everyone born from [birth year] through 1963. ? Anyone with known risk factors for hepatitis C.  Sexually transmitted infections (STIs)  You should be screened for sexually transmitted infections (STIs) including gonorrhea and chlamydia if: ? You are sexually active and are younger than 82 years of age. ? You are older than 82 years of age and your health care provider tells you that you are at risk for this type of infection. ? Your sexual activity has changed since you were last screened and you are at an increased risk for chlamydia or gonorrhea. Ask your health care provider if you are at risk.  If you do not have HIV, but are at risk, it may be recommended that you take a prescription medicine daily to prevent HIV infection. This is called pre-exposure prophylaxis (PrEP). You are considered at risk if: ? You are sexually active and do not regularly use condoms or know the HIV status of your partner(s). ? You take drugs by injection. ? You are sexually active with a partner who has HIV.  Talk with your  health care provider about whether you are at high risk of being infected with HIV. If you choose to begin PrEP, you should first be tested for HIV. You should then be tested every 3 months for as long as you are taking PrEP. Pregnancy  If you are premenopausal and you may become pregnant, ask your health care provider about preconception counseling.  If you may become pregnant, take 400 to 800 micrograms (mcg) of folic acid every day.  If you want to prevent pregnancy, talk to your health care provider about birth control (contraception). Osteoporosis and menopause  Osteoporosis is a disease in which the bones lose minerals and strength with aging. This can result in serious bone fractures. Your risk for osteoporosis can be identified using a bone density scan.  If you are 46 years of age or older, or if you are at risk for osteoporosis and fractures, ask your health care provider if you should be screened.  Ask your health care provider whether you should take a calcium or vitamin D supplement to lower your risk for osteoporosis.  Menopause may have certain physical symptoms and risks.  Hormone replacement therapy may reduce some of these symptoms and risks. Talk to your health care provider about whether hormone replacement therapy is right for you. Follow these instructions at home:  Schedule regular health, dental, and eye exams.  Stay current with your immunizations.  Do not use any tobacco products including cigarettes, chewing tobacco, or electronic cigarettes.  If you are pregnant, do not drink alcohol.  If you are breastfeeding, limit how much and how often you drink alcohol.  Limit alcohol intake to no more than 1 drink per day for nonpregnant women. One drink equals 12 ounces of beer, 5 ounces of wine, or 1  ounces of hard liquor.  Do not use street drugs.  Do not share needles.  Ask your health care provider for help if you need support or information about quitting  drugs.  Tell your health care provider if you often feel depressed.  Tell your health care provider if you have ever been abused or do not feel safe at home. This information is not intended to replace advice given to you by your health care provider. Make sure you discuss any questions you have with your health care provider. Document Released: 02/22/2011 Document Revised: 01/15/2016 Document Reviewed: 05/13/2015 Elsevier Interactive Patient Education  [employer].

## 2018-06-16 ENCOUNTER — Other Ambulatory Visit: Payer: Self-pay | Admitting: Internal Medicine

## 2018-06-21 DIAGNOSIS — H3570 Unspecified separation of retinal layers: Secondary | ICD-10-CM | POA: Diagnosis not present

## 2018-06-21 DIAGNOSIS — H353121 Nonexudative age-related macular degeneration, left eye, early dry stage: Secondary | ICD-10-CM | POA: Diagnosis not present

## 2018-06-21 DIAGNOSIS — H3322 Serous retinal detachment, left eye: Secondary | ICD-10-CM | POA: Diagnosis not present

## 2018-06-21 DIAGNOSIS — H353222 Exudative age-related macular degeneration, left eye, with inactive choroidal neovascularization: Secondary | ICD-10-CM | POA: Diagnosis not present

## 2018-06-21 DIAGNOSIS — H43812 Vitreous degeneration, left eye: Secondary | ICD-10-CM | POA: Diagnosis not present

## 2018-06-27 ENCOUNTER — Other Ambulatory Visit: Payer: Self-pay | Admitting: Internal Medicine

## 2018-06-28 NOTE — Telephone Encounter (Signed)
Patient states that she is wanting a refill. States that she has not had any in awhile but every now and then she gets dizzy spells and they help.

## 2018-06-28 NOTE — Telephone Encounter (Signed)
Does patient want refill? Should not be long term medication.

## 2018-07-11 ENCOUNTER — Other Ambulatory Visit: Payer: Self-pay | Admitting: Internal Medicine

## 2018-09-11 ENCOUNTER — Other Ambulatory Visit: Payer: Self-pay | Admitting: Internal Medicine

## 2018-09-18 ENCOUNTER — Ambulatory Visit: Payer: Self-pay | Admitting: *Deleted

## 2018-09-18 NOTE — Telephone Encounter (Signed)
Pt reports lightheadedness x 3 weeks. States positional, worse when sitting to standing. States mild, "Swimmy headed."  States her daughter checks BP, has been "Running normal" States has been taking meds as ordered. Also reports tenderness at sinus areas, no other cold/congestion symptoms. No earache. States "Probably don't drink enough." Appt made with LValere Dross for tomorrow. Care advise given per protocol. Reason for Disposition . Taking a medicine that could cause dizziness (e.g., blood pressure medications, diuretics)  Answer Assessment - Initial Assessment Questions 1. DESCRIPTION: "Describe your dizziness."     Lightheaded 2. LIGHTHEADED: "Do you feel lightheaded?" (e.g., somewhat faint, woozy, weak upon standing)     yes 3. VERTIGO: "Do you feel like either you or the room is spinning or tilting?" (i.e. vertigo)     no 4. SEVERITY: "How bad is it?"  "Do you feel like you are going to faint?" "Can you stand and walk?"   - MILD - walking normally   - MODERATE - interferes with normal activities (e.g., work, school)    - SEVERE - unable to stand, requires support to walk, feels like passing out now.      mild 5. ONSET:  "When did the dizziness begin?"     3 weeks ago 6. AGGRAVATING FACTORS: "Does anything make it worse?" (e.g., standing, change in head position)     Positional, worse sitting to standing 7. HEART RATE: "Can you tell me your heart rate?" "How many beats in 15 seconds?"  (Note: not all patients can do this)       no 8. CAUSE: "What do you think is causing the dizziness?"     Unsure 9. RECURRENT SYMPTOM: "Have you had dizziness before?" If so, ask: "When was the last time?" "What happened that time?"     "Off and on throughout the years." 10. OTHER SYMPTOMS: "Do you have any other symptoms?" (e.g., fever, chest pain, vomiting, diarrhea, bleeding)      "A little tender at my nose and cheeks" Sinus tenderness. No other cold, congestion symptoms.  Protocols used: DIZZINESS  [facility]

## 2018-09-19 ENCOUNTER — Ambulatory Visit (INDEPENDENT_AMBULATORY_CARE_PROVIDER_SITE_OTHER): Payer: Medicare Other | Admitting: Family

## 2018-09-19 ENCOUNTER — Encounter: Payer: Self-pay | Admitting: Family

## 2018-09-19 VITALS — BP 158/84 | HR 94 | Temp 98.3°F | Ht <= 58 in | Wt 144.0 lb

## 2018-09-19 DIAGNOSIS — J3089 Other allergic rhinitis: Secondary | ICD-10-CM

## 2018-09-19 DIAGNOSIS — R413 Other amnesia: Secondary | ICD-10-CM

## 2018-09-19 DIAGNOSIS — I10 Essential (primary) hypertension: Secondary | ICD-10-CM | POA: Diagnosis not present

## 2018-09-19 DIAGNOSIS — R42 Dizziness and giddiness: Secondary | ICD-10-CM | POA: Diagnosis not present

## 2018-09-19 MED ORDER — LEVOCETIRIZINE DIHYDROCHLORIDE 5 MG PO TABS: 5.0000 mg | ORAL_TABLET | Freq: Every evening | ORAL | 3 refills | Status: DC

## 2018-09-19 MED ORDER — HYDROCHLOROTHIAZIDE 12.5 MG PO TABS: 12.5000 mg | ORAL_TABLET | Freq: Every day | ORAL | 2 refills | Status: DC

## 2018-09-19 NOTE — Progress Notes (Signed)
Stacy Page is a 85 y.o. female with the following history as recorded in Epic[employer]:  Patient Active Problem List   Diagnosis Date Noted  . Loss of weight 02/04/2016  . Hypokalemia 01/30/2016  . Normocytic anemia 01/30/2016  . Routine general medical examination at a health care facility 12/18/2014  . TIA (transient ischemic attack) 05/23/2014  . Accelerated hypertension 03/12/2014  . GERD 08/29/2008  . HYPERLIPIDEMIA 06/14/2007  . Asthma with allergic rhinitis without complication 38/05/1750  . Allergic rhinitis 11/07/2006  . SPINAL STENOSIS 11/07/2006    Current Outpatient Medications  Medication Sig Dispense Refill  . acetaminophen (TYLENOL) 500 MG tablet Take 500 mg by mouth every 6 (six) hours as needed.    Marland Kitchen albuterol (PROVENTIL HFA;VENTOLIN HFA) 108 (90 Base) MCG/ACT inhaler Inhale 1-2 puffs into the lungs every 4 (four) hours as needed for wheezing or shortness of breath.    Marland Kitchen atorvastatin (LIPITOR) 40 MG tablet TAKE 1/2 TABLET BY MOUTH  EVERY MONDAY, WEDNESDAY,  FRIDAY, AND SUNDAY. 26 tablet 1  . azelastine (ASTELIN) 0.1 % nasal spray Place 2 sprays into both nostrils at bedtime as needed. (allergies)    . clopidogrel (PLAVIX) 75 MG tablet TAKE 1 TABLET BY MOUTH  DAILY 90 tablet 1  . co-enzyme Q-10 30 MG capsule Take 30 mg by mouth daily.     . cycloSPORINE (RESTASIS) 0.05 % ophthalmic emulsion Place 1 drop into both eyes 2 (two) times daily.    . feeding supplement, ENSURE ENLIVE, (ENSURE ENLIVE) LIQD Take 237 mLs by mouth 2 (two) times daily between meals. 237 mL 12  . fluticasone (FLONASE) 50 MCG/ACT nasal spray Place 2 sprays into both nostrils daily as needed for allergies or rhinitis.    Marland Kitchen gabapentin (NEURONTIN) 100 MG capsule Take 1 capsule (100 mg total) by mouth 3 (three) times daily as needed (leg and lower back pain). 90 capsule 3  . hydrALAZINE (APRESOLINE) 100 MG tablet TAKE 1 TABLET BY MOUTH 3  TIMES DAILY 270 tablet 1  . levocetirizine (XYZAL) 5 MG tablet Take  1 tablet (5 mg total) by mouth every evening. 30 tablet 3  . LORazepam (ATIVAN) 0.5 MG tablet Take 1 tablet (0.5 mg total) by mouth daily as needed for anxiety. 30 tablet 0  . losartan (COZAAR) 100 MG tablet Take 1 tablet (100 mg total) by mouth daily. 90 tablet 3  . meclizine (ANTIVERT) 25 MG tablet TAKE 1/2 TO 1 TABLET BY  MOUTH 3 TIMES DAILY AS  NEEDED FOR DIZZINESS OR  NAUSEA 15 tablet 0  . montelukast (SINGULAIR) 10 MG tablet TAKE 1 TABLET BY MOUTH AT  BEDTIME 90 tablet 1  . pantoprazole (PROTONIX) 20 MG tablet TAKE 1 TABLET BY MOUTH  DAILY 90 tablet 1  . triamcinolone cream (KENALOG) 0.1 % Apply 1 application topically 2 (two) times daily. 30 g 0  . hydrochlorothiazide (HYDRODIURIL) 12.5 MG tablet Take 1 tablet (12.5 mg total) by mouth daily. 30 tablet 2   No current facility-administered medications for this visit.     Allergies: Bystolic [nebivolol hcl]; Aspirin; Cardizem [diltiazem hcl]; Amlodipine; Metoclopramide hcl; and Spironolactone  Past Medical History:  Diagnosis Date  . Allergy    seasonal  . Asthma   . GERD (gastroesophageal reflux disease)   . Hyperlipidemia   . Hypertension   . Transient cerebral ischemia     Past Surgical History:  Procedure Laterality Date  . ABDOMINAL HYSTERECTOMY     for fibroids (no BSO)  . BUNIONECTOMY Right   .  CARPAL TUNNEL RELEASE     Bilaterally  . COLONOSCOPY  2011   tiny polyp ; [facility]  . Macular Degeneration     Dr Zadie Rhine  ; injections  . UMBILICAL HERNIA REPAIR      Family History  Problem Relation Age of Onset  . Alzheimer's disease Mother   . Hypertension Mother   . Stroke Mother 88  . Hypertension Father   . Heart attack Father        in 28's  . Hypertension Maternal Grandmother   . Heart attack Maternal Grandmother 73  . Diabetes Unknown        1/2 sister  . Colon cancer Neg Hx   . Esophageal cancer Neg Hx   . Pancreatic cancer Neg Hx   . Kidney disease Neg Hx   . Liver disease Neg Hx     Social History    Tobacco Use  . Smoking status: Former Smoker    Types: Cigarettes    Last attempt to quit: 08/24/1979    Years since quitting: 39.0  . Smokeless tobacco: Never Used  . Tobacco comment: smoked  1966-1981, up to 1 ppd  Substance Use Topics  . Alcohol use: No    Alcohol/week: 0.0 standard drinks    Subjective:  Patient presents with concerns for feeling light-headed x 3 weeks; has been feeling dizzy; Denies any chest pain, shortness of breath, blurred vision or headache. blood pressure is noted to be elevated today- has been elevated at recent OV; per patient and daughter, pressure does average in the 150s; per patient and daughter, she is not checking her blood pressure regularly. She was asked by her PCP to call back with blood pressure readings after last OV in August- did not understand this recommendation. It also looks like cardiology had recommended seeing her in follow-up in the hypertension clinic last year- unfortunately, that was not scheduled.  Feels that "sinsues" are causing the symptoms as well; patient is not taking the Cetirizine on her medication list; is taking Flonase and Singulair; has been having some mild pressure over her sinuses but no thick, yellow, discolored mucus;   Also requesting referral to neurology per family request- they feel that there are some issues with patient's memory.      Objective:  Vitals:   09/19/18 1057  BP: (!) 158/84  Pulse: 94  Temp: 98.3 F (36.8 C)  TempSrc: Oral  SpO2: 97%  Weight: 144 lb (65.3 kg)  Height: 4\' 10"  (1.473 m)    General: Well developed, well nourished, in no acute distress  Skin : Warm and dry.  Head: Normocephalic and atraumatic  Eyes: Sclera and conjunctiva clear; pupils round and reactive to light; extraocular movements intact  Ears: External normal; canals clear; tympanic membranes normal  Oropharynx: Pink, supple. No suspicious lesions  Neck: Supple without thyromegaly, adenopathy  Lungs: Respirations  unlabored; clear to auscultation bilaterally without wheeze, rales, rhonchi  CVS exam: normal rate and regular rhythm.  Neurologic: Alert and oriented; speech intact; face symmetrical; moves all extremities well; CNII-XII intact without focal deficit   Assessment:  1. Dizziness   2. Essential hypertension   3. Non-seasonal allergic rhinitis due to other allergic trigger   4. Memory changes     Plan:  Suspect dizziness combination of uncontrolled blood pressure and uncontrolled allergies; 2. Continue Losartan 100 mg, Hydralazine 100 mg tid and re-start HCTZ 12.5 mg ( per cardiology recommendation) from [place] note in March 2019; labs were checked in  October 2019; start checking pressure regularly; return with PCP in 2-3 weeks/ bring log and cuff for review. 3. Continue Flonase and Singulair; re-start Xyzal; call back if "sinus symptoms" persist- t/c antibiotic. 4. Refer to neurology as requested.    Return in about 2 weeks (around 10/03/2018) for with Dr. Sharlet Salina.  Orders Placed This Encounter  Procedures  . Ambulatory referral to Neurology    Referral Priority:   Routine    Referral Type:   Consultation    Referral Reason:   Specialty Services Required    Requested Specialty:   Neurology    Number of Visits Requested:   1    Requested Prescriptions   Signed Prescriptions Disp Refills  . hydrochlorothiazide (HYDRODIURIL) 12.5 MG tablet 30 tablet 2    Sig: Take 1 tablet (12.5 mg total) by mouth daily.  Marland Kitchen levocetirizine (XYZAL) 5 MG tablet 30 tablet 3    Sig: Take 1 tablet (5 mg total) by mouth every evening.

## 2018-09-21 ENCOUNTER — Encounter: Payer: Self-pay | Admitting: Neurology

## 2018-10-05 ENCOUNTER — Encounter: Payer: Self-pay | Admitting: Internal Medicine

## 2018-10-05 ENCOUNTER — Ambulatory Visit (INDEPENDENT_AMBULATORY_CARE_PROVIDER_SITE_OTHER): Payer: Medicare Other | Admitting: Internal Medicine

## 2018-10-05 ENCOUNTER — Other Ambulatory Visit (INDEPENDENT_AMBULATORY_CARE_PROVIDER_SITE_OTHER): Payer: Medicare Other

## 2018-10-05 VITALS — BP 150/90 | HR 82 | Temp 98.2°F | Ht <= 58 in | Wt 146.0 lb

## 2018-10-05 DIAGNOSIS — I10 Essential (primary) hypertension: Secondary | ICD-10-CM | POA: Diagnosis not present

## 2018-10-05 DIAGNOSIS — J3089 Other allergic rhinitis: Secondary | ICD-10-CM

## 2018-10-05 DIAGNOSIS — R42 Dizziness and giddiness: Secondary | ICD-10-CM

## 2018-10-05 LAB — COMPREHENSIVE METABOLIC PANEL
ALT: 13 U/L (ref 0–35)
AST: 17 U/L (ref 0–37)
Albumin: 4.1 g/dL (ref 3.5–5.2)
Alkaline Phosphatase: 111 U/L (ref 39–117)
BILIRUBIN TOTAL: 0.3 mg/dL (ref 0.2–1.2)
BUN: 16 mg/dL (ref 6–23)
CHLORIDE: 104 meq/L (ref 96–112)
CO2: 29 meq/L (ref 19–32)
Calcium: 9.6 mg/dL (ref 8.4–10.5)
Creatinine, Ser: 0.92 mg/dL (ref 0.40–1.20)
GFR: 70.44 mL/min (ref >60.00)
Glucose, Bld: 106 mg/dL — ABNORMAL HIGH (ref 70–99)
Potassium: 3.4 mEq/L — ABNORMAL LOW (ref 3.5–5.1)
Sodium: 142 mEq/L (ref 135–145)
Total Protein: 7.3 g/dL (ref 6.0–8.3)

## 2018-10-05 LAB — CBC
HCT: 39.6 % (ref 36.0–46.0)
Hemoglobin: 12.9 g/dL (ref 12.0–15.0)
MCHC: 32.5 g/dL (ref 30.0–36.0)
MCV: 86.2 fl (ref 78.0–100.0)
PLATELETS: 236 10*3/uL (ref 150.0–400.0)
RBC: 4.59 Mil/uL (ref 3.87–5.11)
RDW: 14.6 % (ref 11.5–15.5)
WBC: 4.9 10*3/uL (ref 4.0–10.5)

## 2018-10-05 MED ORDER — AMOXICILLIN-POT CLAVULANATE 875-125 MG PO TABS: 1.0000 | ORAL_TABLET | Freq: Two times a day (BID) | ORAL | 0 refills | Status: DC

## 2018-10-05 NOTE — Assessment & Plan Note (Signed)
Still taking xyzal, singulair, flonase but has active sinus infection. Rx for augmentin.

## 2018-10-05 NOTE — Progress Notes (Signed)
   Subjective:   Patient ID: Stacy Page, female    DOB: 11-May-1933, 85 y.o.   MRN: [id number]  HPI The patient is a 85 YO female coming in for several concerns including dizziness (feeling kind of like things are spinning, denies feeling like she would pass out, denies numbness or weakness, denies chest pains or SOB) and blood pressure (started hctz 12.5 mg daily about 1-2 weeks ago and has not noticed much difference, checking BP at home and BP is more often 140-150s before was running in the 160s often) and sinus congestion (started about 3-4 weeks ago, overall worsening, pressure in her face, drainage from nose, denies cough or SOB, denies fevers or chills, some dizziness along with this, taking flonase and xyzal and singulair).   Review of Systems  Constitutional: Negative.  Negative for fatigue, fever and unexpected weight change.  HENT: Positive for congestion, postnasal drip, rhinorrhea, sinus pressure and sinus pain. Negative for ear discharge, ear pain, sneezing, sore throat, tinnitus, trouble swallowing and voice change.   Eyes: Negative.   Respiratory: Negative for cough, chest tightness, shortness of breath and wheezing.   Cardiovascular: Negative.  Negative for chest pain, palpitations and leg swelling.  Gastrointestinal: Negative.  Negative for abdominal distention, abdominal pain, constipation, diarrhea, nausea and vomiting.  Musculoskeletal: Negative.   Skin: Negative.   Neurological: Positive for dizziness. Negative for weakness, light-headedness, numbness and headaches.  Psychiatric/Behavioral: Negative.     Objective:  Physical Exam Constitutional:      Appearance: She is well-developed.  HENT:     Head: Normocephalic and atraumatic.     Comments: Oropharynx with redness and clear drainage, nose with swollen turbinates, TMs normal bilaterally.  Neck:     Musculoskeletal: Normal range of motion.     Thyroid: No thyromegaly.  Cardiovascular:     Rate and Rhythm:  Normal rate and regular rhythm.  Pulmonary:     Effort: Pulmonary effort is normal. No respiratory distress.     Breath sounds: Normal breath sounds. No wheezing or rales.  Abdominal:     General: Bowel sounds are normal. There is no distension.     Palpations: Abdomen is soft.     Tenderness: There is no abdominal tenderness. There is no rebound.  Musculoskeletal:        General: No tenderness.  Lymphadenopathy:     Cervical: No cervical adenopathy.  Skin:    General: Skin is warm and dry.  Neurological:     Mental Status: She is alert and oriented to person, place, and time.     Coordination: Coordination normal.     Vitals:   10/05/18 1046 10/05/18 1105  BP: (!) 158/90 (!) 150/90  Pulse: 82   Temp: 98.2 F (36.8 C)   TempSrc: Oral   SpO2: 97%   Weight: 146 lb (66.2 kg)   Height: 4\' 10"  (1.473 m)     Assessment & Plan:

## 2018-10-05 NOTE — Patient Instructions (Signed)
We have sent in the augmentin for the sinuses. Take 1 pill twice a day for 1 week.   We will leave the other medicines the same so keep watching the blood pressure 2-3 times per week at home.   We are checking some blood work today and will call you back about the results.

## 2018-10-05 NOTE — Assessment & Plan Note (Signed)
BP improving gradually at home. Checking CMP and adjust as needed. Taking hydralazine and hctz and losartan.

## 2018-10-05 NOTE — Assessment & Plan Note (Signed)
Could be related to sinus infection and rx for augmentin and continue her singulair and xyzal and flonase.

## 2018-10-16 ENCOUNTER — Other Ambulatory Visit: Payer: Self-pay | Admitting: Internal Medicine

## 2018-11-14 ENCOUNTER — Other Ambulatory Visit: Payer: Self-pay | Admitting: Internal Medicine

## 2018-11-28 ENCOUNTER — Ambulatory Visit: Payer: Medicare Other | Admitting: Neurology

## 2019-01-04 ENCOUNTER — Other Ambulatory Visit: Payer: Self-pay | Admitting: Internal Medicine

## 2019-01-04 ENCOUNTER — Telehealth: Payer: Self-pay | Admitting: Internal Medicine

## 2019-01-04 NOTE — Telephone Encounter (Signed)
LVM for patient to call back with BP readings.

## 2019-01-04 NOTE — Telephone Encounter (Signed)
Copied from [place]. Topic: Quick Communication - Rx Refill/Question >> Jan 04, 2019  3:23 PM Leward Quan A wrote: Medication: hydrochlorothiazide (HYDRODIURIL) 12.5 MG tablet   Has the patient contacted their pharmacy? Yes.   (Agent: If no, request that the patient contact the pharmacy for the refill.) (Agent: If yes, when and what did the pharmacy advise?)  Preferred Pharmacy (with phone number or street name): [id number] - [place], [place] - [address] AT [place] [phone] (Phone) [phone] (Fax)    Agent: Please be advised that RX refills may take up to 3 business days. We ask that you follow-up with your pharmacy.

## 2019-01-04 NOTE — Telephone Encounter (Signed)
We need information about her BP readings at home. She never called back with readings after Feb apt.

## 2019-01-12 ENCOUNTER — Telehealth: Payer: Self-pay | Admitting: Internal Medicine

## 2019-01-12 MED ORDER — HYDROCHLOROTHIAZIDE 12.5 MG PO TABS: 12.5000 mg | ORAL_TABLET | Freq: Every day | ORAL | 1 refills | Status: DC

## 2019-01-12 NOTE — Telephone Encounter (Signed)
Copied from [place] [id number]. Topic: Quick Communication - Rx Refill/Question >> Jan 12, 2019  1:38 PM Sheran Luz wrote: Medication: hydrochlorothiazide (HYDRODIURIL) 12.5 MG tablet Patient requesting refill of this medication.    Preferred Pharmacy (with phone number or street name):[id number] - [place], [place] - [address] AT [place] [phone] (Phone) [phone] (Fax)

## 2019-01-12 NOTE — Telephone Encounter (Signed)
rx sent

## 2019-01-31 ENCOUNTER — Ambulatory Visit: Payer: Medicare Other | Admitting: Neurology

## 2019-02-01 ENCOUNTER — Ambulatory Visit: Payer: Medicare Other | Admitting: Neurology

## 2019-02-09 ENCOUNTER — Other Ambulatory Visit: Payer: Self-pay | Admitting: Internal Medicine

## 2019-02-14 ENCOUNTER — Other Ambulatory Visit: Payer: Self-pay

## 2019-02-15 ENCOUNTER — Encounter: Payer: Self-pay | Admitting: Neurology

## 2019-02-15 ENCOUNTER — Telehealth (INDEPENDENT_AMBULATORY_CARE_PROVIDER_SITE_OTHER): Payer: Medicare Other | Admitting: Neurology

## 2019-02-15 VITALS — Ht <= 58 in | Wt 140.0 lb

## 2019-02-15 DIAGNOSIS — F0390 Unspecified dementia without behavioral disturbance: Secondary | ICD-10-CM | POA: Diagnosis not present

## 2019-02-15 DIAGNOSIS — F03A0 Unspecified dementia, mild, without behavioral disturbance, psychotic disturbance, mood disturbance, and anxiety: Secondary | ICD-10-CM

## 2019-02-15 MED ORDER — DONEPEZIL HCL 10 MG PO TABS: ORAL_TABLET | ORAL | 11 refills | Status: DC

## 2019-02-15 NOTE — Addendum Note (Signed)
Addended by: Jake Seats on: 02/15/2019 11:36 AM   Modules accepted: Orders
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
## 2019-02-26 ENCOUNTER — Other Ambulatory Visit: Payer: Self-pay

## 2019-02-26 ENCOUNTER — Other Ambulatory Visit (INDEPENDENT_AMBULATORY_CARE_PROVIDER_SITE_OTHER): Payer: Medicare Other

## 2019-02-26 DIAGNOSIS — F03A0 Unspecified dementia, mild, without behavioral disturbance, psychotic disturbance, mood disturbance, and anxiety: Secondary | ICD-10-CM

## 2019-02-26 DIAGNOSIS — F0390 Unspecified dementia without behavioral disturbance: Secondary | ICD-10-CM

## 2019-02-27 LAB — VITAMIN B12: Vitamin B-12: 533 pg/mL (ref 200–1100)

## 2019-02-27 LAB — TSH: TSH: 1.84 mIU/L (ref 0.40–4.50)

## 2019-03-02 ENCOUNTER — Other Ambulatory Visit: Payer: Self-pay | Admitting: Family

## 2019-03-30 ENCOUNTER — Other Ambulatory Visit: Payer: Self-pay | Admitting: Internal Medicine

## 2019-04-26 ENCOUNTER — Other Ambulatory Visit: Payer: Self-pay | Admitting: Internal Medicine

## 2019-04-27 ENCOUNTER — Other Ambulatory Visit: Payer: Self-pay

## 2019-04-27 ENCOUNTER — Ambulatory Visit (INDEPENDENT_AMBULATORY_CARE_PROVIDER_SITE_OTHER): Payer: Medicare Other | Admitting: Internal Medicine

## 2019-04-27 ENCOUNTER — Encounter: Payer: Self-pay | Admitting: Internal Medicine

## 2019-04-27 VITALS — BP 140/100 | HR 86 | Temp 98.4°F | Ht <= 58 in | Wt 147.0 lb

## 2019-04-27 DIAGNOSIS — N644 Mastodynia: Secondary | ICD-10-CM | POA: Insufficient documentation

## 2019-04-27 NOTE — Progress Notes (Signed)
   Subjective:   Patient ID: Stacy Page, female    DOB: May 11, 1933, 85 y.o.   MRN: [id number]  HPI The patient is an 85 YO female coming in for right breast pain. Started about 1 month or so ago. She has been drinking more caffeine lately. Denies nipple discharge. Denies feeling a lump in breast. Denies change in size/shape of breast. Last mammogram 2016 on record which was normal.   Review of Systems  Constitutional: Negative.   HENT: Negative.   Eyes: Negative.   Respiratory: Negative for cough, chest tightness and shortness of breath.   Cardiovascular: Negative for chest pain, palpitations and leg swelling.       Right breast pain  Gastrointestinal: Negative for abdominal distention, abdominal pain, constipation, diarrhea, nausea and vomiting.  Musculoskeletal: Negative.   Skin: Negative.   Neurological: Negative.   Psychiatric/Behavioral: Negative.     Objective:  Physical Exam Constitutional:      Appearance: She is well-developed.  HENT:     Head: Normocephalic and atraumatic.  Neck:     Musculoskeletal: Normal range of motion.  Cardiovascular:     Rate and Rhythm: Normal rate and regular rhythm.     Comments: Breast exam normal, mild tenderness right 12 o'clock without mass, no nipple discharge, no LAD axillary bilateral Pulmonary:     Effort: Pulmonary effort is normal. No respiratory distress.     Breath sounds: Normal breath sounds. No wheezing or rales.  Abdominal:     General: Bowel sounds are normal. There is no distension.     Palpations: Abdomen is soft.     Tenderness: There is no abdominal tenderness. There is no rebound.  Skin:    General: Skin is warm and dry.  Neurological:     Mental Status: She is alert and oriented to person, place, and time.     Coordination: Coordination normal.     Vitals:   04/27/19 1034  BP: (!) 140/100  Pulse: 86  Temp: 98.4 F (36.9 C)  TempSrc: Oral  SpO2: 98%  Weight: 147 lb (66.7 kg)  Height: 4\' 10"  (1.473 m)     Assessment & Plan:

## 2019-04-27 NOTE — Patient Instructions (Signed)
We will get the mammogram ordered at [facility] just to check but I did not feel anything on exam today.

## 2019-04-27 NOTE — Assessment & Plan Note (Signed)
Exam reassuring and could be related to caffeine intake. Ordered diagnostic mammogram to rule out abnormality.

## 2019-05-11 ENCOUNTER — Other Ambulatory Visit: Payer: Self-pay | Admitting: Internal Medicine

## 2019-07-03 ENCOUNTER — Telehealth: Payer: Self-pay

## 2019-07-03 NOTE — Telephone Encounter (Signed)
Copied from [place] [phone]. Topic: General - Other >> Jul 03, 2019  2:14 PM Rainey Pines A wrote: Patient would like a callback from nurse in regards to Dr. Sharlet Salina prescribing a medication for her upcoming tooth extractions due to her getting excited and her blood pressure going up. Please advise.

## 2019-07-03 NOTE — Telephone Encounter (Signed)
Tried calling patient but no answer and mail box full. When patient calls back please ask what medication she is referring to and what questions she is having about said medication.

## 2019-07-09 ENCOUNTER — Other Ambulatory Visit: Payer: Self-pay | Admitting: Internal Medicine

## 2019-07-28 ENCOUNTER — Other Ambulatory Visit: Payer: Self-pay | Admitting: Internal Medicine

## 2019-07-30 ENCOUNTER — Other Ambulatory Visit: Payer: Self-pay

## 2019-07-30 MED ORDER — GABAPENTIN 100 MG PO CAPS: 100.0000 mg | ORAL_CAPSULE | Freq: Three times a day (TID) | ORAL | 1 refills | Status: DC | PRN

## 2019-08-01 ENCOUNTER — Other Ambulatory Visit: Payer: Self-pay | Admitting: Internal Medicine

## 2019-09-14 ENCOUNTER — Telehealth (INDEPENDENT_AMBULATORY_CARE_PROVIDER_SITE_OTHER): Payer: Medicare Other | Admitting: Neurology

## 2019-09-14 ENCOUNTER — Encounter: Payer: Self-pay | Admitting: Neurology

## 2019-09-14 ENCOUNTER — Other Ambulatory Visit: Payer: Self-pay

## 2019-09-14 VITALS — Ht <= 58 in | Wt 147.0 lb

## 2019-09-14 DIAGNOSIS — F0390 Unspecified dementia without behavioral disturbance: Secondary | ICD-10-CM | POA: Diagnosis not present

## 2019-09-14 DIAGNOSIS — F03A0 Unspecified dementia, mild, without behavioral disturbance, psychotic disturbance, mood disturbance, and anxiety: Secondary | ICD-10-CM

## 2019-09-14 MED ORDER — DONEPEZIL HCL 10 MG PO TABS: ORAL_TABLET | ORAL | 3 refills | Status: DC
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
## 2019-10-08 ENCOUNTER — Ambulatory Visit: Payer: Medicare Other | Attending: Internal Medicine

## 2019-10-08 DIAGNOSIS — Z23 Encounter for immunization: Secondary | ICD-10-CM | POA: Insufficient documentation

## 2019-10-08 NOTE — Progress Notes (Signed)
   Covid-19 Vaccination Clinic  Name:  Stacy Page    MRN: [id number] DOB: [birth year]  10/08/2019  [name] was observed post Covid-19 immunization for 15 minutes without incidence. She was provided with Vaccine Information Sheet and instruction to access the V-Safe system.   [name] was instructed to call 911 with any severe reactions post vaccine: Marland Kitchen Difficulty breathing  . Swelling of your face and throat  . A fast heartbeat  . A bad rash all over your body  . Dizziness and weakness    Immunizations Administered    Name Date Dose VIS Date Route   Pfizer COVID-19 Vaccine 10/08/2019  3:23 PM 0.3 mL 08/03/2019 Intramuscular   Manufacturer: [place]   Lot: [id number]   [place]: [id number]

## 2019-10-11 ENCOUNTER — Other Ambulatory Visit: Payer: Self-pay | Admitting: Internal Medicine

## 2019-10-15 ENCOUNTER — Other Ambulatory Visit: Payer: Self-pay | Admitting: Internal Medicine

## 2019-10-20 ENCOUNTER — Other Ambulatory Visit: Payer: Self-pay | Admitting: Internal Medicine

## 2019-10-24 ENCOUNTER — Telehealth: Payer: Self-pay

## 2019-10-24 NOTE — Telephone Encounter (Signed)
Juliann Pulse, patient's daughter, calling and states that the patient is getting an abscessed tooth. States that they have been trying to get her to the dentist, but every time they get her there, her blood pressure shoots up. Would like to know if she should increase the dosage the day they go to the dentist? Please advise.  Patient lives with her daughter Juliann Pulse CB#: [phone]

## 2019-10-25 NOTE — Telephone Encounter (Signed)
Called pt, LVM.   

## 2019-10-25 NOTE — Telephone Encounter (Signed)
Okay to double hctz morning of dentist visit.

## 2019-10-29 NOTE — Telephone Encounter (Signed)
Daughter called back. Informed has been given, She understood.

## 2019-10-30 ENCOUNTER — Ambulatory Visit: Payer: Medicare Other | Attending: Internal Medicine

## 2019-10-30 DIAGNOSIS — Z23 Encounter for immunization: Secondary | ICD-10-CM | POA: Insufficient documentation

## 2019-10-30 NOTE — Progress Notes (Signed)
   Covid-19 Vaccination Clinic  Name:  Stacy Page    MRN: [id number] DOB: 12-23-1932  10/30/2019  Stacy Page was observed post Covid-19 immunization for 15 minutes without incident. She was provided with Vaccine Information Sheet and instruction to access the V-Safe system.   Stacy Page was instructed to call 911 with any severe reactions post vaccine: Marland Kitchen Difficulty breathing  . Swelling of face and throat  . A fast heartbeat  . A bad rash all over body  . Dizziness and weakness   Immunizations Administered    Name Date Dose VIS Date Route   Pfizer COVID-19 Vaccine 10/30/2019  1:40 PM 0.3 mL 08/03/2019 Intramuscular   Manufacturer: [place]   Lot: [id number]   [place]: [id number]

## 2019-10-31 ENCOUNTER — Ambulatory Visit: Payer: Medicare Other

## 2019-11-09 ENCOUNTER — Other Ambulatory Visit: Payer: Self-pay | Admitting: Internal Medicine

## 2019-12-05 ENCOUNTER — Other Ambulatory Visit: Payer: Self-pay | Admitting: Internal Medicine

## 2020-01-10 ENCOUNTER — Telehealth: Payer: Self-pay

## 2020-01-10 NOTE — Telephone Encounter (Signed)
1.Medication Requested:.  atorvastatin (LIPITOR) 40 MG tablet  levocetirizine (XYZAL) 5 MG tablet  2. Pharmacy (Name, Street, [place]):[place], [place] [facility]  3. On Med List: Yes   4. Last Visit with PCP: 9.4.20   5. Next visit date with PCP: N/a    Agent: Please be advised that RX refills may take up to 3 business days. We ask that you follow-up with your pharmacy.

## 2020-01-11 ENCOUNTER — Other Ambulatory Visit: Payer: Self-pay

## 2020-01-11 ENCOUNTER — Other Ambulatory Visit: Payer: Self-pay | Admitting: Internal Medicine

## 2020-01-11 MED ORDER — LEVOCETIRIZINE DIHYDROCHLORIDE 5 MG PO TABS: ORAL_TABLET | ORAL | 2 refills | Status: DC

## 2020-01-11 MED ORDER — ATORVASTATIN CALCIUM 40 MG PO TABS: ORAL_TABLET | ORAL | 1 refills | Status: DC

## 2020-01-11 NOTE — Telephone Encounter (Signed)
Refill sent in to pharmacy 

## 2020-01-12 ENCOUNTER — Other Ambulatory Visit: Payer: Self-pay | Admitting: Internal Medicine

## 2020-01-15 ENCOUNTER — Other Ambulatory Visit: Payer: Self-pay | Admitting: Internal Medicine

## 2020-01-17 ENCOUNTER — Encounter: Payer: Self-pay | Admitting: Internal Medicine

## 2020-01-17 ENCOUNTER — Ambulatory Visit (INDEPENDENT_AMBULATORY_CARE_PROVIDER_SITE_OTHER): Payer: Medicare Other | Admitting: Internal Medicine

## 2020-01-17 VITALS — BP 160/90 | HR 70 | Temp 99.2°F | Ht <= 58 in | Wt 141.0 lb

## 2020-01-17 DIAGNOSIS — I10 Essential (primary) hypertension: Secondary | ICD-10-CM

## 2020-01-17 DIAGNOSIS — E782 Mixed hyperlipidemia: Secondary | ICD-10-CM

## 2020-01-17 DIAGNOSIS — Z0000 Encounter for general adult medical examination without abnormal findings: Secondary | ICD-10-CM | POA: Diagnosis not present

## 2020-01-17 DIAGNOSIS — J4520 Mild intermittent asthma, uncomplicated: Secondary | ICD-10-CM | POA: Diagnosis not present

## 2020-01-17 DIAGNOSIS — D649 Anemia, unspecified: Secondary | ICD-10-CM | POA: Diagnosis not present

## 2020-01-17 LAB — CBC
HCT: 38.5 % (ref 36.0–46.0)
Hemoglobin: 12.7 g/dL (ref 12.0–15.0)
MCHC: 33 g/dL (ref 30.0–36.0)
MCV: 86.8 fl (ref 78.0–100.0)
Platelets: 252 10*3/uL (ref 150.0–400.0)
RBC: 4.44 Mil/uL (ref 3.87–5.11)
RDW: 14.5 % (ref 11.5–15.5)
WBC: 5.6 10*3/uL (ref 4.0–10.5)

## 2020-01-17 LAB — COMPREHENSIVE METABOLIC PANEL
ALT: 13 U/L (ref 0–35)
AST: 18 U/L (ref 0–37)
Albumin: 4.1 g/dL (ref 3.5–5.2)
Alkaline Phosphatase: 99 U/L (ref 39–117)
BUN: 15 mg/dL (ref 6–23)
CO2: 29 mEq/L (ref 19–32)
Calcium: 9.6 mg/dL (ref 8.4–10.5)
Chloride: 102 mEq/L (ref 96–112)
Creatinine, Ser: 0.94 mg/dL (ref 0.40–1.20)
GFR: 68.5 mL/min (ref >60.00)
Glucose, Bld: 86 mg/dL (ref 70–99)
Potassium: 3.2 mEq/L — ABNORMAL LOW (ref 3.5–5.1)
Sodium: 136 mEq/L (ref 135–145)
Total Bilirubin: 0.4 mg/dL (ref 0.2–1.2)
Total Protein: 7.2 g/dL (ref 6.0–8.3)

## 2020-01-17 LAB — LIPID PANEL
Cholesterol: 204 mg/dL — ABNORMAL HIGH (ref 0–200)
HDL: 50 mg/dL (ref >39.00)
LDL Cholesterol: 136 mg/dL — ABNORMAL HIGH (ref 0–99)
NonHDL: 154.18
Total CHOL/HDL Ratio: 4
Triglycerides: 90 mg/dL (ref 0.0–149.0)
VLDL: 18 mg/dL (ref 0.0–40.0)

## 2020-01-17 MED ORDER — ATORVASTATIN CALCIUM 40 MG PO TABS: 40.0000 mg | ORAL_TABLET | Freq: Every day | ORAL | 3 refills | Status: DC

## 2020-01-17 MED ORDER — GABAPENTIN 100 MG PO CAPS: 100.0000 mg | ORAL_CAPSULE | Freq: Three times a day (TID) | ORAL | 3 refills | Status: DC | PRN

## 2020-01-17 MED ORDER — HYDROCHLOROTHIAZIDE 12.5 MG PO TABS: 12.5000 mg | ORAL_TABLET | Freq: Every day | ORAL | 3 refills | Status: DC

## 2020-01-17 NOTE — Patient Instructions (Signed)
Health Maintenance, Female Adopting a healthy lifestyle and getting preventive care are important in promoting health and wellness. Ask your health care provider about:  The right schedule for you to have regular tests and exams.  Things you can do on your own to prevent diseases and keep yourself healthy. What should I know about diet, weight, and exercise? Eat a healthy diet   Eat a diet that includes plenty of vegetables, fruits, low-fat dairy products, and lean protein.  Do not eat a lot of foods that are high in solid fats, added sugars, or sodium. Maintain a healthy weight Body mass index (BMI) is used to identify weight problems. It estimates body fat based on height and weight. Your health care provider can help determine your BMI and help you achieve or maintain a healthy weight. Get regular exercise Get regular exercise. This is one of the most important things you can do for your health. Most adults should:  Exercise for at least 150 minutes each week. The exercise should increase your heart rate and make you sweat (moderate-intensity exercise).  Do strengthening exercises at least twice a week. This is in addition to the moderate-intensity exercise.  Spend less time sitting. Even light physical activity can be beneficial. Watch cholesterol and blood lipids Have your blood tested for lipids and cholesterol at 84 years of age, then have this test every 5 years. Have your cholesterol levels checked more often if:  Your lipid or cholesterol levels are high.  You are older than 84 years of age.  You are at high risk for heart disease. What should I know about cancer screening? Depending on your health history and family history, you may need to have cancer screening at various ages. This may include screening for:  Breast cancer.  Cervical cancer.  Colorectal cancer.  Skin cancer.  Lung cancer. What should I know about heart disease, diabetes, and high blood  pressure? Blood pressure and heart disease  High blood pressure causes heart disease and increases the risk of stroke. This is more likely to develop in people who have high blood pressure readings, are of African descent, or are overweight.  Have your blood pressure checked: ? Every 3-5 years if you are 18-39 years of age. ? Every year if you are 40 years old or older. Diabetes Have regular diabetes screenings. This checks your fasting blood sugar level. Have the screening done:  Once every three years after age 40 if you are at a normal weight and have a low risk for diabetes.  More often and at a younger age if you are overweight or have a high risk for diabetes. What should I know about preventing infection? Hepatitis B If you have a higher risk for hepatitis B, you should be screened for this virus. Talk with your health care provider to find out if you are at risk for hepatitis B infection. Hepatitis C Testing is recommended for:  Everyone born from 1945 through 1965.  Anyone with known risk factors for hepatitis C. Sexually transmitted infections (STIs)  Get screened for STIs, including gonorrhea and chlamydia, if: ? You are sexually active and are younger than 84 years of age. ? You are older than 84 years of age and your health care provider tells you that you are at risk for this type of infection. ? Your sexual activity has changed since you were last screened, and you are at increased risk for chlamydia or gonorrhea. Ask your health care provider if   you are at risk.  Ask your health care provider about whether you are at high risk for HIV. Your health care provider may recommend a prescription medicine to help prevent HIV infection. If you choose to take medicine to prevent HIV, you should first get tested for HIV. You should then be tested every 3 months for as long as you are taking the medicine. Pregnancy  If you are about to stop having your period (premenopausal) and  you may become pregnant, seek counseling before you get pregnant.  Take 400 to 800 micrograms (mcg) of folic acid every day if you become pregnant.  Ask for birth control (contraception) if you want to prevent pregnancy. Osteoporosis and menopause Osteoporosis is a disease in which the bones lose minerals and strength with aging. This can result in bone fractures. If you are 65 years old or older, or if you are at risk for osteoporosis and fractures, ask your health care provider if you should:  Be screened for bone loss.  Take a calcium or vitamin D supplement to lower your risk of fractures.  Be given hormone replacement therapy (HRT) to treat symptoms of menopause. Follow these instructions at home: Lifestyle  Do not use any products that contain nicotine or tobacco, such as cigarettes, e-cigarettes, and chewing tobacco. If you need help quitting, ask your health care provider.  Do not use street drugs.  Do not share needles.  Ask your health care provider for help if you need support or information about quitting drugs. Alcohol use  Do not drink alcohol if: ? Your health care provider tells you not to drink. ? You are pregnant, may be pregnant, or are planning to become pregnant.  If you drink alcohol: ? Limit how much you use to 0-1 drink a day. ? Limit intake if you are breastfeeding.  Be aware of how much alcohol is in your drink. In the [place] one drink equals one 12 oz bottle of beer (355 mL), one 5 oz glass of wine (148 mL), or one 1 oz glass of hard liquor (44 mL). General instructions  Schedule regular health, dental, and eye exams.  Stay current with your vaccines.  Tell your health care provider if: ? You often feel depressed. ? You have ever been abused or do not feel safe at home. Summary  Adopting a healthy lifestyle and getting preventive care are important in promoting health and wellness.  Follow your health care provider's instructions about healthy  diet, exercising, and getting tested or screened for diseases.  Follow your health care provider's instructions on monitoring your cholesterol and blood pressure. This information is not intended to replace advice given to you by your health care provider. Make sure you discuss any questions you have with your health care provider. Document Revised: 08/02/2018 Document Reviewed: 08/02/2018 Elsevier Patient Education  2020 Elsevier Inc.  

## 2020-01-17 NOTE — Assessment & Plan Note (Signed)
Checking CBC and if improved will resolve.  

## 2020-01-17 NOTE — Assessment & Plan Note (Signed)
BP mildly elevated, continue hydralazine TID, hctz, losartan. Checking CMP and adjust as needed.

## 2020-01-17 NOTE — Progress Notes (Signed)
Subjective:   Patient ID: Stacy Page, female    DOB: Sep 21, 1932, 86 y.o.   MRN: [id number]  HPI Here for medicare wellness and physical, no new complaints. Please see A/P for status and treatment of chronic medical problems.   Diet: heart healthy Physical activity: sedentary Depression/mood screen: negative Hearing: intact to whispered voice Visual acuity: grossly normal with lens, performs annual eye exam  ADLs: capable Fall risk: none Home safety: good Cognitive evaluation: intact to orientation, naming, recall and repetition EOL planning: adv directives discussed    Office Visit from 01/17/2020 in [place] [facility] Total Score  0        Clinical Support from 05/17/2017 in [facility]  PHQ-9 Total Score  0      I have personally reviewed and have noted 1. The patient's medical and social history - reviewed today no changes 2. Their use of alcohol, tobacco or illicit drugs 3. Their current medications and supplements 4. The patient's functional ability including ADL's, fall risks, home safety risks and hearing or visual impairment. 5. Diet and physical activities 6. Evidence for depression or mood disorders 7. Care team reviewed and updated  Patient Care Team: Hoyt Koch, MD as PCP - General (Internal Medicine) Belva Crome, MD as PCP - Cardiology (Cardiology) Trula Slade, DPM as Consulting Physician (Podiatry) Delice Lesch Lezlie Octave, MD as Consulting Physician (Neurology) Past Medical History:  Diagnosis Date  . Allergy    seasonal  . Asthma   . GERD (gastroesophageal reflux disease)   . Hyperlipidemia   . Hypertension   . Transient cerebral ischemia    Past Surgical History:  Procedure Laterality Date  . ABDOMINAL HYSTERECTOMY     for fibroids (no BSO)  . BUNIONECTOMY Right   . CARPAL TUNNEL RELEASE     Bilaterally  . COLONOSCOPY  2011   tiny polyp ; [facility]  . Macular Degeneration       Dr Zadie Rhine  ; injections  . UMBILICAL HERNIA REPAIR     Family History  Problem Relation Age of Onset  . Alzheimer's disease Mother   . Hypertension Mother   . Stroke Mother 3  . Hypertension Father   . Heart attack Father        in 39's  . Hypertension Maternal Grandmother   . Heart attack Maternal Grandmother 73  . Diabetes Other        1/2 sister  . Colon cancer Neg Hx   . Esophageal cancer Neg Hx   . Pancreatic cancer Neg Hx   . Kidney disease Neg Hx   . Liver disease Neg Hx    Review of Systems  Constitutional: Positive for fatigue.  HENT: Negative.   Eyes: Negative.   Respiratory: Negative for cough, chest tightness and shortness of breath.   Cardiovascular: Negative for chest pain, palpitations and leg swelling.  Gastrointestinal: Negative for abdominal distention, abdominal pain, constipation, diarrhea, nausea and vomiting.  Musculoskeletal: Negative.   Skin: Negative.   Neurological: Negative.   Psychiatric/Behavioral: Negative.     Objective:  Physical Exam Constitutional:      Appearance: She is well-developed.  HENT:     Head: Normocephalic and atraumatic.  Cardiovascular:     Rate and Rhythm: Normal rate and regular rhythm.  Pulmonary:     Effort: Pulmonary effort is normal. No respiratory distress.     Breath sounds: Normal breath sounds. No wheezing or rales.  Abdominal:  General: Bowel sounds are normal. There is no distension.     Palpations: Abdomen is soft.     Tenderness: There is no abdominal tenderness. There is no rebound.  Musculoskeletal:     Cervical back: Normal range of motion.  Skin:    General: Skin is warm and dry.  Neurological:     Mental Status: She is alert and oriented to person, place, and time.     Coordination: Coordination normal.     Vitals:   01/17/20 1506  BP: (!) 160/90  Pulse: 70  Temp: 99.2 F (37.3 C)  SpO2: 100%  Weight: 141 lb (64 kg)  Height: 4\' 10"  (1.473 m)    This visit occurred during the  SARS-CoV-2 public health emergency.  Safety protocols were in place, including screening questions prior to the visit, additional usage of staff PPE, and extensive cleaning of exam room while observing appropriate contact time as indicated for disinfecting solutions.   Assessment & Plan:

## 2020-01-17 NOTE — Assessment & Plan Note (Signed)
Flu shot due yearly. Covid-19 complete. Pneumonia complete. Shingrix counseled. Tetanus due 2022. Colonoscopy aged out. Mammogram aged out, pap smear aged out and dexa due 2023. Counseled about sun safety and mole surveillance. Counseled about the dangers of distracted driving. Given 10 year screening recommendations.

## 2020-01-17 NOTE — Assessment & Plan Note (Signed)
Rare albuterol and no flare. Uses flonase for allergies and xyzal year round.

## 2020-01-17 NOTE — Assessment & Plan Note (Signed)
Checking lipid panel and adjust lipitor 40 mg daily as needed. 

## 2020-01-18 ENCOUNTER — Other Ambulatory Visit: Payer: Self-pay | Admitting: Internal Medicine

## 2020-01-18 MED ORDER — POTASSIUM CHLORIDE CRYS ER 20 MEQ PO TBCR
20.0000 meq | EXTENDED_RELEASE_TABLET | Freq: Every day | ORAL | 3 refills | Status: DC
Start: 2020-01-18 — End: 2020-12-10

## 2020-01-22 ENCOUNTER — Telehealth: Payer: Self-pay

## 2020-01-22 NOTE — Telephone Encounter (Signed)
New message    Calling for test results  

## 2020-01-23 ENCOUNTER — Telehealth: Payer: Self-pay | Admitting: Internal Medicine

## 2020-01-23 NOTE — Telephone Encounter (Signed)
Spoke with daughter Juliann Pulse, patient takes medication Lipitor Sunday, Monday, Wednesday, Friday only 1/2 tablet.   Please see result note

## 2020-01-23 NOTE — Telephone Encounter (Signed)
  [facility] calling for clarification on potassium chloride SA (KLOR-CON) 20 MEQ tablet  Ref # [id number]  Please call [phone]

## 2020-01-23 NOTE — Telephone Encounter (Signed)
Called [facility] spoke with pharmacist . Clarified prescription

## 2020-01-24 NOTE — Progress Notes (Signed)
Daughter [name]

## 2020-01-24 NOTE — Telephone Encounter (Signed)
Addressed in result note.  

## 2020-03-02 ENCOUNTER — Other Ambulatory Visit: Payer: Self-pay | Admitting: Neurology

## 2020-03-05 ENCOUNTER — Other Ambulatory Visit: Payer: Self-pay | Admitting: Internal Medicine

## 2020-03-06 ENCOUNTER — Encounter: Payer: Self-pay | Admitting: Internal Medicine

## 2020-03-06 ENCOUNTER — Other Ambulatory Visit: Payer: Self-pay

## 2020-03-06 ENCOUNTER — Ambulatory Visit (INDEPENDENT_AMBULATORY_CARE_PROVIDER_SITE_OTHER): Payer: Medicare Other | Admitting: Internal Medicine

## 2020-03-06 VITALS — BP 142/92 | HR 72 | Temp 99.1°F | Ht <= 58 in | Wt 140.0 lb

## 2020-03-06 DIAGNOSIS — R112 Nausea with vomiting, unspecified: Secondary | ICD-10-CM

## 2020-03-06 MED ORDER — ONDANSETRON HCL 4 MG PO TABS: 4.0000 mg | ORAL_TABLET | Freq: Three times a day (TID) | ORAL | 0 refills | Status: DC | PRN

## 2020-03-06 NOTE — Progress Notes (Signed)
   Subjective:   Patient ID: Stacy Page, female    DOB: Jul 09, 1933, 86 y.o.   MRN: [id number]  HPI The patient is an 86 YO female coming in for concerns about her stomach. She is having nausea and some vomiting. She just got some diarrhea in the last day or so. Denies pain in the stomach or bloating. Denies fevers or chills. Denies blood in vomit or diarrhea. Overall it is stable but not improving. Started in the last month or so. Has tried nothing. Weight is down from February but stable from May. Has not been eating as well in the last week or so. Did eat some yesterday. Different foods do not seem to affect her symptoms. She has the symptoms all the time.   Review of Systems  Constitutional: Positive for appetite change and fatigue.  HENT: Negative.   Eyes: Negative.   Respiratory: Negative for cough, chest tightness and shortness of breath.   Cardiovascular: Negative for chest pain, palpitations and leg swelling.  Gastrointestinal: Positive for diarrhea, nausea and vomiting. Negative for abdominal distention, abdominal pain and constipation.  Musculoskeletal: Negative.   Skin: Negative.   Neurological: Negative.   Psychiatric/Behavioral: Negative.     Objective:  Physical Exam Constitutional:      Appearance: She is well-developed.  HENT:     Head: Normocephalic and atraumatic.  Cardiovascular:     Rate and Rhythm: Normal rate and regular rhythm.  Pulmonary:     Effort: Pulmonary effort is normal. No respiratory distress.     Breath sounds: Normal breath sounds. No wheezing or rales.  Abdominal:     General: Bowel sounds are normal. There is no distension.     Palpations: Abdomen is soft.     Tenderness: There is no abdominal tenderness. There is no rebound.  Musculoskeletal:     Cervical back: Normal range of motion.  Skin:    General: Skin is warm and dry.  Neurological:     Mental Status: She is alert and oriented to person, place, and time.     Coordination:  Coordination normal.     Vitals:   03/06/20 1418  BP: (!) 142/92  Pulse: 72  Temp: 99.1 F (37.3 C)  TempSrc: Oral  SpO2: 96%  Weight: 140 lb (63.5 kg)  Height: 4\' 10"  (1.473 m)   This visit occurred during the SARS-CoV-2 public health emergency.  Safety protocols were in place, including screening questions prior to the visit, additional usage of staff PPE, and extensive cleaning of exam room while observing appropriate contact time as indicated for disinfecting solutions.   Assessment & Plan:

## 2020-03-06 NOTE — Patient Instructions (Signed)
We have sent in zofran to use for nausea.   We will check the labs and the scan of the stomach to find out the cause.

## 2020-03-07 LAB — COMPREHENSIVE METABOLIC PANEL
AG Ratio: 1.4 (calc) (ref 1.0–2.5)
ALT: 25 U/L (ref 6–29)
AST: 22 U/L (ref 10–35)
Albumin: 3.9 g/dL (ref 3.6–5.1)
Alkaline phosphatase (APISO): 126 U/L (ref 37–153)
BUN: 14 mg/dL (ref 7–25)
CO2: 23 mmol/L (ref 20–32)
Calcium: 9.5 mg/dL (ref 8.6–10.4)
Chloride: 104 mmol/L (ref 98–110)
Creat: 0.86 mg/dL (ref 0.60–0.88)
Globulin: 2.7 g/dL (calc) (ref 1.9–3.7)
Glucose, Bld: 81 mg/dL (ref 65–99)
Potassium: 3.9 mmol/L (ref 3.5–5.3)
Sodium: 138 mmol/L (ref 135–146)
Total Bilirubin: 0.3 mg/dL (ref 0.2–1.2)
Total Protein: 6.6 g/dL (ref 6.1–8.1)

## 2020-03-07 LAB — CBC
HCT: 38.4 % (ref 35.0–45.0)
Hemoglobin: 12.4 g/dL (ref 11.7–15.5)
MCH: 27.9 pg (ref 27.0–33.0)
MCHC: 32.3 g/dL (ref 32.0–36.0)
MCV: 86.5 fL (ref 80.0–100.0)
MPV: 10.2 fL (ref 7.5–12.5)
Platelets: 287 10*3/uL (ref 140–400)
RBC: 4.44 10*6/uL (ref 3.80–5.10)
RDW: 14.1 % (ref 11.0–15.0)
WBC: 5.8 10*3/uL (ref 3.8–10.8)

## 2020-03-07 LAB — LIPASE: Lipase: 57 U/L (ref 7–60)

## 2020-03-07 NOTE — Assessment & Plan Note (Signed)
Weight is stable. Checking CBC, CMP, lipase today and ordered CT abdomen and pelvis to be done in near future. Symptoms are unclear. Last CT abdomen pelvis early 2000s without any findings.

## 2020-03-20 ENCOUNTER — Ambulatory Visit: Payer: Medicare Other | Admitting: Neurology

## 2020-03-20 ENCOUNTER — Encounter: Payer: Self-pay | Admitting: Neurology

## 2020-03-20 ENCOUNTER — Other Ambulatory Visit: Payer: Self-pay

## 2020-03-20 VITALS — BP 182/95 | HR 71 | Ht <= 58 in | Wt 140.0 lb

## 2020-03-20 DIAGNOSIS — R400 Somnolence: Secondary | ICD-10-CM

## 2020-03-20 DIAGNOSIS — F0390 Unspecified dementia without behavioral disturbance: Secondary | ICD-10-CM | POA: Diagnosis not present

## 2020-03-20 DIAGNOSIS — F03A0 Unspecified dementia, mild, without behavioral disturbance, psychotic disturbance, mood disturbance, and anxiety: Secondary | ICD-10-CM

## 2020-03-20 MED ORDER — DONEPEZIL HCL 10 MG PO TABS: ORAL_TABLET | ORAL | 11 refills | Status: DC
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
## 2020-03-20 NOTE — Patient Instructions (Signed)
1. Schedule MRI brain without contrast  2. Continue Donepezil 10mg : take 1 tablet daily  3. Follow-up in 6 months, call for any changes  FALL PRECAUTIONS: Be cautious when walking. Scan the area for obstacles that may increase the risk of trips and falls. When getting up in the mornings, sit up at the edge of the bed for a few minutes before getting out of bed. Consider elevating the bed at the head end to avoid drop of blood pressure when getting up. Walk always in a well-lit room (use night lights in the walls). Avoid area rugs or power cords from appliances in the middle of the walkways. Use a walker or a cane if necessary and consider physical therapy for balance exercise. Get your eyesight checked regularly.   HOME SAFETY: Consider the safety of the kitchen when operating appliances like stoves, microwave oven, and blender. Consider having supervision and share cooking responsibilities until no longer able to participate in those. Accidents with firearms and other hazards in the house should be identified and addressed as well.   ABILITY TO BE LEFT ALONE: If patient is unable to contact 911 operator, consider using LifeLine, or when the need is there, arrange for someone to stay with patients. Smoking is a fire hazard, consider supervision or cessation. Risk of wandering should be assessed by caregiver and if detected at any point, supervision and safe proof recommendations should be instituted.   RECOMMENDATIONS FOR ALL PATIENTS WITH MEMORY PROBLEMS: 1. Continue to exercise (Recommend 30 minutes of walking everyday, or 3 hours every week) 2. Increase social interactions - continue going to [facility] and enjoy social gatherings with friends and family 3. Eat healthy, avoid fried foods and eat more fruits and vegetables 4. Maintain adequate blood pressure, blood sugar, and blood cholesterol level. Reducing the risk of stroke and cardiovascular disease also helps promoting better memory. 5. Avoid  stressful situations. Live a simple life and avoid aggravations. Organize your time and prepare for the next day in anticipation. 6. Sleep well, avoid any interruptions of sleep and avoid any distractions in the bedroom that may interfere with adequate sleep quality 7. Avoid sugar, avoid sweets as there is a strong link between excessive sugar intake, diabetes, and cognitive impairment The Mediterranean diet has been shown to help patients reduce the risk of progressive memory disorders and reduces cardiovascular risk. This includes eating fish, eat fruits and green leafy vegetables, nuts like almonds and hazelnuts, walnuts, and also use olive oil. Avoid fast foods and fried foods as much as possible. Avoid sweets and sugar as sugar use has been linked to worsening of memory function.  There is always a concern of gradual progression of memory problems. If this is the case, then we may need to adjust level of care according to patient needs. Support, both to the patient and caregiver, should then be put into place.

## 2020-03-31 ENCOUNTER — Other Ambulatory Visit: Payer: Medicare Other

## 2020-03-31 ENCOUNTER — Inpatient Hospital Stay: Admission: RE | Admit: 2020-03-31 | Payer: Medicare Other | Source: Ambulatory Visit

## 2020-04-04 ENCOUNTER — Ambulatory Visit: Payer: Medicare Other | Admitting: Neurology

## 2020-04-10 ENCOUNTER — Other Ambulatory Visit: Payer: Self-pay

## 2020-04-10 ENCOUNTER — Ambulatory Visit
Admission: RE | Admit: 2020-04-10 | Discharge: 2020-04-10 | Disposition: A | Discharge status: Home / Self Care | Payer: Medicare Other | Source: Ambulatory Visit | Attending: Internal Medicine | Admitting: Internal Medicine

## 2020-04-10 DIAGNOSIS — M47816 Spondylosis without myelopathy or radiculopathy, lumbar region: Secondary | ICD-10-CM | POA: Diagnosis not present

## 2020-04-10 DIAGNOSIS — K7689 Other specified diseases of liver: Secondary | ICD-10-CM | POA: Diagnosis not present

## 2020-04-10 DIAGNOSIS — M160 Bilateral primary osteoarthritis of hip: Secondary | ICD-10-CM | POA: Diagnosis not present

## 2020-04-10 DIAGNOSIS — I700 Atherosclerosis of aorta: Secondary | ICD-10-CM | POA: Diagnosis not present

## 2020-04-10 DIAGNOSIS — R112 Nausea with vomiting, unspecified: Secondary | ICD-10-CM

## 2020-04-10 MED ORDER — IOPAMIDOL (ISOVUE-300) INJECTION 61%
100.0000 mL | Freq: Once | INTRAVENOUS | Status: AC | PRN
  Administered 2020-04-10: 100 mL via INTRAVENOUS

## 2020-04-14 ENCOUNTER — Ambulatory Visit: Payer: Medicare Other | Admitting: Podiatry

## 2020-04-14 ENCOUNTER — Encounter: Payer: Self-pay | Admitting: Podiatry

## 2020-04-14 ENCOUNTER — Other Ambulatory Visit: Payer: Self-pay

## 2020-04-14 DIAGNOSIS — B353 Tinea pedis: Secondary | ICD-10-CM

## 2020-04-14 DIAGNOSIS — L819 Disorder of pigmentation, unspecified: Secondary | ICD-10-CM

## 2020-04-14 MED ORDER — KETOCONAZOLE 2 % EX CREA: 1.0000 "application " | TOPICAL_CREAM | Freq: Every day | CUTANEOUS | 2 refills | Status: DC

## 2020-04-20 NOTE — Progress Notes (Signed)
Subjective:   Patient ID: Stacy Page, female   DOB: 86 y.o.   MRN: [id number]   HPI 86 year old female presents the office today with her daughter for concerns of discoloration to her right third toe she notes some darker discoloration to the area recently.  Also she has a skin rash in between her toes that she started to notice and she does state that it itches.  No open sores.  Denies any drainage or pus or swelling.  No recent treatment.  No other concerns today.   Review of Systems  All other systems reviewed and are negative.  Past Medical History:  Diagnosis Date  . Allergy    seasonal  . Asthma   . GERD (gastroesophageal reflux disease)   . Hyperlipidemia   . Hypertension   . Transient cerebral ischemia     Past Surgical History:  Procedure Laterality Date  . ABDOMINAL HYSTERECTOMY     for fibroids (no BSO)  . BUNIONECTOMY Right   . CARPAL TUNNEL RELEASE     Bilaterally  . COLONOSCOPY  2011   tiny polyp ; [facility]  . Macular Degeneration     Dr Zadie Rhine  ; injections  . UMBILICAL HERNIA REPAIR       Current Outpatient Medications:  .  acetaminophen (TYLENOL) 500 MG tablet, Take 500 mg by mouth every 6 (six) hours as needed. , Disp: , Rfl:  .  albuterol (PROVENTIL HFA;VENTOLIN HFA) 108 (90 Base) MCG/ACT inhaler, Inhale 1-2 puffs into the lungs every 4 (four) hours as needed for wheezing or shortness of breath., Disp: , Rfl:  .  atorvastatin (LIPITOR) 40 MG tablet, Take 1 tablet (40 mg total) by mouth daily., Disp: 90 tablet, Rfl: 3 .  azelastine (ASTELIN) 0.1 % nasal spray, Place 2 sprays into both nostrils at bedtime as needed. (allergies), Disp: , Rfl:  .  clopidogrel (PLAVIX) 75 MG tablet, TAKE 1 TABLET BY MOUTH  DAILY, Disp: 90 tablet, Rfl: 1 .  cycloSPORINE (RESTASIS) 0.05 % ophthalmic emulsion, Place 1 drop into both eyes 2 (two) times daily., Disp: , Rfl:  .  donepezil (ARICEPT) 10 MG tablet, TAKE 1 TABLET DAILY, Disp: 30 tablet, Rfl: 11 .  feeding  supplement, ENSURE ENLIVE, (ENSURE ENLIVE) LIQD, Take 237 mLs by mouth 2 (two) times daily between meals., Disp: 237 mL, Rfl: 12 .  fluticasone (FLONASE) 50 MCG/ACT nasal spray, Place 2 sprays into both nostrils daily as needed for allergies or rhinitis., Disp: , Rfl:  .  gabapentin (NEURONTIN) 100 MG capsule, Take 1 capsule (100 mg total) by mouth 3 (three) times daily as needed (leg and lower back pain)., Disp: 90 capsule, Rfl: 3 .  hydrALAZINE (APRESOLINE) 100 MG tablet, TAKE 1 TABLET BY MOUTH 3  TIMES DAILY, Disp: 270 tablet, Rfl: 3 .  hydrochlorothiazide (HYDRODIURIL) 12.5 MG tablet, TAKE 1 TABLET BY MOUTH DAILY, Disp: 30 tablet, Rfl: 11 .  ketoconazole (NIZORAL) 2 % cream, Apply 1 application topically daily., Disp: 60 g, Rfl: 2 .  levocetirizine (XYZAL) 5 MG tablet, TAKE 1 TABLET(5 MG) BY MOUTH EVERY EVENING, Disp: 30 tablet, Rfl: 2 .  losartan (COZAAR) 100 MG tablet, TAKE 1 TABLET BY MOUTH  DAILY, Disp: 90 tablet, Rfl: 3 .  meclizine (ANTIVERT) 25 MG tablet, TAKE 1/2 TO 1 TABLET BY  MOUTH 3 TIMES DAILY AS  NEEDED FOR DIZZINESS OR  NAUSEA, Disp: 15 tablet, Rfl: 0 .  montelukast (SINGULAIR) 10 MG tablet, TAKE 1 TABLET BY MOUTH AT  BEDTIME,  Disp: 90 tablet, Rfl: 3 .  ondansetron (ZOFRAN) 4 MG tablet, Take 1 tablet (4 mg total) by mouth every 8 (eight) hours as needed for nausea or vomiting., Disp: 20 tablet, Rfl: 0 .  pantoprazole (PROTONIX) 20 MG tablet, TAKE 1 TABLET BY MOUTH  DAILY, Disp: 90 tablet, Rfl: 1 .  potassium chloride SA (KLOR-CON) 20 MEQ tablet, Take 1 tablet (20 mEq total) by mouth daily., Disp: 90 tablet, Rfl: 3  Allergies  Allergen Reactions  . Bystolic [Nebivolol Hcl]     Held by Hospitalist @ 05/22/14 admission due to bradycardia (P 44)  . Aspirin Other (See Comments)    gastritis  . Cardizem [Diltiazem Hcl] Other (See Comments)    bradycardia  . Amlodipine Swelling    LE swelling  . Metoclopramide Hcl Other (See Comments)    UNKNOWN  . Spironolactone Other (See  Comments)    unknown        Objective:  Physical Exam  General: AAO x3, NAD  Dermatological: Mild interdigital tinea pedis is present there is no open sores or drainage.  There is no purulence.  There is slight discoloration to the right third toe and slightly hyperpigmented but most on the dorsal aspect the toe the plantar aspect appears to be normal in color.  There is no open sores.  Normal temperature compared to the other digits.  Vascular: DP pulses 2/4, PT pulse 1/4. There is no pain with calf compression, swelling, warmth, erythema.   Neruologic: Grossly intact via light touch bilateral.  Musculoskeletal: No gross boney pedal deformities bilateral. No pain, crepitus, or limitation noted with foot and ankle range of motion bilateral. Muscular strength 5/5 in all groups tested bilateral.  Gait: Unassisted, Nonantalgic.      Assessment:   86 year old female with tinea pedis, right third toe discoloration     Plan:  -Treatment options discussed including all alternatives, risks, and complications -Etiology of symptoms were discussed -Although think her circulation is adequate will check an arterial duplex to ensure adequate circulation given the discoloration to the third toe. -Prescribed ketoconazole -Daily foot inspection  Return in about 3 months (around 07/15/2020).  Trula Slade DPM    .si

## 2020-04-23 ENCOUNTER — Encounter (HOSPITAL_COMMUNITY): Payer: Self-pay

## 2020-04-23 ENCOUNTER — Ambulatory Visit (HOSPITAL_COMMUNITY)
Admission: RE | Admit: 2020-04-23 | Discharge: 2020-04-23 | Disposition: A | Discharge status: Home / Self Care | Payer: Medicare Other | Source: Ambulatory Visit | Attending: Podiatry | Admitting: Podiatry

## 2020-04-23 ENCOUNTER — Other Ambulatory Visit: Payer: Self-pay

## 2020-04-23 ENCOUNTER — Other Ambulatory Visit: Payer: Self-pay | Admitting: Podiatry

## 2020-04-23 DIAGNOSIS — L819 Disorder of pigmentation, unspecified: Secondary | ICD-10-CM

## 2020-05-01 ENCOUNTER — Other Ambulatory Visit: Payer: Self-pay | Admitting: Internal Medicine

## 2020-05-02 ENCOUNTER — Telehealth: Payer: Self-pay

## 2020-05-02 MED ORDER — KETOCONAZOLE 2 % EX CREA
1.0000 | TOPICAL_CREAM | Freq: Every day | CUTANEOUS | 2 refills | Status: DC
Start: 2020-05-02 — End: 2022-07-08

## 2020-05-02 NOTE — Telephone Encounter (Signed)
Patient requesting Ketoconazole to be sent to [facility].  Per Dr. Jacqualyn Posey verbal, script has been sent to [facility].

## 2020-05-04 ENCOUNTER — Other Ambulatory Visit: Payer: Self-pay | Admitting: Internal Medicine

## 2020-06-10 ENCOUNTER — Ambulatory Visit (INDEPENDENT_AMBULATORY_CARE_PROVIDER_SITE_OTHER): Payer: Medicare Other | Admitting: Family

## 2020-06-10 ENCOUNTER — Encounter: Payer: Self-pay | Admitting: Family

## 2020-06-10 ENCOUNTER — Other Ambulatory Visit: Payer: Self-pay

## 2020-06-10 VITALS — BP 146/80 | HR 69 | Temp 98.4°F | Ht <= 58 in | Wt 140.0 lb

## 2020-06-10 DIAGNOSIS — R600 Localized edema: Secondary | ICD-10-CM | POA: Diagnosis not present

## 2020-06-10 LAB — COMPREHENSIVE METABOLIC PANEL
ALT: 22 U/L (ref 0–35)
AST: 22 U/L (ref 0–37)
Albumin: 3.9 g/dL (ref 3.5–5.2)
Alkaline Phosphatase: 110 U/L (ref 39–117)
BUN: 11 mg/dL (ref 6–23)
CO2: 29 mEq/L (ref 19–32)
Calcium: 9.5 mg/dL (ref 8.4–10.5)
Chloride: 101 mEq/L (ref 96–112)
Creatinine, Ser: 0.84 mg/dL (ref 0.40–1.20)
GFR: 63.26 mL/min (ref >60.00)
Glucose, Bld: 75 mg/dL (ref 70–99)
Potassium: 3.5 mEq/L (ref 3.5–5.1)
Sodium: 137 mEq/L (ref 135–145)
Total Bilirubin: 0.5 mg/dL (ref 0.2–1.2)
Total Protein: 7.1 g/dL (ref 6.0–8.3)

## 2020-06-10 LAB — BRAIN NATRIURETIC PEPTIDE: Pro B Natriuretic peptide (BNP): 49 pg/mL (ref 0.0–100.0)

## 2020-06-10 NOTE — Progress Notes (Signed)
Stacy Page is a 86 y.o. female with the following history as recorded in [employer]:  Patient Active Problem List   Diagnosis Date Noted  . Loss of weight 02/04/2016  . Nausea and vomiting 01/31/2016  . Normocytic anemia 01/30/2016  . Routine general medical examination at a health care facility 12/18/2014  . Accelerated hypertension 03/12/2014  . GERD 08/29/2008  . HYPERLIPIDEMIA 06/14/2007  . Asthma with allergic rhinitis without complication 16/05/9603  . Allergic rhinitis 11/07/2006  . SPINAL STENOSIS 11/07/2006    Current Outpatient Medications  Medication Sig Dispense Refill  . acetaminophen (TYLENOL) 500 MG tablet Take 500 mg by mouth every 6 (six) hours as needed.     Marland Kitchen albuterol (PROVENTIL HFA;VENTOLIN HFA) 108 (90 Base) MCG/ACT inhaler Inhale 1-2 puffs into the lungs every 4 (four) hours as needed for wheezing or shortness of breath.    Marland Kitchen atorvastatin (LIPITOR) 40 MG tablet Take 1 tablet (40 mg total) by mouth daily. 90 tablet 3  . azelastine (ASTELIN) 0.1 % nasal spray Place 2 sprays into both nostrils at bedtime as needed. (allergies)    . clopidogrel (PLAVIX) 75 MG tablet TAKE 1 TABLET BY MOUTH  DAILY 90 tablet 2  . cycloSPORINE (RESTASIS) 0.05 % ophthalmic emulsion Place 1 drop into both eyes 2 (two) times daily.    Marland Kitchen donepezil (ARICEPT) 10 MG tablet TAKE 1 TABLET DAILY 30 tablet 11  . feeding supplement, ENSURE ENLIVE, (ENSURE ENLIVE) LIQD Take 237 mLs by mouth 2 (two) times daily between meals. 237 mL 12  . fluticasone (FLONASE) 50 MCG/ACT nasal spray Place 2 sprays into both nostrils daily as needed for allergies or rhinitis.    Marland Kitchen gabapentin (NEURONTIN) 100 MG capsule Take 1 capsule (100 mg total) by mouth 3 (three) times daily as needed (leg and lower back pain). 90 capsule 3  . hydrALAZINE (APRESOLINE) 100 MG tablet TAKE 1 TABLET BY MOUTH 3  TIMES DAILY 270 tablet 3  . hydrochlorothiazide (HYDRODIURIL) 12.5 MG tablet TAKE 1 TABLET BY MOUTH DAILY 30 tablet 11  .  ketoconazole (NIZORAL) 2 % cream Apply 1 application topically daily. 60 g 2  . levocetirizine (XYZAL) 5 MG tablet TAKE 1 TABLET(5 MG) BY MOUTH EVERY EVENING 30 tablet 5  . losartan (COZAAR) 100 MG tablet TAKE 1 TABLET BY MOUTH  DAILY 90 tablet 3  . meclizine (ANTIVERT) 25 MG tablet TAKE 1/2 TO 1 TABLET BY  MOUTH 3 TIMES DAILY AS  NEEDED FOR DIZZINESS OR  NAUSEA 15 tablet 0  . montelukast (SINGULAIR) 10 MG tablet TAKE 1 TABLET BY MOUTH AT  BEDTIME 90 tablet 2  . ondansetron (ZOFRAN) 4 MG tablet Take 1 tablet (4 mg total) by mouth every 8 (eight) hours as needed for nausea or vomiting. 20 tablet 0  . pantoprazole (PROTONIX) 20 MG tablet TAKE 1 TABLET BY MOUTH  DAILY 90 tablet 1  . potassium chloride SA (KLOR-CON) 20 MEQ tablet Take 1 tablet (20 mEq total) by mouth daily. 90 tablet 3   No current facility-administered medications for this visit.    Allergies: Bystolic [nebivolol hcl], Aspirin, Cardizem [diltiazem hcl], Amlodipine, Metoclopramide hcl, and Spironolactone  Past Medical History:  Diagnosis Date  . Allergy    seasonal  . Asthma   . GERD (gastroesophageal reflux disease)   . Hyperlipidemia   . Hypertension   . Transient cerebral ischemia     Past Surgical History:  Procedure Laterality Date  . ABDOMINAL HYSTERECTOMY     for fibroids (no BSO)  .  BUNIONECTOMY Right   . CARPAL TUNNEL RELEASE     Bilaterally  . COLONOSCOPY  2011   tiny polyp ; [facility]  . Macular Degeneration     Dr Zadie Rhine  ; injections  . UMBILICAL HERNIA REPAIR      Family History  Problem Relation Age of Onset  . Alzheimer's disease Mother   . Hypertension Mother   . Stroke Mother 6  . Hypertension Father   . Heart attack Father        in 51's  . Hypertension Maternal Grandmother   . Heart attack Maternal Grandmother 73  . Diabetes Other        1/2 sister  . Colon cancer Neg Hx   . Esophageal cancer Neg Hx   . Pancreatic cancer Neg Hx   . Kidney disease Neg Hx   . Liver disease Neg Hx      Social History   Tobacco Use  . Smoking status: Former Smoker    Types: Cigarettes    Quit date: 08/24/1979    Years since quitting: 40.8  . Smokeless tobacco: Never Used  . Tobacco comment: smoked  1966-1981, up to 1 ppd  Substance Use Topics  . Alcohol use: No    Alcohol/week: 0.0 standard drinks    Subjective:  Accompanied by her daughter; has been having increased problems with swelling in her lower legs "for a while." Right is more problematic than left; notes that legs actually look better today than they have in a while; denies any chest pain or shortness of breath or wheezing; admits that is very sedentary; does not like to wear compression stockings; recently had ABI ordered by her podiatrist- this was normal;   Objective:  Vitals:   06/10/20 0950  BP: (!) 146/80  Pulse: 69  Temp: 98.4 F (36.9 C)  TempSrc: Oral  SpO2: 97%  Weight: 140 lb (63.5 kg)  Height: '4\' 10"'  (1.473 m)    General: Well developed, well nourished, in no acute distress  Skin : Warm and dry.  Head: Normocephalic and atraumatic  Lungs: Respirations unlabored; clear to auscultation bilaterally without wheeze, rales, rhonchi  CVS exam: normal rate and regular rhythm.  Extremities: No pitting edema, cyanosis, clubbing  Vessels: Symmetric bilaterally  Neurologic: Alert and oriented; speech intact; face symmetrical; moves all extremities well; CNII-XII intact without focal deficit   Assessment:  1. Pedal edema     Plan:  Suspect chronic venous insufficiency; patient is encouraged to try and elevate legs/ limit salt intake; she defers using compression stockings; will check Doppler but suspect it will be of low yield; check CMP, BNP today;  This visit occurred during the SARS-CoV-2 public health emergency.  Safety protocols were in place, including screening questions prior to the visit, additional usage of staff PPE, and extensive cleaning of exam room while observing appropriate contact time as  indicated for disinfecting solutions.     No follow-ups on file.  Orders Placed This Encounter  Procedures  . Comp Met (CMET)    Standing Status:   Future    Number of Occurrences:   1    Standing Expiration Date:   06/10/2021  . B Nat Peptide    Standing Status:   Future    Number of Occurrences:   1    Standing Expiration Date:   06/10/2021    Requested Prescriptions    No prescriptions requested or ordered in this encounter

## 2020-06-17 ENCOUNTER — Ambulatory Visit (HOSPITAL_COMMUNITY)
Admission: RE | Admit: 2020-06-17 | Discharge: 2020-06-17 | Disposition: A | Discharge status: Home / Self Care | Payer: Medicare Other | Source: Ambulatory Visit | Attending: Cardiovascular Disease | Admitting: Cardiovascular Disease

## 2020-06-17 DIAGNOSIS — R600 Localized edema: Secondary | ICD-10-CM | POA: Diagnosis not present

## 2020-07-15 ENCOUNTER — Ambulatory Visit: Payer: Medicare Other | Admitting: Podiatry

## 2020-07-15 ENCOUNTER — Other Ambulatory Visit: Payer: Self-pay

## 2020-07-15 DIAGNOSIS — M79675 Pain in left toe(s): Secondary | ICD-10-CM

## 2020-07-15 DIAGNOSIS — B351 Tinea unguium: Secondary | ICD-10-CM

## 2020-07-15 DIAGNOSIS — M79674 Pain in right toe(s): Secondary | ICD-10-CM | POA: Diagnosis not present

## 2020-07-21 NOTE — Progress Notes (Signed)
Subjective: 86 y.o. returns the office today for painful, elongated, thickened toenails which she cannot trim herself. Denies any redness or drainage around the nails. Denies any acute changes since last appointment and no new complaints today. Denies any systemic complaints such as fevers, chills, nausea, vomiting.   PCP: Hoyt Koch, MD   Objective: AAO 3, NAD DP/PT pulses palpable, CRT less than 3 seconds Mild chronic edema present bilaterally. Nails hypertrophic, dystrophic, elongated, brittle, discolored 10. There is tenderness overlying the nails 1-5 bilaterally. There is no surrounding erythema or drainage along the nail sites.  Discoloration to the toe that was present appears to be resolved. No open lesions or pre-ulcerative lesions are identified. No pain with calf compression, swelling, warmth, erythema.  Assessment: Patient presents with symptomatic onychomycosis  Plan: -Treatment options including alternatives, risks, complications were discussed -Nails sharply debrided 10 without complication/bleeding. -Arterial studies normal and not associated venous duplex that was negative as well.  Encourage elevation. -Discussed daily foot inspection. If there are any changes, to call the office immediately.  -Follow-up in 3 months or sooner if any problems are to arise. In the meantime, encouraged to call the office with any questions, concerns, changes symptoms.  Celesta Gentile, DPM

## 2020-07-31 ENCOUNTER — Ambulatory Visit: Payer: Medicare Other | Admitting: Podiatry

## 2020-07-31 ENCOUNTER — Other Ambulatory Visit: Payer: Self-pay

## 2020-07-31 DIAGNOSIS — M79675 Pain in left toe(s): Secondary | ICD-10-CM

## 2020-07-31 DIAGNOSIS — B351 Tinea unguium: Secondary | ICD-10-CM | POA: Diagnosis not present

## 2020-07-31 MED ORDER — CEPHALEXIN 500 MG PO CAPS: 500.0000 mg | ORAL_CAPSULE | Freq: Three times a day (TID) | ORAL | 0 refills | Status: DC

## 2020-07-31 NOTE — Patient Instructions (Signed)

## 2020-08-04 ENCOUNTER — Telehealth: Payer: Self-pay | Admitting: Internal Medicine

## 2020-08-04 NOTE — Progress Notes (Signed)
  Chronic Care Management   Outreach Note  08/04/2020 Name: Stacy Page MRN: [id number] DOB: [birth year]  Referred by: Hoyt Koch, MD Reason for referral : No chief complaint on file.   An unsuccessful telephone outreach was attempted today. The patient was referred to the pharmacist for assistance with care management and care coordination.   Follow Up Plan:   Carley Perdue [employer] Scheduler

## 2020-08-05 NOTE — Progress Notes (Signed)
Subjective: 86 year old female presents the office today requesting her left big toe and second toenails being removed but they are getting thickened and causing discomfort.  Denies any redness or drainage or any swelling to the toenail sites.  She has no other concerns today. Denies any systemic complaints such as fevers, chills, nausea, vomiting. No acute changes since last appointment, and no other complaints at this time.   Objective: AAO x3, NAD DP/PT pulses palpable bilaterally, CRT less than 3 seconds Protective sensation intact with Simms Weinstein monofilament, vibratory sensation i left hallux toenail as well as second digit toenail hypertrophic, dystrophic with yellow-brown discoloration.  Tenderness to palpation of the nail there is no edema, erythema or signs of infection. No pain with calf compression, swelling, warmth, erythema  Assessment: Symptomatic onychomycosis   Plan: -All treatment options discussed with the patient including all alternatives, risks, complications.  -At this time, the patient is requesting total nail removal with chemical matricectomy to the symptomatic portion of the nail. Risks and complications were discussed with the patient for which they understand and written consent was obtained. Under sterile conditions a total of 3 mL of a mixture of 2% lidocaine plain and 0.5% Marcaine plain was infiltrated in a hallux block fashion. Once anesthetized, the skin was prepped in sterile fashion. A tourniquet was then applied. Next the left 1st and 2nd toenails were then sharply excised making sure to remove the entire offending nail border. Once the nails were ensured to be removed area was debrided and the underlying skin was intact. There is no purulence identified in the procedure. Next phenol was then applied under standard conditions and copiously irrigated. Silvadene was applied. A dry sterile dressing was applied. After application of the dressing the tourniquet was  removed and there is found to be an immediate capillary refill time to the digit. The patient tolerated the procedure well any complications. Post procedure instructions were discussed the patient for which he verbally understood. Follow-up in one week for nail check or sooner if any problems are to arise. Discussed signs/symptoms of infection and directed to call the office immediately should any occur or go directly to the emergency room. In the meantime, encouraged to call the office with any questions, concerns, changes symptoms. -Keflex  Return in about 2 weeks (around 08/14/2020) for left nail check.  Trula Slade DPM

## 2020-08-11 ENCOUNTER — Telehealth: Payer: Self-pay | Admitting: Internal Medicine

## 2020-08-11 NOTE — Progress Notes (Signed)
  Chronic Care Management   Outreach Note  08/11/2020 Name: Stacy Page MRN: [id number] DOB: 01-05-1933  Referred by: Hoyt Koch, MD Reason for referral : No chief complaint on file.   A second unsuccessful telephone outreach was attempted today. The patient was referred to pharmacist for assistance with care management and care coordination.  Follow Up Plan:   Carley Perdue [employer] Scheduler

## 2020-09-03 ENCOUNTER — Telehealth: Payer: Self-pay | Admitting: Internal Medicine

## 2020-09-03 DIAGNOSIS — R21 Rash and other nonspecific skin eruption: Secondary | ICD-10-CM

## 2020-09-03 NOTE — Telephone Encounter (Signed)
Patient's daughter calling to request a Dermatology referral  For rash on her back No preference  Please advise

## 2020-09-04 NOTE — Telephone Encounter (Signed)
Pt's daughter informed of below.  

## 2020-09-04 NOTE — Telephone Encounter (Signed)
Referral placed, it is likely to be several months before they can see her. She can schedule with [place] sooner if desired.

## 2020-09-07 ENCOUNTER — Other Ambulatory Visit: Payer: Self-pay | Admitting: Internal Medicine

## 2020-09-11 ENCOUNTER — Telehealth: Payer: Self-pay | Admitting: Internal Medicine

## 2020-09-11 NOTE — Progress Notes (Signed)
  Chronic Care Management   Outreach Note  09/11/2020 Name: Stacy Page MRN: [id number] DOB: 1932-09-16  Referred by: Hoyt Koch, MD Reason for referral : No chief complaint on file.   Third unsuccessful telephone outreach was attempted today. The patient was referred to the pharmacist for assistance with care management and care coordination.   Follow Up Plan:   Carley Perdue [employer] Scheduler

## 2020-09-16 ENCOUNTER — Ambulatory Visit (INDEPENDENT_AMBULATORY_CARE_PROVIDER_SITE_OTHER): Payer: Medicare Other | Admitting: Internal Medicine

## 2020-09-16 ENCOUNTER — Encounter: Payer: Self-pay | Admitting: Internal Medicine

## 2020-09-16 ENCOUNTER — Other Ambulatory Visit: Payer: Self-pay

## 2020-09-16 DIAGNOSIS — R21 Rash and other nonspecific skin eruption: Secondary | ICD-10-CM

## 2020-09-16 MED ORDER — TRIAMCINOLONE ACETONIDE 0.1 % EX CREA: 1.0000 "application " | TOPICAL_CREAM | Freq: Two times a day (BID) | CUTANEOUS | 0 refills | Status: DC

## 2020-09-16 NOTE — Progress Notes (Signed)
   Subjective:   Patient ID: Stacy Page, female    DOB: 09/09/1932, 87 y.o.   MRN: [id number]  HPI The patient is an 87 YO female coming in for rash on the lower back. Going on for several months or more they cannot recall when it started exactly. It is itchy and she is scratching although tries to avoid this. She denies fevers or chills. Denies new clothes, detergent, soap, lotion. Overall it is spreading slightly and not improving. Has tried otc creams without relief.   Review of Systems  Constitutional: Negative.   HENT: Negative.   Eyes: Negative.   Respiratory: Negative for cough, chest tightness and shortness of breath.   Cardiovascular: Negative for chest pain, palpitations and leg swelling.  Gastrointestinal: Negative for abdominal distention, abdominal pain, constipation, diarrhea, nausea and vomiting.  Musculoskeletal: Negative.   Skin: Positive for rash.  Neurological: Negative.   Psychiatric/Behavioral: Negative.     Objective:  Physical Exam Constitutional:      Appearance: She is well-developed and well-nourished.  HENT:     Head: Normocephalic and atraumatic.  Eyes:     Extraocular Movements: EOM normal.  Cardiovascular:     Rate and Rhythm: Normal rate and regular rhythm.  Pulmonary:     Effort: Pulmonary effort is normal. No respiratory distress.     Breath sounds: Normal breath sounds. No wheezing or rales.  Abdominal:     General: Bowel sounds are normal. There is no distension.     Palpations: Abdomen is soft.     Tenderness: There is no abdominal tenderness. There is no rebound.  Musculoskeletal:        General: No edema.     Cervical back: Normal range of motion.  Skin:    General: Skin is warm and dry.     Comments: Some thickened skin raised without redness at the lumbar region with some stigmata of scratching, rash is limited to region patient can reach  Neurological:     Mental Status: She is alert and oriented to person, place, and time.      Coordination: Coordination normal.  Psychiatric:        Mood and Affect: Mood and affect normal.     Vitals:   09/16/20 1332  BP: 136/78  Pulse: 71  Resp: 18  Temp: 99.2 F (37.3 C)  TempSrc: Oral  SpO2: 98%  Weight: 136 lb 3.2 oz (61.8 kg)  Height: 4\' 10"  (1.473 m)    This visit occurred during the SARS-CoV-2 public health emergency.  Safety protocols were in place, including screening questions prior to the visit, additional usage of staff PPE, and extensive cleaning of exam room while observing appropriate contact time as indicated for disinfecting solutions.   Assessment & Plan:

## 2020-09-16 NOTE — Patient Instructions (Signed)
We have sent in triamcinolone ointment to use on the back twice a day to help it heal.

## 2020-09-18 DIAGNOSIS — R21 Rash and other nonspecific skin eruption: Secondary | ICD-10-CM | POA: Insufficient documentation

## 2020-09-18 NOTE — Assessment & Plan Note (Signed)
Advised to avoid scratching. Rx triamcinolone ointment and when resolving needs to use vaseline or other lotion to ensure adequate hydration to skin so this is not itching.

## 2020-09-29 ENCOUNTER — Other Ambulatory Visit: Payer: Self-pay | Admitting: Internal Medicine

## 2020-10-08 ENCOUNTER — Other Ambulatory Visit: Payer: Self-pay | Admitting: Internal Medicine

## 2020-11-11 ENCOUNTER — Other Ambulatory Visit: Payer: Self-pay | Admitting: Internal Medicine

## 2020-11-14 ENCOUNTER — Other Ambulatory Visit: Payer: Self-pay | Admitting: Internal Medicine

## 2020-11-17 ENCOUNTER — Ambulatory Visit: Payer: Medicare Other | Admitting: Neurology

## 2020-11-17 ENCOUNTER — Other Ambulatory Visit: Payer: Self-pay

## 2020-11-17 ENCOUNTER — Encounter: Payer: Self-pay | Admitting: Neurology

## 2020-11-17 VITALS — BP 165/82 | HR 88 | Resp 18 | Ht 59.0 in | Wt 140.0 lb

## 2020-11-17 DIAGNOSIS — F0390 Unspecified dementia without behavioral disturbance: Secondary | ICD-10-CM

## 2020-11-17 DIAGNOSIS — F03A0 Unspecified dementia, mild, without behavioral disturbance, psychotic disturbance, mood disturbance, and anxiety: Secondary | ICD-10-CM

## 2020-11-17 MED ORDER — DONEPEZIL HCL 10 MG PO TABS: ORAL_TABLET | ORAL | 11 refills | Status: DC

## 2020-11-17 MED ORDER — DONEPEZIL HCL 10 MG PO TABS: ORAL_TABLET | ORAL | 3 refills | Status: DC

## 2020-11-17 NOTE — Patient Instructions (Signed)
Good to see you! Continue Donepezil 10mg  daily. Encourage increasing activity, including joining a day program. Follow-up in 6 months, call for any changes   FALL PRECAUTIONS: Be cautious when walking. Scan the area for obstacles that may increase the risk of trips and falls. When getting up in the mornings, sit up at the edge of the bed for a few minutes before getting out of bed. Consider elevating the bed at the head end to avoid drop of blood pressure when getting up. Walk always in a well-lit room (use night lights in the walls). Avoid area rugs or power cords from appliances in the middle of the walkways. Use a walker or a cane if necessary and consider physical therapy for balance exercise. Get your eyesight checked regularly.  HOME SAFETY: Consider the safety of the kitchen when operating appliances like stoves, microwave oven, and blender. Consider having supervision and share cooking responsibilities until no longer able to participate in those. Accidents with firearms and other hazards in the house should be identified and addressed as well.   ABILITY TO BE LEFT ALONE: If patient is unable to contact 911 operator, consider using [employer], or when the need is there, arrange for someone to stay with patients. Smoking is a fire hazard, consider supervision or cessation. Risk of wandering should be assessed by caregiver and if detected at any point, supervision and safe proof recommendations should be instituted.  RECOMMENDATIONS FOR ALL PATIENTS WITH MEMORY PROBLEMS: 1. Continue to exercise (Recommend 30 minutes of walking everyday, or 3 hours every week) 2. Increase social interactions - continue going to [place] and enjoy social gatherings with friends and family 3. Eat healthy, avoid fried foods and eat more fruits and vegetables 4. Maintain adequate blood pressure, blood sugar, and blood cholesterol level. Reducing the risk of stroke and cardiovascular disease also helps promoting better  memory. 5. Avoid stressful situations. Live a simple life and avoid aggravations. Organize your time and prepare for the next day in anticipation. 6. Sleep well, avoid any interruptions of sleep and avoid any distractions in the bedroom that may interfere with adequate sleep quality 7. Avoid sugar, avoid sweets as there is a strong link between excessive sugar intake, diabetes, and cognitive impairment The Mediterranean diet has been shown to help patients reduce the risk of progressive memory disorders and reduces cardiovascular risk. This includes eating fish, eat fruits and green leafy vegetables, nuts like almonds and hazelnuts, walnuts, and also use olive oil. Avoid fast foods and fried foods as much as possible. Avoid sweets and sugar as sugar use has been linked to worsening of memory function.  There is always a concern of gradual progression of memory problems. If this is the case, then we may need to adjust level of care according to patient needs. Support, both to the patient and caregiver, should then be put into place.
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
## 2020-11-17 NOTE — Progress Notes (Signed)
Patient was seen, evaluated, and treatment plan was discussed with the Advanced Practice Provider. Patient repeats herself during the visit today. Daughter reports worsening repetition at home. MMSE today 27/30. Encouraged increasing physical activity, continue brain stimulation exercises. I have also reviewed the orders written for this patient which were under my direction, continue Donepezil 10mg  daily. I agree with the findings and the plan of care as documented by the Advanced Practice Provider.

## 2020-11-18 ENCOUNTER — Telehealth: Payer: Self-pay | Admitting: Internal Medicine

## 2020-11-18 MED ORDER — LEVOCETIRIZINE DIHYDROCHLORIDE 5 MG PO TABS: 5.0000 mg | ORAL_TABLET | Freq: Every evening | ORAL | 5 refills | Status: DC

## 2020-11-18 NOTE — Telephone Encounter (Signed)
  levocetirizine (XYZAL) 5 MG tablet [id number] - [place], [place] - [address] AT [facility] OF [facility] [address] [facility] [address] [place] Phone:  [phone]  Fax:  [phone]     Last seen- 01.25.22 Next apt- n/a

## 2020-11-18 NOTE — Telephone Encounter (Signed)
Medication has been sent to the patient's pharmacy.  

## 2020-12-09 ENCOUNTER — Other Ambulatory Visit: Payer: Self-pay | Admitting: Neurology

## 2020-12-09 ENCOUNTER — Other Ambulatory Visit: Payer: Self-pay | Admitting: Internal Medicine

## 2021-01-07 ENCOUNTER — Other Ambulatory Visit: Payer: Self-pay | Admitting: Internal Medicine

## 2021-01-19 ENCOUNTER — Other Ambulatory Visit: Payer: Self-pay | Admitting: Internal Medicine

## 2021-01-22 ENCOUNTER — Other Ambulatory Visit: Payer: Self-pay | Admitting: Internal Medicine

## 2021-01-29 ENCOUNTER — Other Ambulatory Visit: Payer: Self-pay

## 2021-01-29 ENCOUNTER — Ambulatory Visit (INDEPENDENT_AMBULATORY_CARE_PROVIDER_SITE_OTHER): Payer: Medicare Other

## 2021-01-29 VITALS — BP 140/72 | HR 84 | Temp 97.6°F | Ht 59.0 in | Wt 141.8 lb

## 2021-01-29 DIAGNOSIS — Z0000 Encounter for general adult medical examination without abnormal findings: Secondary | ICD-10-CM | POA: Diagnosis not present

## 2021-01-29 NOTE — Patient Instructions (Signed)
Stacy Page , Thank you for taking time to come for your Medicare Wellness Visit. I appreciate your ongoing commitment to your health goals. Please review the following plan we discussed and let me know if I can assist you in the future.   Screening recommendations/referrals: Colonoscopy: not a candidate for colon cancer screening Mammogram: 06/17/2015 Bone Density: 05/14/2016; due every 2 years Recommended yearly ophthalmology/optometry visit for glaucoma screening and checkup Recommended yearly dental visit for hygiene and checkup  Vaccinations: Influenza vaccine: due Fall 2022 Pneumococcal vaccine: completed Tdap vaccine: overdue Shingles vaccine: never done   Covid-19: 10/08/2019, 3/92021  Advanced directives: Please bring a copy of your health care power of attorney and living will to the office at your convenience.  Conditions/risks identified: Yes; Reviewed health maintenance screenings with patient today and relevant education, vaccines, and/or referrals were provided. Please continue to do your personal lifestyle choices by: daily care of teeth and gums, regular physical activity (goal should be 5 days a week for 30 minutes), eat a healthy diet, avoid tobacco and drug use, limiting any alcohol intake, taking a low-dose aspirin (if not allergic or have been advised by your provider otherwise) and taking vitamins and minerals as recommended by your provider. Continue doing brain stimulating activities (puzzles, reading, adult coloring books, staying active) to keep memory sharp. Continue to eat heart healthy diet (full of fruits, vegetables, whole grains, lean protein, water--limit salt, fat, and sugar intake) and increase physical activity as tolerated.  Next appointment: Please schedule your next Medicare Wellness Visit with your Nurse Health Advisor in 1 year by calling [phone].   Preventive Care 44 Years and Older, Female Preventive care refers to lifestyle choices and visits  with your health care provider that can promote health and wellness. What does preventive care include? A yearly physical exam. This is also called an annual well check. Dental exams once or twice a year. Routine eye exams. Ask your health care provider how often you should have your eyes checked. Personal lifestyle choices, including: Daily care of your teeth and gums. Regular physical activity. Eating a healthy diet. Avoiding tobacco and drug use. Limiting alcohol use. Practicing safe sex. Taking low-dose aspirin every day. Taking vitamin and mineral supplements as recommended by your health care provider. What happens during an annual well check? The services and screenings done by your health care provider during your annual well check will depend on your age, overall health, lifestyle risk factors, and family history of disease. Counseling  Your health care provider may ask you questions about your: Alcohol use. Tobacco use. Drug use. Emotional well-being. Home and relationship well-being. Sexual activity. Eating habits. History of falls. Memory and ability to understand (cognition). Work and work Statistician. Reproductive health. Screening  You may have the following tests or measurements: Height, weight, and BMI. Blood pressure. Lipid and cholesterol levels. These may be checked every 5 years, or more frequently if you are over 31 years old. Skin check. Lung cancer screening. You may have this screening every year starting at age 35 if you have a 30-pack-year history of smoking and currently smoke or have quit within the past 15 years. Fecal occult blood test (FOBT) of the stool. You may have this test every year starting at age 7. Flexible sigmoidoscopy or colonoscopy. You may have a sigmoidoscopy every 5 years or a colonoscopy every 10 years starting at age 48. Hepatitis C blood test. Hepatitis B blood test. Sexually transmitted disease (STD) testing. Diabetes  screening. This is  done by checking your blood sugar (glucose) after you have not eaten for a while (fasting). You may have this done every 1-3 years. Bone density scan. This is done to screen for osteoporosis. You may have this done starting at age 17. Mammogram. This may be done every 1-2 years. Talk to your health care provider about how often you should have regular mammograms. Talk with your health care provider about your test results, treatment options, and if necessary, the need for more tests. Vaccines  Your health care provider may recommend certain vaccines, such as: Influenza vaccine. This is recommended every year. Tetanus, diphtheria, and acellular pertussis (Tdap, Td) vaccine. You may need a Td booster every 10 years. Zoster vaccine. You may need this after age 13. Pneumococcal 13-valent conjugate (PCV13) vaccine. One dose is recommended after age 42. Pneumococcal polysaccharide (PPSV23) vaccine. One dose is recommended after age 6. Talk to your health care provider about which screenings and vaccines you need and how often you need them. This information is not intended to replace advice given to you by your health care provider. Make sure you discuss any questions you have with your health care provider. Document Released: 09/05/2015 Document Revised: 04/28/2016 Document Reviewed: 06/10/2015 Elsevier Interactive Patient Education  2017 [place] Prevention in the Home Falls can cause injuries. They can happen to people of all ages. There are many things you can do to make your home safe and to help prevent falls. What can I do on the outside of my home? Regularly fix the edges of walkways and driveways and fix any cracks. Remove anything that might make you trip as you walk through a door, such as a raised step or threshold. Trim any bushes or trees on the path to your home. Use bright outdoor lighting. Clear any walking paths of anything that might make someone  trip, such as rocks or tools. Regularly check to see if handrails are loose or broken. Make sure that both sides of any steps have handrails. Any raised decks and porches should have guardrails on the edges. Have any leaves, snow, or ice cleared regularly. Use sand or salt on walking paths during winter. Clean up any spills in your garage right away. This includes oil or grease spills. What can I do in the bathroom? Use night lights. Install grab bars by the toilet and in the tub and shower. Do not use towel bars as grab bars. Use non-skid mats or decals in the tub or shower. If you need to sit down in the shower, use a plastic, non-slip stool. Keep the floor dry. Clean up any water that spills on the floor as soon as it happens. Remove soap buildup in the tub or shower regularly. Attach bath mats securely with double-sided non-slip rug tape. Do not have throw rugs and other things on the floor that can make you trip. What can I do in the bedroom? Use night lights. Make sure that you have a light by your bed that is easy to reach. Do not use any sheets or blankets that are too big for your bed. They should not hang down onto the floor. Have a firm chair that has side arms. You can use this for support while you get dressed. Do not have throw rugs and other things on the floor that can make you trip. What can I do in the kitchen? Clean up any spills right away. Avoid walking on wet floors. Keep items that you use  a lot in easy-to-reach places. If you need to reach something above you, use a strong step stool that has a grab bar. Keep electrical cords out of the way. Do not use floor polish or wax that makes floors slippery. If you must use wax, use non-skid floor wax. Do not have throw rugs and other things on the floor that can make you trip. What can I do with my stairs? Do not leave any items on the stairs. Make sure that there are handrails on both sides of the stairs and use them.  Fix handrails that are broken or loose. Make sure that handrails are as long as the stairways. Check any carpeting to make sure that it is firmly attached to the stairs. Fix any carpet that is loose or worn. Avoid having throw rugs at the top or bottom of the stairs. If you do have throw rugs, attach them to the floor with carpet tape. Make sure that you have a light switch at the top of the stairs and the bottom of the stairs. If you do not have them, ask someone to add them for you. What else can I do to help prevent falls? Wear shoes that: Do not have high heels. Have rubber bottoms. Are comfortable and fit you well. Are closed at the toe. Do not wear sandals. If you use a stepladder: Make sure that it is fully opened. Do not climb a closed stepladder. Make sure that both sides of the stepladder are locked into place. Ask someone to hold it for you, if possible. Clearly mark and make sure that you can see: Any grab bars or handrails. First and last steps. Where the edge of each step is. Use tools that help you move around (mobility aids) if they are needed. These include: Canes. Walkers. Scooters. Crutches. Turn on the lights when you go into a dark area. Replace any light bulbs as soon as they burn out. Set up your furniture so you have a clear path. Avoid moving your furniture around. If any of your floors are uneven, fix them. If there are any pets around you, be aware of where they are. Review your medicines with your doctor. Some medicines can make you feel dizzy. This can increase your chance of falling. Ask your doctor what other things that you can do to help prevent falls. This information is not intended to replace advice given to you by your health care provider. Make sure you discuss any questions you have with your health care provider. Document Released: 06/05/2009 Document Revised: 01/15/2016 Document Reviewed: 09/13/2014 Elsevier Interactive Patient Education  2017  [employer].

## 2021-01-29 NOTE — Progress Notes (Addendum)
Subjective:   Stacy Page is a 87 y.o. female who presents for Medicare Annual (Subsequent) preventive examination.  Review of Systems    Cardiac Risk Factors include: advanced age (>67men, >74 women);dyslipidemia;hypertension;family history of premature cardiovascular disease     Objective:    Today's Vitals   01/29/21 1332  BP: 140/72  Pulse: 84  Temp: 97.6 F (36.4 C)  TempSrc: Temporal  SpO2: 97%  Weight: 141 lb 12.8 oz (64.3 kg)  Height: 4\' 11"  (1.499 m)  PainSc: 0-No pain   Body mass index is 28.64 kg/m.  Advanced Directives 01/29/2021 11/17/2020 03/20/2020 09/14/2019 02/15/2019 05/23/2018 05/17/2017  Does Patient Have a Medical Advance Directive? Yes Yes No Yes No No Yes  Type of Paramedic of [place];Living will - - [place];Living will  Does patient want to make changes to medical advance directive? No - Patient declined - - - - - -  Copy of [place] in Chart? No - copy requested - - - - - No - copy requested  Would patient like information on creating a medical advance directive? - - - - - Yes (ED - Information included in AVS) -  Pre-existing out of facility DNR order (yellow form or pink MOST form) - - - - - - -    Current Medications (verified) Outpatient Encounter Medications as of 01/29/2021  Medication Sig   acetaminophen (TYLENOL) 500 MG tablet Take 500 mg by mouth every 6 (six) hours as needed.    albuterol (PROVENTIL HFA;VENTOLIN HFA) 108 (90 Base) MCG/ACT inhaler Inhale 1-2 puffs into the lungs every 4 (four) hours as needed for wheezing or shortness of breath.   atorvastatin (LIPITOR) 40 MG tablet TAKE 1 TABLET BY MOUTH  DAILY   azelastine (ASTELIN) 0.1 % nasal spray Place 2 sprays into both nostrils at bedtime as needed. (allergies)   clopidogrel (PLAVIX) 75 MG tablet TAKE 1 TABLET BY MOUTH  DAILY   cycloSPORINE (RESTASIS) 0.05 % ophthalmic emulsion Place 1  drop into both eyes 2 (two) times daily.   donepezil (ARICEPT) 10 MG tablet TAKE 1 TABLET DAILY   feeding supplement, ENSURE ENLIVE, (ENSURE ENLIVE) LIQD Take 237 mLs by mouth 2 (two) times daily between meals.   fluticasone (FLONASE) 50 MCG/ACT nasal spray Place 2 sprays into both nostrils daily as needed for allergies or rhinitis.   gabapentin (NEURONTIN) 100 MG capsule Take 1 capsule (100 mg total) by mouth 3 (three) times daily as needed (leg and lower back pain).   hydrALAZINE (APRESOLINE) 100 MG tablet TAKE 1 TABLET BY MOUTH 3  TIMES DAILY   hydrochlorothiazide (HYDRODIURIL) 12.5 MG tablet Take 1 tablet (12.5 mg total) by mouth daily. Please call our office to schedule your physical at the end of the month to receive additional refills.   ketoconazole (NIZORAL) 2 % cream Apply 1 application topically daily.   levocetirizine (XYZAL) 5 MG tablet Take 1 tablet (5 mg total) by mouth every evening.   losartan (COZAAR) 100 MG tablet TAKE 1 TABLET BY MOUTH  DAILY   meclizine (ANTIVERT) 25 MG tablet TAKE 1/2 TO 1 TABLET BY  MOUTH 3 TIMES DAILY AS  NEEDED FOR DIZZINESS OR  NAUSEA   montelukast (SINGULAIR) 10 MG tablet TAKE 1 TABLET BY MOUTH AT  BEDTIME   ondansetron (ZOFRAN) 4 MG tablet Take 1 tablet (4 mg total) by mouth every 8 (eight) hours as needed for nausea or vomiting.  pantoprazole (PROTONIX) 20 MG tablet TAKE 1 TABLET BY MOUTH  DAILY   potassium chloride SA (KLOR-CON) 20 MEQ tablet TAKE 1 TABLET BY MOUTH  DAILY   triamcinolone (KENALOG) 0.1 % APPLY ONE APPLICATION TOPICALLY TWICE DAILY   No facility-administered encounter medications on file as of 01/29/2021.    Allergies (verified) Bystolic [nebivolol hcl], Aspirin, Cardizem [diltiazem hcl], Amlodipine, Metoclopramide hcl, and Spironolactone   History: Past Medical History:  Diagnosis Date   Allergy    seasonal   Asthma    GERD (gastroesophageal reflux disease)    Hyperlipidemia    Hypertension    Transient cerebral ischemia     Past Surgical History:  Procedure Laterality Date   ABDOMINAL HYSTERECTOMY     for fibroids (no BSO)   BUNIONECTOMY Right    CARPAL TUNNEL RELEASE     Bilaterally   COLONOSCOPY  2011   tiny polyp ; [facility]   Macular Degeneration     Dr Zadie Rhine  ; injections   UMBILICAL HERNIA REPAIR     Family History  Problem Relation Age of Onset   Alzheimer's disease Mother    Hypertension Mother    Stroke Mother 40   Hypertension Father    Heart attack Father        in 40's   Hypertension Maternal Grandmother    Heart attack Maternal Grandmother 73   Diabetes Other        1/2 sister   Colon cancer Neg Hx    Esophageal cancer Neg Hx    Pancreatic cancer Neg Hx    Kidney disease Neg Hx    Liver disease Neg Hx    Social History   Socioeconomic History   Marital status: Divorced    Spouse name: Not on file   Number of children: 2   Years of education: Not on file   Highest education level: Not on file  Occupational History   Not on file  Tobacco Use   Smoking status: Former    Pack years: 0.00    Types: Cigarettes    Quit date: 08/24/1979    Years since quitting: 41.4   Smokeless tobacco: Never   Tobacco comments:    smoked  1966-1981, up to 1 ppd  Vaping Use   Vaping Use: Never used  Substance and Sexual Activity   Alcohol use: No    Alcohol/week: 0.0 standard drinks   Drug use: No   Sexual activity: Never  Other Topics Concern   Not on file  Social History Narrative   Right handed    One level apartment    No Caffeine   Social Determinants of Health   Financial Resource Strain: Low Risk    Difficulty of Paying Living Expenses: Not hard at all  Food Insecurity: No Food Insecurity   Worried About Charity fundraiser in the Last Year: Never true   Ran Out of Food in the Last Year: Never true  Transportation Needs: No Transportation Needs   Lack of Transportation (Medical): No   Lack of Transportation (Non-Medical): No  Physical Activity: Inactive   Days of  Exercise per Week: 0 days   Minutes of Exercise per Session: 0 min  Stress: No Stress Concern Present   Feeling of Stress : Not at all  Social Connections: Moderately Integrated   Frequency of Communication with Friends and Family: More than three times a week   Frequency of Social Gatherings with Friends and Family: More than three times a  week   Attends Religious Services: More than 4 times per year   Active Member of Clubs or Organizations: No   Attends Archivist Meetings: More than 4 times per year   Marital Status: Widowed    Tobacco Counseling Counseling given: Not Answered Tobacco comments: smoked  [phone], up to 1 ppd   Clinical Intake:  Pre-visit preparation completed: Yes  Pain : No/denies pain Pain Score: 0-No pain     BMI - recorded: 28.64 Nutritional Status: BMI 25 -29 Overweight Nutritional Risks: None Diabetes: No  How often do you need to have someone help you when you read instructions, pamphlets, or other written materials from your doctor or pharmacy?: 1 - Never What is the last grade level you completed in school?: 11th grade  Diabetic? no  Interpreter Needed?: No  Information entered by :: Lisette Abu, LPN   Activities of Daily Living In your present state of health, do you have any difficulty performing the following activities: 01/29/2021 09/16/2020  Hearing? N N  Vision? N N  Difficulty concentrating or making decisions? N N  Walking or climbing stairs? N N  Dressing or bathing? N N  Doing errands, shopping? Y N  Preparing Food and eating ? N -  Using the Toilet? N -  In the past six months, have you accidently leaked urine? N -  Do you have problems with loss of bowel control? N -  Managing your Medications? Y -  Managing your Finances? N -  Housekeeping or managing your Housekeeping? N -  Some recent data might be hidden    Patient Care Team: Hoyt Koch, MD as PCP - General (Internal Medicine) Belva Crome, MD as PCP - Cardiology (Cardiology) Trula Slade, DPM as Consulting Physician (Podiatry) Delice Lesch Lezlie Octave, MD as Consulting Physician (Neurology)  Indicate any recent Medical Services you may have received from other than [facility] providers in the past year (date may be approximate).     Assessment:   This is a routine wellness examination for Aminah.  Hearing/Vision screen No results found.  Dietary issues and exercise activities discussed: Current Exercise Habits: The patient does not participate in regular exercise at present, Exercise limited by: respiratory conditions(s)   Goals Addressed             This Visit's Progress    Patient Stated       I would like to increase my weight and be more active.       Depression Screen PHQ 2/9 Scores 01/29/2021 01/17/2020 05/23/2018 05/17/2017 04/07/2016 01/30/2016  PHQ - 2 Score 0 0 0 0 0 0  PHQ- 9 Score - - - 0 - -    Fall Risk Fall Risk  01/29/2021 01/29/2021 11/17/2020 03/20/2020 01/17/2020  Falls in the past year? 0 0 0 0 0  Number falls in past yr: 0 0 0 0 -  Injury with Fall? 0 0 0 0 -  Risk for fall due to : No Fall Risks No Fall Risks - - -  Follow up Falls evaluation completed - - - -    FALL RISK PREVENTION PERTAINING TO THE HOME:  Any stairs in or around the home? No  If so, are there any without handrails? No  Home free of loose throw rugs in walkways, pet beds, electrical cords, etc? Yes  Adequate lighting in your home to reduce risk of falls? Yes   ASSISTIVE DEVICES UTILIZED TO PREVENT FALLS:  Life alert? No  Use of a cane, walker or w/c? No  Grab bars in the bathroom? No  Shower chair or bench in shower? No  Elevated toilet seat or a handicapped toilet? No   TIMED UP AND GO:  Was the test performed? No .  Length of time to ambulate 10 feet: 0 sec.   Gait steady and fast without use of assistive device  Cognitive Function: MMSE - Mini Mental State Exam 11/17/2020 03/20/2020 05/23/2018 05/17/2017  04/07/2016  Orientation to time 4 3 5 5 5   Orientation to Place 5 5 5 5 5   Registration 3 3 3 3 3   Attention/ Calculation 5 3 5 5 1   Recall 2 0 1 2 2   Language- name 2 objects 2 2 2 2 2   Language- repeat 1 1 1 1 1   Language- follow 3 step command 3 3 3 3 3   Language- read & follow direction 1 1 1 1 1   Write a sentence 1 1 1 1 1   Copy design 0 1 1 1 1   Total score 27 23 28 29 25    Montreal Cognitive Assessment  02/14/2019  Visuospatial/ Executive (0/5) 4  Naming (0/3) 3  Attention: Read list of digits (0/2) 1  Attention: Read list of letters (0/1) 1  Attention: Serial 7 subtraction starting at 100 (0/3) 1  Language: Repeat phrase (0/2) 2  Language : Fluency (0/1) 0  Abstraction (0/2) 0  Delayed Recall (0/5) 0  Orientation (0/6) 4  Total 16  Adjusted Score (based on education) 17      Immunizations Immunization History  Administered Date(s) Administered   Influenza Split 04/23/2012   Influenza Whole 06/14/2007, 05/23/2008, 05/26/2009, 05/13/2010   Influenza, High Dose Seasonal PF 05/12/2016, 05/17/2017, 05/23/2018, 05/19/2019   Influenza-Unspecified 04/24/2014, 05/24/2015   PFIZER(Purple Top)SARS-COV-2 Vaccination 10/08/2019, 10/30/2019, 07/01/2020   Pneumococcal Conjugate-13 04/24/2014   Pneumococcal Polysaccharide-23 02/17/2016   Tdap 11/16/2010   Zoster, Live 12/17/2012    TDAP status: Due, Education has been provided regarding the importance of this vaccine. Advised may receive this vaccine at local pharmacy or Health Dept. Aware to provide a copy of the vaccination record if obtained from local pharmacy or Health Dept. Verbalized acceptance and understanding.  Flu Vaccine status: Up to date  Pneumococcal vaccine status: Up to date  Covid-19 vaccine status: Completed vaccines  Qualifies for Shingles Vaccine? Yes   Zostavax completed Yes   Shingrix Completed?: Yes (will contact [facility] for vaccine information)  Screening Tests Health Maintenance  Topic Date  Due   Zoster Vaccines- Shingrix (1 of 2) Never done   COVID-19 Vaccine (4 - Booster for Pfizer series) 10/29/2020   TETANUS/TDAP  11/15/2020   INFLUENZA VACCINE  03/23/2021   DEXA SCAN  Completed   PNA vac Low Risk Adult  Completed   Pneumococcal Vaccine 60-28 Years old  Aged Out   HPV VACCINES  Aged Out    Health Maintenance  Health Maintenance Due  Topic Date Due   Zoster Vaccines- Shingrix (1 of 2) Never done   COVID-19 Vaccine (4 - Booster for Pfizer series) 10/29/2020   TETANUS/TDAP  11/15/2020    Colorectal cancer screening: No longer required.   Mammogram status: No longer required due to age.  Bone Density status: Completed 05/14/2016. Results reflect: Bone density results: OSTEOPENIA. Repeat every 2 years.  Lung Cancer Screening: (Low Dose CT Chest recommended if Age 33-80 years, 30 pack-year currently smoking OR have quit w/in 15years.) does not qualify.   Lung Cancer Screening Referral:  no  Additional Screening:  Hepatitis C Screening: does not qualify; Completed no  Vision Screening: Recommended annual ophthalmology exams for early detection of glaucoma and other disorders of the eye. Is the patient up to date with their annual eye exam?  Yes  Who is the provider or what is the name of the office in which the patient attends annual eye exams? Deloria Lair, MD. If pt is not established with a provider, would they like to be referred to a provider to establish care? No .   Dental Screening: Recommended annual dental exams for proper oral hygiene  Community Resource Referral / Chronic Care Management: CRR required this visit?  No   CCM required this visit?  No      Plan:     I have personally reviewed and noted the following in the patient's chart:   Medical and social history Use of alcohol, tobacco or illicit drugs  Current medications and supplements including opioid prescriptions.  Functional ability and status Nutritional status Physical  activity Advanced directives List of other physicians Hospitalizations, surgeries, and ER visits in previous 12 months Vitals Screenings to include cognitive, depression, and falls Referrals and appointments  In addition, I have reviewed and discussed with patient certain preventive protocols, quality metrics, and best practice recommendations. A written personalized care plan for preventive services as well as general preventive health recommendations were provided to patient.     Sheral Flow, LPN   [id number]   Nurse Notes: n/a

## 2021-02-20 ENCOUNTER — Other Ambulatory Visit: Payer: Self-pay | Admitting: Internal Medicine

## 2021-03-08 ENCOUNTER — Other Ambulatory Visit: Payer: Self-pay | Admitting: Internal Medicine

## 2021-03-20 ENCOUNTER — Other Ambulatory Visit: Payer: Self-pay | Admitting: Internal Medicine

## 2021-04-14 ENCOUNTER — Other Ambulatory Visit: Payer: Self-pay | Admitting: Internal Medicine

## 2021-04-30 ENCOUNTER — Other Ambulatory Visit: Payer: Self-pay | Admitting: Internal Medicine

## 2021-05-05 ENCOUNTER — Other Ambulatory Visit: Payer: Self-pay | Admitting: Internal Medicine

## 2021-05-06 ENCOUNTER — Other Ambulatory Visit: Payer: Self-pay | Admitting: Internal Medicine

## 2021-05-08 ENCOUNTER — Other Ambulatory Visit: Payer: Self-pay | Admitting: Internal Medicine

## 2021-05-21 ENCOUNTER — Ambulatory Visit: Payer: Medicare Other | Admitting: Physician Assistant

## 2021-05-21 ENCOUNTER — Encounter: Payer: Self-pay | Admitting: Physician Assistant

## 2021-05-21 ENCOUNTER — Other Ambulatory Visit: Payer: Self-pay

## 2021-05-21 DIAGNOSIS — F0390 Unspecified dementia without behavioral disturbance: Secondary | ICD-10-CM | POA: Diagnosis not present

## 2021-05-21 DIAGNOSIS — F03A0 Unspecified dementia, mild, without behavioral disturbance, psychotic disturbance, mood disturbance, and anxiety: Secondary | ICD-10-CM

## 2021-05-21 NOTE — Patient Instructions (Signed)
Good to see you! Continue Donepezil 10mg  daily.  Referral to Physical Therapy  Encourage increasing activity, including joining a day program.  Follow-up in 6 months, call for any changes   FALL PRECAUTIONS: Be cautious when walking. Scan the area for obstacles that may increase the risk of trips and falls. When getting up in the mornings, sit up at the edge of the bed for a few minutes before getting out of bed. Consider elevating the bed at the head end to avoid drop of blood pressure when getting up. Walk always in a well-lit room (use night lights in the walls). Avoid area rugs or power cords from appliances in the middle of the walkways. Use a walker or a cane if necessary and consider physical therapy for balance exercise. Get your eyesight checked regularly.  HOME SAFETY: Consider the safety of the kitchen when operating appliances like stoves, microwave oven, and blender. Consider having supervision and share cooking responsibilities until no longer able to participate in those. Accidents with firearms and other hazards in the house should be identified and addressed as well.   ABILITY TO BE LEFT ALONE: If patient is unable to contact 911 operator, consider using LifeLine, or when the need is there, arrange for someone to stay with patients. Smoking is a fire hazard, consider supervision or cessation. Risk of wandering should be assessed by caregiver and if detected at any point, supervision and safe proof recommendations should be instituted.  RECOMMENDATIONS FOR ALL PATIENTS WITH MEMORY PROBLEMS: 1. Continue to exercise (Recommend 30 minutes of walking everyday, or 3 hours every week) 2. Increase social interactions - continue going to [place] and enjoy social gatherings with friends and family 3. Eat healthy, avoid fried foods and eat more fruits and vegetables 4. Maintain adequate blood pressure, blood sugar, and blood cholesterol level. Reducing the risk of stroke and cardiovascular  disease also helps promoting better memory. 5. Avoid stressful situations. Live a simple life and avoid aggravations. Organize your time and prepare for the next day in anticipation. 6. Sleep well, avoid any interruptions of sleep and avoid any distractions in the bedroom that may interfere with adequate sleep quality 7. Avoid sugar, avoid sweets as there is a strong link between excessive sugar intake, diabetes, and cognitive impairment The Mediterranean diet has been shown to help patients reduce the risk of progressive memory disorders and reduces cardiovascular risk. This includes eating fish, eat fruits and green leafy vegetables, nuts like almonds and hazelnuts, walnuts, and also use olive oil. Avoid fast foods and fried foods as much as possible. Avoid sweets and sugar as sugar use has been linked to worsening of memory function.  There is always a concern of gradual progression of memory problems. If this is the case, then we may need to adjust level of care according to patient needs. Support, both to the patient and caregiver, should then be put into place.
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
## 2021-05-28 ENCOUNTER — Other Ambulatory Visit: Payer: Self-pay | Admitting: Internal Medicine

## 2021-05-29 ENCOUNTER — Ambulatory Visit (INDEPENDENT_AMBULATORY_CARE_PROVIDER_SITE_OTHER): Payer: Medicare Other | Admitting: Internal Medicine

## 2021-05-29 ENCOUNTER — Other Ambulatory Visit: Payer: Self-pay

## 2021-05-29 ENCOUNTER — Encounter: Payer: Self-pay | Admitting: Internal Medicine

## 2021-05-29 VITALS — BP 130/70 | HR 77 | Resp 18 | Ht 59.0 in | Wt 137.8 lb

## 2021-05-29 DIAGNOSIS — E782 Mixed hyperlipidemia: Secondary | ICD-10-CM | POA: Diagnosis not present

## 2021-05-29 DIAGNOSIS — Z0000 Encounter for general adult medical examination without abnormal findings: Secondary | ICD-10-CM

## 2021-05-29 DIAGNOSIS — I10 Essential (primary) hypertension: Secondary | ICD-10-CM | POA: Diagnosis not present

## 2021-05-29 DIAGNOSIS — J4520 Mild intermittent asthma, uncomplicated: Secondary | ICD-10-CM

## 2021-05-29 DIAGNOSIS — K649 Unspecified hemorrhoids: Secondary | ICD-10-CM | POA: Diagnosis not present

## 2021-05-29 DIAGNOSIS — K219 Gastro-esophageal reflux disease without esophagitis: Secondary | ICD-10-CM | POA: Diagnosis not present

## 2021-05-29 LAB — COMPREHENSIVE METABOLIC PANEL
ALT: 12 U/L (ref 0–35)
AST: 19 U/L (ref 0–37)
Albumin: 4 g/dL (ref 3.5–5.2)
Alkaline Phosphatase: 101 U/L (ref 39–117)
BUN: 18 mg/dL (ref 6–23)
CO2: 27 mEq/L (ref 19–32)
Calcium: 9.5 mg/dL (ref 8.4–10.5)
Chloride: 106 mEq/L (ref 96–112)
Creatinine, Ser: 0.99 mg/dL (ref 0.40–1.20)
GFR: 51.73 mL/min — ABNORMAL LOW (ref >60.00)
Glucose, Bld: 88 mg/dL (ref 70–99)
Potassium: 3.3 mEq/L — ABNORMAL LOW (ref 3.5–5.1)
Sodium: 143 mEq/L (ref 135–145)
Total Bilirubin: 0.4 mg/dL (ref 0.2–1.2)
Total Protein: 7.5 g/dL (ref 6.0–8.3)

## 2021-05-29 LAB — LIPID PANEL
Cholesterol: 182 mg/dL (ref 0–200)
HDL: 49.7 mg/dL (ref >39.00)
LDL Cholesterol: 116 mg/dL — ABNORMAL HIGH (ref 0–99)
NonHDL: 131.96
Total CHOL/HDL Ratio: 4
Triglycerides: 78 mg/dL (ref 0.0–149.0)
VLDL: 15.6 mg/dL (ref 0.0–40.0)

## 2021-05-29 LAB — CBC
HCT: 41.7 % (ref 36.0–46.0)
Hemoglobin: 13.6 g/dL (ref 12.0–15.0)
MCHC: 32.7 g/dL (ref 30.0–36.0)
MCV: 88 fl (ref 78.0–100.0)
Platelets: 225 10*3/uL (ref 150.0–400.0)
RBC: 4.73 Mil/uL (ref 3.87–5.11)
RDW: 14.7 % (ref 11.5–15.5)
WBC: 4.9 10*3/uL (ref 4.0–10.5)

## 2021-05-29 MED ORDER — ATORVASTATIN CALCIUM 40 MG PO TABS: 40.0000 mg | ORAL_TABLET | Freq: Every day | ORAL | 3 refills | Status: DC

## 2021-05-29 MED ORDER — HYDROCHLOROTHIAZIDE 12.5 MG PO TABS: 12.5000 mg | ORAL_TABLET | Freq: Every day | ORAL | 3 refills | Status: DC

## 2021-05-29 MED ORDER — POTASSIUM CHLORIDE CRYS ER 20 MEQ PO TBCR: 20.0000 meq | EXTENDED_RELEASE_TABLET | Freq: Every day | ORAL | 3 refills | Status: DC

## 2021-05-29 MED ORDER — HYDROCORTISONE 2.5 % EX CREA: TOPICAL_CREAM | Freq: Two times a day (BID) | CUTANEOUS | 0 refills | Status: DC

## 2021-05-29 NOTE — Progress Notes (Signed)
   Subjective:   Patient ID: Stacy Page, female    DOB: 10/08/1932, 87 y.o.   MRN: [id number]  HPI The patient is an 87 YO female coming in for physical.   Also having new problem of hemorrhoids starting yesterday with pain and some blood on tissue with wiping.   PMH, [facility], social history reviewed and updated  Review of Systems  Constitutional: Negative.   HENT: Negative.    Eyes: Negative.   Respiratory:  Negative for cough, chest tightness and shortness of breath.   Cardiovascular:  Negative for chest pain, palpitations and leg swelling.  Gastrointestinal:  Negative for abdominal distention, abdominal pain, constipation, diarrhea, nausea and vomiting.       Hemorrhoids  Musculoskeletal: Negative.   Skin: Negative.   Neurological: Negative.   Psychiatric/Behavioral: Negative.     Objective:  Physical Exam Constitutional:      Appearance: She is well-developed.  HENT:     Head: Normocephalic and atraumatic.  Cardiovascular:     Rate and Rhythm: Normal rate and regular rhythm.  Pulmonary:     Effort: Pulmonary effort is normal. No respiratory distress.     Breath sounds: Normal breath sounds. No wheezing or rales.  Abdominal:     General: Bowel sounds are normal. There is no distension.     Palpations: Abdomen is soft.     Tenderness: There is no abdominal tenderness. There is no rebound.  Musculoskeletal:     Cervical back: Normal range of motion.  Skin:    General: Skin is warm and dry.  Neurological:     Mental Status: She is alert and oriented to person, place, and time.     Coordination: Coordination normal.    Vitals:   05/29/21 0944  BP: 130/70  Pulse: 77  Resp: 18  SpO2: 97%  Weight: 137 lb 12.8 oz (62.5 kg)  Height: 4\' 11"  (1.499 m)    This visit occurred during the SARS-CoV-2 public health emergency.  Safety protocols were in place, including screening questions prior to the visit, additional usage of staff PPE, and extensive cleaning of exam room  while observing appropriate contact time as indicated for disinfecting solutions.   Assessment & Plan:

## 2021-05-29 NOTE — Assessment & Plan Note (Signed)
Flu shot done for season. Pneumonia complete. Shingrix counseled to get at pharmacy. Tetanus counseled to get at pharmacy. Covid-19 counseled to get booster. Colonoscopy aged out. Mammogram aged out, pap smear aged out and dexa declines further. Counseled about sun safety and mole surveillance. Counseled about the dangers of distracted driving. Given 10 year screening recommendations.

## 2021-05-29 NOTE — Assessment & Plan Note (Signed)
BP at goal on hydralazine 100 mg TID and hctz 12.5 mg daily and losartan 100 mg daily. Checking CMP and adjust dosing as needed.

## 2021-05-29 NOTE — Assessment & Plan Note (Signed)
Checking lipid panel and adjust dosing on lipitor 40 mg daily as needed.

## 2021-05-29 NOTE — Assessment & Plan Note (Signed)
Using albuterol prn and no flare today. We will continue the current plan and this is mild intermittent asthma.

## 2021-05-29 NOTE — Assessment & Plan Note (Signed)
Rx hydrocortisone 2.5% rectal ointment to use BID for up to 2 weeks and then as needed. Advised avoiding constipation or straining.

## 2021-05-29 NOTE — Assessment & Plan Note (Signed)
Taking protonix 20 mg daily. Doing well so will continue current plan.

## 2021-05-29 NOTE — Patient Instructions (Signed)
Make sure to get the covid-19 booster shot.  I have sent in a cream to use twice a day for hemorrhoids as needed.

## 2021-06-07 ENCOUNTER — Other Ambulatory Visit: Payer: Self-pay | Admitting: Internal Medicine

## 2021-06-18 ENCOUNTER — Other Ambulatory Visit: Payer: Self-pay

## 2021-06-18 DIAGNOSIS — F03A0 Unspecified dementia, mild, without behavioral disturbance, psychotic disturbance, mood disturbance, and anxiety: Secondary | ICD-10-CM

## 2021-06-22 ENCOUNTER — Other Ambulatory Visit: Payer: Self-pay

## 2021-06-22 DIAGNOSIS — R2681 Unsteadiness on feet: Secondary | ICD-10-CM

## 2021-08-05 ENCOUNTER — Ambulatory Visit: Payer: Medicare Other | Attending: Internal Medicine | Admitting: Physical Therapy

## 2021-08-05 ENCOUNTER — Other Ambulatory Visit: Payer: Self-pay

## 2021-08-05 DIAGNOSIS — M6281 Muscle weakness (generalized): Secondary | ICD-10-CM | POA: Diagnosis not present

## 2021-08-05 DIAGNOSIS — R262 Difficulty in walking, not elsewhere classified: Secondary | ICD-10-CM | POA: Diagnosis not present

## 2021-08-05 DIAGNOSIS — R2681 Unsteadiness on feet: Secondary | ICD-10-CM | POA: Insufficient documentation

## 2021-08-05 DIAGNOSIS — R2689 Other abnormalities of gait and mobility: Secondary | ICD-10-CM | POA: Insufficient documentation

## 2021-08-05 NOTE — Therapy (Signed)
[facility] [address]  [place], [place], [address] Phone: [phone]   Fax:  [phone]  Physical Therapy Evaluation  Patient Details  Name: Stacy Page MRN: [id number] Date of Birth: [birth year] Referring Provider (PT): Rondel Jumbo, [place]   Encounter Date: 08/05/2021   PT End of Session - 08/05/21 1429     Visit Number 1    Number of Visits 12    Date for PT Re-Evaluation 09/16/21    Authorization Type [employer] Medicare    Progress Note Due on Visit 10    PT Start Time 1435    PT Stop Time 1513    PT Time Calculation (min) 38 min    Activity Tolerance Patient tolerated treatment well    Behavior During Therapy WFL for tasks assessed/performed             Past Medical History:  Diagnosis Date   Allergy    seasonal   Asthma    GERD (gastroesophageal reflux disease)    Hyperlipidemia    Hypertension    Transient cerebral ischemia     Past Surgical History:  Procedure Laterality Date   ABDOMINAL HYSTERECTOMY     for fibroids (no BSO)   BUNIONECTOMY Right    CARPAL TUNNEL RELEASE     Bilaterally   COLONOSCOPY  2011   tiny polyp ; [facility]   Macular Degeneration     Dr Zadie Rhine  ; injections   UMBILICAL HERNIA REPAIR      There were no vitals filed for this visit.    Subjective Assessment - 08/05/21 1436     Subjective Pt reportedly not doing anything at home and sleeping too much. Pt lives with her daughter. Daughter reports concern over her mother's decreased activity level.    Limitations Walking    How long can you sit comfortably? n/a    How long can you stand comfortably? n/a    How long can you walk comfortably? Can get around grocery store but not motivated to go; pt reports increased back pain with prolonged walking    Patient Stated Goals Daughter wants pt to get moving some more    Currently in Pain? No/denies                [facility] PT Assessment - 08/05/21 0001        Assessment   Medical Diagnosis R26.81 (ICD-10-CM) - Gait instability    Referring Provider (PT) Rondel Jumbo, PA-C    Prior Therapy None      Precautions   Precautions None      Restrictions   Weight Bearing Restrictions No      Balance Screen   Has the patient fallen in the past 6 months No      [place] residence    Living Arrangements Spouse/significant other    Available Help at Discharge Family    Type of [place] Access Level entry    [place] None      Prior Function   Level of Independence Independent with basic ADLs    Leisure Daughter states pt used to like to walk but she walked without telling anyone and lost her way      Observation/Other Assessments   Focus on Therapeutic Outcomes (FOTO)  n/a      Posture/Postural Control   Posture/Postural Control Postural  limitations    Postural Limitations Forward head;Increased thoracic kyphosis      ROM / Strength   AROM / PROM / Strength Strength      Strength   Strength Assessment Site Hip;Knee;Ankle    Right/Left Hip Right;Left    Right Hip Flexion 3+/5    Right Hip Extension 3-/5    Right Hip ABduction 2+/5    Left Hip Flexion 3+/5    Left Hip Extension 3-/5    Left Hip ABduction 2+/5    Right/Left Knee Right;Left    Right Knee Flexion 4-/5    Right Knee Extension 4/5    Left Knee Flexion 4-/5    Left Knee Extension 4/5    Right/Left Ankle Right;Left    Right Ankle Plantar Flexion 3+/5    Left Ankle Plantar Flexion 3+/5      Transfers   Five time sit to stand comments  14 sec      Ambulation/Gait   Ambulation Distance (Feet) --   Twice around gym   Assistive device Straight cane    Gait Pattern Step-through pattern;Shuffle;Decreased stance time - right;Decreased stance time - left    Ambulation Surface Level;Indoor    Gait velocity 0.49 m/s      6 minute walk test results    Endurance additional comments  Fatigued after 3 laps around gym (reported back pain)      Standardized Balance Assessment   Standardized Balance Assessment Dynamic Gait Index;Berg Balance Test      Berg Balance Test   Sit to Stand Able to stand without using hands and stabilize independently    Standing Unsupported Able to stand safely 2 minutes    Sitting with Back Unsupported but Feet Supported on Floor or Stool Able to sit safely and securely 2 minutes    Stand to Sit Controls descent by using hands    Transfers Able to transfer safely, definite need of hands    Standing Unsupported with Eyes Closed Able to stand 10 seconds safely      Dynamic Gait Index   Level Surface Mild Impairment    Change in Gait Speed Normal    Gait with Horizontal Head Turns Moderate Impairment    Gait with Vertical Head Turns Mild Impairment    Gait and Pivot Turn Normal    Step Over Obstacle Moderate Impairment    Step Around Obstacles Normal    Steps Moderate Impairment    Total Score 16    DGI comment: 16/24; less than 19 indicates high fall risk                        Objective measurements completed on examination: See above findings.       [facility] Adult PT Treatment/Exercise - 08/05/21 0001       Exercises   Exercises Knee/Hip      Knee/Hip Exercises: Standing   Heel Raises 10 reps    Hip Flexion 10 reps      Knee/Hip Exercises: Supine   Bridges 10 reps    Other Supine Knee/Hip Exercises Clamshell red tband x10    Other Supine Knee/Hip Exercises Marching red tband x10                     PT Education - 08/05/21 1519     Education Details Discussed exam findings, POC, and initial HEP    Person(s) Educated Patient;Child(ren)    Methods Explanation;Demonstration;Tactile cues;Verbal cues;Handout  Comprehension Verbalized understanding;Returned demonstration;Verbal cues required;Tactile cues required                 PT Long Term Goals - 08/05/21 1527       PT LONG TERM GOAL  #1   Title Pt will be independent with performing HEP for continued activity at home    Time 6    Period Weeks    Status New    Target Date 09/16/21      PT LONG TERM GOAL #2   Title Pt will be able to improve DGI score to at least 20/24 to demo decreased fall risk    Baseline 16/24    Time 6    Period Weeks    Status New    Target Date 09/16/21      PT LONG TERM GOAL #3   Title Pt will have improved Berg Balance Score by at least 9 points to demo [place]    Time 6    Period Weeks    Status New    Target Date 09/16/21      PT LONG TERM GOAL #4   Title Pt will have improved gait speed by at least 0.05 m/s to demo MDIC    Baseline 0.49 m/s    Time 6    Status New    Target Date 09/16/21      PT LONG TERM GOAL #5   Title Pt will demo at least 4-/5 strength in bilat hips    Time 6    Period Weeks    Status New    Target Date 09/16/21                    Plan - 08/05/21 1520     Clinical Impression Statement [name] is an 87 y/o F presenting to OPPT due to generalized weakness and decreased activity at home. On assessment, pt demonstrates general LE weakness (hips the weakest) and decreased dynamic balance. Pt's 5xSTS indicates pt is a low/moderate fall risk. DGI and gait speed indicates she is a high fall risk. Due to fatigue/back pain unable to fully finish [employer]. Provided pt initial HEP. Pt will highly benefit from PT to address her weakness and decreased endurance to improve her mobility at home and in the community.    Personal Factors and Comorbidities Age;Fitness;Time since onset of injury/illness/exacerbation    Examination-Activity Limitations Locomotion Level;Squat;Stairs    Examination-Participation Restrictions Community Activity;Shop    Stability/Clinical Decision Making Stable/Uncomplicated    Clinical Decision Making Low    Rehab Potential Good    PT Frequency 2x / week    PT Duration 6 weeks    PT Treatment/Interventions  ADLs/Self Care Home Management;Aquatic Therapy;Electrical Stimulation;Iontophoresis 4mg /ml Dexamethasone;Moist Heat;DME Instruction;Cryotherapy;Gait training;Stair training;Functional mobility training;Therapeutic activities;Therapeutic exercise;Balance training;Neuromuscular re-education;Manual techniques;Patient/family education;Orthotic Fit/Training;Dry needling;Passive range of motion;Taping    PT Next Visit Plan Assess response to HEP and update/progress. Measure 6MWT and finish Berg Balance. Work on LE strengthening and endurance (consider Nu-step)    PT Home Exercise Plan Access Code: [email or web address]    Consulted and Agree with Plan of Care Patient             Patient will benefit from skilled therapeutic intervention in order to improve the following deficits and impairments:  Abnormal gait, Decreased range of motion, Difficulty walking, Decreased endurance, Decreased activity tolerance, Decreased balance, Decreased strength, Decreased mobility, Postural dysfunction  Visit Diagnosis: Muscle weakness (generalized)  Other abnormalities of gait  and mobility  Difficulty in walking, not elsewhere classified     Problem List Patient Active Problem List   Diagnosis Date Noted   Hemorrhoid 05/29/2021   Rash 09/18/2020   Loss of weight 02/04/2016   Normocytic anemia 01/30/2016   Routine general medical examination at a health care facility 12/18/2014   Accelerated hypertension 03/12/2014   GERD 08/29/2008   HYPERLIPIDEMIA 06/14/2007   Asthma with allergic rhinitis without complication 14/78/2956   Allergic rhinitis 11/07/2006   SPINAL STENOSIS 11/07/2006    [employer] April Gordy Levan, [place], DPT 08/05/2021, 5:25 PM  [facility] [address]  [place], [place], [address] Phone: [phone]   Fax:  [phone]  Name: Nichole Neyer MRN: [id number] Date of Birth: [birth year]

## 2021-08-12 ENCOUNTER — Other Ambulatory Visit: Payer: Self-pay

## 2021-08-12 ENCOUNTER — Ambulatory Visit: Payer: Medicare Other | Admitting: Physical Therapy

## 2021-08-12 DIAGNOSIS — R2689 Other abnormalities of gait and mobility: Secondary | ICD-10-CM | POA: Diagnosis not present

## 2021-08-12 DIAGNOSIS — M6281 Muscle weakness (generalized): Secondary | ICD-10-CM

## 2021-08-12 DIAGNOSIS — R262 Difficulty in walking, not elsewhere classified: Secondary | ICD-10-CM | POA: Diagnosis not present

## 2021-08-12 DIAGNOSIS — R2681 Unsteadiness on feet: Secondary | ICD-10-CM | POA: Diagnosis not present

## 2021-08-12 NOTE — Therapy (Signed)
[facility] [address]  [place], [place], [address] Phone: [phone]   Fax:  [phone]  Physical Therapy Treatment  Patient Details  Name: Stacy Page MRN: [id number] Date of Birth: 09-15-1932 Referring Provider (PT): Rondel Jumbo, [place]   Encounter Date: 08/12/2021   PT End of Session - 08/12/21 1407     Visit Number 2    Number of Visits 12    Date for PT Re-Evaluation 09/16/21    Authorization Type UHC Medicare    Progress Note Due on Visit 10    PT Start Time 1407   Late arrival   PT Stop Time 1452    PT Time Calculation (min) 45 min    Activity Tolerance Patient tolerated treatment well    Behavior During Therapy [facility] for tasks assessed/performed             Past Medical History:  Diagnosis Date   Allergy    seasonal   Asthma    GERD (gastroesophageal reflux disease)    Hyperlipidemia    Hypertension    Transient cerebral ischemia     Past Surgical History:  Procedure Laterality Date   ABDOMINAL HYSTERECTOMY     for fibroids (no BSO)   BUNIONECTOMY Right    CARPAL TUNNEL RELEASE     Bilaterally   COLONOSCOPY  2011   tiny polyp ; [facility]   Macular Degeneration     Dr Zadie Rhine  ; injections   UMBILICAL HERNIA REPAIR      There were no vitals filed for this visit.   Subjective Assessment - 08/12/21 1412     Subjective Pt reports she didn't want to get up today and feels tired. Pt states she didn't want to get out of bed. Daughter states pt has been doing some but not all of them.    Limitations Walking    How long can you sit comfortably? n/a    How long can you stand comfortably? n/a    How long can you walk comfortably? Can get around grocery store but not motivated to go; pt reports increased back pain with prolonged walking    Patient Stated Goals Daughter wants pt to get moving some more    Currently in Pain? No/denies                [facility] PT Assessment -  08/12/21 0001       Berg Balance Test   Sit to Stand Able to stand without using hands and stabilize independently    Standing Unsupported Able to stand safely 2 minutes    Sitting with Back Unsupported but Feet Supported on Floor or Stool Able to sit safely and securely 2 minutes    Stand to Sit Controls descent by using hands    Transfers Able to transfer safely, definite need of hands    Standing Unsupported with Eyes Closed Able to stand 10 seconds safely    Standing Unsupported with Feet Together Able to place feet together independently and stand 1 minute safely    From Standing, Reach Forward with Outstretched Arm Can reach confidently >25 cm (10")    From Standing Position, Pick up Object from Floor Able to pick up shoe, needs supervision    From Standing Position, Turn to Look Behind Over each Shoulder Looks behind one side only/other side shows less weight shift    Turn 360 Degrees Able to turn 360 degrees safely in 4  seconds or less    Standing Unsupported, Alternately Place Feet on Step/Stool Able to stand independently and complete 8 steps >20 seconds    Standing Unsupported, One Foot in Front Able to take small step independently and hold 30 seconds   only with L leg back; unable with R leg back   Standing on One Leg Tries to lift leg/unable to hold 3 seconds but remains standing independently    Total Score 46    Berg comment: 46/56                           [facility] Adult PT Treatment/Exercise - 08/12/21 0001       Exercises   Exercises Shoulder      Knee/Hip Exercises: Aerobic   Nustep L4 x 6 min      Knee/Hip Exercises: Standing   Other Standing Knee Exercises Lumbar rotatoin x10      Shoulder Exercises: Seated   Row Strengthening;Both;20 reps;Theraband    Theraband Level (Shoulder Row) Level 2 (Red)    External Rotation Strengthening;Both;20 reps;Theraband    Theraband Level (Shoulder External Rotation) Level 2 (Red)      Shoulder Exercises:  Stretch   Other Shoulder Stretches Upper trap stretch x30 sec    Other Shoulder Stretches Neck rolls x5      Modalities   Modalities Moist Heat      Moist Heat Therapy   Number Minutes Moist Heat 10 Minutes    Moist Heat Location Cervical                 Balance Exercises - 08/12/21 0001       Balance Exercises: Standing   Standing Eyes Opened Head turns;Narrow base of support (BOS);2 reps    Other Standing Exercises Forward reach x10    Other Standing Exercises Comments forward and lateral step taps on 6" step 2x10 each way                  PT Short Term Goals - 08/05/21 1727       PT SHORT TERM GOAL #1   Title Pt will be independent with HEP    Time 3    Period Weeks    Status New    Target Date 08/26/21               PT Long Term Goals - 08/05/21 1527       PT LONG TERM GOAL #1   Title Pt will be independent with performing HEP for continued activity at home    Time 6    Period Weeks    Status New    Target Date 09/16/21      PT LONG TERM GOAL #2   Title Pt will be able to improve DGI score to at least 20/24 to demo decreased fall risk    Baseline 16/24    Time 6    Period Weeks    Status New    Target Date 09/16/21      PT LONG TERM GOAL #3   Title Pt will have improved Berg Balance Score by at least 9 points to demo [place]    Time 6    Period Weeks    Status New    Target Date 09/16/21      PT LONG TERM GOAL #4   Title Pt will have improved gait speed by at least 0.05 m/s to demo MDIC  Baseline 0.49 m/s    Time 6    Status New    Target Date 09/16/21      PT LONG TERM GOAL #5   Title Pt will demo at least 4-/5 strength in bilat hips    Time 6    Period Weeks    Status New    Target Date 09/16/21                   Plan - 08/12/21 1430     Clinical Impression Statement Continued to work on strengthening and improving endurance. Pt's legs notably trembling after performing 10 reps of tapping step laterally.  Requires multiple rest breaks for fatigue. Able to finish [employer] today with pt demonstrating low to moderate fall risk. Unable to perform 6 MWT due to fatigue after exercises. Initiated upper body strengthening -- pt felt pull on R side of neck when performing shoulder ER with red band. Provided moist heat and stretching.    Personal Factors and Comorbidities Age;Fitness;Time since onset of injury/illness/exacerbation    Examination-Activity Limitations Locomotion Level;Squat;Stairs    Examination-Participation Restrictions Community Activity;Shop    PT Treatment/Interventions ADLs/Self Care Home Management;Aquatic Therapy;Electrical Stimulation;Iontophoresis 4mg /ml Dexamethasone;Moist Heat;DME Instruction;Cryotherapy;Gait training;Stair training;Functional mobility training;Therapeutic activities;Therapeutic exercise;Balance training;Neuromuscular re-education;Manual techniques;Patient/family education;Orthotic Fit/Training;Dry needling;Passive range of motion;Taping    PT Next Visit Plan Assess response to HEP and update/progress. Measure 6MWT. Work on [place] Access Code: [place] and Agree with Plan of Care Patient;Family member/caregiver    Family Member Consulted Daughter, Juliann Pulse             Patient will benefit from skilled therapeutic intervention in order to improve the following deficits and impairments:  Abnormal gait, Decreased range of motion, Difficulty walking, Decreased endurance, Decreased activity tolerance, Decreased balance, Decreased strength, Decreased mobility, Postural dysfunction  Visit Diagnosis: Muscle weakness (generalized)  Other abnormalities of gait and mobility  Difficulty in walking, not elsewhere classified     Problem List Patient Active Problem List   Diagnosis Date Noted   Hemorrhoid 05/29/2021   Rash 09/18/2020   Loss of weight 02/04/2016   Normocytic anemia 01/30/2016    Routine general medical examination at a health care facility 12/18/2014   Accelerated hypertension 03/12/2014   GERD 08/29/2008   HYPERLIPIDEMIA 06/14/2007   Asthma with allergic rhinitis without complication 54/62/7035   Allergic rhinitis 11/07/2006   SPINAL STENOSIS 11/07/2006    [facility] April Gordy Levan, [place], DPT 08/12/2021, 2:51 PM  [facility] [address]  [place], [place], [address] Phone: [phone]   Fax:  [phone]  Name: Deonne Rooks MRN: [id number] Date of Birth: [birth year]

## 2021-08-19 ENCOUNTER — Ambulatory Visit: Payer: Medicare Other | Admitting: Physical Therapy

## 2021-08-19 ENCOUNTER — Other Ambulatory Visit: Payer: Self-pay

## 2021-08-19 DIAGNOSIS — R262 Difficulty in walking, not elsewhere classified: Secondary | ICD-10-CM | POA: Diagnosis not present

## 2021-08-19 DIAGNOSIS — R2681 Unsteadiness on feet: Secondary | ICD-10-CM | POA: Diagnosis not present

## 2021-08-19 DIAGNOSIS — M6281 Muscle weakness (generalized): Secondary | ICD-10-CM

## 2021-08-19 DIAGNOSIS — R2689 Other abnormalities of gait and mobility: Secondary | ICD-10-CM

## 2021-08-19 NOTE — Therapy (Signed)
[facility] [address]  [place], [place], [address] Phone: [phone]   Fax:  [phone]  Physical Therapy Treatment  Patient Details  Name: Stacy Page MRN: [id number] Date of Birth: October 04, 1932 Referring Provider (PT): Rondel Jumbo, [place]   Encounter Date: 08/19/2021   PT End of Session - 08/19/21 1407     Visit Number 3    Number of Visits 12    Date for PT Re-Evaluation 09/16/21    Authorization Type UHC Medicare    Progress Note Due on Visit 10    PT Start Time 1407    PT Stop Time 1445    PT Time Calculation (min) 38 min    Activity Tolerance Patient tolerated treatment well    Behavior During Therapy WFL for tasks assessed/performed             Past Medical History:  Diagnosis Date   Allergy    seasonal   Asthma    GERD (gastroesophageal reflux disease)    Hyperlipidemia    Hypertension    Transient cerebral ischemia     Past Surgical History:  Procedure Laterality Date   ABDOMINAL HYSTERECTOMY     for fibroids (no BSO)   BUNIONECTOMY Right    CARPAL TUNNEL RELEASE     Bilaterally   COLONOSCOPY  2011   tiny polyp ; [facility]   Macular Degeneration     Dr Zadie Rhine  ; injections   UMBILICAL HERNIA REPAIR      There were no vitals filed for this visit.   Subjective Assessment - 08/19/21 1411     Subjective Pt reports increased soreness after last session. She states she has not done any exercises since then    Limitations Walking    How long can you sit comfortably? n/a    How long can you stand comfortably? n/a    How long can you walk comfortably? Can get around grocery store but not motivated to go; pt reports increased back pain with prolonged walking    Patient Stated Goals Daughter wants pt to get moving some more    Currently in Pain? No/denies                               [facility] Adult PT Treatment/Exercise - 08/19/21 0001       Knee/Hip  Exercises: Aerobic   Nustep L5 x 10 min for endurance; cues to maintain at least 50 steps/min      Knee/Hip Exercises: Standing   Other Standing Knee Exercises Red weighted ball diagonals 2x10 each side      Knee/Hip Exercises: Seated   Long Arc Quad Strengthening;Right;Left;2 sets;10 reps    Long Arc Quad Limitations red tband    Hamstring Curl Strengthening;Right;Left;2 sets;10 reps    Hamstring Limitations red tband    Sit to [employer] 10 reps;without UE support   red weighted ball press     Knee/Hip Exercises: Supine   Bridges Strengthening;Both;2 sets;10 reps    Other Supine Knee/Hip Exercises Marching 2x10 RTB      Knee/Hip Exercises: Sidelying   Clams 2x10 red tband                       PT Short Term Goals - 08/05/21 1727       PT SHORT TERM GOAL #1   Title Pt will be independent with  HEP    Time 3    Period Weeks    Status New    Target Date 08/26/21               PT Long Term Goals - 08/05/21 1527       PT LONG TERM GOAL #1   Title Pt will be independent with performing HEP for continued activity at home    Time 6    Period Weeks    Status New    Target Date 09/16/21      PT LONG TERM GOAL #2   Title Pt will be able to improve DGI score to at least 20/24 to demo decreased fall risk    Baseline 16/24    Time 6    Period Weeks    Status New    Target Date 09/16/21      PT LONG TERM GOAL #3   Title Pt will have improved Berg Balance Score by at least 9 points to demo [place]    Time 6    Period Weeks    Status New    Target Date 09/16/21      PT LONG TERM GOAL #4   Title Pt will have improved gait speed by at least 0.05 m/s to demo MDIC    Baseline 0.49 m/s    Time 6    Status New    Target Date 09/16/21      PT LONG TERM GOAL #5   Title Pt will demo at least 4-/5 strength in bilat hips    Time 6    Period Weeks    Status New    Target Date 09/16/21                   Plan - 08/19/21 1436     Clinical Impression  Statement Pt tolerated exercises well. Focused primarily on LE and trunk strengthening this session. Rest breaks as needed for fatigue. Fatigued and sore after performing sidelying clamshells.    Personal Factors and Comorbidities Age;Fitness;Time since onset of injury/illness/exacerbation    Examination-Activity Limitations Locomotion Level;Squat;Stairs    Examination-Participation Restrictions Community Activity;Shop    PT Treatment/Interventions ADLs/Self Care Home Management;Aquatic Therapy;Electrical Stimulation;Iontophoresis 4mg /ml Dexamethasone;Moist Heat;DME Instruction;Cryotherapy;Gait training;Stair training;Functional mobility training;Therapeutic activities;Therapeutic exercise;Balance training;Neuromuscular re-education;Manual techniques;Patient/family education;Orthotic Fit/Training;Dry needling;Passive range of motion;Taping    PT Next Visit Plan Assess response to HEP and update/progress. Measure 6MWT. Work on [place] Access Code: [place] and Agree with Plan of Care Patient;Family member/caregiver    Family Member Consulted Daughter, Juliann Pulse             Patient will benefit from skilled therapeutic intervention in order to improve the following deficits and impairments:  Abnormal gait, Decreased range of motion, Difficulty walking, Decreased endurance, Decreased activity tolerance, Decreased balance, Decreased strength, Decreased mobility, Postural dysfunction  Visit Diagnosis: Muscle weakness (generalized)  Other abnormalities of gait and mobility  Difficulty in walking, not elsewhere classified     Problem List Patient Active Problem List   Diagnosis Date Noted   Hemorrhoid 05/29/2021   Rash 09/18/2020   Loss of weight 02/04/2016   Normocytic anemia 01/30/2016   Routine general medical examination at a health care facility 12/18/2014   Accelerated hypertension 03/12/2014   GERD 08/29/2008    HYPERLIPIDEMIA 06/14/2007   Asthma with allergic rhinitis without complication 76/73/4193   Allergic rhinitis 11/07/2006   SPINAL STENOSIS 11/07/2006  [facility] [address] PT, DPT 08/19/2021, 2:42 PM  [facility] [address]  [place], [place], [address] Phone: [phone]   Fax:  [phone]  Name: Stacy Page MRN: [id number] Date of Birth: [birth year]

## 2021-08-26 ENCOUNTER — Ambulatory Visit: Payer: Medicare Other

## 2021-08-31 ENCOUNTER — Other Ambulatory Visit: Payer: Self-pay

## 2021-08-31 ENCOUNTER — Encounter: Payer: Self-pay | Admitting: Physical Therapy

## 2021-08-31 ENCOUNTER — Ambulatory Visit: Payer: Medicare Other | Attending: Internal Medicine | Admitting: Physical Therapy

## 2021-08-31 DIAGNOSIS — R2689 Other abnormalities of gait and mobility: Secondary | ICD-10-CM | POA: Insufficient documentation

## 2021-08-31 DIAGNOSIS — M6281 Muscle weakness (generalized): Secondary | ICD-10-CM | POA: Insufficient documentation

## 2021-08-31 DIAGNOSIS — R262 Difficulty in walking, not elsewhere classified: Secondary | ICD-10-CM | POA: Diagnosis not present

## 2021-08-31 NOTE — Therapy (Signed)
[facility] [address]  [place], [place], [address] Phone: [phone]   Fax:  [phone]  Physical Therapy Treatment  Patient Details  Name: Stacy Page MRN: [id number] Date of Birth: Dec 24, 1932 Referring Provider (PT): Rondel Jumbo, [place]   Encounter Date: 08/31/2021   PT End of Session - 08/31/21 1503     Visit Number 4    Number of Visits 12    Date for PT Re-Evaluation 09/16/21    Authorization Type UHC Medicare    Progress Note Due on Visit 10    PT Start Time 1503    PT Stop Time 1554    PT Time Calculation (min) 51 min    Activity Tolerance Patient tolerated treatment well    Behavior During Therapy WFL for tasks assessed/performed             Past Medical History:  Diagnosis Date   Allergy    seasonal   Asthma    GERD (gastroesophageal reflux disease)    Hyperlipidemia    Hypertension    Transient cerebral ischemia     Past Surgical History:  Procedure Laterality Date   ABDOMINAL HYSTERECTOMY     for fibroids (no BSO)   BUNIONECTOMY Right    CARPAL TUNNEL RELEASE     Bilaterally   COLONOSCOPY  2011   tiny polyp ; [facility]   Macular Degeneration     Dr Zadie Rhine  ; injections   UMBILICAL HERNIA REPAIR      There were no vitals filed for this visit.   Subjective Assessment - 08/31/21 1536     Subjective Pt reports she has not been doing the HEP at home.    Patient Stated Goals Daughter wants pt to get moving some more    Currently in Pain? Yes    Pain Score 7     Pain Location Sacrum    Pain Orientation Mid;Lower    Pain Descriptors / Indicators Sore    Pain Type Acute pain    Pain Frequency Intermittent                [facility] PT Assessment - 08/31/21 1503       6 Minute Walk- Baseline   6 Minute Walk- Baseline yes    BP (mmHg) 128/68    HR (bpm) 87    02 Sat (%RA) 98 %    Modified Borg Scale for Dyspnea 0- Nothing at all    Perceived Rate of Exertion  (Borg) 9- very light      6 Minute walk- Post Test   6 Minute Walk Post Test yes    BP (mmHg) 122/76    HR (bpm) 100    02 Sat (%RA) 98 %    Modified Borg Scale for Dyspnea 2- Mild shortness of breath    Perceived Rate of Exertion (Borg) 9- very light      6 minute walk test results    Aerobic Endurance Distance Walked 359    Endurance additional comments 3 seated rest breaks d/t LBP - 25 sec seated rest break at 2:00, 30 sec seated rest break at 3:30, 60 sec seated rest break at 5:00                           [facility] Adult PT Treatment/Exercise - 08/31/21 0001       Knee/Hip Exercises: Standing   Heel Raises 10  reps    Heel Raises Limitations counter support    Hip Flexion Both;10 reps;Knee bent;AROM;Stengthening    Hip Flexion Limitations counter support      Knee/Hip Exercises: Seated   Long Arc Quad Right;Left;10 reps;Strengthening    Long Arc Quad Limitations red TB    Hamstring Curl Right;Left;10 reps;Strengthening    Hamstring Limitations red TB      Knee/Hip Exercises: Supine   Bridges 10 reps;Strengthening    Other Supine Knee/Hip Exercises TrA + red TB march x 10      Knee/Hip Exercises: Sidelying   Clams 2x10 red TB      Shoulder Exercises: Supine   External Rotation Both;10 reps;Strengthening;Theraband    Theraband Level (Shoulder External Rotation) Level 2 (Red)      Shoulder Exercises: Seated   Row Both;10 reps;Strengthening;Theraband    Theraband Level (Shoulder Row) Level 2 (Red)                       PT Short Term Goals - 08/31/21 1550       PT SHORT TERM GOAL #1   Title Pt will be independent with HEP    Time 3    Period Weeks    Status On-going   08/31/21 - pt has not attempted HEP at home   Target Date 08/26/21               PT Long Term Goals - 08/31/21 1551       PT LONG TERM GOAL #1   Title Pt will be independent with performing HEP for continued activity at home    Time 6    Period Weeks    Status  On-going    Target Date 09/16/21      PT LONG TERM GOAL #2   Title Pt will be able to improve DGI score to at least 20/24 to demo decreased fall risk    Baseline 16/24    Time 6    Period Weeks    Status On-going    Target Date 09/16/21      PT LONG TERM GOAL #3   Title Pt will have improved Berg Balance Score by at least 9 points to demo [place]    Time 6    Period Weeks    Status On-going    Target Date 09/16/21      PT LONG TERM GOAL #4   Title Pt will have improved gait speed by at least 0.05 m/s to demo MDIC    Baseline 0.49 m/s    Time 6    Status On-going    Target Date 09/16/21      PT LONG TERM GOAL #5   Title Pt will demo at least 4-/5 strength in bilat hips    Time 6    Period Weeks    Status On-going    Target Date 09/16/21                   Plan - 08/31/21 1554     Clinical Impression Statement 6-min walk test completed with stable VS but limited due to LBP requiring 3 seated rest breaks. During 6MWT, pts cane noted to be excessively tall for her height causing the cane to slip backwards as she walks, therefore adjustment made to lower cane with pt noting much improved comfort at new height. Stacy Page admits to not working on her HEP and upon review seems a bit overwhelmed with quantity of exercises,  therefore exercises broken up into 2 groups to be completed on rotating days with day off on 3rd day to make exercises more manageable at home.    Rehab Potential Good    PT Frequency 2x / week    PT Duration 6 weeks    PT Treatment/Interventions ADLs/Self Care Home Management;Aquatic Therapy;Electrical Stimulation;Iontophoresis 4mg /ml Dexamethasone;Moist Heat;DME Instruction;Cryotherapy;Gait training;Stair training;Functional mobility training;Therapeutic activities;Therapeutic exercise;Balance training;Neuromuscular re-education;Manual techniques;Patient/family education;Orthotic Fit/Training;Dry needling;Passive range of motion;Taping    PT Next Visit Plan  Assess response to HEP modifcation and update/progress as indicated. Work on LE strengthening and endurance.    PT Home Exercise Plan Access Code: [email or web address]    Consulted and Agree with Plan of Care Patient;Family member/caregiver    Family Member Consulted Daughter, Juliann Pulse             Patient will benefit from skilled therapeutic intervention in order to improve the following deficits and impairments:  Abnormal gait, Decreased range of motion, Difficulty walking, Decreased endurance, Decreased activity tolerance, Decreased balance, Decreased strength, Decreased mobility, Postural dysfunction  Visit Diagnosis: Muscle weakness (generalized)  Other abnormalities of gait and mobility  Difficulty in walking, not elsewhere classified     Problem List Patient Active Problem List   Diagnosis Date Noted   Hemorrhoid 05/29/2021   Rash 09/18/2020   Loss of weight 02/04/2016   Normocytic anemia 01/30/2016   Routine general medical examination at a health care facility 12/18/2014   Accelerated hypertension 03/12/2014   GERD 08/29/2008   HYPERLIPIDEMIA 06/14/2007   Asthma with allergic rhinitis without complication 89/16/9450   Allergic rhinitis 11/07/2006   SPINAL STENOSIS 11/07/2006    Percival Spanish, PT 08/31/2021, [facility] [address]  [place], [place], [address] Phone: [phone]   Fax:  [phone]  Name: Stacy Page MRN: [id number] Date of Birth: May 30, 1933

## 2021-08-31 NOTE — Patient Instructions (Signed)
° ° °  Access Code: [email or web address] URL: [email or web address] Date: 08/31/2021 Prepared by: Annie Paras  Exercises (split into 2 groups to be completed on separate days with rest day following 2nd day)  Supine Bridge - 1 x daily - 7 x weekly - 2 sets - 10 reps - 3-5 sec hold Supine March with Resistance Band - 1 x daily - 7 x weekly - 2 sets - 10 reps - 2-3 sec hold hold Clamshell with Resistance - 1 x daily - 7 x weekly - 2 sets - 10 reps - 2-3 sec hold Supine Shoulder External Rotation with Resistance - 1 x daily - 7 x weekly - 2 sets - 10 reps - 2-3 sec hold  Standing March with Counter Support - 1 x daily - 7 x weekly - 2 sets - 10 reps Heel Raises with Counter Support - 1 x daily - 7 x weekly - 2 sets - 10 reps Seated Shoulder Row with Anchored Resistance - 1 x daily - 7 x weekly - 2 sets - 10 reps - 3-5 sec hold Seated Knee Extension with Resistance - 1 x daily - 7 x weekly - 2 sets - 10 reps Seated Hamstring Curls with Resistance - 1 x daily - 7 x weekly - 2 sets - 10 reps

## 2021-09-03 ENCOUNTER — Ambulatory Visit: Payer: Medicare Other

## 2021-09-03 ENCOUNTER — Other Ambulatory Visit: Payer: Self-pay

## 2021-09-03 DIAGNOSIS — M6281 Muscle weakness (generalized): Secondary | ICD-10-CM | POA: Diagnosis not present

## 2021-09-03 DIAGNOSIS — R2689 Other abnormalities of gait and mobility: Secondary | ICD-10-CM

## 2021-09-03 DIAGNOSIS — R262 Difficulty in walking, not elsewhere classified: Secondary | ICD-10-CM

## 2021-09-03 NOTE — Therapy (Signed)
[facility] [address]  [place], [place], [address] Phone: [phone]   Fax:  [phone]  Physical Therapy Treatment  Patient Details  Name: Stacy Page MRN: [id number] Date of Birth: 05/25/1933 Referring Provider (PT): Rondel Jumbo, [place]   Encounter Date: 09/03/2021   PT End of Session - 09/03/21 1546     Visit Number 5    Number of Visits 12    Date for PT Re-Evaluation 09/16/21    Authorization Type UHC Medicare    Progress Note Due on Visit 10    PT Start Time 1452    PT Stop Time 1536    PT Time Calculation (min) 44 min    Activity Tolerance Patient tolerated treatment well;Patient limited by fatigue    Behavior During Therapy [facility] for tasks assessed/performed             Past Medical History:  Diagnosis Date   Allergy    seasonal   Asthma    GERD (gastroesophageal reflux disease)    Hyperlipidemia    Hypertension    Transient cerebral ischemia     Past Surgical History:  Procedure Laterality Date   ABDOMINAL HYSTERECTOMY     for fibroids (no BSO)   BUNIONECTOMY Right    CARPAL TUNNEL RELEASE     Bilaterally   COLONOSCOPY  2011   tiny polyp ; [facility]   Macular Degeneration     Dr Zadie Rhine  ; injections   UMBILICAL HERNIA REPAIR      There were no vitals filed for this visit.   Subjective Assessment - 09/03/21 1454     Subjective Pt reports that she still does not do exercises at home.    Patient Stated Goals Daughter wants pt to get moving some more    Currently in Pain? No/denies                               [facility] Adult PT Treatment/Exercise - 09/03/21 0001       Knee/Hip Exercises: Aerobic   Nustep L5x56min      Knee/Hip Exercises: Standing   Heel Raises Both;15 reps    Heel Raises Limitations UE support    Hip Flexion Both;10 reps;Knee bent;AROM;Stengthening;2 sets    Hip Flexion Limitations UE support, toe taps to 9' step    Hip  Abduction Stengthening;Both;5 reps;Knee straight    Abduction Limitations UE support    Forward Step Up Both;10 reps;Hand Hold: 2;Step Height: 4"    Other Standing Knee Exercises church pews with UE support 10x   cues to decreased ROM to avoid LOB   Other Standing Knee Exercises retro steps with alt feet in front 10x      Knee/Hip Exercises: Seated   Long Arc Quad Strengthening;Both;10 reps;Weights;2 sets    Long Arc Quad Weight 2 lbs.    Clamshell with TheraBand Red   15x3"   Marching Strengthening;Both;10 reps;Weights;2 sets    Marching Weights 2 lbs.    Sit to Sand 10 reps;without UE support   yellow weight ball press                      PT Short Term Goals - 08/31/21 1550       PT SHORT TERM GOAL #1   Title Pt will be independent with HEP    Time 3    Period Weeks  Status On-going   08/31/21 - pt has not attempted HEP at home   Target Date 08/26/21               PT Long Term Goals - 08/31/21 1551       PT LONG TERM GOAL #1   Title Pt will be independent with performing HEP for continued activity at home    Time 6    Period Weeks    Status On-going    Target Date 09/16/21      PT LONG TERM GOAL #2   Title Pt will be able to improve DGI score to at least 20/24 to demo decreased fall risk    Baseline 16/24    Time 6    Period Weeks    Status On-going    Target Date 09/16/21      PT LONG TERM GOAL #3   Title Pt will have improved Berg Balance Score by at least 9 points to demo [place]    Time 6    Period Weeks    Status On-going    Target Date 09/16/21      PT LONG TERM GOAL #4   Title Pt will have improved gait speed by at least 0.05 m/s to demo MDIC    Baseline 0.49 m/s    Time 6    Status On-going    Target Date 09/16/21      PT LONG TERM GOAL #5   Title Pt will demo at least 4-/5 strength in bilat hips    Time 6    Period Weeks    Status On-going    Target Date 09/16/21                   Plan - 09/03/21 1546      Clinical Impression Statement Pt overall becomes limited by fatigue with standing ther ex, only able to complete ~2 standing exercises before requiring seated breaks. Today, needed cuing with most exercises for form, controlled movement, and isometric hold times. She still admits not non-compliance with HEP but I encouraged her to attempt some exercises rather than none.    Personal Factors and Comorbidities Age;Fitness;Time since onset of injury/illness/exacerbation    PT Frequency 2x / week    PT Duration 6 weeks    PT Treatment/Interventions ADLs/Self Care Home Management;Aquatic Therapy;Electrical Stimulation;Iontophoresis 4mg /ml Dexamethasone;Moist Heat;DME Instruction;Cryotherapy;Gait training;Stair training;Functional mobility training;Therapeutic activities;Therapeutic exercise;Balance training;Neuromuscular re-education;Manual techniques;Patient/family education;Orthotic Fit/Training;Dry needling;Passive range of motion;Taping    PT Next Visit Plan Assess response to HEP modifcation and update/progress as indicated. Work on LE strengthening and endurance.    PT Home Exercise Plan Access Code: [email or web address]    Consulted and Agree with Plan of Care Patient;Family member/caregiver    Family Member Consulted Daughter, Juliann Pulse             Patient will benefit from skilled therapeutic intervention in order to improve the following deficits and impairments:  Abnormal gait, Decreased range of motion, Difficulty walking, Decreased endurance, Decreased activity tolerance, Decreased balance, Decreased strength, Decreased mobility, Postural dysfunction  Visit Diagnosis: Muscle weakness (generalized)  Other abnormalities of gait and mobility  Difficulty in walking, not elsewhere classified     Problem List Patient Active Problem List   Diagnosis Date Noted   Hemorrhoid 05/29/2021   Rash 09/18/2020   Loss of weight 02/04/2016   Normocytic anemia 01/30/2016   Routine general medical examination  at a health care facility 12/18/2014   Accelerated hypertension 03/12/2014  GERD 08/29/2008   HYPERLIPIDEMIA 06/14/2007   Asthma with allergic rhinitis without complication 40/69/8614   Allergic rhinitis 11/07/2006   SPINAL STENOSIS 11/07/2006    Artist Pais, PTA 09/03/2021, 3:53 PM  [facility] [address]  [place], [place], [address] Phone: [phone]   Fax:  [phone]  Name: Mackenzee Becvar MRN: [id number] Date of Birth: 09-20-1932

## 2021-09-07 ENCOUNTER — Other Ambulatory Visit: Payer: Self-pay

## 2021-09-07 ENCOUNTER — Encounter: Payer: Self-pay | Admitting: Physical Therapy

## 2021-09-07 ENCOUNTER — Ambulatory Visit: Payer: Medicare Other | Admitting: Physical Therapy

## 2021-09-07 DIAGNOSIS — M6281 Muscle weakness (generalized): Secondary | ICD-10-CM

## 2021-09-07 DIAGNOSIS — R2689 Other abnormalities of gait and mobility: Secondary | ICD-10-CM

## 2021-09-07 DIAGNOSIS — R262 Difficulty in walking, not elsewhere classified: Secondary | ICD-10-CM | POA: Diagnosis not present

## 2021-09-07 NOTE — Therapy (Signed)
[facility] [address]  [place], [place], [address] Phone: [phone]   Fax:  [phone]  Physical Therapy Treatment  Patient Details  Name: Stacy Page MRN: [id number] Date of Birth: Aug 07, 1933 Referring Provider (PT): Rondel Jumbo, [place]   Encounter Date: 09/07/2021   PT End of Session - 09/07/21 1406     Visit Number 6    Number of Visits 12    Date for PT Re-Evaluation 09/16/21    Authorization Type UHC Medicare    Progress Note Due on Visit 10    PT Start Time 1406    PT Stop Time 1447    PT Time Calculation (min) 41 min    Activity Tolerance Patient tolerated treatment well;Patient limited by fatigue    Behavior During Therapy [facility] for tasks assessed/performed             Past Medical History:  Diagnosis Date   Allergy    seasonal   Asthma    GERD (gastroesophageal reflux disease)    Hyperlipidemia    Hypertension    Transient cerebral ischemia     Past Surgical History:  Procedure Laterality Date   ABDOMINAL HYSTERECTOMY     for fibroids (no BSO)   BUNIONECTOMY Right    CARPAL TUNNEL RELEASE     Bilaterally   COLONOSCOPY  2011   tiny polyp ; [facility]   Macular Degeneration     Dr Zadie Rhine  ; injections   UMBILICAL HERNIA REPAIR      There were no vitals filed for this visit.   Subjective Assessment - 09/07/21 1410     Subjective Pt reports she still has not attempted the HEP - states she just doesn't think about them.    Patient Stated Goals Daughter wants pt to get moving some more    Currently in Pain? No/denies                               [facility] Adult PT Treatment/Exercise - 09/07/21 1406       Knee/Hip Exercises: Aerobic   Nustep L6 x 6 min      Knee/Hip Exercises: Standing   Hip Abduction Right;Left;10 reps;Stengthening;Knee straight    Abduction Limitations looped red TB at knees - UE support on back of chair    Hip Extension  Right;Left;10 reps;Stengthening;Knee straight    Extension Limitations looped red TB at knees - UE support on back of chair      Knee/Hip Exercises: Seated   Clamshell with TheraBand Red   15x3"   Marching Both;10 reps;Strengthening    Marching Limitations looped red TB at knees    Sit to Sand 5 reps;without UE support   + red TB hip ABD isometric                Balance Exercises - 09/07/21 1406       Balance Exercises: Standing   Standing Eyes Opened Foam/compliant surface    Standing Eyes Opened Limitations heel-toe weight shift with & w/o UE support    Tandem Stance Eyes open;4 reps;10 secs;Upper extremity support 2;Upper extremity support 1    SLS Eyes open;Solid surface;3 reps;10 secs;Upper extremity support 2;Upper extremity support 1    SLS with Vectors Solid surface;Upper extremity assist 1;5 reps    SLS with Vectors Limitations R/L SLS + 3-way opp LE toe tap to cones  Marching Foam/compliant surface;Upper extremity assist 2;Intermittent upper extremity assist;Static;10 reps   2 sets - with & w/o UE support   Other Standing Exercises Ipsilateral step + contralateral reach to cone atop 36" FR x 10   repeated cues for pattern                 PT Short Term Goals - 09/07/21 1413       PT SHORT TERM GOAL #1   Title Pt will be independent with HEP    Time 3    Period Weeks    Status On-going   09/07/21 - pt still has not attempted HEP at home   Target Date 08/26/21               PT Long Term Goals - 08/31/21 1551       PT LONG TERM GOAL #1   Title Pt will be independent with performing HEP for continued activity at home    Time 6    Period Weeks    Status On-going    Target Date 09/16/21      PT LONG TERM GOAL #2   Title Pt will be able to improve DGI score to at least 20/24 to demo decreased fall risk    Baseline 16/24    Time 6    Period Weeks    Status On-going    Target Date 09/16/21      PT LONG TERM GOAL #3   Title Pt will have  improved Berg Balance Score by at least 9 points to demo [place]    Time 6    Period Weeks    Status On-going    Target Date 09/16/21      PT LONG TERM GOAL #4   Title Pt will have improved gait speed by at least 0.05 m/s to demo MDIC    Baseline 0.49 m/s    Time 6    Status On-going    Target Date 09/16/21      PT LONG TERM GOAL #5   Title Pt will demo at least 4-/5 strength in bilat hips    Time 6    Period Weeks    Status On-going    Target Date 09/16/21                   Plan - 09/07/21 1414     Clinical Impression Statement [name] still has not attempted HEP, stating she just doesnt think of them at home - encouraged pt to try to complete HEP even in small groups of exercises - pt verbalizing intent to do some at home and her daughter is aware of need for more consistent HEP performance at home. Continued strengthening TE and progressed static and dynamic balance activities with progressively lessening UE support. Pt having difficulty at times coordinating desired movement patterns and requiring intermittent seated rest breaks but no instability of LOB observed.    Rehab Potential Good    PT Frequency 2x / week    PT Duration 6 weeks    PT Treatment/Interventions ADLs/Self Care Home Management;Aquatic Therapy;Electrical Stimulation;Iontophoresis 4mg /ml Dexamethasone;Moist Heat;DME Instruction;Cryotherapy;Gait training;Stair training;Functional mobility training;Therapeutic activities;Therapeutic exercise;Balance training;Neuromuscular re-education;Manual techniques;Patient/family education;Orthotic Fit/Training;Dry needling;Passive range of motion;Taping    PT Next Visit Plan Assess response to HEP modifcation and update/progress as indicated. Work on LE strengthening and endurance.    PT Home Exercise Plan Access Code: [email or web address]    Consulted and Agree with Plan of Care Patient;Family member/caregiver  Family Member Consulted Daughter, Juliann Pulse              Patient will benefit from skilled therapeutic intervention in order to improve the following deficits and impairments:  Abnormal gait, Decreased range of motion, Difficulty walking, Decreased endurance, Decreased activity tolerance, Decreased balance, Decreased strength, Decreased mobility, Postural dysfunction  Visit Diagnosis: Muscle weakness (generalized)  Other abnormalities of gait and mobility  Difficulty in walking, not elsewhere classified     Problem List Patient Active Problem List   Diagnosis Date Noted   Hemorrhoid 05/29/2021   Rash 09/18/2020   Loss of weight 02/04/2016   Normocytic anemia 01/30/2016   Routine general medical examination at a health care facility 12/18/2014   Accelerated hypertension 03/12/2014   GERD 08/29/2008   HYPERLIPIDEMIA 06/14/2007   Asthma with allergic rhinitis without complication 62/26/3335   Allergic rhinitis 11/07/2006   SPINAL STENOSIS 11/07/2006    Percival Spanish, PT 09/07/2021, 4:05 PM  [facility] [address]  [place], [place], [address] Phone: [phone]   Fax:  [phone]  Name: Jakiyah Stepney MRN: [id number] Date of Birth: 21-Feb-1933

## 2021-09-10 ENCOUNTER — Other Ambulatory Visit: Payer: Self-pay

## 2021-09-10 ENCOUNTER — Ambulatory Visit: Payer: Medicare Other

## 2021-09-10 DIAGNOSIS — R262 Difficulty in walking, not elsewhere classified: Secondary | ICD-10-CM | POA: Diagnosis not present

## 2021-09-10 DIAGNOSIS — R2689 Other abnormalities of gait and mobility: Secondary | ICD-10-CM

## 2021-09-10 DIAGNOSIS — M6281 Muscle weakness (generalized): Secondary | ICD-10-CM

## 2021-09-10 NOTE — Therapy (Signed)
[facility] [address]  [place], [place], [address] Phone: [phone]   Fax:  [phone]  Physical Therapy Treatment  Patient Details  Name: Stacy Page MRN: [id number] Date of Birth: [birth year] Referring Provider (PT): Rondel Jumbo, [place]   Encounter Date: 09/10/2021   PT End of Session - 09/10/21 1448     Visit Number 7    Number of Visits 12    Date for PT Re-Evaluation 09/16/21    Authorization Type UHC Medicare    Progress Note Due on Visit 10    PT Start Time 1404    PT Stop Time 1444    PT Time Calculation (min) 40 min    Activity Tolerance Patient tolerated treatment well;Patient limited by fatigue    Behavior During Therapy [facility] for tasks assessed/performed             Past Medical History:  Diagnosis Date   Allergy    seasonal   Asthma    GERD (gastroesophageal reflux disease)    Hyperlipidemia    Hypertension    Transient cerebral ischemia     Past Surgical History:  Procedure Laterality Date   ABDOMINAL HYSTERECTOMY     for fibroids (no BSO)   BUNIONECTOMY Right    CARPAL TUNNEL RELEASE     Bilaterally   COLONOSCOPY  2011   tiny polyp ; [facility]   Macular Degeneration     Dr Zadie Rhine  ; injections   UMBILICAL HERNIA REPAIR      There were no vitals filed for this visit.   Subjective Assessment - 09/10/21 1408     Subjective Pt reports that she still just has some soreness.    Patient Stated Goals Daughter wants pt to get moving some more    Currently in Pain? No/denies                [facility] PT Assessment - 09/10/21 0001       Strength   Right Hip Flexion 4-/5    Right Hip ABduction 3+/5    Right Hip ADduction 3+/5    Left Hip Flexion 4-/5    Left Hip ABduction 3+/5    Left Hip ADduction 3+/5    Right Knee Flexion 4/5    Right Knee Extension 4-/5    Left Knee Flexion 4/5    Left Knee Extension 4-/5                           [facility]  Adult PT Treatment/Exercise - 09/10/21 0001       Knee/Hip Exercises: Aerobic   Nustep L6 x 6 min      Knee/Hip Exercises: Standing   Hip Abduction Right;Left;10 reps;Stengthening;Knee straight    Abduction Limitations looped red TB at knees - UE support    Hip Extension Right;Left;10 reps;Stengthening;Knee straight    Extension Limitations looped red TB at knees - UE support with arm raises    Lateral Step Up Both;5 reps;Hand Hold: 1;Step Height: 4"    Forward Step Up Both;10 reps;Hand Hold: 2;Step Height: 4"      Knee/Hip Exercises: Seated   Long Arc Quad Strengthening;Both;10 reps    Long Arc Quad Weight 2 lbs.    Long [employer] Limitations with ball squeeze    Ball Squeeze 10x5"    Clamshell with TheraBand Red   single leg 15x3"   Sit to [employer] 10 reps  PT Short Term Goals - 09/07/21 1413       PT SHORT TERM GOAL #1   Title Pt will be independent with HEP    Time 3    Period Weeks    Status On-going   09/07/21 - pt still has not attempted HEP at home   Target Date 08/26/21               PT Long Term Goals - 09/10/21 1419       PT LONG TERM GOAL #1   Title Pt will be independent with performing HEP for continued activity at home    Time 6    Period Weeks    Status On-going    Target Date 09/16/21      PT LONG TERM GOAL #2   Title Pt will be able to improve DGI score to at least 20/24 to demo decreased fall risk    Baseline 16/24    Time 6    Period Weeks    Status On-going    Target Date 09/16/21      PT LONG TERM GOAL #3   Title Pt will have improved Berg Balance Score by at least 9 points to demo [place]    Time 6    Period Weeks    Status On-going    Target Date 09/16/21      PT LONG TERM GOAL #4   Title Pt will have improved gait speed by at least 0.05 m/s to demo MDIC    Baseline 0.49 m/s    Time 6    Status On-going    Target Date 09/16/21      PT LONG TERM GOAL #5   Title Pt will demo at least 4-/5 strength  in bilat hips    Time 6    Period Weeks    Status On-going   improved, still limited in medio-lateral hips   Target Date 09/16/21                   Plan - 09/10/21 1449     Clinical Impression Statement Pt shows improvements in LE strength, since starting PT. Still becomes fatigued quickly with more dynamic exercises. Inorporated exercises to decrease UE support to challenge balance. Once again, she has not done HEP but today she voiced that she would try doing some of the exercises from today at home. Progress is being made towards her LTG #5.    Personal Factors and Comorbidities Age;Fitness;Time since onset of injury/illness/exacerbation    PT Frequency 2x / week    PT Duration 6 weeks    PT Treatment/Interventions ADLs/Self Care Home Management;Aquatic Therapy;Electrical Stimulation;Iontophoresis 4mg /ml Dexamethasone;Moist Heat;DME Instruction;Cryotherapy;Gait training;Stair training;Functional mobility training;Therapeutic activities;Therapeutic exercise;Balance training;Neuromuscular re-education;Manual techniques;Patient/family education;Orthotic Fit/Training;Dry needling;Passive range of motion;Taping    PT Next Visit Plan Assess response to HEP modifcation and update/progress as indicated. Work on LE strengthening and endurance.    PT Home Exercise Plan Access Code: [email or web address]    Consulted and Agree with Plan of Care Patient;Family member/caregiver    Family Member Consulted Daughter, Juliann Pulse             Patient will benefit from skilled therapeutic intervention in order to improve the following deficits and impairments:  Abnormal gait, Decreased range of motion, Difficulty walking, Decreased endurance, Decreased activity tolerance, Decreased balance, Decreased strength, Decreased mobility, Postural dysfunction  Visit Diagnosis: Muscle weakness (generalized)  Other abnormalities of gait and mobility  Difficulty in walking, not elsewhere classified  Problem  List Patient Active Problem List   Diagnosis Date Noted   Hemorrhoid 05/29/2021   Rash 09/18/2020   Loss of weight 02/04/2016   Normocytic anemia 01/30/2016   Routine general medical examination at a health care facility 12/18/2014   Accelerated hypertension 03/12/2014   GERD 08/29/2008   HYPERLIPIDEMIA 06/14/2007   Asthma with allergic rhinitis without complication 40/35/2481   Allergic rhinitis 11/07/2006   SPINAL STENOSIS 11/07/2006    Artist Pais, PTA 09/10/2021, 5:07 PM  [facility] [address]  [place], [place], [address] Phone: [phone]   Fax:  [phone]  Name: Stacy Page MRN: [id number] Date of Birth: 1933-07-09

## 2021-09-14 ENCOUNTER — Ambulatory Visit: Payer: Medicare Other | Admitting: Physical Therapy

## 2021-09-14 ENCOUNTER — Other Ambulatory Visit: Payer: Self-pay

## 2021-09-14 ENCOUNTER — Encounter: Payer: Self-pay | Admitting: Physical Therapy

## 2021-09-14 DIAGNOSIS — R2689 Other abnormalities of gait and mobility: Secondary | ICD-10-CM

## 2021-09-14 DIAGNOSIS — M6281 Muscle weakness (generalized): Secondary | ICD-10-CM | POA: Diagnosis not present

## 2021-09-14 DIAGNOSIS — R262 Difficulty in walking, not elsewhere classified: Secondary | ICD-10-CM | POA: Diagnosis not present

## 2021-09-14 NOTE — Therapy (Signed)
[facility] [address]  [place], [place], [address] Phone: [phone]   Fax:  [phone]  Physical Therapy Treatment / Recert  Patient Details  Name: Stacy Page MRN: [id number] Date of Birth: 1933-05-16 Referring Provider (PT): Rondel Jumbo, PA-C  Progress Note  Reporting Period 08/05/2021 to 09/14/2021  See note below for Objective Data and Assessment of Progress/Goals.     Encounter Date: 09/14/2021   PT End of Session - 09/14/21 1406     Visit Number 8    Number of Visits 16    Date for PT Re-Evaluation 10/12/21    Authorization Type [employer] Medicare    Progress Note Due on Visit 16   Recert on visit #8 - 2/63/78   PT Start Time 1406   Pt arrived late   PT Stop Time 1448    PT Time Calculation (min) 42 min    Activity Tolerance Patient tolerated treatment well    Behavior During Therapy WFL for tasks assessed/performed             Past Medical History:  Diagnosis Date   Allergy    seasonal   Asthma    GERD (gastroesophageal reflux disease)    Hyperlipidemia    Hypertension    Transient cerebral ischemia     Past Surgical History:  Procedure Laterality Date   ABDOMINAL HYSTERECTOMY     for fibroids (no BSO)   BUNIONECTOMY Right    CARPAL TUNNEL RELEASE     Bilaterally   COLONOSCOPY  2011   tiny polyp ; [facility]   Macular Degeneration     Dr Zadie Rhine  ; injections   UMBILICAL HERNIA REPAIR      There were no vitals filed for this visit.   Subjective Assessment - 09/14/21 1410     Subjective Pt reports soreness around her knees (L>R) but otherwise denies any pain.    Patient is accompained by: Family member   Daughter - [name]   Patient Stated Goals Daughter wants pt to get moving some more    Currently in Pain? Yes    Pain Score 5     Pain Location Knee    Pain Orientation Left;Right   L>R   Pain Descriptors / Indicators Sore    Pain Type Acute pain    Pain Frequency  Intermittent                [facility] PT Assessment - 09/14/21 1406       Assessment   Medical Diagnosis Gait instability    Referring Provider (PT) Rondel Jumbo, PA-C    Next MD Visit 11/17/21      Prior Function   Level of Independence Independent with basic ADLs    Leisure Daughter states pt used to like to walk but she walked without telling anyone and lost her way      Strength   Right Hip Flexion 4-/5    Right Hip ABduction 3+/5    Right Hip ADduction 3+/5    Left Hip Flexion 4-/5    Left Hip ABduction 3+/5    Left Hip ADduction 3+/5    Right Knee Flexion 4/5    Right Knee Extension 4-/5    Left Knee Flexion 4/5    Left Knee Extension 4-/5      Ambulation/Gait   Assistive device Straight cane;None    Gait Pattern Step-through pattern;Decreased stride length;Decreased hip/knee flexion - right;Decreased hip/knee flexion -  left;Poor foot clearance - left;Poor foot clearance - right    Ambulation Surface Level;Indoor    Gait velocity 0.63 m/sec with SPC; 0.75 m/sec w/o AD      Standardized Balance Assessment   Standardized Balance Assessment Berg Balance Test;Dynamic Gait Index;10 meter walk test    10 Meter Walk 15.81 sec with SPC; 13.41 sec w/o AD      Berg Balance Test   Sit to Stand Able to stand without using hands and stabilize independently    Standing Unsupported Able to stand safely 2 minutes    Sitting with Back Unsupported but Feet Supported on Floor or Stool Able to sit safely and securely 2 minutes    Stand to Sit Sits safely with minimal use of hands    Transfers Able to transfer safely, minor use of hands    Standing Unsupported with Eyes Closed Able to stand 10 seconds safely    Standing Unsupported with Feet Together Able to place feet together independently and stand 1 minute safely    From Standing, Reach Forward with Outstretched Arm Can reach confidently >25 cm (10")    From Standing Position, Pick up Object from Floor Able to pick up shoe,  needs supervision    From Standing Position, Turn to Look Behind Over each Shoulder Looks behind one side only/other side shows less weight shift    Turn 360 Degrees Able to turn 360 degrees safely in 4 seconds or less    Standing Unsupported, Alternately Place Feet on Step/Stool Able to stand independently and complete 8 steps >20 seconds    Standing Unsupported, One Foot in Front Able to take small step independently and hold 30 seconds    Standing on One Leg Able to lift leg independently and hold equal to or more than 3 seconds   L SLS only   Total Score 49    Berg comment: 46-51 moderate fall risk (>50%)      Dynamic Gait Index   Level Surface Normal    Change in Gait Speed Normal    Gait with Horizontal Head Turns Mild Impairment    Gait with Vertical Head Turns Mild Impairment    Gait and Pivot Turn Normal    Step Over Obstacle Mild Impairment    Step Around Obstacles Normal    Steps Moderate Impairment    Total Score 19    DGI comment: Scores of 19 or less are predictive of falls in older community living adults                           [facility] Adult PT Treatment/Exercise - 09/14/21 1406       Knee/Hip Exercises: Aerobic   Nustep L6 x 6 min (UE/LE)                     PT Education - 09/14/21 1442     Education Details Results of reassessment and standardized testing, plan for recert, need for better compliance with HEP    Person(s) Educated Patient;Child(ren)    Methods Explanation    Comprehension Verbalized understanding              PT Short Term Goals - 09/14/21 1413       PT SHORT TERM GOAL #1   Title Pt will be independent with HEP    Status On-going   09/14/21 - pt still has not attempted HEP at home   Target Date  08/26/21               PT Long Term Goals - 09/14/21 1414       PT LONG TERM GOAL #1   Title Pt will be independent with performing HEP for continued activity at home    Status On-going    Target Date  10/12/21      PT LONG TERM GOAL #2   Title Pt will be able to improve DGI score to at least 20/24 to demo decreased fall risk    Baseline 16/24    Status Partially Met   09/14/21 - DGI improved to 19/24   Target Date 10/12/21      PT LONG TERM GOAL #3   Title Pt will have improved Berg Balance Score by at least 9 points to demo [place]    Baseline 46/56    Status Partially Met   09/14/21 - Berg improved to 49/56   Target Date 10/12/21      PT LONG TERM GOAL #4   Title Pt will have improved gait speed by at least 0.05 m/s to demo MDIC    Baseline 0.49 m/s    Status Achieved   09/14/21 - gait speed improved to 0.63 m/sec with SPC and 0.75 m/sec w/o AD   Target Date 09/16/21      PT LONG TERM GOAL #5   Title Pt will demo at least 4-/5 strength in bilat hips    Status Partially Met   09/14/21 - improved, still limited in medial & lateral hips   Target Date 10/12/21                   Plan - 09/14/21 1415     Clinical Impression Statement Shreshta has demonstrated good initial progress with PT with gains seen in LE strength as well as on all standardized balance testing. Average LE MMT grade improved by at least 1 level but continued proximal > distal LE weakness still evident. Gait speed improved from 0.49 m/s to 0.63 m/s with SPC and 0.75 m/s w/o AD (LTG# 4 met). Berg improved from 46/56 to 49/56 and DGI improved from 16/24 to 19/24, both indicating decreasing risk for falls but not quite to goal levels. She continues to report noncompliance with HEP despite repeated instruction and encouragement, hence HEP STG and LTG also still ongoing. Pts dtr wishing to have pt continue with PT to address remaining strength and balance deficits and patient in agreement - discussed plan to recommend recert for additional month but reinforced need for better compliance with HEP aiming for at least 3x/wk on non-therapy days to ensure carryover of therapeutic activities and prevent future decline upon  completion of PT.    Personal Factors and Comorbidities Age;Fitness;Time since onset of injury/illness/exacerbation    Examination-Activity Limitations Locomotion Level;Squat;Stairs    Examination-Participation Restrictions Community Activity;Shop    Rehab Potential Good    PT Frequency 2x / week    PT Duration 4 weeks    PT Treatment/Interventions ADLs/Self Care Home Management;Aquatic Therapy;Electrical Stimulation;Iontophoresis 51m/ml Dexamethasone;Moist Heat;DME Instruction;Cryotherapy;Gait training;Stair training;Functional mobility training;Therapeutic activities;Therapeutic exercise;Balance training;Neuromuscular re-education;Manual techniques;Patient/family education;Orthotic Fit/Training;Dry needling;Passive range of motion;Taping    PT Next Visit Plan Assess complaince with HEP and update/modify/progress as indicated. Work on [employer] endurance and balance.    PT Home Exercise Plan Access Code: [email or web address]    Consulted and Agree with Plan of Care Patient;Family member/caregiver    Family Member Consulted Daughter - CTye [place]  Patient will benefit from skilled therapeutic intervention in order to improve the following deficits and impairments:  Abnormal gait, Decreased range of motion, Difficulty walking, Decreased endurance, Decreased activity tolerance, Decreased balance, Decreased strength, Decreased mobility, Postural dysfunction  Visit Diagnosis: Muscle weakness (generalized) - Plan: PT plan of care cert/re-cert  Other abnormalities of gait and mobility - Plan: PT plan of care cert/re-cert  Difficulty in walking, not elsewhere classified - Plan: PT plan of care cert/re-cert     Problem List Patient Active Problem List   Diagnosis Date Noted   Hemorrhoid 05/29/2021   Rash 09/18/2020   Loss of weight 02/04/2016   Normocytic anemia 01/30/2016   Routine general medical examination at a health care facility 12/18/2014   Accelerated hypertension 03/12/2014    GERD 08/29/2008   HYPERLIPIDEMIA 06/14/2007   Asthma with allergic rhinitis without complication 89/78/4784   Allergic rhinitis 11/07/2006   SPINAL STENOSIS 11/07/2006    Percival Spanish, PT 09/14/2021, 6:37 PM  [facility] [address]  [place], [place], [address] Phone: [phone]   Fax:  [phone]  Name: Stacy Page MRN: [id number] Date of Birth: 11/05/1932

## 2021-09-17 ENCOUNTER — Ambulatory Visit: Payer: Medicare Other

## 2021-09-17 ENCOUNTER — Other Ambulatory Visit: Payer: Self-pay

## 2021-09-17 DIAGNOSIS — R2689 Other abnormalities of gait and mobility: Secondary | ICD-10-CM

## 2021-09-17 DIAGNOSIS — R262 Difficulty in walking, not elsewhere classified: Secondary | ICD-10-CM | POA: Diagnosis not present

## 2021-09-17 DIAGNOSIS — M6281 Muscle weakness (generalized): Secondary | ICD-10-CM | POA: Diagnosis not present

## 2021-09-17 NOTE — Therapy (Signed)
[facility] °[facility] °[address]  [address] °[place], [place], [address] °Phone: [phone]   Fax:  [phone] ° °Physical Therapy Treatment ° °Patient Details  °Name: Stacy Page °MRN: [id number] °Date of Birth: 03/28/1933 °Referring Provider (PT): Sara E Wertman, PA-C ° ° °Encounter Date: 09/17/2021 ° ° PT End of Session - 09/17/21 1617   ° ° Visit Number 9   ° Number of Visits 16   ° Date for PT Re-Evaluation 10/12/21   ° Authorization Type [employer] Medicare   ° Progress Note Due on Visit 16   Recert on visit #8 - 09/14/21  ° PT Start Time 1532   ° PT Stop Time 1616   ° PT Time Calculation (min) 44 min   ° Activity Tolerance Patient tolerated treatment well;Patient limited by fatigue   ° Behavior During Therapy WFL for tasks assessed/performed   ° °  °  ° °  ° ° °Past Medical History:  °Diagnosis Date  ° Allergy   ° seasonal  ° Asthma   ° GERD (gastroesophageal reflux disease)   ° Hyperlipidemia   ° Hypertension   ° Transient cerebral ischemia   ° ° °Past Surgical History:  °Procedure Laterality Date  ° ABDOMINAL HYSTERECTOMY    ° for fibroids (no BSO)  ° BUNIONECTOMY Right   ° CARPAL TUNNEL RELEASE    ° Bilaterally  ° COLONOSCOPY  2011  ° tiny polyp ; [facility]  ° Macular Degeneration    ° Dr Rankin  ; injections  ° UMBILICAL HERNIA REPAIR    ° ° °There were no vitals filed for this visit. ° ° Subjective Assessment - 09/17/21 1533   ° ° Subjective Pt reports that she still has not tried any of her home exercises.   ° Patient is accompained by: Family member   ° Patient Stated Goals Daughter wants pt to get moving some more   ° Currently in Pain? No/denies   ° °  °  ° °  ° ° ° ° ° ° ° ° ° ° ° ° ° ° ° ° ° ° ° ° [facility] Adult PT Treatment/Exercise - 09/17/21 0001   ° °  ° Ambulation/Gait  ° Ambulation Distance (Feet) 320 Feet   ° Assistive device Straight cane   ° Gait Pattern Step-through pattern;Decreased stride length;Decreased hip/knee flexion - right;Decreased hip/knee  flexion - left;Poor foot clearance - left;Poor foot clearance - right   ° Gait Comments cues to slow down pace of gait to avoid falls, instruction given to try increase walking distance at home   °  ° Knee/Hip Exercises: Aerobic  ° Nustep L5 x 8 min (UE/LE)   °  ° Knee/Hip Exercises: Standing  ° Lateral Step Up Both;2 sets;10 reps;Hand Hold: 1;Step Height: 4"   ° Functional Squat 15 reps   ° Functional Squat Limitations feedback for proper post WS and form   ° Other Standing Knee Exercises fwd steps with arm raise and WS 10x each side   °  ° Knee/Hip Exercises: Seated  ° Ball Squeeze 13x5"   ° Hamstring Curl Strengthening;Both;15 reps   ° Hamstring Limitations red TB   ° °  °  ° °  ° ° ° ° ° ° ° ° ° ° ° ° PT Short Term Goals - 09/14/21 1413   ° °  ° PT SHORT TERM GOAL #1  ° Title Pt will be independent with HEP   ° Status On-going     09/14/21 - pt still has not attempted HEP at home   Target Date 08/26/21               PT Long Term Goals - 09/14/21 1414       PT LONG TERM GOAL #1   Title Pt will be independent with performing HEP for continued activity at home    Status On-going    Target Date 10/12/21      PT LONG TERM GOAL #2   Title Pt will be able to improve DGI score to at least 20/24 to demo decreased fall risk    Baseline 16/24    Status Partially Met   09/14/21 - DGI improved to 19/24   Target Date 10/12/21      PT LONG TERM GOAL #3   Title Pt will have improved Berg Balance Score by at least 9 points to demo [place]    Baseline 46/56    Status Partially Met   09/14/21 - Berg improved to 49/56   Target Date 10/12/21      PT LONG TERM GOAL #4   Title Pt will have improved gait speed by at least 0.05 m/s to demo MDIC    Baseline 0.49 m/s    Status Achieved   09/14/21 - gait speed improved to 0.63 m/sec with SPC and 0.75 m/sec w/o AD   Target Date 09/16/21      PT LONG TERM GOAL #5   Title Pt will demo at least 4-/5 strength in bilat hips    Status Partially Met   09/14/21 -  improved, still limited in medial & lateral hips   Target Date 10/12/21                   Plan - 09/17/21 1755     Clinical Impression Statement Pt continues with poor HEP compliance which is limiting her progress in PT. She was encouraged to start walking more around her house if nothing else to keep moving if she does not start doing her HEP. She was more fatigued today but we still progressed WB ther ex to improve function. Cues given as needed to correct form and to prevent compensations. Also progressed gait training to improve endurance and provided cues at times for cane squencing. Pt would continue to benefit from WB exercises and balance to improve stability.    Personal Factors and Comorbidities Age;Fitness;Time since onset of injury/illness/exacerbation    PT Frequency 2x / week    PT Duration 4 weeks    PT Treatment/Interventions ADLs/Self Care Home Management;Aquatic Therapy;Electrical Stimulation;Iontophoresis 81m/ml Dexamethasone;Moist Heat;DME Instruction;Cryotherapy;Gait training;Stair training;Functional mobility training;Therapeutic activities;Therapeutic exercise;Balance training;Neuromuscular re-education;Manual techniques;Patient/family education;Orthotic Fit/Training;Dry needling;Passive range of motion;Taping    PT Next Visit Plan Assess complaince with HEP and update/modify/progress as indicated. Work on [employer] endurance and balance.    PT Home Exercise Plan Access Code: [email or web address]    Consulted and Agree with Plan of Care Patient;Family member/caregiver    Family Member Consulted Daughter - CTye [place]            Patient will benefit from skilled therapeutic intervention in order to improve the following deficits and impairments:  Abnormal gait, Decreased range of motion, Difficulty walking, Decreased endurance, Decreased activity tolerance, Decreased balance, Decreased strength, Decreased mobility, Postural dysfunction  Visit Diagnosis: Muscle weakness  (generalized)  Other abnormalities of gait and mobility  Difficulty in walking, not elsewhere classified     Problem List Patient Active Problem List   Diagnosis  Date Noted  ° Hemorrhoid 05/29/2021  ° Rash 09/18/2020  ° Loss of weight 02/04/2016  ° Normocytic anemia 01/30/2016  ° Routine general medical examination at a health care facility 12/18/2014  ° Accelerated hypertension 03/12/2014  ° GERD 08/29/2008  ° HYPERLIPIDEMIA 06/14/2007  ° Asthma with allergic rhinitis without complication 06/14/2007  ° Allergic rhinitis 11/07/2006  ° SPINAL STENOSIS 11/07/2006  ° ° ° L , PTA °09/17/2021, 6:00 PM ° °[facility] °[facility] °[address]  [address] °[place], [place], [address] °Phone: [phone]   Fax:  [phone] ° °Name: Ariday Gwilliam °MRN: [id number] °Date of Birth: 04/08/1933 ° ° ° °

## 2021-09-21 ENCOUNTER — Ambulatory Visit: Payer: Medicare Other | Admitting: Physical Therapy

## 2021-09-23 ENCOUNTER — Ambulatory Visit: Payer: Medicare Other

## 2021-09-28 ENCOUNTER — Other Ambulatory Visit: Payer: Self-pay

## 2021-09-28 ENCOUNTER — Encounter: Payer: Self-pay | Admitting: Physical Therapy

## 2021-09-28 ENCOUNTER — Ambulatory Visit: Payer: Medicare Other | Attending: Internal Medicine | Admitting: Physical Therapy

## 2021-09-28 DIAGNOSIS — R2689 Other abnormalities of gait and mobility: Secondary | ICD-10-CM | POA: Insufficient documentation

## 2021-09-28 DIAGNOSIS — R262 Difficulty in walking, not elsewhere classified: Secondary | ICD-10-CM | POA: Diagnosis not present

## 2021-09-28 DIAGNOSIS — M6281 Muscle weakness (generalized): Secondary | ICD-10-CM | POA: Insufficient documentation

## 2021-09-28 NOTE — Therapy (Signed)
[facility] [address]  [place], [place], [address] Phone: [phone]   Fax:  [phone]  Physical Therapy Treatment  Patient Details  Name: Stacy Page MRN: [id number] Date of Birth: 1933-03-24 Referring Provider (PT): Rondel Jumbo, PA-C   Encounter Date: 09/28/2021   PT End of Session - 09/28/21 1403     Visit Number 10    Number of Visits 16    Date for PT Re-Evaluation 10/12/21    Authorization Type UHC Medicare    Progress Note Due on Visit 16   Recert on visit #8 - 3/61/44   PT Start Time 3154    PT Stop Time 1445    PT Time Calculation (min) 42 min    Activity Tolerance Patient tolerated treatment well    Behavior During Therapy [facility] for tasks assessed/performed             Past Medical History:  Diagnosis Date   Allergy    seasonal   Asthma    GERD (gastroesophageal reflux disease)    Hyperlipidemia    Hypertension    Transient cerebral ischemia     Past Surgical History:  Procedure Laterality Date   ABDOMINAL HYSTERECTOMY     for fibroids (no BSO)   BUNIONECTOMY Right    CARPAL TUNNEL RELEASE     Bilaterally   COLONOSCOPY  2011   tiny polyp ; [facility]   Macular Degeneration     Dr Zadie Rhine  ; injections   UMBILICAL HERNIA REPAIR      There were no vitals filed for this visit.   Subjective Assessment - 09/28/21 1406     Subjective Pt reports increased pain in her legs the night before last. She still has not tried any of her home exercises.    Patient is accompained by: Family member    Patient Stated Goals Daughter wants pt to get moving some more    Currently in Pain? No/denies                               [facility] Adult PT Treatment/Exercise - 09/28/21 1403       Knee/Hip Exercises: Aerobic   Nustep L5 x 6 min (UE/LE)                 Balance Exercises - 09/28/21 1403       OT[place]O PROGRAM   Head Movements Standing;5 reps    Neck  Movements Standing;5 reps    Back Extension Standing;5 reps    Trunk Movements Standing;5 reps    Ankle Movements Sitting;10 reps    Knee Extensor 10 reps    Knee Flexor 10 reps    Hip ABductor 10 reps    Ankle Plantorflexors --   10 reps, support   Ankle Dorsiflexors --   10 reps, support   Knee Bends 10 reps, support    Backwards Walking Support    Walking and Turning Around No assistive device    Sideways Walking Assistive device   counter support   Tandem Stance 10 seconds, support    Tandem Walk Support    One Leg Stand 10 seconds, support    Heel Walking Support    Toe Walk Support    Sit to Stand 10 reps, no support                  PT Short  Term Goals - 09/14/21 1413   ° °  ° PT SHORT TERM GOAL #1  ° Title Pt will be independent with HEP   ° Status On-going   09/14/21 - pt still has not attempted HEP at home  ° Target Date 08/26/21   ° °  °  ° °  ° ° ° ° PT Long Term Goals - 09/14/21 1414   ° °  ° PT LONG TERM GOAL #1  ° Title Pt will be independent with performing HEP for continued activity at home   ° Status On-going   ° Target Date 10/12/21   °  ° PT LONG TERM GOAL #2  ° Title Pt will be able to improve DGI score to at least 20/24 to demo decreased fall risk   ° Baseline 16/24   ° Status Partially Met   09/14/21 - DGI improved to 19/24  ° Target Date 10/12/21   °  ° PT LONG TERM GOAL #3  ° Title Pt will have improved Berg Balance Score by at least 9 points to demo [place]DC   ° Baseline 46/56   ° Status Partially Met   09/14/21 - Berg improved to 49/56  ° Target Date 10/12/21   °  ° PT LONG TERM GOAL #4  ° Title Pt will have improved gait speed by at least 0.05 m/s to demo MDIC   ° Baseline 0.49 m/s   ° Status Achieved   09/14/21 - gait speed improved to 0.63 m/sec with SPC and 0.75 m/sec w/o AD  ° Target Date 09/16/21   °  ° PT LONG TERM GOAL #5  ° Title Pt will demo at least 4-/5 strength in bilat hips   ° Status Partially Met   09/14/21 - improved, still limited in medial & lateral  hips  ° Target Date 10/12/21   ° °  °  ° °  ° ° ° ° ° ° ° ° Plan - 09/28/21 1445   ° ° Clinical Impression Statement Stacy Page has yet to attempt her HEP despite repeated encouragement from PT/PTA. Tried shifting focus of HEP to [place] Fall Prevention Program with expectation of 3x/wk and instructions to pt and dtr to keep [place] booklet on kitchen counter to work on a few exercises at a time whenever she goes into the kitchen. Highlighted exercises that pt is to attempt at home (all at supported level) with instructions to work through booklet in order, taking breaks as needed but picking up where she left off on the next attempt.   ° Rehab Potential Good   ° PT Frequency 2x / week   ° PT Duration 4 weeks   ° PT Treatment/Interventions ADLs/Self Care Home Management;Aquatic Therapy;Electrical Stimulation;Iontophoresis 4mg/ml Dexamethasone;Moist Heat;DME Instruction;Cryotherapy;Gait training;Stair training;Functional mobility training;Therapeutic activities;Therapeutic exercise;Balance training;Neuromuscular re-education;Manual techniques;Patient/family education;Orthotic Fit/Training;Dry needling;Passive range of motion;Taping   ° PT Next Visit Plan Assess complaince with HEP and update/modify/progress as indicated. Work on LE strengthening, endurance and balance.   ° PT Home Exercise Plan Access Code: [email or web address] (12/14, modified 1/9); [place] Fall Prevention Program (2/6)   ° Consulted and Agree with Plan of Care Patient;Family member/caregiver   ° Family Member Consulted Daughter - [name]   ° °  °  ° °  ° ° °Patient will benefit from skilled therapeutic intervention in order to improve the following deficits and impairments:  Abnormal gait, Decreased range of motion, Difficulty walking, Decreased endurance, Decreased activity tolerance, Decreased balance, Decreased strength, Decreased mobility, Postural dysfunction ° °  Visit Diagnosis: Muscle weakness (generalized)  Other abnormalities of gait and mobility  Difficulty  in walking, not elsewhere classified     Problem List Patient Active Problem List   Diagnosis Date Noted   Hemorrhoid 05/29/2021   Rash 09/18/2020   Loss of weight 02/04/2016   Normocytic anemia 01/30/2016   Routine general medical examination at a health care facility 12/18/2014   Accelerated hypertension 03/12/2014   GERD 08/29/2008   HYPERLIPIDEMIA 06/14/2007   Asthma with allergic rhinitis without complication 37/11/8887   Allergic rhinitis 11/07/2006   SPINAL STENOSIS 11/07/2006    Percival Spanish, PT 09/28/2021, 3:46 PM  [facility] [address]  [place], [place], [address] Phone: [phone]   Fax:  [phone]  Name: Stacy Page MRN: [id number] Date of Birth: [birth year]

## 2021-09-30 ENCOUNTER — Other Ambulatory Visit: Payer: Self-pay

## 2021-09-30 ENCOUNTER — Ambulatory Visit: Payer: Medicare Other

## 2021-09-30 DIAGNOSIS — R2689 Other abnormalities of gait and mobility: Secondary | ICD-10-CM | POA: Diagnosis not present

## 2021-09-30 DIAGNOSIS — R262 Difficulty in walking, not elsewhere classified: Secondary | ICD-10-CM

## 2021-09-30 DIAGNOSIS — M6281 Muscle weakness (generalized): Secondary | ICD-10-CM

## 2021-09-30 NOTE — Therapy (Signed)
[facility] [address]  [place], [place], [address] Phone: [phone]   Fax:  [phone]  Physical Therapy Treatment  Patient Details  Name: Stacy Page MRN: [id number] Date of Birth: 1933/08/03 Referring Provider (PT): Rondel Jumbo, PA-C   Encounter Date: 09/30/2021   PT End of Session - 09/30/21 1454     Visit Number 11    Number of Visits 16    Date for PT Re-Evaluation 10/12/21    Authorization Type U[employer] Medicare    Progress Note Due on Visit 16   Recert on visit #8 - 08/24/56   PT Start Time 1407   pt late   PT Stop Time 1441    PT Time Calculation (min) 34 min    Activity Tolerance Patient tolerated treatment well    Behavior During Therapy [facility] for tasks assessed/performed             Past Medical History:  Diagnosis Date   Allergy    seasonal   Asthma    GERD (gastroesophageal reflux disease)    Hyperlipidemia    Hypertension    Transient cerebral ischemia     Past Surgical History:  Procedure Laterality Date   ABDOMINAL HYSTERECTOMY     for fibroids (no BSO)   BUNIONECTOMY Right    CARPAL TUNNEL RELEASE     Bilaterally   COLONOSCOPY  2011   tiny polyp ; [facility]   Macular Degeneration     Dr Zadie Rhine  ; injections   UMBILICAL HERNIA REPAIR      There were no vitals filed for this visit.   Subjective Assessment - 09/30/21 1412     Subjective Pt reports that she has been good.    Patient is accompained by: Family member    Patient Stated Goals Daughter wants pt to get moving some more    Currently in Pain? No/denies                               [facility] Adult PT Treatment/Exercise - 09/30/21 0001       Ambulation/Gait   Ambulation Distance (Feet) 180 Feet    Assistive device Straight cane    Gait Pattern Step-through pattern;Decreased stride length;Decreased hip/knee flexion - right;Decreased hip/knee flexion - left;Poor foot clearance - left;Poor  foot clearance - right      Knee/Hip Exercises: Aerobic   Recumbent Bike x 4 min no resistance      Knee/Hip Exercises: Standing   Other Standing Knee Exercises fwd stepping with WS x 15      Knee/Hip Exercises: Seated   Long Arc Quad Strengthening;Both;10 reps;Weights    Long Arc Quad Weight 3 lbs.    Long Arc Quad Limitations avoiding TKE on the left due to knee grinding    Marching Strengthening;Both;10 reps;Weights    Marching Limitations 3    Abduction/Adduction  Strengthening;Both;10 reps;Weights    Abd/Adduction Limitations hip abduction    Abd/Adduction Weights 3 lbs.    Sit to Sand 15 reps;without UE support   last 5 reps progressed with shoulder flexion to increase hip extension     Shoulder Exercises: Standing   Other Standing Exercises B UE punching with 2lb weights 10x, very fatigued with this                       PT Short Term Goals - 09/14/21  [place] #1   Title Pt will be independent with HEP    Status On-going   09/14/21 - pt still has not attempted HEP at home   Target Date 08/26/21               PT Long Term Goals - 09/14/21 1414       PT LONG TERM GOAL #1   Title Pt will be independent with performing HEP for continued activity at home    Status On-going    Target Date 10/12/21      PT LONG TERM GOAL #2   Title Pt will be able to improve DGI score to at least 20/24 to demo decreased fall risk    Baseline 16/24    Status Partially Met   09/14/21 - DGI improved to 19/24   Target Date 10/12/21      PT LONG TERM GOAL #3   Title Pt will have improved Berg Balance Score by at least 9 points to demo [place]    Baseline 46/56    Status Partially Met   09/14/21 - Berg improved to 49/56   Target Date 10/12/21      PT LONG TERM GOAL #4   Title Pt will have improved gait speed by at least 0.05 m/s to demo MDIC    Baseline 0.49 m/s    Status Achieved   09/14/21 - gait speed improved to 0.63 m/sec with SPC and 0.75 m/sec  w/o AD   Target Date 09/16/21      PT LONG TERM GOAL #5   Title Pt will demo at least 4-/5 strength in bilat hips    Status Partially Met   09/14/21 - improved, still limited in medial & lateral hips   Target Date 10/12/21                   Plan - 09/30/21 1443     Clinical Impression Statement Pt still not attempting HEP at home or increasing any physical activity at home. I continued to encourage her to try to do some exercise at home as it will help her make progress with PT. We increased the repetitions with a lot of exercises today and resistance. Due to grinding felt in the R knee with TKE, avoided TKE with LAQ. Constant supervision and cues needed for proper sequence with the fwd stepping/WS exercise, she did try to compensate with this exercise quite a bit when fatigued. Postural cues needed with seated marching to avoid trunk extension.    Personal Factors and Comorbidities Age;Fitness;Time since onset of injury/illness/exacerbation    PT Frequency 2x / week    PT Duration 4 weeks    PT Treatment/Interventions ADLs/Self Care Home Management;Aquatic Therapy;Electrical Stimulation;Iontophoresis 68m/ml Dexamethasone;Moist Heat;DME Instruction;Cryotherapy;Gait training;Stair training;Functional mobility training;Therapeutic activities;Therapeutic exercise;Balance training;Neuromuscular re-education;Manual techniques;Patient/family education;Orthotic Fit/Training;Dry needling;Passive range of motion;Taping    PT Next Visit Plan Assess complaince with HEP and update/modify/progress as indicated. Work on [employer] endurance and balance.    PT Home Exercise Plan Access Code: [email or web address] (12/14, modified 1/9); [place](2/6)    Consulted and Agree with Plan of Care Patient;Family member/caregiver    Family Member Consulted Daughter - [place]ye [place]            Patient will benefit from skilled therapeutic intervention in order to improve the following deficits and  impairments:  Abnormal gait, Decreased range of motion, Difficulty walking, Decreased endurance, Decreased  activity tolerance, Decreased balance, Decreased strength, Decreased mobility, Postural dysfunction  Visit Diagnosis: Muscle weakness (generalized)  Other abnormalities of gait and mobility  Difficulty in walking, not elsewhere classified     Problem List Patient Active Problem List   Diagnosis Date Noted   Hemorrhoid 05/29/2021   Rash 09/18/2020   Loss of weight 02/04/2016   Normocytic anemia 01/30/2016   Routine general medical examination at a health care facility 12/18/2014   Accelerated hypertension 03/12/2014   GERD 08/29/2008   HYPERLIPIDEMIA 06/14/2007   Asthma with allergic rhinitis without complication 02/58/5277   Allergic rhinitis 11/07/2006   SPINAL STENOSIS 11/07/2006    Artist Pais, PTA 09/30/2021, 2:56 PM  [facility] [address]  [place], [place], [address] Phone: [phone]   Fax:  [phone]  Name: Stacy Page MRN: [id number] Date of Birth: [birth year]

## 2021-10-05 ENCOUNTER — Ambulatory Visit: Payer: Medicare Other | Admitting: Physical Therapy

## 2021-10-05 ENCOUNTER — Encounter: Payer: Self-pay | Admitting: Physical Therapy

## 2021-10-05 ENCOUNTER — Other Ambulatory Visit: Payer: Self-pay

## 2021-10-05 DIAGNOSIS — R262 Difficulty in walking, not elsewhere classified: Secondary | ICD-10-CM

## 2021-10-05 DIAGNOSIS — M6281 Muscle weakness (generalized): Secondary | ICD-10-CM | POA: Diagnosis not present

## 2021-10-05 DIAGNOSIS — R2689 Other abnormalities of gait and mobility: Secondary | ICD-10-CM | POA: Diagnosis not present

## 2021-10-05 NOTE — Therapy (Signed)
[facility] [address]  [place], [place], [address] Phone: [phone]   Fax:  [phone]  Physical Therapy Treatment / Discharge Summary  Patient Details  Name: Stacy Page MRN: [id number] Date of Birth: [birth year] Referring Provider (PT): Rondel Jumbo, PA-C   Encounter Date: 10/05/2021   PT End of Session - 10/05/21 1408     Visit Number 12    Number of Visits 16    Date for PT Re-Evaluation 10/12/21    Authorization Type UHC Medicare    Progress Note Due on Visit 16   Recert on visit #8 - 1/82/99   PT Start Time 1408   Pt arrived late   PT Stop Time 1450    PT Time Calculation (min) 42 min    Activity Tolerance Patient tolerated treatment well    Behavior During Therapy [facility] for tasks assessed/performed             Past Medical History:  Diagnosis Date   Allergy    seasonal   Asthma    GERD (gastroesophageal reflux disease)    Hyperlipidemia    Hypertension    Transient cerebral ischemia     Past Surgical History:  Procedure Laterality Date   ABDOMINAL HYSTERECTOMY     for fibroids (no BSO)   BUNIONECTOMY Right    CARPAL TUNNEL RELEASE     Bilaterally   COLONOSCOPY  2011   tiny polyp ; [facility]   Macular Degeneration     Dr Zadie Rhine  ; injections   UMBILICAL HERNIA REPAIR      There were no vitals filed for this visit.   Subjective Assessment - 10/05/21 1409     Subjective Pt reports she has been trying to "get away from using my cane" - states she feels steady w/o the cane.    Patient is accompained by: Family member    Patient Stated Goals Page wants pt to get moving some more    Currently in Pain? No/denies                [facility] PT Assessment - 10/05/21 1408       Assessment   Medical Diagnosis Gait instability    Referring Provider (PT) Rondel Jumbo, PA-C    Next MD Visit 11/17/21      Strength   Right Hip Flexion 4/5    Right Hip Extension 3-/5     Right Hip External Rotation  4-/5    Right Hip Internal Rotation 4+/5    Right Hip ABduction 4-/5    Right Hip ADduction 3+/5    Left Hip Flexion 4/5    Left Hip Extension 3-/5    Left Hip External Rotation 4-/5    Left Hip Internal Rotation 4+/5    Left Hip ABduction 4-/5    Left Hip ADduction 3+/5    Right Knee Flexion 4+/5    Right Knee Extension 4+/5    Left Knee Flexion 4+/5    Left Knee Extension 4+/5    Right Ankle Dorsiflexion 4+/5    Left Ankle Dorsiflexion 4/5      Ambulation/Gait   Assistive device None    Gait velocity 0.76 m/s      Standardized Balance Assessment   10 Meter Walk 13.19 sec w/o AD      Berg Balance Test   Sit to Stand Able to stand without using hands and stabilize independently    Standing Unsupported  Able to stand safely 2 minutes    Sitting with Back Unsupported but Feet Supported on Floor or Stool Able to sit safely and securely 2 minutes    Stand to Sit Sits safely with minimal use of hands    Transfers Able to transfer safely, minor use of hands    Standing Unsupported with Eyes Closed Able to stand 10 seconds safely    Standing Unsupported with Feet Together Able to place feet together independently and stand 1 minute safely    From Standing, Reach Forward with Outstretched Arm Can reach confidently >25 cm (10")    From Standing Position, Pick up Object from Floor Able to pick up shoe safely and easily    From Standing Position, Turn to Look Behind Over each Shoulder Looks behind from both sides and weight shifts well    Turn 360 Degrees Able to turn 360 degrees safely in 4 seconds or less    Standing Unsupported, Alternately Place Feet on Step/Stool Able to stand independently and safely and complete 8 steps in 20 seconds    Standing Unsupported, One Foot in Front Able to plae foot ahead of the other independently and hold 30 seconds    Standing on One Leg Able to lift leg independently and hold equal to or more than 3 seconds   L SLS only    Total Score 53    Berg comment: 52-55 lower fall risk (> 25%)      Dynamic Gait Index   Level Surface Normal    Change in Gait Speed Normal    Gait with Horizontal Head Turns Normal    Gait with Vertical Head Turns Normal    Gait and Pivot Turn Moderate Impairment    Step Over Obstacle Mild Impairment    Step Around Obstacles Mild Impairment    Steps Mild Impairment    Total Score 19    DGI comment: Scores of 19 or less are predictive of falls in older community living adults                                      PT Short Term Goals - 10/05/21 1410       PT SHORT TERM GOAL #1   Title Pt will be independent with HEP    Status Not Met   10/05/21 - pt still has not attempted HEP at home   Target Date 08/26/21               PT Long Term Goals - 10/05/21 1411       PT LONG TERM GOAL #1   Title Pt will be independent with performing HEP for continued activity at home    Status Not Met      PT LONG TERM GOAL #2   Title Pt will be able to improve DGI score to at least 20/24 to demo decreased fall risk    Baseline 16/24; 09/14/21 - DGI improved to 19/24; 10/05/21 - DGI = 19/24    Status Partially Met      PT LONG TERM GOAL #3   Title Pt will have improved Berg Balance Score by at least 9 points to demo [place]    Baseline 46/56; 09/14/21 - Berg improved to 49/56; 10/05/21 - Berg increased to 53/56    Status Partially Met      PT LONG TERM GOAL #4   Title Pt  will have improved gait speed by at least 0.05 m/s to demo MDIC    Baseline 0.49 m/s; 10/05/21 = 0.76 m/s    Status Achieved   09/14/21 - gait speed improved to 0.63 m/sec with SPC and 0.75 m/sec w/o AD     PT LONG TERM GOAL #5   Title Pt will demo at least 4-/5 strength in bilat hips    Status Partially Met                   Plan - 10/05/21 1450     Clinical Impression Statement Stacy Page still has not attempted any of the HEP or [place] exercises offered as a home program despite repeated  encouragement by PT and reported prompting by her Page. She has demonstrated some gains in LE strength and balance since the initial eval with majority of LTGs at least partially met and gait speed goal met, but progress not what it could have been if she were more compliant with her HEP and progress is starting to plateau. Current status discussed with Stacy Page and plan made to take a break from PT with patient to work on her HEP and/or the [place] program. If issues arise with HEP performance or mobility/balance status declines, patients Page is aware that they can seek a new PT order to return to PT.    PT Treatment/Interventions ADLs/Self Care Home Management;Aquatic Therapy;Electrical Stimulation;Iontophoresis 5m/ml Dexamethasone;Moist Heat;DME Instruction;Cryotherapy;Gait training;Stair training;Functional mobility training;Therapeutic activities;Therapeutic exercise;Balance training;Neuromuscular re-education;Manual techniques;Patient/family education;Orthotic Fit/Training;Dry needling;Passive range of motion;Taping    PT Next Visit Plan transition to HEP with discharge from PT    PT Home Exercise Plan Access Code: [email or web address] (12/14, modified 1/9); [place](2/6[place]    Consulted and Agree with Plan of Care Patient;Family member/caregiver    Family Member Consulted Page - Stacy Page            Patient will benefit from skilled therapeutic intervention in order to improve the following deficits and impairments:  Abnormal gait, Decreased range of motion, Difficulty walking, Decreased endurance, Decreased activity tolerance, Decreased balance, Decreased strength, Decreased mobility, Postural dysfunction  Visit Diagnosis: Muscle weakness (generalized)  Other abnormalities of gait and mobility  Difficulty in walking, not elsewhere classified     Problem List Patient Active Problem List   Diagnosis Date Noted   Hemorrhoid 05/29/2021   Rash  09/18/2020   Loss of weight 02/04/2016   Normocytic anemia 01/30/2016   Routine general medical examination at a health care facility 12/18/2014   Accelerated hypertension 03/12/2014   GERD 08/29/2008   HYPERLIPIDEMIA 06/14/2007   Asthma with allergic rhinitis without complication 137/62/8315  Allergic rhinitis 11/07/2006   SPINAL STENOSIS 11/07/2006    PHYSICAL THERAPY DISCHARGE SUMMARY  Visits from Start of Care: 12  Current functional level related to goals / functional outcomes:   Refer to above clinical impression and goal assessment.   Remaining deficits:   Poor/nonexistent compliance with HEP; mild/moderate proximal LE weakness; borderline high fall risk per DGI score of 19/24   Education / Equipment:   HEP and [employer] [employer]   Patient agrees to discharge. Patient goals were partially met. Patient is being discharged due to  plateauing with progress due to poor compliance with HEP.  JPercival Spanish PT 10/05/2021, 6:28 PM  [facility] [place] [address]Phone: [phone]  Fax:  [phone] Name: ERaigen  [place] MRN: [id number] Date of Birth: 01/21/1933

## 2021-11-17 ENCOUNTER — Encounter: Payer: Self-pay | Admitting: Physician Assistant

## 2021-11-17 ENCOUNTER — Ambulatory Visit: Payer: Medicare Other | Admitting: Physician Assistant

## 2021-11-17 ENCOUNTER — Other Ambulatory Visit: Payer: Self-pay

## 2021-11-17 VITALS — BP 128/80 | HR 88 | Resp 18 | Ht 59.0 in | Wt 136.0 lb

## 2021-11-17 DIAGNOSIS — F03A0 Unspecified dementia, mild, without behavioral disturbance, psychotic disturbance, mood disturbance, and anxiety: Secondary | ICD-10-CM | POA: Diagnosis not present

## 2021-11-17 DIAGNOSIS — F03B0 Unspecified dementia, moderate, without behavioral disturbance, psychotic disturbance, mood disturbance, and anxiety: Secondary | ICD-10-CM | POA: Diagnosis not present

## 2021-11-17 MED ORDER — MEMANTINE HCL 5 MG PO TABS: ORAL_TABLET | ORAL | 11 refills | Status: DC

## 2021-11-17 MED ORDER — DONEPEZIL HCL 10 MG PO TABS: ORAL_TABLET | ORAL | 3 refills | Status: DC
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
## 2021-11-17 NOTE — Patient Instructions (Signed)
Good to see you!  ?Continue Donepezil '10mg'$  daily.  ?Start Memantine '5mg'$  tablets.  Take 1 tablet at bedtime for 2 weeks, then 1 tablet twice daily.   Side effects include dizziness, headache, diarrhea or constipation.  Call if severe symptoms appear  ?Referral to Physical Therapy  ?Encourage increasing activity, including joining a day program.  ?Follow-up in 6 months, call for any changes ? ? ?FALL PRECAUTIONS: Be cautious when walking. Scan the area for obstacles that may increase the risk of trips and falls. When getting up in the mornings, sit up at the edge of the bed for a few minutes before getting out of bed. Consider elevating the bed at the head end to avoid drop of blood pressure when getting up. Walk always in a well-lit room (use night lights in the walls). Avoid area rugs or power cords from appliances in the middle of the walkways. Use a walker or a cane if necessary and consider physical therapy for balance exercise. Get your eyesight checked regularly. ? ?HOME SAFETY: Consider the safety of the kitchen when operating appliances like stoves, microwave oven, and blender. Consider having supervision and share cooking responsibilities until no longer able to participate in those. Accidents with firearms and other hazards in the house should be identified and addressed as well. ? ? ?ABILITY TO BE LEFT ALONE: If patient is unable to contact 911 operator, consider using LifeLine, or when the need is there, arrange for someone to stay with patients. Smoking is a fire hazard, consider supervision or cessation. Risk of wandering should be assessed by caregiver and if detected at any point, supervision and safe proof recommendations should be instituted. ? ?RECOMMENDATIONS FOR ALL PATIENTS WITH MEMORY PROBLEMS: ?1. Continue to exercise (Recommend 30 minutes of walking everyday, or 3 hours every week) ?2. Increase social interactions - continue going to [place] and enjoy social gatherings with friends and  family ?3. Eat healthy, avoid fried foods and eat more fruits and vegetables ?4. Maintain adequate blood pressure, blood sugar, and blood cholesterol level. Reducing the risk of stroke and cardiovascular disease also helps promoting better memory. ?5. Avoid stressful situations. Live a simple life and avoid aggravations. Organize your time and prepare for the next day in anticipation. ?6. Sleep well, avoid any interruptions of sleep and avoid any distractions in the bedroom that may interfere with adequate sleep quality ?7. Avoid sugar, avoid sweets as there is a strong link between excessive sugar intake, diabetes, and cognitive impairment ?The Mediterranean diet has been shown to help patients reduce the risk of progressive memory disorders and reduces cardiovascular risk. This includes eating fish, eat fruits and green leafy vegetables, nuts like almonds and hazelnuts, walnuts, and also use olive oil. Avoid fast foods and fried foods as much as possible. Avoid sweets and sugar as sugar use has been linked to worsening of memory function. ? ?There is always a concern of gradual progression of memory problems. If this is the case, then we may need to adjust level of care according to patient needs. Support, both to the patient and caregiver, should then be put into place. ? ?

## 2021-12-01 ENCOUNTER — Other Ambulatory Visit: Payer: Self-pay

## 2021-12-01 ENCOUNTER — Encounter: Payer: Self-pay | Admitting: Physician Assistant

## 2021-12-01 DIAGNOSIS — R2681 Unsteadiness on feet: Secondary | ICD-10-CM

## 2021-12-03 ENCOUNTER — Other Ambulatory Visit: Payer: Self-pay | Admitting: Internal Medicine

## 2021-12-09 ENCOUNTER — Telehealth: Payer: Self-pay

## 2021-12-09 NOTE — Telephone Encounter (Signed)
Sovial service, is going to send some medical records over to disclose her information of appts, orders.etc ?

## 2021-12-22 ENCOUNTER — Other Ambulatory Visit: Payer: Self-pay | Admitting: Internal Medicine

## 2021-12-31 ENCOUNTER — Encounter: Payer: Self-pay | Admitting: Physical Therapy

## 2021-12-31 ENCOUNTER — Ambulatory Visit: Payer: Medicare Other | Attending: Physician Assistant | Admitting: Physical Therapy

## 2021-12-31 ENCOUNTER — Ambulatory Visit: Payer: Medicare Other

## 2021-12-31 DIAGNOSIS — R262 Difficulty in walking, not elsewhere classified: Secondary | ICD-10-CM | POA: Insufficient documentation

## 2021-12-31 DIAGNOSIS — M6281 Muscle weakness (generalized): Secondary | ICD-10-CM | POA: Insufficient documentation

## 2021-12-31 DIAGNOSIS — R2681 Unsteadiness on feet: Secondary | ICD-10-CM | POA: Diagnosis not present

## 2021-12-31 NOTE — Therapy (Signed)
?OUTPATIENT PHYSICAL THERAPY LOWER EXTREMITY EVALUATION ? ? ?Patient Name: Stacy Page ?MRN: [id number] ?DOB:[birth year], 88 y.o., female ?Today's Date: 12/31/2021 ? ? PT End of Session - 12/31/21 1504   ? ? Visit Number 1   ? Number of Visits 6   ? Date for PT Re-Evaluation 02/11/22   ? PT Start Time 1455   ? PT Stop Time 1530   ? PT Time Calculation (min) 35 min   ? ?  ?  ? ?  ? ? ?Past Medical History:  ?Diagnosis Date  ? Allergy   ? seasonal  ? Asthma   ? GERD (gastroesophageal reflux disease)   ? Hyperlipidemia   ? Hypertension   ? Transient cerebral ischemia   ? ?Past Surgical History:  ?Procedure Laterality Date  ? ABDOMINAL HYSTERECTOMY    ? for fibroids (no BSO)  ? BUNIONECTOMY Right   ? CARPAL TUNNEL RELEASE    ? Bilaterally  ? COLONOSCOPY  2011  ? tiny polyp ; [facility]  ? Macular Degeneration    ? Dr Zadie Rhine  ; injections  ? UMBILICAL HERNIA REPAIR    ? ?Patient Active Problem List  ? Diagnosis Date Noted  ? Hemorrhoid 05/29/2021  ? Rash 09/18/2020  ? Loss of weight 02/04/2016  ? Normocytic anemia 01/30/2016  ? Routine general medical examination at a health care facility 12/18/2014  ? Accelerated hypertension 03/12/2014  ? GERD 08/29/2008  ? HYPERLIPIDEMIA 06/14/2007  ? Asthma with allergic rhinitis without complication 95/18/8416  ? Allergic rhinitis 11/07/2006  ? SPINAL STENOSIS 11/07/2006  ? ? ?PCP: Hoyt Koch MD ? ?REFERRING PROVIDER: Rondel Jumbo PA-C ? ?REFERRING DIAG: R26.81 (ICD-10-CM) - Gait instability ? ?THERAPY DIAG:  ?Muscle weakness (generalized) - Plan: PT plan of care cert/re-cert ? ?Difficulty in walking, not elsewhere classified - Plan: PT plan of care cert/re-cert ? ?Unsteadiness on feet - Plan: PT plan of care cert/re-cert ? ?ONSET DATE: ongoing ? ?SUBJECTIVE:  ? ?SUBJECTIVE STATEMENT: ?Pt accompanied by daughter.  Reports she gets tired in hips when walking.  Daughter reports no significant changes since last seen for PT other than new medication for memory.   ? ?PERTINENT HISTORY: ?TIAs, moderate dementia, HTN ,hyperlipidemia, chronic microvascular disease ? ?PAIN:  ?Are you having pain? No ? ?PRECAUTIONS: Fall ? ?WEIGHT BEARING RESTRICTIONS No ? ?FALLS:  ?Has patient fallen in last 6 months? No ? ?LIVING ENVIRONMENT: ?Lives with: lives with their family ?Lives in: House/apartment ?Stairs: No ?Has following equipment at home: Single point cane ? ?OCCUPATION: retired ? ?PLOF: Independent with basic ADLs ? ?PATIENT GOALS stay out of a wheelchair ? ? ?OBJECTIVE:  ? ?DIAGNOSTIC FINDINGS: no recent imaging.  2017 Brain MRI demonstrated chronic small vessel ischemia.  ? ?PATIENT SURVEYS:  ?ABC scale 57% ? ?COGNITION: ? Overall cognitive status: History of cognitive impairments - at baseline   ?  ?SENSATION: ?Not tested ? ?POSTURE:  ?Forward head posture ? ?LE MMT: ? ?MMT Right ?12/31/2021 Left ?12/31/2021  ?Hip flexion 4 4+  ?Hip extension 4 4  ?Hip abduction 5 5  ?Hip adduction 5 5  ?Hip internal rotation    ?Hip external rotation    ?Knee flexion 4+ 4+  ?Knee extension 5 5  ?Ankle dorsiflexion 5 5  ?Ankle plantarflexion    ?Ankle inversion    ?Ankle eversion    ? (Blank rows = not tested) ? ?FUNCTIONAL TESTS:  ?5 times sit to stand: 12.6 sec  ?2 minute walk test: 95 ft, with 2 seated  rest breaks ?MCTSIB: Condition 1: Avg of 3 trials: 30 sec, Condition 2: Avg of 3 trials: 30 sec, Condition 3: Avg of 3 trials: 30 sec, Condition 4: Avg of 3 trials: 5 sec, and Total Score: 95/120 ? ?GAIT: ?Distance walked: 95  ?Assistive device utilized: Single point cane ?Level of assistance: SBA ?Comments: decreased gait speed, cues needed to advance cane, 0.47 m/s, pushing L hip during gait "tired" ? ? ? ?TODAY'S TREATMENT: ?12/31/21- self care - education on importance of regular exercise, getting involved in community program to help motivate, given information on [facility] [place].  ? ? ?PATIENT EDUCATION:  ?Education details: plan of care, initial HEP, information on senior center   ?Person educated: Patient and daughter/caregiver ?Education method: Explanation and Handouts ?Education comprehension: verbalized understanding ? ? ?HOME EXERCISE PROGRAM: ?Access Code: [email or web address] ?URL: [email or web address] ?Date: 12/31/2021 ?Prepared by: Glenetta Hew ? ?Exercises ?- Heel Raises with Counter Support  - 2 x daily - 7 x weekly - 1-2 sets - 10 reps ?- Standing March with Counter Support  - 2 x daily - 7 x weekly - 1-2 sets - 10 reps ?- Sit to Stand  - 2 x daily - 7 x weekly - 1-2 sets - 10 reps ? ?ASSESSMENT: ? ?CLINICAL IMPRESSION: ?Patient is a 88 y.o. female who was seen today for physical therapy evaluation and treatment for unsteadiness.  Accompanied by daughter who helps her at home.  Daughter reports that she is not motivated to exercise at home.  We discussed that carry over is important and that she did need to perform exercises daily with goal of transitioning to walking program or regular exercise program.  She demonstrates decreased endurance, hip weakness, and increased risk of falls based on gait speed, 2MWT and mCTSIB.  She would benefit from skilled physical therapy to decrease risk of falls and improve endurance.  ? ? ?OBJECTIVE IMPAIRMENTS Abnormal gait, decreased activity tolerance, decreased balance, decreased endurance, decreased mobility, difficulty walking, decreased strength, impaired perceived functional ability, and prosthetic dependency .  ? ?ACTIVITY LIMITATIONS community activity and shopping.  ? ?PERSONAL FACTORS Age, Behavior pattern, Fitness, and 1-2 comorbidities: TIAs, moderate dementia, HTN ,hyperlipidemia, chronic microvascular disease  are also affecting patient's functional outcome.  ? ? ?REHAB POTENTIAL: Good ? ?CLINICAL DECISION MAKING: Evolving/moderate complexity ? ?EVALUATION COMPLEXITY: Moderate ? ? ?GOALS: ?Goals reviewed with patient? Yes ? ?SHORT TERM GOALS: Target date: 01/14/2022   ?Compliance with initial HEP ?Baseline: does not  perform at home ?Goal status: INITIAL ? ? ?LONG TERM GOALS: Target date: 02/11/2022   ? ?Compliant with progressed HEP ?Baseline: not compliant in past ?Goal status: INITIAL ? ?2.  Be able to ambulate 300' without seated rest break.  ?Baseline: 95' with 2 seated rest breaks ?Goal status: INITIAL ? ?3.  Improve gait speed to >0.55 m/s for safety with ambulation ?Baseline: 0.47 m/s ?Goal status: INITIAL ? ?4.  Transition to community fitness program to maintain progress and engagement.  ?Baseline: information provided ?Goal status: INITIAL ? ? ?PLAN: ?PT FREQUENCY: 1x/week ? ?PT DURATION: 6 weeks ? ?PLANNED INTERVENTIONS: Therapeutic exercises, Therapeutic activity, Neuromuscular re-education, Balance training, Gait training, Patient/Family education, Joint mobilization, and Stair training ? ?PLAN FOR NEXT SESSION: review and progress HEP to improve LE strength and endurance.  ? ? ?Rennie Natter, PT, DPT  ?12/31/2021, 6:21 PM  ?

## 2022-01-17 ENCOUNTER — Other Ambulatory Visit: Payer: Self-pay | Admitting: Neurology

## 2022-01-28 ENCOUNTER — Ambulatory Visit: Payer: Medicare Other | Attending: Physician Assistant | Admitting: Physical Therapy

## 2022-01-28 ENCOUNTER — Encounter: Payer: Self-pay | Admitting: Physical Therapy

## 2022-01-28 DIAGNOSIS — R2681 Unsteadiness on feet: Secondary | ICD-10-CM

## 2022-01-28 DIAGNOSIS — R262 Difficulty in walking, not elsewhere classified: Secondary | ICD-10-CM

## 2022-01-28 DIAGNOSIS — M6281 Muscle weakness (generalized): Secondary | ICD-10-CM | POA: Diagnosis not present

## 2022-01-28 DIAGNOSIS — R2689 Other abnormalities of gait and mobility: Secondary | ICD-10-CM

## 2022-01-28 NOTE — Therapy (Signed)
OUTPATIENT PHYSICAL THERAPY TREATMENT NOTE  Rationale for Evaluation and Treatment Rehabilitation  Patient Name: Stacy Page MRN: [id number] DOB:[birth year], 88 y.o., female Today's Date: 01/28/2022   PT End of Session - 01/28/22 1538     Visit Number 2    Number of Visits 6    Date for PT Re-Evaluation 02/11/22    PT Start Time 8938    PT Stop Time 1017    PT Time Calculation (min) 36 min             Past Medical History:  Diagnosis Date   Allergy    seasonal   Asthma    GERD (gastroesophageal reflux disease)    Hyperlipidemia    Hypertension    Transient cerebral ischemia    Past Surgical History:  Procedure Laterality Date   ABDOMINAL HYSTERECTOMY     for fibroids (no BSO)   BUNIONECTOMY Right    CARPAL TUNNEL RELEASE     Bilaterally   COLONOSCOPY  2011   tiny polyp ; [facility]   Macular Degeneration     Dr Zadie Rhine  ; injections   UMBILICAL HERNIA REPAIR     Patient Active Problem List   Diagnosis Date Noted   Hemorrhoid 05/29/2021   Rash 09/18/2020   Loss of weight 02/04/2016   Normocytic anemia 01/30/2016   Routine general medical examination at a health care facility 12/18/2014   Accelerated hypertension 03/12/2014   GERD 08/29/2008   HYPERLIPIDEMIA 06/14/2007   Asthma with allergic rhinitis without complication 51/09/5850   Allergic rhinitis 11/07/2006   SPINAL STENOSIS 11/07/2006    PCP: Hoyt Koch MD  REFERRING PROVIDER: Rondel Jumbo. PA-C  REFERRING DIAG: R26.81 (ICD-10-CM) - Gait instability  THERAPY DIAG:  Muscle weakness (generalized)  Difficulty in walking, not elsewhere classified  Unsteadiness on feet  Other abnormalities of gait and mobility  ONSET DATE: ongoing  SUBJECTIVE:   SUBJECTIVE STATEMENT: Pt. Reports she has not bee compliant with HEP.   PERTINENT HISTORY: TIAs, moderate dementia, HTN ,hyperlipidemia, chronic microvascular disease  PAIN:  Are you having pain? No  PRECAUTIONS:  Fall  WEIGHT BEARING RESTRICTIONS No  FALLS:  Has patient fallen in last 6 months? No  LIVING ENVIRONMENT: Lives with: lives with their family Lives in: House/apartment Stairs: No Has following equipment at home: Single point cane  OCCUPATION: retired  PLOF: Independent with basic ADLs  PATIENT GOALS stay out of a wheelchair   OBJECTIVE:   DIAGNOSTIC FINDINGS: no recent imaging.  2017 Brain MRI demonstrated chronic small vessel ischemia.   PATIENT SURVEYS:  ABC scale 57%  COGNITION:  Overall cognitive status: History of cognitive impairments - at baseline     SENSATION: Not tested  POSTURE:  Forward head posture  LE MMT:  MMT Right 01/28/2022 Left 01/28/2022  Hip flexion 4 4+  Hip extension 4 4  Hip abduction 5 5  Hip adduction 5 5  Hip internal rotation    Hip external rotation    Knee flexion 4+ 4+  Knee extension 5 5  Ankle dorsiflexion 5 5  Ankle plantarflexion    Ankle inversion    Ankle eversion     (Blank rows = not tested)  FUNCTIONAL TESTS:  5 times sit to stand: 12.6 sec  2 minute walk test: 95 ft, with 2 seated rest breaks MCTSIB: Condition 1: Avg of 3 trials: 30 sec, Condition 2: Avg of 3 trials: 30 sec, Condition 3: Avg of 3 trials: 30 sec, Condition 4: Avg  of 3 trials: 5 sec, and Total Score: 95/120  GAIT: Distance walked: 95  Assistive device utilized: Single point cane Level of assistance: SBA Comments: decreased gait speed, cues needed to advance cane, 0.47 m/s, pushing L hip during gait "tired"  TODAY'S TREATMENT: 01/28/2022- Therapeutic Exercise: to improve strength and mobility.  Demo, verbal and tactile cues throughout for technique. Nustep L5 x 5 min  Sit to stands x 10 At counter for safety: Heel raises x 10 Hip Abduction x 10 bil Hip extension x 10 bil  Marching - increased LBP today Seated   Seated marching x 10 bil     Rainbow hip flexion 2 x 10 bil   LAQ x 10 bil  Hip abduction RTB x 10  Marching RTB x 10    Hip ER  with RTB x 10  12/31/21- self care - education on importance of regular exercise, getting involved in community program to help motivate, given information on [facility] [place].    PATIENT EDUCATION:  Education details: HEP update Person educated: Patient and Associate Professor method: Theatre stage manager Education comprehension: verbalized understanding   HOME EXERCISE PROGRAM: Access Code: [email or web address] URL: [email or web address] Date: 12/31/2021 Prepared by: [place] with Counter Support  - 2 x daily - 7 x weekly - 1-2 sets - 10 reps - Standing March with Counter Support  - 2 x daily - 7 x weekly - 1-2 sets - 10 reps - Sit to Stand  - 2 x daily - 7 x weekly - 1-2 sets - 10 reps  ASSESSMENT:  CLINICAL IMPRESSION: Stacy Page reports that she has not been compliant with HEP. Discussed and she agreed to perform if daughter reminded her (daughter says she does remind her daily now).  Again reminded that just coming to PT is not enough to progress and she has to perform her HEP at home to improve.  Reviewed and progressed HEP, focusing on counter exercises and seated exercises as needed.  She would benefit from continued skilled therapy.   OBJECTIVE IMPAIRMENTS Abnormal gait, decreased activity tolerance, decreased balance, decreased endurance, decreased mobility, difficulty walking, decreased strength, impaired perceived functional ability, and prosthetic dependency .   ACTIVITY LIMITATIONS community activity and shopping.   PERSONAL FACTORS Age, Behavior pattern, Fitness, and 1-2 comorbidities: TIAs, moderate dementia, HTN ,hyperlipidemia, chronic microvascular disease  are also affecting patient's functional outcome.    REHAB POTENTIAL: Good  CLINICAL DECISION MAKING: Evolving/moderate complexity  EVALUATION COMPLEXITY: Moderate   GOALS: Goals reviewed with patient? Yes  SHORT TERM GOALS: Target date:  01/14/2022   Compliance with initial HEP Baseline: does not perform at home Goal status: IN PROGRESS   LONG TERM GOALS: Target date: 02/11/2022    Compliant with progressed HEP Baseline: not compliant in past Goal status: IN PROGRESS  2.  Be able to ambulate 300' without seated rest break.  Baseline: 95' with 2 seated rest breaks Goal status: IN PROGRESS  3.  Improve gait speed to >0.55 m/s for safety with ambulation Baseline: 0.47 m/s Goal status: IN PROGRESS  4.  Transition to community fitness program to maintain progress and engagement.  Baseline: information provided Goal status: IN PROGRESS   PLAN: PT FREQUENCY: 1x/week  PT DURATION: 6 weeks  PLANNED INTERVENTIONS: Therapeutic exercises, Therapeutic activity, Neuromuscular re-education, Balance training, Gait training, Patient/Family education, Joint mobilization, and Stair training  PLAN FOR NEXT SESSION: review and progress HEP to improve LE strength and endurance.  Rennie Natter, PT, DPT  01/28/2022, 4:31 PM

## 2022-02-07 ENCOUNTER — Other Ambulatory Visit: Payer: Self-pay | Admitting: Internal Medicine

## 2022-02-11 ENCOUNTER — Ambulatory Visit: Payer: Medicare Other | Admitting: Physical Therapy

## 2022-02-18 ENCOUNTER — Encounter: Payer: Self-pay | Admitting: Physical Therapy

## 2022-02-18 ENCOUNTER — Ambulatory Visit: Payer: Medicare Other | Admitting: Physical Therapy

## 2022-02-18 DIAGNOSIS — R2689 Other abnormalities of gait and mobility: Secondary | ICD-10-CM | POA: Diagnosis not present

## 2022-02-18 DIAGNOSIS — M6281 Muscle weakness (generalized): Secondary | ICD-10-CM

## 2022-02-18 DIAGNOSIS — R2681 Unsteadiness on feet: Secondary | ICD-10-CM

## 2022-02-18 DIAGNOSIS — R262 Difficulty in walking, not elsewhere classified: Secondary | ICD-10-CM | POA: Diagnosis not present

## 2022-02-18 NOTE — Therapy (Signed)
OUTPATIENT PHYSICAL THERAPY TREATMENT NOTE Progress Note Reporting Period 12/31/2021 to 02/18/2022  See note below for Objective Data and Assessment of Progress/Goals.     Rationale for Evaluation and Treatment Rehabilitation  Patient Name: Stacy Page MRN: [id number] DOB:05/11/1933, 88 y.o., female Today's Date: 02/18/2022   PT End of Session - 02/18/22 1358     Visit Number 3    Number of Visits 6    Date for PT Re-Evaluation 03/04/22    PT Start Time 1400    PT Stop Time 1440    PT Time Calculation (min) 40 min             Past Medical History:  Diagnosis Date   Allergy    seasonal   Asthma    GERD (gastroesophageal reflux disease)    Hyperlipidemia    Hypertension    Transient cerebral ischemia    Past Surgical History:  Procedure Laterality Date   ABDOMINAL HYSTERECTOMY     for fibroids (no BSO)   BUNIONECTOMY Right    CARPAL TUNNEL RELEASE     Bilaterally   COLONOSCOPY  2011   tiny polyp ; [facility]   Macular Degeneration     Dr Zadie Rhine  ; injections   UMBILICAL HERNIA REPAIR     Patient Active Problem List   Diagnosis Date Noted   Hemorrhoid 05/29/2021   Rash 09/18/2020   Loss of weight 02/04/2016   Normocytic anemia 01/30/2016   Routine general medical examination at a health care facility 12/18/2014   Accelerated hypertension 03/12/2014   GERD 08/29/2008   HYPERLIPIDEMIA 06/14/2007   Asthma with allergic rhinitis without complication 27/01/2375   Allergic rhinitis 11/07/2006   SPINAL STENOSIS 11/07/2006    PCP: Hoyt Koch MD  REFERRING PROVIDER: Rondel Jumbo. PA-C  REFERRING DIAG: R26.81 (ICD-10-CM) - Gait instability  THERAPY DIAG:  Muscle weakness (generalized)  Difficulty in walking, not elsewhere classified  Unsteadiness on feet  Other abnormalities of gait and mobility  ONSET DATE: ongoing  SUBJECTIVE:   SUBJECTIVE STATEMENT: Pt still not complaint with HEP.  Daughter reports difficulty motivating but  has been trying.  No new falls.    PERTINENT HISTORY: TIAs, moderate dementia, HTN ,hyperlipidemia, chronic microvascular disease  PAIN:  Are you having pain? No  PRECAUTIONS: Fall  WEIGHT BEARING RESTRICTIONS No  FALLS:  Has patient fallen in last 6 months? No  LIVING ENVIRONMENT: Lives with: lives with their family Lives in: House/apartment Stairs: No Has following equipment at home: Single point cane  OCCUPATION: retired  PLOF: Independent with basic ADLs  PATIENT GOALS stay out of a wheelchair   OBJECTIVE:   DIAGNOSTIC FINDINGS: no recent imaging.  2017 Brain MRI demonstrated chronic small vessel ischemia.   PATIENT SURVEYS:  ABC scale 57%  COGNITION:  Overall cognitive status: History of cognitive impairments - at baseline     SENSATION: Not tested  POSTURE:  Forward head posture  LE MMT:  MMT Right 02/18/2022 Left 02/18/2022  Hip flexion 4 4+  Hip extension 4 4  Hip abduction 5 5  Hip adduction 5 5  Hip internal rotation    Hip external rotation    Knee flexion 4+ 4+  Knee extension 5 5  Ankle dorsiflexion 5 5  Ankle plantarflexion    Ankle inversion    Ankle eversion     (Blank rows = not tested)  FUNCTIONAL TESTS:  5 times sit to stand: 12.6 sec  2 minute walk test: 95 ft, with  2 seated rest breaks 02/18/22- 270' with 1 standing rest break MCTSIB: Condition 1: Avg of 3 trials: 30 sec, Condition 2: Avg of 3 trials: 30 sec, Condition 3: Avg of 3 trials: 30 sec, Condition 4: Avg of 3 trials: 5 sec, and Total Score: 95/120  GAIT: Distance walked: 95  Assistive device utilized: Single point cane Level of assistance: SBA Comments: decreased gait speed, cues needed to advance cane, 0.47 m/s, pushing L hip during gait "tired"  TODAY'S TREATMENT: 02/18/22 Therapeutic Exercise: to improve strength and mobility.  Demo, verbal and tactile cues throughout for technique. Nustep L4 x 6 min  Gait 2 x 2 min  Therapeutic Activities: dancing to jazz  music with 2 HHA for safety to encourage more movement- alternating sitting and  standing, incorporating ankle, hip, LE, core, arm strengthening.   01/28/2022- Therapeutic Exercise: to improve strength and mobility.  Demo, verbal and tactile cues throughout for technique. Nustep L5 x 5 min  Sit to stands x 10 At counter for safety: Heel raises x 10 Hip Abduction x 10 bil Hip extension x 10 bil  Marching - increased LBP today Seated   Seated marching x 10 bil     Rainbow hip flexion 2 x 10 bil   LAQ x 10 bil  Hip abduction RTB x 10  Marching RTB x 10    Hip ER with RTB x 10  12/31/21- self care - education on importance of regular exercise, getting involved in community program to help motivate, given information on [facility] [place].    PATIENT EDUCATION:  Education details: HEP update Person educated: Patient and Associate Professor method: Theatre stage manager Education comprehension: verbalized understanding   HOME EXERCISE PROGRAM: Access Code: [email or web address] URL: [email or web address] Date: 12/31/2021 Prepared by: [place] with Counter Support  - 2 x daily - 7 x weekly - 1-2 sets - 10 reps - Standing March with Counter Support  - 2 x daily - 7 x weekly - 1-2 sets - 10 reps - Sit to Stand  - 2 x daily - 7 x weekly - 1-2 sets - 10 reps  ASSESSMENT:  CLINICAL IMPRESSION: Stacy Page has not been compliant with HEP despite daughter trying, daughter reports she gets very sleepy in afternoon.  Discussed appropriate activities at [place] as one way to improve engagement.  During session Stacy Page reported she used to like [employer], so put on [employer] and she became very engaged, willing to alternate sitting and standing dance, following along demo to increase amplitude of arm and leg movements in sitting.  Encouraged to have daily "dance" party at home instead of focusing on exercises.  She has made  progress, able to maintain gait speed of 0.61ms for 2 min and complete 270' before needing seated rest break.  Extending POC to 03/04/22 due to missed visits and to encourage progress.  Stacy OBerlingcontinues to demonstrate potential for improvement and would benefit from continued skilled therapy to address impairments.     OBJECTIVE IMPAIRMENTS Abnormal gait, decreased activity tolerance, decreased balance, decreased endurance, decreased mobility, difficulty walking, decreased strength, impaired perceived functional ability, and prosthetic dependency .   ACTIVITY LIMITATIONS community activity and shopping.   PERSONAL FACTORS Age, Behavior pattern, Fitness, and 1-2 comorbidities: TIAs, moderate dementia, HTN ,hyperlipidemia, chronic microvascular disease  are also affecting patient's functional outcome.    REHAB POTENTIAL: Good  CLINICAL DECISION MAKING: Evolving/moderate complexity  EVALUATION COMPLEXITY: Moderate  GOALS: Goals reviewed with patient? Yes  SHORT TERM GOALS: Target date: 01/14/2022   Compliance with initial HEP Baseline: does not perform at home Goal status: IN PROGRESS   LONG TERM GOALS: Target date: 03/04/22    Compliant with progressed HEP Baseline: not compliant in past Goal status: IN PROGRESS - modified to [employer].   2.  Be able to ambulate 300' without seated rest break.  Baseline: 95' with 2 seated rest breaks Goal status: IN PROGRESS 02/18/22- 270' with SPC before seated rest break needed.   3.  Improve gait speed to >0.55 m/s for safety with ambulation Baseline: 0.47 m/s Goal status: MET - 0.24ms - maintained x 2 min   4.  Transition to community fitness program to maintain progress and engagement.  Baseline: information provided Goal status: IN PROGRESS 02/17/22- again given information on [facility] programs   PLAN: PT FREQUENCY: 1x/week  PT DURATION: 6 weeks extended to 03/04/22  PLANNED INTERVENTIONS: Therapeutic exercises,  Therapeutic activity, Neuromuscular re-education, Balance training, Gait training, Patient/Family education, Joint mobilization, and Stair training  PLAN FOR NEXT SESSION: review and progress HEP to improve LE strength and endurance.    ERennie Natter PT, DPT  02/18/2022, 2:49 PM

## 2022-02-25 ENCOUNTER — Encounter: Payer: Self-pay | Admitting: Physical Therapy

## 2022-02-25 ENCOUNTER — Ambulatory Visit: Payer: Medicare Other | Attending: Physician Assistant | Admitting: Physical Therapy

## 2022-02-25 DIAGNOSIS — R262 Difficulty in walking, not elsewhere classified: Secondary | ICD-10-CM | POA: Diagnosis not present

## 2022-02-25 DIAGNOSIS — R2681 Unsteadiness on feet: Secondary | ICD-10-CM

## 2022-02-25 DIAGNOSIS — R2689 Other abnormalities of gait and mobility: Secondary | ICD-10-CM

## 2022-02-25 DIAGNOSIS — M6281 Muscle weakness (generalized): Secondary | ICD-10-CM | POA: Diagnosis not present

## 2022-02-25 NOTE — Therapy (Addendum)
OUTPATIENT PHYSICAL THERAPY TREATMENT NOTE PHYSICAL THERAPY DISCHARGE SUMMARY  Visits from Start of Care: 4  Current functional level related to goals / functional outcomes: No significant change, however willing to participate more with dancing activities.     Remaining deficits: See below   Education / Equipment: HEP  Plan:  Patient is being discharged.  She has had very intermittent attendance with PT, attending 4 visits and cancelling 3 visits from 12/31/21 - 04/01/22.  She has not performed HEP.  She is willing to participate in dancing, but this is not a skilled intervention.  Patient and daughter have been given information on [facility] which provides classes to improve activity, have been given OTAGO program to prevent falls, and also given information to check into in-home aide from [facility], as an aide could assist with regular performance of HEP.     Rennie Natter, PT, DPT 3:08 PM 04/01/2022      Rationale for Evaluation and Treatment Rehabilitation  Patient Name: Stacy Page MRN: [id number] DOB:04-27-1933, 88 y.o., female Today's Date: 02/25/2022   PT End of Session - 02/25/22 1323     Visit Number 4    Number of Visits 6    Date for PT Re-Evaluation 03/04/22    PT Start Time 1319    PT Stop Time 3382    PT Time Calculation (min) 38 min             Past Medical History:  Diagnosis Date   Allergy    seasonal   Asthma    GERD (gastroesophageal reflux disease)    Hyperlipidemia    Hypertension    Transient cerebral ischemia    Past Surgical History:  Procedure Laterality Date   ABDOMINAL HYSTERECTOMY     for fibroids (no BSO)   BUNIONECTOMY Right    CARPAL TUNNEL RELEASE     Bilaterally   COLONOSCOPY  2011   tiny polyp ; [facility]   Macular Degeneration     Dr Zadie Rhine  ; injections   UMBILICAL HERNIA REPAIR     Patient Active Problem List   Diagnosis Date Noted   Hemorrhoid 05/29/2021   Rash 09/18/2020   Loss of weight  02/04/2016   Normocytic anemia 01/30/2016   Routine general medical examination at a health care facility 12/18/2014   Accelerated hypertension 03/12/2014   GERD 08/29/2008   HYPERLIPIDEMIA 06/14/2007   Asthma with allergic rhinitis without complication 50/53/9767   Allergic rhinitis 11/07/2006   SPINAL STENOSIS 11/07/2006    PCP: Hoyt Koch MD  REFERRING PROVIDER: Rondel Jumbo. PA-C  REFERRING DIAG: R26.81 (ICD-10-CM) - Gait instability  THERAPY DIAG:  Muscle weakness (generalized)  Difficulty in walking, not elsewhere classified  Unsteadiness on feet  Other abnormalities of gait and mobility  ONSET DATE: ongoing  SUBJECTIVE:   SUBJECTIVE STATEMENT: Patients daughter reports she was more active this week.    PERTINENT HISTORY: TIAs, moderate dementia, HTN ,hyperlipidemia, chronic microvascular disease  PAIN:  Are you having pain? No  PRECAUTIONS: Fall  WEIGHT BEARING RESTRICTIONS No  FALLS:  Has patient fallen in last 6 months? No  LIVING ENVIRONMENT: Lives with: lives with their family Lives in: House/apartment Stairs: No Has following equipment at home: Single point cane  OCCUPATION: retired  PLOF: Independent with basic ADLs  PATIENT GOALS stay out of a wheelchair   OBJECTIVE:   DIAGNOSTIC FINDINGS: no recent imaging.  2017 Brain MRI demonstrated chronic small vessel ischemia.   PATIENT  SURVEYS:  ABC scale 57%  COGNITION:  Overall cognitive status: History of cognitive impairments - at baseline     SENSATION: Not tested  POSTURE:  Forward head posture  LE MMT:  MMT Right 02/25/2022 Left 02/25/2022  Hip flexion 4 4+  Hip extension 4 4  Hip abduction 5 5  Hip adduction 5 5  Hip internal rotation    Hip external rotation    Knee flexion 4+ 4+  Knee extension 5 5  Ankle dorsiflexion 5 5  Ankle plantarflexion    Ankle inversion    Ankle eversion     (Blank rows = not tested)  FUNCTIONAL TESTS:  5 times sit to  stand: 12.6 sec  2 minute walk test: 95 ft, with 2 seated rest breaks 02/18/22- 270' with 1 standing rest break MCTSIB: Condition 1: Avg of 3 trials: 30 sec, Condition 2: Avg of 3 trials: 30 sec, Condition 3: Avg of 3 trials: 30 sec, Condition 4: Avg of 3 trials: 5 sec, and Total Score: 95/120  GAIT: Distance walked: 95  Assistive device utilized: Single point cane Level of assistance: SBA Comments: decreased gait speed, cues needed to advance cane, 0.47 m/s, pushing L hip during gait "tired"  TODAY'S TREATMENT: 02/25/2022  Therapeutic Activities: dancing to jazz music with 2 HHA for safety to encourage more movement- alternating sitting and  standing, incorporating ankle, hip, LE, core, arm strengthening.  Gait throughout session - forward and backwards stepping "shimmy" with 2 HHA, 6 laps around clinic with [facility] with encouragement, beginning, middle, end of session.    02/18/22 Therapeutic Exercise: to improve strength and mobility.  Demo, verbal and tactile cues throughout for technique. Nustep L4 x 6 min  Gait 2 x 2 min  Therapeutic Activities: dancing to jazz music with 2 HHA for safety to encourage more movement- alternating sitting and  standing, incorporating ankle, hip, LE, core, arm strengthening.   01/28/2022- Therapeutic Exercise: to improve strength and mobility.  Demo, verbal and tactile cues throughout for technique. Nustep L5 x 5 min  Sit to stands x 10 At counter for safety: Heel raises x 10 Hip Abduction x 10 bil Hip extension x 10 bil  Marching - increased LBP today Seated   Seated marching x 10 bil     Rainbow hip flexion 2 x 10 bil   LAQ x 10 bil  Hip abduction RTB x 10  Marching RTB x 10    Hip ER with RTB x 10  PATIENT EDUCATION:  Education details: HEP update Person educated: Patient and Associate Professor method: Explanation and Handouts Education comprehension: verbalized understanding   HOME EXERCISE PROGRAM: Access Code:  [email or web address]  ASSESSMENT:  CLINICAL IMPRESSION: Stacy Page was more active this week and willing to participate in more standing and walking exercises with encouragement of music, focusing on increasing standing/walking intervals, but actively moving arms and legs during seated rest breaks.  Stacy Page continues to demonstrate potential for improvement and would benefit from continued skilled therapy to address impairments.     OBJECTIVE IMPAIRMENTS Abnormal gait, decreased activity tolerance, decreased balance, decreased endurance, decreased mobility, difficulty walking, decreased strength, impaired perceived functional ability, and prosthetic dependency .   ACTIVITY LIMITATIONS community activity and shopping.   PERSONAL FACTORS Age, Behavior pattern, Fitness, and 1-2 comorbidities: TIAs, moderate dementia, HTN ,hyperlipidemia, chronic microvascular disease  are also affecting patient's functional outcome.    REHAB POTENTIAL: Good  CLINICAL DECISION MAKING: Evolving/moderate complexity  EVALUATION COMPLEXITY: Moderate   GOALS: Goals  reviewed with patient? Yes  SHORT TERM GOALS: Target date: 01/14/2022   Compliance with initial HEP Baseline: does not perform at home Goal status: IN PROGRESS   LONG TERM GOALS: Target date: 03/04/22    Compliant with progressed HEP Baseline: not compliant in past Goal status: IN PROGRESS - modified to [employer].   2.  Be able to ambulate 300' without seated rest break.  Baseline: 95' with 2 seated rest breaks Goal status: IN PROGRESS 02/18/22- 270' with SPC before seated rest break needed.   3.  Improve gait speed to >0.55 m/s for safety with ambulation Baseline: 0.47 m/s Goal status: MET - 0.31ms - maintained x 2 min   4.  Transition to community fitness program to maintain progress and engagement.  Baseline: information provided Goal status: IN PROGRESS 02/17/22- again given information on [facility] programs   PLAN: PT  FREQUENCY: 1x/week  PT DURATION: 6 weeks extended to 03/04/22  PLANNED INTERVENTIONS: Therapeutic exercises, Therapeutic activity, Neuromuscular re-education, Balance training, Gait training, Patient/Family education, Joint mobilization, and Stair training  PLAN FOR NEXT SESSION: continue to encourage more standing exercise/dance to improve endurance.   ERennie Natter PT, DPT  02/25/2022, 2:13 PM

## 2022-03-04 ENCOUNTER — Ambulatory Visit: Payer: Medicare Other | Admitting: Physical Therapy

## 2022-03-24 ENCOUNTER — Other Ambulatory Visit: Payer: Self-pay

## 2022-03-24 ENCOUNTER — Telehealth: Payer: Self-pay | Admitting: Physician Assistant

## 2022-03-24 DIAGNOSIS — R2681 Unsteadiness on feet: Secondary | ICD-10-CM

## 2022-03-24 NOTE — Telephone Encounter (Signed)
Patient said she needs to get her PT resch again

## 2022-03-24 NOTE — Telephone Encounter (Signed)
Order placed to [facility] for physical therapy

## 2022-03-27 ENCOUNTER — Other Ambulatory Visit: Payer: Self-pay | Admitting: Internal Medicine

## 2022-04-01 ENCOUNTER — Telehealth: Payer: Self-pay | Admitting: Physical Therapy

## 2022-04-01 ENCOUNTER — Ambulatory Visit: Payer: Medicare Other | Admitting: Physical Therapy

## 2022-04-01 NOTE — Telephone Encounter (Signed)
Called and spoke with Stacy Page daughter Stacy Page about continued PT for Stacy Page.  Stacy Page reports no change in her status and no new falls.  We discussed that Stacy Page does have an HEP (she was given OTAGO program to prevent falls in previous episode) and now has activities that she enjoys doing (she had been participating in dancing at PT), but at this time she does not require skilled services of PT, as care has become habilitative rather than rehabilitative.  Suggested that she contact [place] about in-home aide, as an aide would be able to do these activities with Stacy Page to improve her activity level.  Given number to call.  Daughter verbalized understanding.

## 2022-04-06 ENCOUNTER — Encounter: Payer: Medicare Other | Admitting: Physical Therapy

## 2022-04-08 ENCOUNTER — Encounter: Payer: Medicare Other | Admitting: Physical Therapy

## 2022-04-13 ENCOUNTER — Encounter: Payer: Medicare Other | Admitting: Physical Therapy

## 2022-04-22 ENCOUNTER — Encounter: Payer: Medicare Other | Admitting: Physical Therapy

## 2022-04-24 ENCOUNTER — Other Ambulatory Visit: Payer: Self-pay | Admitting: Internal Medicine

## 2022-05-20 ENCOUNTER — Ambulatory Visit: Payer: Medicare Other | Admitting: Physician Assistant

## 2022-05-20 ENCOUNTER — Encounter: Payer: Self-pay | Admitting: Physician Assistant

## 2022-05-20 DIAGNOSIS — Z029 Encounter for administrative examinations, unspecified: Secondary | ICD-10-CM

## 2022-05-27 ENCOUNTER — Ambulatory Visit: Payer: Medicare Other | Admitting: Physician Assistant

## 2022-05-27 ENCOUNTER — Other Ambulatory Visit: Payer: Self-pay | Admitting: Internal Medicine

## 2022-05-28 ENCOUNTER — Encounter: Payer: Self-pay | Admitting: Physician Assistant

## 2022-05-28 ENCOUNTER — Ambulatory Visit (INDEPENDENT_AMBULATORY_CARE_PROVIDER_SITE_OTHER): Payer: Medicare Other | Admitting: Physician Assistant

## 2022-05-28 DIAGNOSIS — F03B0 Unspecified dementia, moderate, without behavioral disturbance, psychotic disturbance, mood disturbance, and anxiety: Secondary | ICD-10-CM

## 2022-05-28 DIAGNOSIS — Z029 Encounter for administrative examinations, unspecified: Secondary | ICD-10-CM

## 2022-06-26 ENCOUNTER — Other Ambulatory Visit: Payer: Self-pay | Admitting: Internal Medicine

## 2022-06-29 ENCOUNTER — Other Ambulatory Visit: Payer: Self-pay

## 2022-06-29 ENCOUNTER — Telehealth: Payer: Self-pay

## 2022-06-29 MED ORDER — LOSARTAN POTASSIUM 100 MG PO TABS: 100.0000 mg | ORAL_TABLET | Freq: Every day | ORAL | 0 refills | Status: DC

## 2022-06-29 NOTE — Telephone Encounter (Signed)
Sent in refill for patient.

## 2022-06-29 NOTE — Telephone Encounter (Signed)
Called and scheduled pt for an office visit

## 2022-06-29 NOTE — Telephone Encounter (Signed)
Needs to schedule apt then can have 100 day no refills

## 2022-07-04 NOTE — Progress Notes (Signed)
No show

## 2022-07-08 ENCOUNTER — Ambulatory Visit (INDEPENDENT_AMBULATORY_CARE_PROVIDER_SITE_OTHER): Payer: Medicare Other | Admitting: Internal Medicine

## 2022-07-08 ENCOUNTER — Encounter: Payer: Self-pay | Admitting: Internal Medicine

## 2022-07-08 VITALS — BP 138/64 | HR 106 | Temp 98.3°F | Ht 59.0 in | Wt 133.0 lb

## 2022-07-08 DIAGNOSIS — I10 Essential (primary) hypertension: Secondary | ICD-10-CM | POA: Diagnosis not present

## 2022-07-08 DIAGNOSIS — K219 Gastro-esophageal reflux disease without esophagitis: Secondary | ICD-10-CM

## 2022-07-08 DIAGNOSIS — J4520 Mild intermittent asthma, uncomplicated: Secondary | ICD-10-CM

## 2022-07-08 DIAGNOSIS — E782 Mixed hyperlipidemia: Secondary | ICD-10-CM | POA: Diagnosis not present

## 2022-07-08 DIAGNOSIS — Z0000 Encounter for general adult medical examination without abnormal findings: Secondary | ICD-10-CM

## 2022-07-08 DIAGNOSIS — F03B0 Unspecified dementia, moderate, without behavioral disturbance, psychotic disturbance, mood disturbance, and anxiety: Secondary | ICD-10-CM

## 2022-07-08 DIAGNOSIS — F03918 Unspecified dementia, unspecified severity, with other behavioral disturbance: Secondary | ICD-10-CM | POA: Insufficient documentation

## 2022-07-08 LAB — COMPREHENSIVE METABOLIC PANEL
ALT: 24 U/L (ref 0–35)
AST: 25 U/L (ref 0–37)
Albumin: 3.9 g/dL (ref 3.5–5.2)
Alkaline Phosphatase: 103 U/L (ref 39–117)
BUN: 22 mg/dL (ref 6–23)
CO2: 29 mEq/L (ref 19–32)
Calcium: 9.4 mg/dL (ref 8.4–10.5)
Chloride: 103 mEq/L (ref 96–112)
Creatinine, Ser: 0.89 mg/dL (ref 0.40–1.20)
GFR: 58.33 mL/min — ABNORMAL LOW (ref >60.00)
Glucose, Bld: 87 mg/dL (ref 70–99)
Potassium: 3.4 mEq/L — ABNORMAL LOW (ref 3.5–5.1)
Sodium: 142 mEq/L (ref 135–145)
Total Bilirubin: 0.4 mg/dL (ref 0.2–1.2)
Total Protein: 7.3 g/dL (ref 6.0–8.3)

## 2022-07-08 LAB — CBC
HCT: 41.2 % (ref 36.0–46.0)
Hemoglobin: 13.7 g/dL (ref 12.0–15.0)
MCHC: 33.2 g/dL (ref 30.0–36.0)
MCV: 87.4 fl (ref 78.0–100.0)
Platelets: 220 10*3/uL (ref 150.0–400.0)
RBC: 4.71 Mil/uL (ref 3.87–5.11)
RDW: 14.2 % (ref 11.5–15.5)
WBC: 6.3 10*3/uL (ref 4.0–10.5)

## 2022-07-08 LAB — LIPID PANEL
Cholesterol: 123 mg/dL (ref 0–200)
HDL: 41.8 mg/dL (ref >39.00)
LDL Cholesterol: 67 mg/dL (ref 0–99)
NonHDL: 81.57
Total CHOL/HDL Ratio: 3
Triglycerides: 71 mg/dL (ref 0.0–149.0)
VLDL: 14.2 mg/dL (ref 0.0–40.0)

## 2022-07-08 MED ORDER — MIRTAZAPINE 7.5 MG PO TABS: 7.5000 mg | ORAL_TABLET | Freq: Every day | ORAL | 3 refills | Status: DC

## 2022-07-08 NOTE — Assessment & Plan Note (Signed)
Flu shot up to date. Covid-19 up to date. Pneumonia complete. Shingrix counseled. Tetanus counseled due. Colonoscopy aged out. Mammogram aged out, pap smear aged out and dexa complete. Counseled about sun safety and mole surveillance. Counseled about the dangers of distracted driving. Given 10 year screening recommendations.

## 2022-07-08 NOTE — Assessment & Plan Note (Signed)
Checking lipid panel. Adjust lipitor 40 mg daily as needed.

## 2022-07-08 NOTE — Assessment & Plan Note (Signed)
Taking aricept and namenda and overall slight progression from last year. Loss of appetite and rx remeron 7.5 mg qhs.

## 2022-07-08 NOTE — Assessment & Plan Note (Signed)
Uses albuterol prn only. No flare today or in last year.

## 2022-07-08 NOTE — Assessment & Plan Note (Signed)
BP at goal on hydralazine 100 mg TID and hctz 12.5 mg daily and losartan 100 mg daily. Checking CMP and adjust as needed.

## 2022-07-08 NOTE — Assessment & Plan Note (Signed)
Taking protonix 20 mg daily and well controlled.

## 2022-07-08 NOTE — Progress Notes (Signed)
Subjective:   Patient ID: Stacy Page, female    DOB: Dec 26, 1932, 88 y.o.   MRN: [id number]  HPI Here for medicare wellness and physical, no new complaints. Please see A/P for status and treatment of chronic medical problems.   Diet: heart healthy Physical activity: sedentary Depression/mood screen: negative Hearing: intact to whispered voice Visual acuity: grossly normal, performs annual eye exam  ADLs: capable Fall risk: none Home safety: good Cognitive evaluation: intact to orientation, naming, recall and repetition EOL planning: adv directives discussed, in place  [employer] Visit from 07/08/2022 in [place] at [place] Visit from 07/08/2022 in [place] at [facility]  PHQ-9 Total Score 0         01/29/2021    1:36 PM 01/29/2021    1:56 PM 05/21/2021    2:17 PM 11/17/2021    3:31 PM 07/08/2022   10:01 AM  [place] in the past year? 0 0 0 0 0  Was there an injury with Fall? 0 0 0 0 0  Fall Risk Category Calculator 0 0 0 0 0  Fall Risk Category Low Low Low Low Low  Patient Fall Risk Level Low fall risk Low fall risk Low fall risk Moderate fall risk Low fall risk  Patient at Risk for Falls Due to No Fall Risks No Fall Risks     Fall risk Follow up  Falls evaluation completed   Falls evaluation completed    I have personally reviewed and have noted 1. The patient's medical and social history - reviewed today no changes 2. Their use of alcohol, tobacco or illicit drugs 3. Their current medications and supplements 4. The patient's functional ability including ADL's, fall risks, home safety risks and hearing or visual impairment. 5. Diet and physical activities 6. Evidence for depression or mood disorders 7. Care team reviewed and updated 8.  The patient is not on an opioid pain medication  Patient Care Team: Hoyt Koch, MD as PCP - General (Internal  Medicine) Belva Crome, MD as PCP - Cardiology (Cardiology) Trula Slade, DPM as Consulting Physician (Podiatry) Cameron Sprang, MD as Consulting Physician (Neurology) Past Medical History:  Diagnosis Date   Allergy    seasonal   Asthma    GERD (gastroesophageal reflux disease)    Hyperlipidemia    Hypertension    Transient cerebral ischemia    Past Surgical History:  Procedure Laterality Date   ABDOMINAL HYSTERECTOMY     for fibroids (no BSO)   BUNIONECTOMY Right    CARPAL TUNNEL RELEASE     Bilaterally   COLONOSCOPY  2011   tiny polyp ; [facility]   Macular Degeneration     Dr Zadie Rhine  ; injections   UMBILICAL HERNIA REPAIR     Family History  Problem Relation Age of Onset   Alzheimer's disease Mother    Hypertension Mother    Stroke Mother 16   Hypertension Father    Heart attack Father        in 48's   Hypertension Maternal Grandmother    Heart attack Maternal Grandmother 73   Diabetes Other        1/2 sister   Colon cancer Neg Hx    Esophageal cancer Neg Hx    Pancreatic cancer Neg Hx    Kidney disease Neg Hx    Liver disease Neg Hx  Review of Systems  Unable to perform ROS: Dementia  Constitutional:  Positive for appetite change and fatigue.  HENT: Negative.    Eyes: Negative.   Respiratory:  Negative for cough, chest tightness and shortness of breath.   Cardiovascular:  Negative for chest pain, palpitations and leg swelling.  Gastrointestinal:  Negative for abdominal distention, abdominal pain, constipation, diarrhea, nausea and vomiting.  Musculoskeletal: Negative.   Skin: Negative.   Neurological: Negative.   Psychiatric/Behavioral: Negative.      Objective:  Physical Exam Constitutional:      Appearance: She is well-developed.  HENT:     Head: Normocephalic and atraumatic.  Cardiovascular:     Rate and Rhythm: Normal rate and regular rhythm.  Pulmonary:     Effort: Pulmonary effort is normal. No respiratory distress.     Breath  sounds: Normal breath sounds. No wheezing or rales.  Abdominal:     General: Bowel sounds are normal. There is no distension.     Palpations: Abdomen is soft.     Tenderness: There is no abdominal tenderness. There is no rebound.  Musculoskeletal:     Cervical back: Normal range of motion.  Skin:    General: Skin is warm and dry.  Neurological:     Mental Status: She is alert and oriented to person, place, and time.     Coordination: Coordination normal.     Vitals:   07/08/22 0951  BP: 138/64  Pulse: (!) 106  Temp: 98.3 F (36.8 C)  TempSrc: Oral  SpO2: 98%  Weight: 133 lb (60.3 kg)  Height: '4\' 11"'$  (1.499 m)    Assessment & Plan:

## 2022-07-08 NOTE — Patient Instructions (Addendum)
We have sent in the remeron to take at night time which should help increase appetite. This can take 2-4 weeks to work.

## 2022-07-15 ENCOUNTER — Other Ambulatory Visit: Payer: Self-pay | Admitting: Internal Medicine

## 2022-07-21 ENCOUNTER — Other Ambulatory Visit: Payer: Self-pay | Admitting: Internal Medicine

## 2022-08-19 ENCOUNTER — Telehealth: Payer: Self-pay | Admitting: Internal Medicine

## 2022-08-19 ENCOUNTER — Other Ambulatory Visit: Payer: Self-pay

## 2022-08-19 MED ORDER — HYDRALAZINE HCL 100 MG PO TABS: 100.0000 mg | ORAL_TABLET | Freq: Three times a day (TID) | ORAL | 2 refills | Status: DC

## 2022-08-19 NOTE — Telephone Encounter (Signed)
Caller & Relationship to patient:  Juliann Pulse - companion   Call back number: [phone]   Date of last office visit:  07/08/2022   Date of next office visit:   Medication(s) to be refilled:   Hydralazine 100 mg.       Preferred Pharmacy:   [facility] on [address]

## 2022-08-19 NOTE — Telephone Encounter (Signed)
Refill has been sent in.  

## 2022-10-26 ENCOUNTER — Encounter: Payer: Self-pay | Admitting: Physician Assistant

## 2022-12-14 ENCOUNTER — Other Ambulatory Visit: Payer: Self-pay | Admitting: Physician Assistant

## 2022-12-27 ENCOUNTER — Ambulatory Visit (INDEPENDENT_AMBULATORY_CARE_PROVIDER_SITE_OTHER): Payer: Medicare Other | Admitting: Physician Assistant

## 2022-12-27 ENCOUNTER — Encounter: Payer: Self-pay | Admitting: Physician Assistant

## 2022-12-27 VITALS — BP 176/78 | HR 74 | Resp 20 | Ht 59.0 in | Wt 144.0 lb

## 2022-12-27 DIAGNOSIS — F03918 Unspecified dementia, unspecified severity, with other behavioral disturbance: Secondary | ICD-10-CM | POA: Diagnosis not present

## 2022-12-27 MED ORDER — DONEPEZIL HCL 10 MG PO TABS: ORAL_TABLET | ORAL | 3 refills | Status: DC

## 2022-12-27 MED ORDER — MEMANTINE HCL 5 MG PO TABS: ORAL_TABLET | ORAL | 3 refills | Status: DC

## 2022-12-27 NOTE — Patient Instructions (Addendum)
Good to see you!  Continue Donepezil 10mg  daily.  Continue memantine 5 mg daily      Encourage increasing activity, including joining a day program.  Follow-up in 6 months, call for any changes   FALL PRECAUTIONS: Be cautious when walking. Scan the area for obstacles that may increase the risk of trips and falls. When getting up in the mornings, sit up at the edge of the bed for a few minutes before getting out of bed. Consider elevating the bed at the head end to avoid drop of blood pressure when getting up. Walk always in a well-lit room (use night lights in the walls). Avoid area rugs or power cords from appliances in the middle of the walkways. Use a walker or a cane if necessary and consider physical therapy for balance exercise. Get your eyesight checked regularly.  HOME SAFETY: Consider the safety of the kitchen when operating appliances like stoves, microwave oven, and blender. Consider having supervision and share cooking responsibilities until no longer able to participate in those. Accidents with firearms and other hazards in the house should be identified and addressed as well.   ABILITY TO BE LEFT ALONE: If patient is unable to contact 911 operator, consider using LifeLine, or when the need is there, arrange for someone to stay with patients. Smoking is a fire hazard, consider supervision or cessation. Risk of wandering should be assessed by caregiver and if detected at any point, supervision and safe proof recommendations should be instituted.  RECOMMENDATIONS FOR ALL PATIENTS WITH MEMORY PROBLEMS: 1. Continue to exercise (Recommend 30 minutes of walking everyday, or 3 hours every week) 2. Increase social interactions - continue going to [place] and enjoy social gatherings with friends and family 3. Eat healthy, avoid fried foods and eat more fruits and vegetables 4. Maintain adequate blood pressure, blood sugar, and blood cholesterol level. Reducing the risk of stroke and  cardiovascular disease also helps promoting better memory. 5. Avoid stressful situations. Live a simple life and avoid aggravations. Organize your time and prepare for the next day in anticipation. 6. Sleep well, avoid any interruptions of sleep and avoid any distractions in the bedroom that may interfere with adequate sleep quality 7. Avoid sugar, avoid sweets as there is a strong link between excessive sugar intake, diabetes, and cognitive impairment The Mediterranean diet has been shown to help patients reduce the risk of progressive memory disorders and reduces cardiovascular risk. This includes eating fish, eat fruits and green leafy vegetables, nuts like almonds and hazelnuts, walnuts, and also use olive oil. Avoid fast foods and fried foods as much as possible. Avoid sweets and sugar as sugar use has been linked to worsening of memory function.  There is always a concern of gradual progression of memory problems. If this is the case, then we may need to adjust level of care according to patient needs. Support, both to the patient and caregiver, should then be put into place.
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
## 2023-02-08 ENCOUNTER — Other Ambulatory Visit: Payer: Self-pay | Admitting: Physician Assistant

## 2023-02-15 ENCOUNTER — Ambulatory Visit: Payer: Medicare Other | Admitting: Internal Medicine

## 2023-02-15 ENCOUNTER — Other Ambulatory Visit: Payer: Self-pay

## 2023-02-15 MED ORDER — LEVOCETIRIZINE DIHYDROCHLORIDE 5 MG PO TABS: ORAL_TABLET | ORAL | 3 refills | Status: DC

## 2023-03-09 ENCOUNTER — Telehealth: Payer: Self-pay | Admitting: Internal Medicine

## 2023-03-09 DIAGNOSIS — M1991 Primary osteoarthritis, unspecified site: Secondary | ICD-10-CM

## 2023-03-09 NOTE — Telephone Encounter (Signed)
Pt daughter called wanting a Rx for a walker with seat. Pt daughter states she can't walk long periods of time she have to take  breaks. Please advise.

## 2023-03-09 NOTE — Telephone Encounter (Signed)
MD is out of the office until Friday will forward to her desk top to review.Marland KitchenShearon Stalls

## 2023-03-14 NOTE — Telephone Encounter (Signed)
Order placed route to adapt please.

## 2023-03-14 NOTE — Telephone Encounter (Addendum)
Notified pt daughter rx for walker has been faxed to [phone]...Raechel Chute

## 2023-03-18 DIAGNOSIS — M150 Primary generalized (osteo)arthritis: Secondary | ICD-10-CM | POA: Diagnosis not present

## 2023-03-23 ENCOUNTER — Other Ambulatory Visit: Payer: Self-pay | Admitting: Internal Medicine

## 2023-03-29 ENCOUNTER — Other Ambulatory Visit: Payer: Self-pay | Admitting: Internal Medicine

## 2023-05-23 ENCOUNTER — Other Ambulatory Visit: Payer: Self-pay | Admitting: Internal Medicine

## 2023-05-24 ENCOUNTER — Ambulatory Visit: Payer: Medicare Other

## 2023-05-31 ENCOUNTER — Ambulatory Visit (INDEPENDENT_AMBULATORY_CARE_PROVIDER_SITE_OTHER): Payer: Medicare Other

## 2023-05-31 VITALS — Ht 59.0 in | Wt 153.0 lb

## 2023-05-31 DIAGNOSIS — Z0000 Encounter for general adult medical examination without abnormal findings: Secondary | ICD-10-CM

## 2023-05-31 DIAGNOSIS — Z0100 Encounter for examination of eyes and vision without abnormal findings: Secondary | ICD-10-CM

## 2023-05-31 NOTE — Progress Notes (Signed)
Subjective:   Stacy Page is a 89 y.o. female who presents for Medicare Annual (Subsequent) preventive examination.  Visit Complete: Virtual I connected with  Leighton Roach on 05/31/23 by a audio enabled telemedicine application and verified that I am speaking with the correct person using two identifiers.  Patient Location: Home  Provider Location: Office/Clinic  I discussed the limitations of evaluation and management by telemedicine. The patient expressed understanding and agreed to proceed.  Vital Signs: Because this visit was a virtual/telehealth visit, some criteria may be missing or patient reported. Any vitals not documented were not able to be obtained and vitals that have been documented are patient reported.  Patient's daughter assisted with this exam.  Cardiac Risk Factors include: advanced age (>35men, >55 women);dyslipidemia;family history of premature cardiovascular disease;hypertension;sedentary lifestyle     Objective:    Today's Vitals   05/31/23 1432  Weight: 153 lb (69.4 kg)  Height: 4\' 11"  (1.499 m)  PainSc: 0-No pain   Body mass index is 30.9 kg/m.     05/31/2023    2:34 PM 12/27/2022    3:13 PM 12/31/2021    3:02 PM 11/17/2021    3:33 PM 08/05/2021    2:35 PM 05/21/2021    2:17 PM 01/29/2021    1:35 PM  Advanced Directives  Does Patient Have a Medical Advance Directive? No Yes Yes Yes Yes Yes Yes  Type of Cytogeneticist of [employer] Power of [place];Living will  Does patient want to make changes to medical advance directive?  No - Patient declined No - Patient declined  No - Patient declined  No - Patient declined  Copy of Healthcare Power of Attorney in Chart?   No - copy requested    No - copy requested  Would patient like information on creating a medical advance directive? Yes (MAU/Ambulatory/Procedural Areas - Information given)          Current Medications (verified) Outpatient  Encounter Medications as of 05/31/2023  Medication Sig   acetaminophen (TYLENOL) 500 MG tablet Take 500 mg by mouth every 6 (six) hours as needed.    albuterol (PROVENTIL HFA;VENTOLIN HFA) 108 (90 Base) MCG/ACT inhaler Inhale 1-2 puffs into the lungs every 4 (four) hours as needed for wheezing or shortness of breath. (Patient not taking: Reported on 07/08/2022)   atorvastatin (LIPITOR) 40 MG tablet TAKE 1 TABLET(40 MG) BY MOUTH DAILY   azelastine (ASTELIN) 0.1 % nasal spray Place 2 sprays into both nostrils at bedtime as needed. (allergies)   clopidogrel (PLAVIX) 75 MG tablet Take 1 tablet (75 mg total) by mouth daily. Annual appt due in Nov must see provider for future refills   cycloSPORINE (RESTASIS) 0.05 % ophthalmic emulsion Place 1 drop into both eyes 2 (two) times daily. (Patient not taking: Reported on 07/08/2022)   donepezil (ARICEPT) 10 MG tablet TAKE 1 TABLET BY MOUTH DAILY   feeding supplement, ENSURE ENLIVE, (ENSURE ENLIVE) LIQD Take 237 mLs by mouth 2 (two) times daily between meals.   fluticasone (FLONASE) 50 MCG/ACT nasal spray Place 2 sprays into both nostrils daily as needed for allergies or rhinitis.   gabapentin (NEURONTIN) 100 MG capsule Take 1 capsule (100 mg total) by mouth 3 (three) times daily as needed (leg and lower back pain).   hydrALAZINE (APRESOLINE) 100 MG tablet Take 1 tablet (100 mg total) by mouth 3 (three) times daily.   hydrochlorothiazide (HYDRODIURIL) 12.5 MG tablet TAKE 1 TABLET(12.5 MG)  BY MOUTH DAILY   hydrocortisone 2.5 % cream Apply topically 2 (two) times daily.   levocetirizine (XYZAL) 5 MG tablet TAKE 1 TABLET BY MOUTH EVERY EVENING.   losartan (COZAAR) 100 MG tablet TAKE 1 TABLET(100 MG) BY MOUTH DAILY   meclizine (ANTIVERT) 25 MG tablet TAKE 1/2 TO 1 TABLET BY  MOUTH 3 TIMES DAILY AS  NEEDED FOR DIZZINESS OR  NAUSEA (Patient not taking: Reported on 07/08/2022)   memantine (NAMENDA) 5 MG tablet take 1 tablet (5 mg) twice a day   mirtazapine (REMERON)  7.5 MG tablet Take 1 tablet (7.5 mg total) by mouth at bedtime.   montelukast (SINGULAIR) 10 MG tablet Take 1 tablet (10 mg total) by mouth at bedtime. Annual appt due in Nov must see provider for future refills   pantoprazole (PROTONIX) 20 MG tablet TAKE 1 TABLET BY MOUTH  DAILY   potassium chloride SA (KLOR-CON) 20 MEQ tablet Take 1 tablet (20 mEq total) by mouth daily.   triamcinolone (KENALOG) 0.1 % APPLY ONE APPLICATION TOPICALLY TWICE DAILY   No facility-administered encounter medications on file as of 05/31/2023.    Allergies (verified) Bystolic [nebivolol hcl], Aspirin, Cardizem [diltiazem hcl], Amlodipine, Metoclopramide hcl, and Spironolactone   History: Past Medical History:  Diagnosis Date   Allergy    seasonal   Asthma    GERD (gastroesophageal reflux disease)    Hyperlipidemia    Hypertension    Transient cerebral ischemia    Past Surgical History:  Procedure Laterality Date   ABDOMINAL HYSTERECTOMY     for fibroids (no BSO)   BUNIONECTOMY Right    CARPAL TUNNEL RELEASE     Bilaterally   COLONOSCOPY  2011   tiny polyp ; [facility]   Macular Degeneration     Dr Luciana Axe  ; injections   UMBILICAL HERNIA REPAIR     Family History  Problem Relation Age of Onset   Alzheimer's disease Mother    Hypertension Mother    Stroke Mother 29   Hypertension Father    Heart attack Father        in 76's   Hypertension Maternal Grandmother    Heart attack Maternal Grandmother 62   Diabetes Other        1/2 sister   Colon cancer Neg Hx    Esophageal cancer Neg Hx    Pancreatic cancer Neg Hx    Kidney disease Neg Hx    Liver disease Neg Hx    Social History   Socioeconomic History   Marital status: Divorced    Spouse name: Not on file   Number of children: 2   Years of education: Not on file   Highest education level: Not on file  Occupational History   Not on file  Tobacco Use   Smoking status: Former    Current packs/day: 0.00    Types: Cigarettes    Quit  date: 08/24/1979    Years since quitting: 43.7   Smokeless tobacco: Never   Tobacco comments:    smoked  1966-1981, up to 1 ppd  Vaping Use   Vaping status: Never Used  Substance and Sexual Activity   Alcohol use: No    Alcohol/week: 0.0 standard drinks of alcohol   Drug use: No   Sexual activity: Never  Other Topics Concern   Not on file  Social History Narrative   Right handed    One level apartment    No Caffeine   Social Determinants of Health  Financial Resource Strain: Low Risk  (05/31/2023)   Overall Financial Resource Strain (CARDIA)    Difficulty of Paying Living Expenses: Not hard at all  Food Insecurity: No Food Insecurity (05/31/2023)   Hunger Vital Sign    Worried About Running Out of Food in the Last Year: Never true    Ran Out of Food in the Last Year: Never true  Transportation Needs: No Transportation Needs (05/31/2023)   PRAPARE - Administrator, Civil Service (Medical): No    Lack of Transportation (Non-Medical): No  Physical Activity: Inactive (05/31/2023)   Exercise Vital Sign    Days of Exercise per Week: 0 days    Minutes of Exercise per Session: 0 min  Stress: No Stress Concern Present (05/31/2023)   [employer] of Occupational Health - Occupational Stress Questionnaire    Feeling of Stress : Not at all  Social Connections: Moderately Integrated (05/31/2023)   Social Connection and Isolation Panel [NHANES]    Frequency of Communication with Friends and Family: More than three times a week    Frequency of Social Gatherings with Friends and Family: More than three times a week    Attends Religious Services: More than 4 times per year    Active Member of [employer] or Organizations: No    Attends Engineer, structural: More than 4 times per year    Marital Status: Widowed    Tobacco Counseling Counseling given: Not Answered Tobacco comments: smoked  [phone], up to 1 ppd   Clinical Intake:  Pre-visit preparation completed:  Yes  Pain : No/denies pain Pain Score: 0-No pain     BMI - recorded: 30.9 Nutritional Status: BMI > 30  Obese Nutritional Risks: None Diabetes: No  How often do you need to have someone help you when you read instructions, pamphlets, or other written materials from your doctor or pharmacy?: 1 - Never What is the last grade level you completed in school?: HSG; CNA  Interpreter Needed?: No  Information entered by :: Sherlin Sonier N. Shazia Mitchener, LPN.   Activities of Daily Living    05/31/2023    2:36 PM  In your present state of health, do you have any difficulty performing the following activities:  Hearing? 0  Vision? 0  Difficulty concentrating or making decisions? 1  Walking or climbing stairs? 0  Dressing or bathing? 0  Doing errands, shopping? 1  Preparing Food and eating ? N  Using the Toilet? N  In the past six months, have you accidently leaked urine? Y  Comment wear Depends for protection  Do you have problems with loss of bowel control? N  Managing your Medications? Y  Managing your Finances? Y  Housekeeping or managing your Housekeeping? Y    Patient Care Team: Myrlene Broker, MD as PCP - General (Internal Medicine) Lyn Records, MD (Inactive) as PCP - Cardiology (Cardiology) Vivi Barrack, DPM as Consulting Physician (Podiatry) Karel Jarvis Lesle Chris, MD as Consulting Physician (Neurology)  Indicate any recent Medical Services you may have received from other than [facility] providers in the past year (date may be approximate).     Assessment:   This is a routine wellness examination for Stacy Page.  Hearing/Vision screen Hearing Screening - Comments:: Patient denied any hearing difficulty.   No hearing aids.  Vision Screening - Comments:: Patient does wear corrective lenses/contacts.  Annual eye exam done by: Need new eye doctor    Goals Addressed  This Visit's Progress    Client understands the importance of follow-up with providers by  attending scheduled visits.        Depression Screen    05/31/2023    2:47 PM 07/08/2022   10:01 AM 01/29/2021    1:36 PM 01/17/2020    2:08 PM 05/23/2018   11:47 AM 05/17/2017   11:37 AM 04/07/2016   11:29 AM  PHQ 2/9 Scores  PHQ - 2 Score 0 0 0 0 0 0 0  PHQ- 9 Score 0 0    0     Fall Risk    05/31/2023    2:36 PM 12/27/2022    3:13 PM 07/08/2022   10:01 AM 11/17/2021    3:31 PM 05/21/2021    2:17 PM  Fall Risk   Falls in the past year? 0 0 0 0 0  Number falls in past yr: 0 0 0 0 0  Injury with Fall? 0 0 0 0 0  Risk for fall due to : No Fall Risks      Follow up Falls prevention discussed Falls evaluation completed Falls evaluation completed      MEDICARE RISK AT HOME: Medicare Risk at Home Any stairs in or around the home?: No If so, are there any without handrails?: No Home free of loose throw rugs in walkways, pet beds, electrical cords, etc?: Yes Adequate lighting in your home to reduce risk of falls?: Yes Life alert?: No Use of a cane, walker or w/c?: No Grab bars in the bathroom?: Yes Shower chair or bench in shower?: Yes Elevated toilet seat or a handicapped toilet?: No  TIMED UP AND GO:  Was the test performed?  No    Cognitive Function:    05/31/2023    2:38 PM 12/27/2022    3:00 PM 11/17/2021    8:00 PM 11/17/2020    5:00 PM 03/20/2020    1:00 PM  MMSE - Mini Mental State Exam  Not completed: Unable to complete      Orientation to time  4 0 4 3  Orientation to Place  4 5 5 5   Registration  3 3 3 3   Attention/ Calculation  5 5 5 3   Recall  0 0 2 0  Language- name 2 objects  2 2 2 2   Language- repeat  1 1 1 1   Language- follow 3 step command  3 3 3 3   Language- read & follow direction  1 1 1 1   Write a sentence  1 1 1 1   Copy design  0 0 0 1  Total score  24 21 27 23       02/14/2019    3:00 PM  Montreal Cognitive Assessment   Visuospatial/ Executive (0/5) 4  Naming (0/3) 3  Attention: Read list of digits (0/2) 1  Attention: Read list of letters  (0/1) 1  Attention: Serial 7 subtraction starting at 100 (0/3) 1  Language: Repeat phrase (0/2) 2  Language : Fluency (0/1) 0  Abstraction (0/2) 0  Delayed Recall (0/5) 0  Orientation (0/6) 4  Total 16  Adjusted Score (based on education) 17      Immunizations Immunization History  Administered Date(s) Administered   Influenza Split 04/23/2012   Influenza Whole 06/14/2007, 05/23/2008, 05/26/2009, 05/13/2010   Influenza, High Dose Seasonal PF 05/12/2016, 05/17/2017, 05/23/2018, 05/19/2019, 05/29/2022   Influenza-Unspecified 04/24/2014, 05/24/2015   PFIZER Comirnaty(Gray Top)Covid-19 Tri-Sucrose Vaccine 05/29/2022   PFIZER(Purple Top)SARS-COV-2 Vaccination 10/08/2019, 10/30/2019, 07/01/2020   Pneumococcal  Conjugate-13 04/24/2014   Pneumococcal Polysaccharide-23 02/17/2016   Tdap 11/16/2010   Zoster, Live 12/17/2012    TDAP status: Due, Education has been provided regarding the importance of this vaccine. Advised may receive this vaccine at local pharmacy or Health Dept. Aware to provide a copy of the vaccination record if obtained from local pharmacy or Health Dept. Verbalized acceptance and understanding.  Flu Vaccine status: Due, Education has been provided regarding the importance of this vaccine. Advised may receive this vaccine at local pharmacy or Health Dept. Aware to provide a copy of the vaccination record if obtained from local pharmacy or Health Dept. Verbalized acceptance and understanding.  Pneumococcal vaccine status: Up to date  Covid-19 vaccine status: Information provided on how to obtain vaccines.   Qualifies for Shingles Vaccine? Yes   Zostavax completed Yes   Shingrix Completed?: No.    Education has been provided regarding the importance of this vaccine. Patient has been advised to call insurance company to determine out of pocket expense if they have not yet received this vaccine. Advised may also receive vaccine at local pharmacy or Health Dept. Verbalized  acceptance and understanding.  Screening Tests Health Maintenance  Topic Date Due   Zoster Vaccines- Shingrix (1 of 2) 03/08/1985   DTaP/Tdap/Td (2 - Td or Tdap) 11/15/2020   INFLUENZA VACCINE  03/24/2023   COVID-19 Vaccine (5 - 2023-24 season) 04/24/2023   Medicare Annual Wellness (AWV)  05/30/2024   Pneumonia Vaccine 32+ Years old  Completed   DEXA SCAN  Completed   HPV VACCINES  Aged Out    Health Maintenance  Health Maintenance Due  Topic Date Due   Zoster Vaccines- Shingrix (1 of 2) 03/08/1985   DTaP/Tdap/Td (2 - Td or Tdap) 11/15/2020   INFLUENZA VACCINE  03/24/2023   COVID-19 Vaccine (5 - 2023-24 season) 04/24/2023    Colorectal cancer screening: No longer required.   Mammogram status: No longer required due to age.  Bone density status: No longer required due to age.  Lung Cancer Screening: (Low Dose CT Chest recommended if Age 105-80 years, 20 pack-year currently smoking OR have quit w/in 15years.) does not qualify.   Lung Cancer Screening Referral: no  Additional Screening:  Hepatitis C Screening: does not qualify; Completed: No  Vision Screening: Recommended annual ophthalmology exams for early detection of glaucoma and other disorders of the eye. Is the patient up to date with their annual eye exam?  No  Who is the provider or what is the name of the office in which the patient attends annual eye exams? Referral placed for optometrist If pt is not established with a provider, would they like to be referred to a provider to establish care? No .   Dental Screening: Recommended annual dental exams for proper oral hygiene  Diabetic Foot Exam: N/A  Community Resource Referral / Chronic Care Management: CRR required this visit?  No   CCM required this visit?  No     Plan:     I have personally reviewed and noted the following in the patient's chart:   Medical and social history Use of alcohol, tobacco or illicit drugs  Current medications and  supplements including opioid prescriptions. Patient is not currently taking opioid prescriptions. Functional ability and status Nutritional status Physical activity Advanced directives List of other physicians Hospitalizations, surgeries, and ER visits in previous 12 months Vitals Screenings to include cognitive, depression, and falls Referrals and appointments  In addition, I have reviewed and discussed with patient certain  preventive protocols, quality metrics, and best practice recommendations. A written personalized care plan for preventive services as well as general preventive health recommendations were provided to patient.     Mickeal Needy, LPN   [id number]   After Visit Summary: (Mail) Due to this being a telephonic visit, the after visit summary with patients personalized plan was offered to patient via mail   Nurse Notes: Cognitive status assessed by direct observation. Patient has current diagnosis of cognitive impairment. Patient is followed by neurology for ongoing assessment. Patient is unable to complete screening 6CIT or MMSE. Patient's daughter assisted with this appointment.

## 2023-05-31 NOTE — Patient Instructions (Signed)
Stacy Page , Thank you for taking time to come for your Medicare Wellness Visit. I appreciate your ongoing commitment to your health goals. Please review the following plan we discussed and let me know if I can assist you in the future.   Referrals/Orders/Follow-Ups/Clinician Recommendations: Yes; Referral placed to [facility], [address], [place], [place] [address] [phone] for routine eye exam.  This is a list of the screening recommended for you and due dates:  Health Maintenance  Topic Date Due   Zoster (Shingles) Vaccine (1 of 2) 03/08/1985   DTaP/Tdap/Td vaccine (2 - Td or Tdap) 11/15/2020   Flu Shot  03/24/2023   COVID-19 Vaccine (5 - 2023-24 season) 04/24/2023   Medicare Annual Wellness Visit  05/30/2024   Pneumonia Vaccine  Completed   DEXA scan (bone density measurement)  Completed   HPV Vaccine  Aged Out    Advanced directives: (Provided) Advance directive discussed with you today. I have provided a copy for you to complete at home and have notarized. Once this is complete, please bring a copy in to our office so we can scan it into your chart.   Next Medicare Annual Wellness Visit scheduled for next year: Yes

## 2023-06-07 ENCOUNTER — Other Ambulatory Visit: Payer: Self-pay | Admitting: Internal Medicine

## 2023-06-23 ENCOUNTER — Other Ambulatory Visit: Payer: Self-pay | Admitting: Internal Medicine

## 2023-06-24 ENCOUNTER — Telehealth: Payer: Self-pay | Admitting: Internal Medicine

## 2023-06-24 NOTE — Telephone Encounter (Signed)
Patient has been scheduled for an appointment on 07/04/2023.   Prescription Request  06/24/2023  LOV: 07/08/2022  What is the name of the medication or equipment? hydrALAZINE (APRESOLINE) 100 MG tablet   Have you contacted your pharmacy to request a refill? Yes   Which pharmacy would you like this sent to?  [id number] - [place], [place] - [address] [place] [address] Phone: [phone] Fax: [phone]   Patient notified that their request is being sent to the clinical staff for review and that they should receive a response within 2 business days.   Please advise at Mobile [phone] (mobile)

## 2023-06-25 ENCOUNTER — Other Ambulatory Visit: Payer: Self-pay | Admitting: Internal Medicine

## 2023-06-26 ENCOUNTER — Other Ambulatory Visit: Payer: Self-pay | Admitting: Internal Medicine

## 2023-06-27 ENCOUNTER — Other Ambulatory Visit: Payer: Self-pay | Admitting: Internal Medicine

## 2023-06-27 ENCOUNTER — Other Ambulatory Visit: Payer: Self-pay

## 2023-06-27 MED ORDER — HYDRALAZINE HCL 100 MG PO TABS: 100.0000 mg | ORAL_TABLET | Freq: Three times a day (TID) | ORAL | 0 refills | Status: DC

## 2023-06-29 ENCOUNTER — Ambulatory Visit: Payer: Medicare Other | Admitting: Physician Assistant

## 2023-07-04 ENCOUNTER — Ambulatory Visit: Payer: Medicare Other | Admitting: Internal Medicine

## 2023-07-04 ENCOUNTER — Encounter: Payer: Self-pay | Admitting: Internal Medicine

## 2023-07-04 VITALS — BP 143/68 | HR 99 | Temp 98.2°F | Ht 59.0 in | Wt 153.0 lb

## 2023-07-04 DIAGNOSIS — I10 Essential (primary) hypertension: Secondary | ICD-10-CM

## 2023-07-04 DIAGNOSIS — E782 Mixed hyperlipidemia: Secondary | ICD-10-CM | POA: Diagnosis not present

## 2023-07-04 DIAGNOSIS — J4520 Mild intermittent asthma, uncomplicated: Secondary | ICD-10-CM | POA: Diagnosis not present

## 2023-07-04 DIAGNOSIS — F03918 Unspecified dementia, unspecified severity, with other behavioral disturbance: Secondary | ICD-10-CM

## 2023-07-04 DIAGNOSIS — Z0000 Encounter for general adult medical examination without abnormal findings: Secondary | ICD-10-CM

## 2023-07-04 LAB — LIPID PANEL
Cholesterol: 145 mg/dL (ref 0–200)
HDL: 53.5 mg/dL (ref >39.00)
LDL Cholesterol: 74 mg/dL (ref 0–99)
NonHDL: 91.96
Total CHOL/HDL Ratio: 3
Triglycerides: 92 mg/dL (ref 0.0–149.0)
VLDL: 18.4 mg/dL (ref 0.0–40.0)

## 2023-07-04 LAB — COMPREHENSIVE METABOLIC PANEL
ALT: 18 U/L (ref 0–35)
AST: 25 U/L (ref 0–37)
Albumin: 4.1 g/dL (ref 3.5–5.2)
Alkaline Phosphatase: 119 U/L — ABNORMAL HIGH (ref 39–117)
BUN: 23 mg/dL (ref 6–23)
CO2: 30 meq/L (ref 19–32)
Calcium: 10 mg/dL (ref 8.4–10.5)
Chloride: 100 meq/L (ref 96–112)
Creatinine, Ser: 1.2 mg/dL (ref 0.40–1.20)
GFR: 40.47 mL/min — ABNORMAL LOW (ref >60.00)
Glucose, Bld: 97 mg/dL (ref 70–99)
Potassium: 3.8 meq/L (ref 3.5–5.1)
Sodium: 139 meq/L (ref 135–145)
Total Bilirubin: 0.4 mg/dL (ref 0.2–1.2)
Total Protein: 7.8 g/dL (ref 6.0–8.3)

## 2023-07-04 LAB — CBC
HCT: 41 % (ref 36.0–46.0)
Hemoglobin: 13.8 g/dL (ref 12.0–15.0)
MCHC: 33.7 g/dL (ref 30.0–36.0)
MCV: 88.4 fL (ref 78.0–100.0)
Platelets: 200 10*3/uL (ref 150.0–400.0)
RBC: 4.64 Mil/uL (ref 3.87–5.11)
RDW: 14.7 % (ref 11.5–15.5)
WBC: 5.8 10*3/uL (ref 4.0–10.5)

## 2023-07-04 MED ORDER — PANTOPRAZOLE SODIUM 20 MG PO TBEC: 20.0000 mg | DELAYED_RELEASE_TABLET | Freq: Every day | ORAL | 3 refills | Status: AC

## 2023-07-04 MED ORDER — HYDROCORTISONE 2.5 % EX CREA: TOPICAL_CREAM | Freq: Two times a day (BID) | CUTANEOUS | 3 refills | Status: AC

## 2023-07-04 MED ORDER — MECLIZINE HCL 25 MG PO TABS: ORAL_TABLET | ORAL | 0 refills | Status: AC

## 2023-07-04 MED ORDER — ATORVASTATIN CALCIUM 40 MG PO TABS: 40.0000 mg | ORAL_TABLET | Freq: Every day | ORAL | 3 refills | Status: DC

## 2023-07-04 MED ORDER — GABAPENTIN 100 MG PO CAPS: 100.0000 mg | ORAL_CAPSULE | Freq: Three times a day (TID) | ORAL | 3 refills | Status: AC | PRN

## 2023-07-04 MED ORDER — MONTELUKAST SODIUM 10 MG PO TABS: 10.0000 mg | ORAL_TABLET | Freq: Every day | ORAL | 3 refills | Status: DC

## 2023-07-04 MED ORDER — LOSARTAN POTASSIUM 100 MG PO TABS: 100.0000 mg | ORAL_TABLET | Freq: Every day | ORAL | 3 refills | Status: DC

## 2023-07-04 MED ORDER — CLOPIDOGREL BISULFATE 75 MG PO TABS: 75.0000 mg | ORAL_TABLET | Freq: Every day | ORAL | 3 refills | Status: DC

## 2023-07-04 MED ORDER — HYDRALAZINE HCL 100 MG PO TABS: 100.0000 mg | ORAL_TABLET | Freq: Three times a day (TID) | ORAL | 3 refills | Status: DC

## 2023-07-04 MED ORDER — HYDROCHLOROTHIAZIDE 12.5 MG PO TABS: 12.5000 mg | ORAL_TABLET | Freq: Every day | ORAL | 3 refills | Status: AC

## 2023-07-04 MED ORDER — MIRTAZAPINE 7.5 MG PO TABS: 7.5000 mg | ORAL_TABLET | Freq: Every day | ORAL | 3 refills | Status: DC

## 2023-07-04 MED ORDER — POTASSIUM CHLORIDE CRYS ER 20 MEQ PO TBCR: 20.0000 meq | EXTENDED_RELEASE_TABLET | Freq: Every day | ORAL | 3 refills | Status: AC

## 2023-07-04 MED ORDER — LEVOCETIRIZINE DIHYDROCHLORIDE 5 MG PO TABS: ORAL_TABLET | ORAL | 3 refills | Status: DC

## 2023-07-04 MED ORDER — TRIAMCINOLONE ACETONIDE 0.1 % EX CREA: TOPICAL_CREAM | Freq: Two times a day (BID) | CUTANEOUS | 2 refills | Status: AC

## 2023-07-04 NOTE — Progress Notes (Unsigned)
   Subjective:   Patient ID: Stacy Page, female    DOB: 1932-10-08, 89 y.o.   MRN: [id number]  HPI The patient is an 89 YO female coming in for physical.   PMH, FMH, social history reviewed and updated  Review of Systems  Constitutional: Negative.   HENT: Negative.    Eyes: Negative.   Respiratory:  Negative for cough, chest tightness and shortness of breath.   Cardiovascular:  Negative for chest pain, palpitations and leg swelling.  Gastrointestinal:  Negative for abdominal distention, abdominal pain, constipation, diarrhea, nausea and vomiting.  Musculoskeletal:  Positive for arthralgias.  Skin: Negative.   Neurological: Negative.   Psychiatric/Behavioral: Negative.      Objective:  Physical Exam Constitutional:      Appearance: She is well-developed.  HENT:     Head: Normocephalic and atraumatic.  Cardiovascular:     Rate and Rhythm: Normal rate and regular rhythm.  Pulmonary:     Effort: Pulmonary effort is normal. No respiratory distress.     Breath sounds: Normal breath sounds. No wheezing or rales.  Abdominal:     General: Bowel sounds are normal. There is no distension.     Palpations: Abdomen is soft.     Tenderness: There is no abdominal tenderness. There is no rebound.  Musculoskeletal:        General: Tenderness present.     Cervical back: Normal range of motion.  Skin:    General: Skin is warm and dry.  Neurological:     Mental Status: She is alert and oriented to person, place, and time.     Coordination: Coordination normal.     Vitals:   07/04/23 1445 07/04/23 1452  BP: (!) 143/68 (!) 143/68  Pulse: 99   Temp: 98.2 F (36.8 C)   TempSrc: Oral   SpO2: 100%   Weight: 153 lb (69.4 kg)   Height: 4\' 11"  (1.499 m)     Assessment & Plan:

## 2023-07-07 ENCOUNTER — Telehealth: Payer: Medicare Other | Admitting: Physician Assistant

## 2023-07-07 DIAGNOSIS — F0390 Unspecified dementia without behavioral disturbance: Secondary | ICD-10-CM | POA: Diagnosis not present

## 2023-07-07 DIAGNOSIS — F03918 Unspecified dementia, unspecified severity, with other behavioral disturbance: Secondary | ICD-10-CM

## 2023-07-07 NOTE — Assessment & Plan Note (Signed)
Flu shot complete for season. Pneumonia complete. Shingrix due at pharmacy. Tetanus due at pharmacy. Colonoscopy aged out. Mammogram aged out, pap smear aged out and dexa complete. Counseled about sun safety and mole surveillance. Counseled about the dangers of distracted driving. Given 10 year screening recommendations.
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
## 2023-07-07 NOTE — Assessment & Plan Note (Signed)
No flare today and is using flonase and xyzal and albuterol prn.

## 2023-07-07 NOTE — Assessment & Plan Note (Signed)
Checking lipid panel and adjust atorvastatin as needed.  

## 2023-07-07 NOTE — Assessment & Plan Note (Signed)
BP at goal of <150/90 given her other conditions. She is taking hydralazine 100 mg TID and losartan 100 mg daily. Checking CMP and adjust as needed.

## 2023-07-07 NOTE — Assessment & Plan Note (Addendum)
Overall memory is slight decline from last year but mostly stable. No new changes. Is taking aricept 10 mg daily and namenda 5 mg BID. Continue. Taking remeron for sleep disturbance and this is helping to stabilize mood.

## 2023-08-11 ENCOUNTER — Ambulatory Visit: Payer: Medicare Other | Admitting: Emergency Medicine

## 2023-08-11 ENCOUNTER — Encounter: Payer: Self-pay | Admitting: Emergency Medicine

## 2023-08-11 VITALS — BP 140/70 | HR 83 | Temp 98.7°F | Ht 59.0 in | Wt 151.2 lb

## 2023-08-11 DIAGNOSIS — L97523 Non-pressure chronic ulcer of other part of left foot with necrosis of muscle: Secondary | ICD-10-CM | POA: Diagnosis not present

## 2023-08-11 NOTE — Patient Instructions (Signed)

## 2023-08-11 NOTE — Assessment & Plan Note (Signed)
Not healing properly despite local wound care Recommend evaluation by podiatrist and wound care center Referrals placed today Wound care instructions given.

## 2023-08-11 NOTE — Progress Notes (Signed)
Stacy Page 89 y.o.   Chief Complaint  Patient presents with   Wound Check    Left foot stated off as a blister. Patient states seeing Dr. Okey Dupre in Nov and was given a clear abx cream. Patient states is getting worst     HISTORY OF PRESENT ILLNESS: Acute problem visit today This is a 89 y.o. female complaining of left foot wound not healing properly despite local treatment.  HPI   Prior to Admission medications   Medication Sig Start Date End Date Taking? Authorizing Provider  acetaminophen (TYLENOL) 500 MG tablet Take 500 mg by mouth every 6 (six) hours as needed.    Yes [provider]  atorvastatin (LIPITOR) 40 MG tablet Take 1 tablet (40 mg total) by mouth daily. 07/04/23  Yes Myrlene Broker, MD  azelastine (ASTELIN) 0.1 % nasal spray Place 2 sprays into both nostrils at bedtime as needed. (allergies) 07/22/15  Yes [provider]  clopidogrel (PLAVIX) 75 MG tablet Take 1 tablet (75 mg total) by mouth daily. 07/04/23  Yes Myrlene Broker, MD  cycloSPORINE (RESTASIS) 0.05 % ophthalmic emulsion Place 1 drop into both eyes 2 (two) times daily.   Yes [provider]  donepezil (ARICEPT) 10 MG tablet TAKE 1 TABLET BY MOUTH DAILY 12/27/22  Yes Marcos Eke, PA-C  feeding supplement, ENSURE ENLIVE, (ENSURE ENLIVE) LIQD Take 237 mLs by mouth 2 (two) times daily between meals. 02/01/16  Yes Regalado, Belkys A, MD  fluticasone (FLONASE) 50 MCG/ACT nasal spray Place 2 sprays into both nostrils daily as needed for allergies or rhinitis.   Yes [provider]  gabapentin (NEURONTIN) 100 MG capsule Take 1 capsule (100 mg total) by mouth 3 (three) times daily as needed (leg and lower back pain). 07/04/23  Yes Myrlene Broker, MD  hydrALAZINE (APRESOLINE) 100 MG tablet Take 1 tablet (100 mg total) by mouth 3 (three) times daily. 07/04/23  Yes Myrlene Broker, MD  hydrochlorothiazide (HYDRODIURIL) 12.5 MG tablet Take 1 tablet (12.5 mg  total) by mouth daily. 07/04/23  Yes Myrlene Broker, MD  hydrocortisone 2.5 % cream Apply topically 2 (two) times daily. 07/04/23  Yes Myrlene Broker, MD  levocetirizine (XYZAL) 5 MG tablet TAKE 1 TABLET BY MOUTH EVERY EVENING. 07/04/23  Yes Myrlene Broker, MD  losartan (COZAAR) 100 MG tablet Take 1 tablet (100 mg total) by mouth daily. 07/04/23  Yes Myrlene Broker, MD  meclizine (ANTIVERT) 25 MG tablet TAKE 1/2 TO 1 TABLET BY  MOUTH 3 TIMES DAILY AS  NEEDED FOR DIZZINESS OR  NAUSEA 07/04/23  Yes Myrlene Broker, MD  memantine [facility]) 5 MG tablet take 1 tablet (5 mg) twice a day 12/27/22  Yes Wertman, Sung Amabile, PA-C  mirtazapine (REMERON) 7.5 MG tablet Take 1 tablet (7.5 mg total) by mouth at bedtime. 07/04/23  Yes Myrlene Broker, MD  montelukast (SINGULAIR) 10 MG tablet Take 1 tablet (10 mg total) by mouth at bedtime. 07/04/23  Yes Myrlene Broker, MD  pantoprazole (PROTONIX) 20 MG tablet Take 1 tablet (20 mg total) by mouth daily. 07/04/23  Yes Myrlene Broker, MD  potassium chloride SA (KLOR-CON M) 20 MEQ tablet Take 1 tablet (20 mEq total) by mouth daily. 07/04/23  Yes Myrlene Broker, MD  triamcinolone cream (KENALOG) 0.1 % Apply topically 2 (two) times daily. 07/04/23  Yes Myrlene Broker, MD  albuterol (PROVENTIL HFA;VENTOLIN HFA) 108 (90 Base) MCG/ACT inhaler Inhale 1-2 puffs into the lungs  every 4 (four) hours as needed for wheezing or shortness of breath. 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) 08/11/2023   Dementia with behavioral disturbance (HCC) 07/08/2022   Hemorrhoid 05/29/2021   Rash 09/18/2020   Loss of weight 02/04/2016   Normocytic anemia 01/30/2016   Routine general medical examination at a health care facility 12/18/2014   Accelerated hypertension 03/12/2014   GERD 08/29/2008   HYPERLIPIDEMIA 06/14/2007   Asthma with allergic rhinitis without complication 06/14/2007   Allergic rhinitis 11/07/2006   SPINAL STENOSIS 11/07/2006    Past Medical History:  Diagnosis Date   Allergy    seasonal   Asthma    GERD (gastroesophageal reflux disease)    Hyperlipidemia    Hypertension    Transient cerebral ischemia     Past Surgical History:  Procedure Laterality Date   ABDOMINAL HYSTERECTOMY     for fibroids (no BSO)   BUNIONECTOMY Right    CARPAL TUNNEL RELEASE     Bilaterally   COLONOSCOPY  2011   tiny polyp ; [facility]   Macular Degeneration     Dr Luciana Axe  ; injections   UMBILICAL HERNIA REPAIR      Social History   Socioeconomic History   Marital status: Divorced    Spouse name: Not on file   Number of children: 2   Years of education: Not on file   Highest education level: Not on file  Occupational History   Not on file  Tobacco Use   Smoking status: Former    Current packs/day: 0.00    Types: Cigarettes    Quit date: 08/24/1979    Years since quitting: 43.9   Smokeless tobacco: Never   Tobacco comments:    smoked  1966-1981, up to 1 ppd  Vaping Use   Vaping status: Never Used  Substance and Sexual Activity   Alcohol use: No    Alcohol/week: 0.0 standard drinks of alcohol   Drug use: No   Sexual activity: Never  Other Topics Concern   Not on file  Social History Narrative   Right handed    One level apartment    No Caffeine   Social Drivers of Health   Financial Resource Strain: Low Risk  (05/31/2023)   Overall Financial Resource Strain  (CARDIA)    Difficulty of Paying Living Expenses: Not hard at all  Food Insecurity: No Food Insecurity (05/31/2023)   Hunger Vital Sign    Worried About Radiation protection practitioner of Food in the Last Year: Never true    Ran Out of Food in the Last Year: Never true  Transportation Needs: No Transportation Needs (05/31/2023)   PRAPARE - Administrator, Civil Service (Medical): No    Lack of Transportation (Non-Medical): No  Physical Activity: Inactive (05/31/2023)   Exercise Vital Sign    Days of Exercise per Week: 0  days    Minutes of Exercise per Session: 0 min  Stress: No Stress Concern Present (05/31/2023)   [employer] of Occupational Health - Occupational Stress Questionnaire    Feeling of Stress : Not at all  Social Connections: Moderately Integrated (05/31/2023)   Social Connection and Isolation Panel [NHANES]    Frequency of Communication with Friends and Family: More than three times a week    Frequency of Social Gatherings with Friends and Family: More than three times a week    Attends Religious Services: More than 4 times per year    Active Member of [employer] or Organizations: No    Attends Engineer, structural: More than 4 times per year    Marital Status: Widowed  Intimate Partner Violence: Not At Risk (05/31/2023)   Humiliation, Afraid, Rape, and Kick questionnaire    Fear of Current or Ex-Partner: No    Emotionally Abused: No    Physically Abused: No    Sexually Abused: No    Family History  Problem Relation Age of Onset   Alzheimer's disease Mother    Hypertension Mother    Stroke Mother 50   Hypertension Father    Heart attack Father        in 84's   Hypertension Maternal Grandmother    Heart attack Maternal Grandmother 62   Diabetes Other        1/2 sister   Colon cancer Neg Hx    Esophageal cancer Neg Hx    Pancreatic cancer Neg Hx    Kidney disease Neg Hx    Liver disease Neg Hx      Review of Systems  Constitutional: Negative.  Negative for  chills and fever.  HENT: Negative.  Negative for congestion and sore throat.   Respiratory: Negative.  Negative for cough and shortness of breath.   Cardiovascular: Negative.  Negative for chest pain and palpitations.  Gastrointestinal:  Negative for abdominal pain, diarrhea, nausea and vomiting.  Genitourinary: Negative.  Negative for dysuria and hematuria.  Skin: Negative.  Negative for rash.  Neurological: Negative.  Negative for dizziness and headaches.  All other systems reviewed and are negative.   Vitals:   08/11/23 1430  BP: (!) 140/70  Pulse: 83  Temp: 98.7 F (37.1 C)  SpO2: 95%    Physical Exam Vitals reviewed.  Constitutional:      Appearance: Normal appearance.  Eyes:     Extraocular Movements: Extraocular movements intact.  Cardiovascular:     Rate and Rhythm: Normal rate.  Pulmonary:     Effort: Pulmonary effort is normal.  Skin:    General: Skin is warm and dry.  Neurological:     Mental Status: She is alert and oriented to person, place, and time.  Psychiatric:        Mood and Affect: Mood normal.        Behavior: Behavior normal.      ASSESSMENT & PLAN: A total of 34 minutes was spent with the patient and counseling/coordination of care regarding preparing for this visit, review of most recent office visit notes, review of chronic medical conditions under management, review of all medications, diagnosis of foot ulcer and need for follow-up with wound care center and podiatrist, wound care instructions, prognosis, documentation and need for follow-up.   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      Relevant Orders   Ambulatory referral to Podiatry   AMB referral to wound care center    Patient Instructions  Wound Care, Adult Taking care of your wound properly can help to prevent pain, infection, and scarring. It can also help your wound heal more quickly. Follow instructions from your health care provider about how to care for your wound. Supplies needed: Soap and water. Wound cleanser, saline, or germ-free (sterile) water. Gauze. If needed, a clean bandage (dressing) or other type of wound dressing material to cover or place in the wound. Follow your health care provider's instructions about what dressing supplies to use. Cream or topical ointment to apply to the wound, if told by your health care provider. How to care for your wound Cleaning the wound Ask your health care provider how to clean the wound. This may include: Using mild soap and water, a wound cleanser, saline, or sterile water. Using a clean gauze to pat the wound dry after cleaning it. Do not rub or scrub the wound. Dressing care Wash your hands with soap and water for at least 20 seconds before and after you change the dressing. If soap and water are not available, use hand sanitizer. Change your dressing as told by your health care provider. This may include: Cleaning or rinsing out (irrigating) the wound. Application of cream or topical ointment, if told by your health care provider. Placing a dressing over the wound or in the wound (packing). Covering the wound with an outer dressing. Leave stitches (sutures), staples, skin glue, or adhesive strips in place. These skin closures may need to stay in place for 2 weeks or longer. If adhesive strip edges start to loosen and curl up, you may trim the loose edges. Do not remove adhesive strips completely unless your health care provider tells you to do that. Ask your health care provider when you can leave the wound uncovered. Checking for infection Check your wound area every day for signs of infection. Check for: More redness, swelling, or pain. Fluid  or blood. Warmth. Pus or a bad smell.  Follow these instructions at home Medicines If you were prescribed an antibiotic medicine, cream, or ointment, take or apply it as told by your health care provider. Do not stop using the antibiotic even if your condition improves. If you were prescribed pain medicine, take it 30 minutes before you do any wound care or as told by your health care provider. Take over-the-counter and prescription medicines only as told by your health care provider. Eating and drinking Eat a diet that includes protein, vitamin A, vitamin C, and other nutrient-rich foods to help the wound heal. Foods rich in protein include meat, fish, eggs, dairy, beans, and nuts. Foods rich in vitamin A include carrots and dark green, leafy vegetables. Foods rich in vitamin C include citrus fruits, tomatoes, broccoli, and peppers. Drink enough fluid to keep your urine pale yellow. General instructions Do not take baths, swim, or use a hot tub until your health care provider approves. Ask your health care provider if you may take showers. You may only be allowed to take sponge baths. Do not scratch or pick at the wound. Keep it covered as told by your health care provider. Return to your normal activities as told by your health care provider. Ask your health care provider what activities are  safe for you. Protect your wound from the sun when you are outside for the first 6 months, or for as long as told by your health care provider. Cover up the scar area or apply sunscreen that has an SPF of at least 30. Do not use any products that contain nicotine or tobacco. These products include cigarettes, chewing tobacco, and vaping devices, such as e-cigarettes. If you need help quitting, ask your health care provider. Keep all follow-up visits. This is important. Contact a health care provider if: You received a tetanus shot and you have swelling, severe pain, redness, or bleeding at the injection  site. Your pain is not controlled with medicine. You have any of these signs of infection: More redness, swelling, or pain around the wound. Fluid or blood coming from the wound. Warmth coming from the wound. A fever or chills. You are nauseous or you vomit. You are dizzy. You have a new rash or hardness around the wound. Get help right away if: You have a red streak of skin near the area around your wound. Pus or a bad smell coming from the wound. Your wound has been closed with staples, sutures, skin glue, or adhesive strips and it begins to open up and separate. Your wound is bleeding, and the bleeding does not stop with gentle pressure. These symptoms may represent a serious problem that is an emergency. Do not wait to see if the symptoms will go away. Get medical help right away. Call your local emergency services (911 in the [place] Do not drive yourself to the hospital. Summary Always wash your hands with soap and water for at least 20 seconds before and after changing your dressing. Change your dressing as told by your health care provider. To help with healing, eat foods that are rich in protein, vitamin A, vitamin C, and other nutrients. Check your wound every day for signs of infection. Contact your health care provider if you think that your wound is infected. This information is not intended to replace advice given to you by your health care provider. Make sure you discuss any questions you have with your health care provider. Document Revised: 12/16/2020 Document Reviewed: 12/16/2020 Elsevier Patient Education  2024 Elsevier Inc.    Edwina Barth, MD [facility]

## 2023-08-12 ENCOUNTER — Ambulatory Visit: Payer: Medicare Other | Admitting: Podiatry

## 2023-08-12 ENCOUNTER — Encounter: Payer: Self-pay | Admitting: Podiatry

## 2023-08-12 DIAGNOSIS — L97523 Non-pressure chronic ulcer of other part of left foot with necrosis of muscle: Secondary | ICD-10-CM | POA: Diagnosis not present

## 2023-08-12 NOTE — Progress Notes (Signed)
Subjective:   Patient ID: Stacy Page, female   DOB: 89 y.o.   MRN: [id number]   HPI Presents with caregiver with a open area on the side of the left foot that they are worried about and that it could be an ulcer even though it is feeling better.  Patient does not smoke is not active   Review of Systems  All other systems reviewed and are negative.       Objective:  Physical Exam Vitals and nursing note reviewed.  Constitutional:      Appearance: She is well-developed.  Pulmonary:     Effort: Pulmonary effort is normal.  Musculoskeletal:        General: Normal range of motion.  Skin:    General: Skin is warm.  Neurological:     Mental Status: She is alert.     Neurovascular status moderately reduced commensurate with age with moderate edema in the ankles bilateral with the left medial ankle showing a approximate 2 cm x 2 cm previous blister which is formed a surface on top no drainage no erythema no other pathology     Assessment:  Probability for trauma to the ankle which created a blister with stress on the skin tissue     Plan:  H&P reviewed I recommended continuing putting medication on it but letting it air dry and not wearing any shoes pressing against the area.  Should heal uneventfully if any changes were to occur let us know immediately     Patient

## 2023-09-07 ENCOUNTER — Telehealth: Payer: Medicare Other | Admitting: Physician Assistant

## 2023-09-07 ENCOUNTER — Encounter: Payer: Self-pay | Admitting: Emergency Medicine

## 2023-09-07 DIAGNOSIS — S81809D Unspecified open wound, unspecified lower leg, subsequent encounter: Secondary | ICD-10-CM

## 2023-09-07 NOTE — Telephone Encounter (Signed)
**Note De-identified  [place] Obfuscation** Please advise 

## 2023-09-07 NOTE — Progress Notes (Signed)
  Because of ongoing and worsening symptoms despite evaluation and management from specialist, I feel your condition warrants further evaluation and I recommend that you be seen in a face-to-face visit. I recommend contacting Dr. Merle Starcher office directly so they can get treatment started and monitor closely until your follow-up with the wound care center.    NOTE: There will be NO CHARGE for this E-Visit   If you are having a true medical emergency, please call 911.     For an urgent face to face visit, [facility] has multiple urgent care centers for your convenience.  Click the link below for the full list of locations and hours, walk-in wait times, appointment scheduling options and driving directions:  Urgent Care - [place], [place], [place], [place], [place], [place]  [place]     Your MyChart E-visit questionnaire answers were reviewed by a board certified advanced clinical practitioner to complete your personal care plan based on your specific symptoms.    Thank you for using e-Visits.

## 2023-09-08 ENCOUNTER — Encounter: Payer: Self-pay | Admitting: Podiatry

## 2023-09-08 ENCOUNTER — Ambulatory Visit: Payer: Medicare Other | Admitting: Podiatry

## 2023-09-08 DIAGNOSIS — L02619 Cutaneous abscess of unspecified foot: Secondary | ICD-10-CM | POA: Diagnosis not present

## 2023-09-08 DIAGNOSIS — L03119 Cellulitis of unspecified part of limb: Secondary | ICD-10-CM

## 2023-09-08 MED ORDER — SILVER SULFADIAZINE 1 % EX CREA: 1.0000 | TOPICAL_CREAM | Freq: Every day | CUTANEOUS | 1 refills | Status: AC

## 2023-09-11 NOTE — Progress Notes (Signed)
Subjective:   Patient ID: Stacy Page, female   DOB: 89 y.o.   MRN: [id number]   HPI Patient presents with family stating that in the last week she had a blister that they wanted to get checked on her left ankle.  States that she has not had any other issues and this just occurred recently   ROS      Objective:  Physical Exam  Neurovascular status unchanged from previous visit with moderate edema in the left ankle that appears to be more venous with a abscess on the medial side of the left ankle measuring about 1.5 x 1.5 cm no proximal edema erythema drainage noted no indications of systemic infection     Assessment:  Localized abscess in this area with blister formation with probability that this is a venous condition     Plan:  H&P sterile prep and anesthesia administered and using sterile blade I open the area of the fluid was found to be sterile no indications of purulent material I applied Silvadene to the area and I applied compression with sterile dressing and I like them to try to use an Ace wrap and elevation and see if this will heal with strict instructions if there should be any increase in swelling any in crease in anything or any systemic signs of infection to go straight to the emergency room

## 2023-09-21 ENCOUNTER — Ambulatory Visit
Admission: RE | Admit: 2023-09-21 | Discharge: 2023-09-21 | Disposition: A | Discharge status: Home / Self Care | Payer: Medicare Other | Source: Ambulatory Visit | Attending: Family Medicine | Admitting: Family Medicine

## 2023-09-21 ENCOUNTER — Other Ambulatory Visit: Payer: Self-pay

## 2023-09-21 ENCOUNTER — Ambulatory Visit (INDEPENDENT_AMBULATORY_CARE_PROVIDER_SITE_OTHER): Payer: Medicare Other

## 2023-09-21 VITALS — BP 135/79 | HR 95 | Temp 99.1°F

## 2023-09-21 DIAGNOSIS — Z20828 Contact with and (suspected) exposure to other viral communicable diseases: Secondary | ICD-10-CM | POA: Diagnosis not present

## 2023-09-21 DIAGNOSIS — R6889 Other general symptoms and signs: Secondary | ICD-10-CM | POA: Diagnosis not present

## 2023-09-21 DIAGNOSIS — R051 Acute cough: Secondary | ICD-10-CM

## 2023-09-21 DIAGNOSIS — R059 Cough, unspecified: Secondary | ICD-10-CM | POA: Diagnosis not present

## 2023-09-21 LAB — POC COVID19/FLU A&B COMBO
Covid Antigen, POC: NEGATIVE
Influenza A Antigen, POC: NEGATIVE
Influenza B Antigen, POC: NEGATIVE

## 2023-09-21 MED ORDER — OSELTAMIVIR PHOSPHATE 30 MG PO CAPS: 30.0000 mg | ORAL_CAPSULE | Freq: Two times a day (BID) | ORAL | 0 refills | Status: DC

## 2023-09-21 MED ORDER — BENZONATATE 100 MG PO CAPS: 100.0000 mg | ORAL_CAPSULE | Freq: Two times a day (BID) | ORAL | 0 refills | Status: DC | PRN

## 2023-09-21 MED ORDER — ONDANSETRON 4 MG PO TBDP
4.0000 mg | ORAL_TABLET | Freq: Once | ORAL | Status: AC
  Administered 2023-09-21: 4 mg via ORAL

## 2023-09-21 NOTE — Discharge Instructions (Addendum)
Start Tamiflu twice daily for 5 days.  You may take Tessalon twice daily as needed for your cough.  Lots of rest and fluids.  Please follow-up with your PCP in 1 to 2 days for recheck.  Please go to the ER ASAP if you develop any worsening symptoms.  I hope you feel better soon!

## 2023-09-21 NOTE — ED Triage Notes (Signed)
Patient C/O flu like symptoms x 3 days. C/O fever and chills, body aches, non productive cough. Taking otc cough medication.

## 2023-09-21 NOTE — ED Provider Notes (Addendum)
[facility]    CSN: [id number] Arrival date & time: 09/21/23  1436      History   Chief Complaint Chief Complaint  Patient presents with   Cough    HPI Stacy Page is a 89 y.o. female  presents for evaluation of URI symptoms for 2 days. Patient reports associated symptoms of cough, congestion, body aches, fevers, sore throat, nausea/vomiting. Denies diarrhea, ear pain, shortness of breath. Patient does have a hx of asthma.  Has an albuterol inhaler but has not used since symptom onset.  Patient is not an active smoker.   Reports to flu via family member.  Pt has taken cold medicine OTC for symptoms. Pt has no other concerns at this time.    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) 08/11/2023   Dementia with behavioral disturbance (HCC) 07/08/2022   Hemorrhoid 05/29/2021   Rash 09/18/2020   Loss of weight 02/04/2016   Normocytic anemia 01/30/2016   Routine general medical examination at a health care facility 12/18/2014   Accelerated hypertension 03/12/2014   GERD 08/29/2008   HYPERLIPIDEMIA 06/14/2007   Asthma with allergic rhinitis without complication 06/14/2007   Allergic rhinitis 11/07/2006   SPINAL STENOSIS 11/07/2006    Past Surgical History:  Procedure Laterality Date   ABDOMINAL HYSTERECTOMY     for fibroids (no BSO)   BUNIONECTOMY Right    CARPAL TUNNEL RELEASE     Bilaterally   COLONOSCOPY  2011   tiny polyp ; [facility]   Macular Degeneration     Dr Luciana Axe  ; injections   UMBILICAL HERNIA REPAIR      OB History   No obstetric history on file.      Home Medications    Prior to Admission medications   Medication Sig Start Date End  Date Taking? Authorizing Provider  benzonatate (TESSALON) 100 MG capsule Take 1 capsule (100 mg total) by mouth 2 (two) times daily as needed for cough. 09/21/23  Yes Radford Pax, NP  oseltamivir (TAMIFLU) 30 MG capsule Take 1 capsule (30 mg total) by mouth 2 (two) times daily for 5 days. 09/21/23 09/26/23 Yes Radford Pax, NP  acetaminophen (TYLENOL) 500 MG tablet Take 500 mg by mouth every 6 (six) hours as needed.     [provider]  albuterol (PROVENTIL HFA;VENTOLIN HFA) 108 (90 Base) MCG/ACT inhaler Inhale 1-2 puffs into the lungs every 4 (four) hours as needed for wheezing or shortness of breath.    [provider]  atorvastatin (LIPITOR) 40 MG tablet Take 1 tablet (40 mg total) by mouth daily. 07/04/23   Myrlene Broker, MD  azelastine (ASTELIN) 0.1 % nasal spray Place 2 sprays into both nostrils at bedtime as needed. (allergies) 07/22/15   [provider]  clopidogrel (PLAVIX) 75 MG tablet Take 1 tablet (75 mg total) by mouth daily. 07/04/23   Myrlene Broker, MD  cycloSPORINE (RESTASIS) 0.05 % ophthalmic emulsion Place 1 drop into both eyes 2 (two) times daily.    [provider]  donepezil (ARICEPT) 10 MG tablet TAKE 1 TABLET BY MOUTH DAILY 12/27/22  Marcos Eke, PA-C  feeding supplement, ENSURE ENLIVE, (ENSURE ENLIVE) LIQD Take 237 mLs by mouth 2 (two) times daily between meals. 02/01/16   Regalado, Belkys A, MD  fluticasone (FLONASE) 50 MCG/ACT nasal spray Place 2 sprays into both nostrils daily as needed for allergies or rhinitis.    [provider]  gabapentin (NEURONTIN) 100 MG capsule Take 1 capsule (100 mg total) by mouth 3 (three) times daily as needed (leg and lower back pain). 07/04/23   Myrlene Broker, MD  hydrALAZINE (APRESOLINE) 100 MG tablet Take 1 tablet (100 mg total) by mouth 3 (three) times daily. 07/04/23   Myrlene Broker, MD  hydrochlorothiazide (HYDRODIURIL) 12.5 MG tablet Take 1 tablet (12.5 mg  total) by mouth daily. 07/04/23   Myrlene Broker, MD  hydrocortisone 2.5 % cream Apply topically 2 (two) times daily. 07/04/23   Myrlene Broker, MD  levocetirizine (XYZAL) 5 MG tablet TAKE 1 TABLET BY MOUTH EVERY EVENING. 07/04/23   Myrlene Broker, MD  losartan (COZAAR) 100 MG tablet Take 1 tablet (100 mg total) by mouth daily. 07/04/23   Myrlene Broker, MD  meclizine (ANTIVERT) 25 MG tablet TAKE 1/2 TO 1 TABLET BY  MOUTH 3 TIMES DAILY AS  NEEDED FOR DIZZINESS OR  NAUSEA 07/04/23   Myrlene Broker, MD  memantine [facility]) 5 MG tablet take 1 tablet (5 mg) twice a day 12/27/22   Marcos Eke, PA-C  mirtazapine (REMERON) 7.5 MG tablet Take 1 tablet (7.5 mg total) by mouth at bedtime. 07/04/23   Myrlene Broker, MD  montelukast (SINGULAIR) 10 MG tablet Take 1 tablet (10 mg total) by mouth at bedtime. 07/04/23   Myrlene Broker, MD  pantoprazole (PROTONIX) 20 MG tablet Take 1 tablet (20 mg total) by mouth daily. 07/04/23   Myrlene Broker, MD  potassium chloride SA (KLOR-CON M) 20 MEQ tablet Take 1 tablet (20 mEq total) by mouth daily. 07/04/23   Myrlene Broker, MD  silver sulfADIAZINE (SILVADENE) 1 % cream Apply 1 Application topically daily. 09/08/23   Lenn Sink, DPM  triamcinolone cream (KENALOG) 0.1 % Apply topically 2 (two) times daily. 07/04/23   Myrlene Broker, MD    Family History Family History  Problem Relation Age of Onset   Alzheimer's disease Mother    Hypertension Mother    Stroke Mother 46   Hypertension Father    Heart attack Father        in 22's   Hypertension Maternal Grandmother    Heart attack Maternal Grandmother 52   Diabetes Other        1/2 sister   Colon cancer Neg Hx    Esophageal cancer Neg Hx    Pancreatic cancer Neg Hx    Kidney disease Neg Hx    Liver disease Neg Hx     Social History Social History   Tobacco Use   Smoking status: Former    Current packs/day: 0.00    Types:  Cigarettes    Quit date: 08/24/1979    Years since quitting: 44.1   Smokeless tobacco: Never   Tobacco comments:    smoked  1966-1981, up to 1 ppd  Vaping Use   Vaping status: Never Used  Substance Use Topics   Alcohol use: No    Alcohol/week: 0.0 standard drinks of alcohol   Drug use: No     Allergies   Bystolic [nebivolol hcl], Aspirin, Cardizem [diltiazem hcl], Amlodipine, Metoclopramide hcl, and Spironolactone  Review of Systems Review of Systems  Constitutional:  Positive for fever.  HENT:  Positive for congestion and sore throat.   Respiratory:  Positive for cough.      Physical Exam Triage Vital Signs ED Triage Vitals  Encounter Vitals Group     BP 09/21/23 1508 (!) 155/78     Systolic BP Percentile --      Diastolic BP Percentile --      Pulse Rate 09/21/23 1508 93     Resp --      Temp 09/21/23 1507 99.1 F (37.3 C)     Temp Source 09/21/23 1507 Oral     SpO2 09/21/23 1508 94 %     Weight --      Height --      Head Circumference --      Peak Flow --      Pain Score 09/21/23 1509 0     Pain Loc --      Pain Education --      Exclude from Growth Chart --    No data found.  Updated Vital Signs BP 135/79 (BP Location: Left Arm)   Pulse 95   Temp 99.1 F (37.3 C) (Oral)   SpO2 93%   Visual Acuity Right Eye Distance:   Left Eye Distance:   Bilateral Distance:    Right Eye Near:   Left Eye Near:    Bilateral Near:     Physical Exam Vitals and nursing note reviewed.  Constitutional:      General: She is not in acute distress.    Appearance: She is well-developed. She is not ill-appearing.  HENT:     Head: Normocephalic and atraumatic.     Right Ear: Tympanic membrane and ear canal normal.     Left Ear: Tympanic membrane and ear canal normal.     Nose: Congestion present.     Mouth/Throat:     Mouth: Mucous membranes are moist.     Pharynx: Oropharynx is clear. Uvula midline. Posterior oropharyngeal erythema present.     Tonsils: No  tonsillar exudate or tonsillar abscesses.  Eyes:     Conjunctiva/sclera: Conjunctivae normal.     Pupils: Pupils are equal, round, and reactive to light.  Cardiovascular:     Rate and Rhythm: Normal rate and regular rhythm.     Heart sounds: Normal heart sounds.  Pulmonary:     Effort: Pulmonary effort is normal. No respiratory distress.     Breath sounds: Normal breath sounds. No wheezing, rhonchi or rales.  Musculoskeletal:     Cervical back: Normal range of motion and neck supple.  Lymphadenopathy:     Cervical: No cervical adenopathy.  Skin:    General: Skin is warm and dry.  Neurological:     General: No focal deficit present.     Mental Status: She is alert and oriented to person, place, and time.  Psychiatric:        Mood and Affect: Mood normal.        Behavior: Behavior normal.      UC Treatments / Results  Labs (all labs ordered are listed, but only abnormal results are displayed) Labs Reviewed  POC COVID19/FLU A&B COMBO   Comprehensive metabolic panel Order: [id number]  Status: Final result     Next appt: 09/27/2023 at 12:30 PM in Wound Care (Aldean Baker, DO)     Dx: Mixed hyperlipidemia   Test Result Released: Yes (seen)     Messages: Seen  1 Result Note     1 Patient Communication          Component Ref Range & Units (hover) 2 mo ago (07/04/23) 1 yr ago (07/08/22) 2 yr ago (05/29/21) 3 yr ago (06/10/20) 3 yr ago (03/06/20) 3 yr ago (01/17/20) 4 yr ago (10/05/18)  Sodium 139 142 143 137 138 R 136 142  Potassium 3.8 3.4 Low  3.3 Low  3.5 3.9 R 3.2 Low  3.4 Low   Chloride 100 103 106 101 104 R 102 104  CO2 30 29 27 29 23  R 29 29  Glucose, Bld 97 87 88 75 81 R, CM 86 106 High   BUN 23 22 18 11 14  R 15 16  Creatinine, Ser 1.20 0.89 0.99 0.84 0.86 R, CM 0.94 0.92  Total Bilirubin 0.4 0.4 0.4 0.5 0.3 0.4 0.3  Alkaline Phosphatase 119 High  103 101 110  99 111  AST 25 25 19 22 22  R 18 17  ALT 18 24 12 22 25  R 13 13  Total Protein 7.8 7.3 7.5  7.1 6.6 R 7.2 7.3  Albumin 4.1 3.9 4.0 3.9  4.1 4.1  GFR 40.47 Low  58.33 Low  CM 51.73 Low  CM 63.26  68.50 70.44  Comment: Calculated using the CKD-EPI Creatinine Equation (2021)  Calcium 10.0 9.4 9.5 9.5 9.5 R 9.6 9.6  Resulting Agency [place] [facility] [place] [facility] [place] [facility] [place] [facility]        EKG   Radiology No results found.  Procedures Procedures (including critical care time)  Medications Ordered in UC Medications  ondansetron (ZOFRAN-ODT) disintegrating tablet 4 mg (4 mg Oral Given 09/21/23 1626)    Initial Impression / Assessment and Plan / UC Course  I have reviewed the triage vital signs and the nursing notes.  Pertinent labs & imaging results that were available during my care of the patient were reviewed by me and considered in my medical decision making (see chart for details).     Reviewed exam and symptoms with patient and family.  Patient given Zofran in clinic for nausea/vomiting.       Patient with known flu exposure with flulike symptoms x 2 days.  Wet read of x-ray without obvious consolidation, will contact for any positive results based on radiology overread.  Point-of-care flu/COVID was negative however, will start Tamiflu twice daily for 5 days and patient age and medical history and known exposure.  Dose adjusted based on her creatinine clearance of 35.  Will do Tessalon twice a day as needed for cough.  Discussed rest and fluids.  OTC analgesics as needed.  PCP follow-up 1 to 2 days for recheck.  Strict ER precautions reviewed and patient and family verbalized understanding. Final Clinical Impressions(s) / UC Diagnoses   Final diagnoses:  Acute cough  Exposure to influenza  Flu-like symptoms     Discharge Instructions      Start Tamiflu twice daily for 5 days.  You may take Tessalon twice daily as needed for your cough.  Lots of rest and fluids.  Please follow-up with your PCP in 1 to 2 days for recheck.  Please go to the ER ASAP  if you develop any worsening symptoms.  I hope you feel better soon!     ED Prescriptions     Medication Sig Dispense Auth. Provider   oseltamivir (TAMIFLU) 30 MG capsule Take 1 capsule (30 mg total) by mouth 2 (two) times daily for 5 days. 10 capsule  Radford Pax, NP   benzonatate (TESSALON) 100 MG capsule Take 1 capsule (100 mg total) by mouth 2 (two) times daily as needed for cough. 12 capsule Radford Pax, NP      PDMP not reviewed this encounter.   Radford Pax, NP 09/21/23 1653    Radford Pax, NP 09/21/23 1655

## 2023-09-22 ENCOUNTER — Telehealth: Payer: Self-pay

## 2023-09-22 ENCOUNTER — Telehealth: Payer: Self-pay | Admitting: Internal Medicine

## 2023-09-22 NOTE — Telephone Encounter (Signed)
Copied from CRM [id number]. Topic: Clinical - Medication Question >> Sep 22, 2023  2:19 PM Adele Barthel wrote: Reason for CRM: patient and daughter were seen at [facility] urgent care on 09/21/2023, due to exposure to flu. Tested negative and was prescribed oseltamivir (TAMIFLU) 30 MG capsule and benzonatate (TESSALON) 100 MG capsule. Patient's daughter reports no pharmacies have the Tamiflu 30 mg in stock. Daughter was prescribed Tamiflu 75 mg and gave her mother 1 tablet last night and 1 tablet today, seems to be doing better. Would like to see if Tamiflu 75 mg can be prescribed for her mother. Daughter also notes the prescribed Tessalon capsules are currently on hold at the pharmacy and she is not sure why. Is requesting if both the Tamiflu and Tessalon capsules prescriptions can be sent to [facility] on file. CB# [phone]

## 2023-09-22 NOTE — Telephone Encounter (Signed)
Patient called and stated she her pharmacy does not have tamiflu RX. I spoke with Lennox Laity NP and advised her. I called the patient and advised her to call around to other pharmacies to try and find one that has it. I advised her she needs to start it within 48 hours and 72 hours at the latest. I advised her to call if she has pharmacy that has it and we will send the RX over to them.

## 2023-09-23 ENCOUNTER — Encounter: Payer: Self-pay | Admitting: Internal Medicine

## 2023-09-23 ENCOUNTER — Telehealth (INDEPENDENT_AMBULATORY_CARE_PROVIDER_SITE_OTHER): Payer: Medicare Other | Admitting: Internal Medicine

## 2023-09-23 DIAGNOSIS — L97523 Non-pressure chronic ulcer of other part of left foot with necrosis of muscle: Secondary | ICD-10-CM | POA: Diagnosis not present

## 2023-09-23 DIAGNOSIS — J111 Influenza due to unidentified influenza virus with other respiratory manifestations: Secondary | ICD-10-CM | POA: Diagnosis not present

## 2023-09-23 MED ORDER — BENZONATATE 200 MG PO CAPS: 200.0000 mg | ORAL_CAPSULE | Freq: Three times a day (TID) | ORAL | 0 refills | Status: DC | PRN

## 2023-09-23 MED ORDER — OSELTAMIVIR PHOSPHATE 75 MG PO CAPS: 75.0000 mg | ORAL_CAPSULE | Freq: Two times a day (BID) | ORAL | 0 refills | Status: DC

## 2023-09-23 NOTE — Telephone Encounter (Signed)
Her initiation of tamiflu was not within 48 hours of symptoms starting so I would not recommend to take. Evidence is very limited that tamiflu does anything and this is only within 48 hours of symptom onset. I cannot help with tessalon perles as I do not know what the issue is patient would need to ask pharmacy

## 2023-09-23 NOTE — Assessment & Plan Note (Signed)
Rx tamiflu 75 mg since 30 mg not in stock locally. Rx tessalon perles 200 mg TID prn for cough.

## 2023-09-23 NOTE — Progress Notes (Signed)
Virtual Visit via Video Note  I connected with Stacy Page on 09/23/23 at 12:20 PM EST by a video enabled telemedicine application and verified that I am speaking with the correct person using two identifiers.  The patient and the provider were at separate locations throughout the entire encounter. Patient location: home, Provider location: work   I discussed the limitations of evaluation and management by telemedicine and the availability of in person appointments. The patient expressed understanding and agreed to proceed. The patient and the provider were the only parties present for the visit unless noted in HPI below.  History of Present Illness: The patient is a 89 y.o. female with visit for flu like symptoms. Started taking tamiflu from family member due to pharmacies locally not having 30 mg. Has improved significantly still some cough  Observations/Objective: Appearance: normal, breathing appears normal some coughing, casual grooming  Assessment and Plan: See problem oriented charting  Follow Up Instructions: rx tamiflu and tessalon perles  I discussed the assessment and treatment plan with the patient. The patient was provided an opportunity to ask questions and all were answered. The patient agreed with the plan and demonstrated an understanding of the instructions.   The patient was advised to call back or seek an in-person evaluation if the symptoms worsen or if the condition fails to improve as anticipated.  Myrlene Broker, MD

## 2023-09-27 ENCOUNTER — Encounter (HOSPITAL_BASED_OUTPATIENT_CLINIC_OR_DEPARTMENT_OTHER): Payer: Medicare Other | Attending: Internal Medicine | Admitting: Internal Medicine

## 2023-09-27 DIAGNOSIS — I87322 Chronic venous hypertension (idiopathic) with inflammation of left lower extremity: Secondary | ICD-10-CM | POA: Insufficient documentation

## 2023-10-10 ENCOUNTER — Encounter: Payer: Self-pay | Admitting: Internal Medicine

## 2023-11-06 ENCOUNTER — Encounter: Payer: Self-pay | Admitting: Physician Assistant

## 2023-11-07 ENCOUNTER — Other Ambulatory Visit: Payer: Self-pay

## 2023-11-07 MED ORDER — MEMANTINE HCL 5 MG PO TABS: ORAL_TABLET | ORAL | 0 refills | Status: DC

## 2023-11-07 MED ORDER — MIRTAZAPINE 7.5 MG PO TABS: 7.5000 mg | ORAL_TABLET | Freq: Every day | ORAL | 0 refills | Status: DC

## 2023-12-30 ENCOUNTER — Ambulatory Visit (INDEPENDENT_AMBULATORY_CARE_PROVIDER_SITE_OTHER): Admitting: Internal Medicine

## 2023-12-30 ENCOUNTER — Encounter: Payer: Self-pay | Admitting: Internal Medicine

## 2023-12-30 VITALS — BP 134/68 | HR 86 | Temp 98.1°F | Ht 59.0 in | Wt 152.0 lb

## 2023-12-30 DIAGNOSIS — F03918 Unspecified dementia, unspecified severity, with other behavioral disturbance: Secondary | ICD-10-CM

## 2023-12-30 DIAGNOSIS — L97523 Non-pressure chronic ulcer of other part of left foot with necrosis of muscle: Secondary | ICD-10-CM | POA: Diagnosis not present

## 2023-12-30 NOTE — Assessment & Plan Note (Signed)
 Overall stable but appetite and activity are declining. She likely needs assistance in the home they are encouraged to see if insurance will cover and let us  know this information so we can place referral for aide.

## 2023-12-30 NOTE — Assessment & Plan Note (Signed)
 Not currently open but prior referral done to dermatology. They are given number as derm has been reaching out. Suspect inactivity and poor appetite contribute to dependent edema and recurrent ulcer on the ankle. This is not directly on a pressure point.

## 2023-12-30 NOTE — Progress Notes (Signed)
   Subjective:   Patient ID: Stacy Page, female    DOB: [birth year], 89 y.o.   MRN: [id number]  Ankle Pain    The patient is an 89 YO female coming in for concerns about recurring ulcer on left ankle. She is not very mobile in the home and is in bed more often. Sleeping more during day. Appetite is not good but drinking ensure and weight is stable. No new concerns.   Review of Systems  Constitutional:  Positive for activity change and appetite change.  HENT: Negative.    Eyes: Negative.   Respiratory:  Negative for cough, chest tightness and shortness of breath.   Cardiovascular:  Negative for chest pain, palpitations and leg swelling.  Gastrointestinal:  Negative for abdominal distention, abdominal pain, constipation, diarrhea, nausea and vomiting.  Musculoskeletal: Negative.   Skin:  Positive for color change.  Neurological: Negative.   Psychiatric/Behavioral: Negative.      Objective:  Physical Exam Constitutional:      Appearance: She is well-developed.  HENT:     Head: Normocephalic and atraumatic.  Cardiovascular:     Rate and Rhythm: Normal rate and regular rhythm.  Pulmonary:     Effort: Pulmonary effort is normal. No respiratory distress.     Breath sounds: Normal breath sounds. No wheezing or rales.  Abdominal:     General: Bowel sounds are normal. There is no distension.     Palpations: Abdomen is soft.     Tenderness: There is no abdominal tenderness. There is no rebound.  Musculoskeletal:     Cervical back: Normal range of motion.  Skin:    General: Skin is warm and dry.     Comments: Purple circular area left ankle without open wound, some swelling bilateral ankles without blistering currently.   Neurological:     Mental Status: She is alert and oriented to person, place, and time.     Coordination: Coordination abnormal.     Comments: Use walker     Vitals:   12/30/23 0934  BP: 134/68  Pulse: 86  Temp: 98.1 F (36.7 C)  SpO2: 98%  Weight: 152  lb (68.9 kg)  Height: 4\' 11"  (1.499 m)    Assessment & Plan:

## 2023-12-30 NOTE — Patient Instructions (Addendum)
 Call the dermatologist to schedule [phone] and select [facility] to schedule an appointment.   Call the insurance company to see if they will cover home health aide for her.

## 2024-01-05 DIAGNOSIS — L810 Postinflammatory hyperpigmentation: Secondary | ICD-10-CM | POA: Diagnosis not present

## 2024-01-20 ENCOUNTER — Encounter: Payer: Self-pay | Admitting: Physician Assistant

## 2024-01-20 ENCOUNTER — Other Ambulatory Visit: Payer: Self-pay

## 2024-01-20 MED ORDER — DONEPEZIL HCL 10 MG PO TABS: ORAL_TABLET | ORAL | 3 refills | Status: DC

## 2024-01-20 MED ORDER — MEMANTINE HCL 5 MG PO TABS: ORAL_TABLET | ORAL | 0 refills | Status: DC

## 2024-02-02 DIAGNOSIS — L810 Postinflammatory hyperpigmentation: Secondary | ICD-10-CM | POA: Diagnosis not present

## 2024-02-29 ENCOUNTER — Ambulatory Visit: Admitting: Physician Assistant

## 2024-04-18 ENCOUNTER — Other Ambulatory Visit: Payer: Self-pay | Admitting: Physician Assistant

## 2024-04-21 ENCOUNTER — Encounter: Payer: Self-pay | Admitting: Physician Assistant

## 2024-04-24 ENCOUNTER — Encounter: Payer: Self-pay | Admitting: Internal Medicine

## 2024-05-02 ENCOUNTER — Encounter: Payer: Self-pay | Admitting: Internal Medicine

## 2024-05-02 ENCOUNTER — Ambulatory Visit: Admitting: Internal Medicine

## 2024-05-02 VITALS — BP 132/84 | HR 97 | Temp 98.6°F | Ht 59.0 in | Wt 149.0 lb

## 2024-05-02 DIAGNOSIS — Z23 Encounter for immunization: Secondary | ICD-10-CM | POA: Diagnosis not present

## 2024-05-02 DIAGNOSIS — F03918 Unspecified dementia, unspecified severity, with other behavioral disturbance: Secondary | ICD-10-CM | POA: Diagnosis not present

## 2024-05-02 DIAGNOSIS — R351 Nocturia: Secondary | ICD-10-CM | POA: Diagnosis not present

## 2024-05-02 MED ORDER — NITROFURANTOIN MONOHYD MACRO 100 MG PO CAPS: 100.0000 mg | ORAL_CAPSULE | Freq: Two times a day (BID) | ORAL | 0 refills | Status: AC

## 2024-05-02 NOTE — Progress Notes (Signed)
   Subjective:   Patient ID: Stacy Page, female    DOB: 1933-08-17, 89 y.o.   MRN: [id number]  Discussed the use of AI scribe software for clinical note transcription with the patient, who gave verbal consent to proceed.  History of Present Illness Stacy Page is an 89 year old female who presents with increased nighttime urination. Due to dementia 2 daughters present who help to provide history.   She experiences increased frequency of urination at night without any associated pain, burning, or discomfort. There is no confusion or changes in mental status. Her appetite remains good, although she occasionally desires a meal. She has experienced some itchiness after bathing, which may be due to dry skin or irritation from wiping.  She is currently taking hydrochlorothiazide  for blood pressure management. Previous attempts with other blood pressure medications, such as amlodipine , resulted in swelling and bradycardia, making them unsuitable. Her blood pressure has been high in the past, and efforts have been made to control it.  No new constipation, diarrhea, chest pain, or breathing difficulties. She is not very active and sleeps a lot, but this is not a concern for her.  Review of Systems  Unable to perform ROS: Dementia  Constitutional: Negative.   HENT: Negative.    Eyes: Negative.   Respiratory:  Negative for cough, chest tightness and shortness of breath.   Cardiovascular:  Negative for chest pain, palpitations and leg swelling.  Gastrointestinal:  Negative for abdominal distention, abdominal pain, constipation, diarrhea, nausea and vomiting.  Genitourinary:  Positive for enuresis.  Musculoskeletal: Negative.   Skin: Negative.   Neurological: Negative.   Psychiatric/Behavioral: Negative.      Objective:  Physical Exam Constitutional:      Appearance: She is well-developed.  HENT:     Head: Normocephalic and atraumatic.  Cardiovascular:     Rate and Rhythm: Normal rate  and regular rhythm.  Pulmonary:     Effort: Pulmonary effort is normal. No respiratory distress.     Breath sounds: Normal breath sounds. No wheezing or rales.  Abdominal:     General: Bowel sounds are normal. There is no distension.     Palpations: Abdomen is soft.     Tenderness: There is no abdominal tenderness.  Musculoskeletal:     Cervical back: Normal range of motion.  Skin:    General: Skin is warm and dry.  Neurological:     Mental Status: She is alert.     Coordination: Coordination normal.     Vitals:   05/02/24 1042  BP: 132/84  Pulse: 97  Temp: 98.6 F (37 C)  TempSrc: Oral  SpO2: 98%  Weight: 149 lb (67.6 kg)  Height: 4' 11 (1.499 m)   Flu shot given at visit   Assessment and Plan Assessment & Plan Suspected urinary tract infection with nocturia   Increased nocturia is present without dysuria or systemic symptoms, suggesting a possible UTI. Prescribe Macrobid  100 mg, one capsule twice daily for 5 days. Discuss Macrobid 's renal and bladder concentration to minimize systemic side effects. Advise reporting persistent or recurrent nocturia.  Hypertension   Hypertension is managed with hydrochlorothiazide  due to adverse effects from previous antihypertensives. Continue current regimen including hydrochlorothiazide  for blood pressure management.

## 2024-05-02 NOTE — Assessment & Plan Note (Signed)
 Overall stable and she is taking aricept  and namenda .

## 2024-05-02 NOTE — Assessment & Plan Note (Addendum)
 Increased nocturia is present without dysuria or systemic symptoms, suggesting a possible UTI. Prescribe Macrobid  100 mg, one capsule twice daily for 5 days. Discuss Macrobid 's renal and bladder concentration to minimize systemic side effects. Advise reporting persistent or recurrent nocturia. She is unable to provide sample today.

## 2024-05-02 NOTE — Patient Instructions (Signed)
We have sent in the macrobid to take 1 pill twice a day for 5 days.

## 2024-05-07 ENCOUNTER — Other Ambulatory Visit: Payer: Self-pay | Admitting: Internal Medicine

## 2024-05-23 ENCOUNTER — Encounter: Payer: Self-pay | Admitting: Physician Assistant

## 2024-05-23 ENCOUNTER — Ambulatory Visit: Admitting: Physician Assistant

## 2024-05-23 VITALS — BP 106/69 | HR 110 | Resp 20 | Wt 150.0 lb

## 2024-05-23 DIAGNOSIS — F03918 Unspecified dementia, unspecified severity, with other behavioral disturbance: Secondary | ICD-10-CM | POA: Diagnosis not present

## 2024-05-23 MED ORDER — DONEPEZIL HCL 10 MG PO TABS: ORAL_TABLET | ORAL | 3 refills | Status: AC

## 2024-05-23 MED ORDER — MEMANTINE HCL 5 MG PO TABS: ORAL_TABLET | ORAL | 0 refills | Status: AC

## 2024-05-23 MED ORDER — MIRTAZAPINE 7.5 MG PO TABS: 7.5000 mg | ORAL_TABLET | Freq: Every day | ORAL | 0 refills | Status: AC

## 2024-05-23 NOTE — Patient Instructions (Addendum)
 Good to see you!  Continue Donepezil  10mg  daily.  Continue memantine  10 mg daily   Continue mirtazapine , refill given     Follow-up in 6 months, call for any changes   FALL PRECAUTIONS: Be cautious when walking. Scan the area for obstacles that may increase the risk of trips and falls. When getting up in the mornings, sit up at the edge of the bed for a few minutes before getting out of bed. Consider elevating the bed at the head end to avoid drop of blood pressure when getting up. Walk always in a well-lit room (use night lights in the walls). Avoid area rugs or power cords from appliances in the middle of the walkways. Use a walker or a cane if necessary and consider physical therapy for balance exercise. Get your eyesight checked regularly.  HOME SAFETY: Consider the safety of the kitchen when operating appliances like stoves, microwave oven, and blender. Consider having supervision and share cooking responsibilities until no longer able to participate in those. Accidents with firearms and other hazards in the house should be identified and addressed as well.   ABILITY TO BE LEFT ALONE: If patient is unable to contact 911 operator, consider using LifeLine, or when the need is there, arrange for someone to stay with patients. Smoking is a fire hazard, consider supervision or cessation. Risk of wandering should be assessed by caregiver and if detected at any point, supervision and safe proof recommendations should be instituted.  RECOMMENDATIONS FOR ALL PATIENTS WITH MEMORY PROBLEMS: 1. Continue to exercise (Recommend 30 minutes of walking everyday, or 3 hours every week) 2. Increase social interactions - continue going to [place] and enjoy social gatherings with friends and family 3. Eat healthy, avoid fried foods and eat more fruits and vegetables 4. Maintain adequate blood pressure, blood sugar, and blood cholesterol level. Reducing the risk of stroke and cardiovascular disease also helps  promoting better memory. 5. Avoid stressful situations. Live a simple life and avoid aggravations. Organize your time and prepare for the next day in anticipation. 6. Sleep well, avoid any interruptions of sleep and avoid any distractions in the bedroom that may interfere with adequate sleep quality 7. Avoid sugar, avoid sweets as there is a strong link between excessive sugar intake, diabetes, and cognitive impairment The Mediterranean diet has been shown to help patients reduce the risk of progressive memory disorders and reduces cardiovascular risk. This includes eating fish, eat fruits and green leafy vegetables, nuts like almonds and hazelnuts, walnuts, and also use olive oil. Avoid fast foods and fried foods as much as possible. Avoid sweets and sugar as sugar use has been linked to worsening of memory function.  There is always a concern of gradual progression of memory problems. If this is the case, then we may need to adjust level of care according to patient needs. Support, both to the patient and caregiver, should then be put into place.
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
## 2024-05-31 ENCOUNTER — Ambulatory Visit: Payer: Medicare Other

## 2024-06-08 ENCOUNTER — Ambulatory Visit

## 2024-06-11 ENCOUNTER — Encounter: Payer: Self-pay | Admitting: Internal Medicine

## 2024-06-11 NOTE — Telephone Encounter (Signed)
**Note De-identified  [place] Obfuscation** Please advise 

## 2024-07-13 ENCOUNTER — Other Ambulatory Visit: Payer: Self-pay | Admitting: Internal Medicine

## 2024-08-03 ENCOUNTER — Ambulatory Visit

## 2024-08-03 VITALS — Ht 59.0 in | Wt 150.0 lb

## 2024-08-03 DIAGNOSIS — Z0000 Encounter for general adult medical examination without abnormal findings: Secondary | ICD-10-CM | POA: Diagnosis not present

## 2024-08-03 NOTE — Progress Notes (Signed)
 Chief Complaint  Patient presents with   Medicare Wellness     Subjective:  Please attest and cosign this visit due to patients primary care provider not being in the office at the time the visit was completed.  (Pt of Dr Almarie Cleveland)   Stacy Page is a 89 y.o. female who presents for a Medicare Annual Wellness Visit.  Visit info / Clinical Intake: Medicare Wellness Visit Type:: Subsequent Annual Wellness Visit Persons participating in visit and providing information:: caregiver (with patient present) (with Emri Sample - Dtr) Medicare Wellness Visit Mode:: Telephone If telephone:: video declined Since this visit was completed virtually, some vitals may be partially provided or unavailable. Missing vitals are due to the limitations of the virtual format.: Documented vitals are patient reported If Telephone or Video please confirm:: I connected with patient using audio/video enable telemedicine. I verified patient identity with two identifiers, discussed telehealth limitations, and patient agreed to proceed. Patient Location:: Home Provider Location:: Caregiver Interpreter Needed?: No Pre-visit prep was completed: no AWV questionnaire completed by patient prior to visit?: yes Living arrangements:: with family/others (resides with Dtr) Patient's Overall Health Status Rating: (!) fair Typical amount of pain: none Does pain affect daily life?: no Are you currently prescribed opioids?: no  Dietary Habits and Nutritional Risks How many meals a day?: (!) 1 (drinks Ensure throughout the day) Eats fruit and vegetables daily?: yes Most meals are obtained by: having others provide food In the last 2 weeks, have you had any of the following?: none Diabetic:: no  Functional Status Activities of Daily Living (to include ambulation/medication): (!) Needs Assist Feeding: Independent Dressing/Grooming: Dependent (Dtrs help) Bathing: Dependent (Dtrs help) Toileting:  Independent Transfer: Independent Ambulation: Independent with device- listed below Home Assistive Devices/Equipment: Walker (specify Type); Eyeglasses Medication Administration: Dependent (Dtrs help) Is this a change from baseline?: Change from baseline, expected to last >3 days Home Management (perform basic housework or laundry): Dependent (Dtrs help) Manage your own finances?: (!) no (Dtr handles) Primary transportation is: family / friends Concerns about vision?: no *vision screening is required for WTM* Concerns about hearing?: no  Fall Screening Falls in the past year?: 0 Number of falls in past year: 0 Was there an injury with Fall?: 0 Fall Risk Category Calculator: 0 Patient Fall Risk Level: Low Fall Risk  Fall Risk Patient at Risk for Falls Due to: No Fall Risks; Impaired balance/gait Fall risk Follow up: Falls evaluation completed; Falls prevention discussed  Home and Transportation Safety: All rugs have non-skid backing?: N/A, no rugs All stairs or steps have railings?: N/A, no stairs Grab bars in the bathtub or shower?: yes Have non-skid surface in bathtub or shower?: yes Good home lighting?: yes Regular seat belt use?: yes Hospital stays in the last year:: no  Cognitive Assessment Difficulty concentrating, remembering, or making decisions? : yes Will 6CIT or Mini Cog be Completed: no (pt declined) 6CIT or Mini Cog Declined: patient has a diagnosis of dementia or cognitive impairment  Advance Directives (For Healthcare) Does Patient Have a Medical Advance Directive?: No Would patient like information on creating a medical advance directive?: No - Patient declined  Reviewed/Updated  Reviewed/Updated: Reviewed All (Medical, Surgical, Family, Medications, Allergies, Care Teams, Patient Goals)    Allergies (verified) Bystolic  [nebivolol  hcl], Aspirin, Cardizem [diltiazem hcl], Amlodipine , Metoclopramide hcl, and Spironolactone   Current Medications  (verified) Outpatient Encounter Medications as of 08/03/2024  Medication Sig   acetaminophen  (TYLENOL ) 500 MG tablet Take 500 mg by mouth every 6 (six)  hours as needed.    albuterol  (PROVENTIL  HFA;VENTOLIN  HFA) 108 (90 Base) MCG/ACT inhaler Inhale 1-2 puffs into the lungs every 4 (four) hours as needed for wheezing or shortness of breath.   atorvastatin  (LIPITOR) 40 MG tablet Take 1 tablet (40 mg total) by mouth daily.   clopidogrel  (PLAVIX ) 75 MG tablet Take 1 tablet (75 mg total) by mouth daily.   cycloSPORINE  (RESTASIS ) 0.05 % ophthalmic emulsion Place 1 drop into both eyes 2 (two) times daily.   donepezil  (ARICEPT ) 10 MG tablet TAKE 1 TABLET BY MOUTH DAILY   feeding supplement, ENSURE ENLIVE, (ENSURE ENLIVE) LIQD Take 237 mLs by mouth 2 (two) times daily between meals.   fluticasone  (FLONASE ) 50 MCG/ACT nasal spray Place 2 sprays into both nostrils daily as needed for allergies or rhinitis.   gabapentin  (NEURONTIN ) 100 MG capsule Take 1 capsule (100 mg total) by mouth 3 (three) times daily as needed (leg and lower back pain).   hydrALAZINE  (APRESOLINE ) 100 MG tablet Take 1 tablet (100 mg total) by mouth 3 (three) times daily.   hydrochlorothiazide  (HYDRODIURIL ) 12.5 MG tablet Take 1 tablet (12.5 mg total) by mouth daily.   hydrocortisone  2.5 % cream Apply topically 2 (two) times daily.   levocetirizine (XYZAL ) 5 MG tablet TAKE 1 TABLET BY MOUTH EVERYDAY AT BEDTIME   losartan  (COZAAR ) 100 MG tablet TAKE 1 TABLET BY MOUTH EVERY DAY   meclizine  (ANTIVERT ) 25 MG tablet TAKE 1/2 TO 1 TABLET BY  MOUTH 3 TIMES DAILY AS  NEEDED FOR DIZZINESS OR  NAUSEA   memantine  (NAMENDA ) 5 MG tablet take 1 tablet (5 mg) twice a day   mirtazapine  (REMERON ) 7.5 MG tablet Take 1 tablet (7.5 mg total) by mouth at bedtime.   montelukast  (SINGULAIR ) 10 MG tablet Take 1 tablet (10 mg total) by mouth at bedtime.   pantoprazole  (PROTONIX ) 20 MG tablet Take 1 tablet (20 mg total) by mouth daily.   potassium chloride  SA  (KLOR-CON  M) 20 MEQ tablet Take 1 tablet (20 mEq total) by mouth daily.   silver  sulfADIAZINE  (SILVADENE ) 1 % cream Apply 1 Application topically daily.   triamcinolone  cream (KENALOG ) 0.1 % Apply topically 2 (two) times daily.   No facility-administered encounter medications on file as of 08/03/2024.    History: Past Medical History:  Diagnosis Date   Allergy    seasonal   Asthma    GERD (gastroesophageal reflux disease)    Hyperlipidemia    Hypertension    Transient cerebral ischemia    Past Surgical History:  Procedure Laterality Date   ABDOMINAL HYSTERECTOMY     for fibroids (no BSO)   BUNIONECTOMY Right    CARPAL TUNNEL RELEASE     Bilaterally   COLONOSCOPY  2011   tiny polyp ; [facility]   Macular Degeneration     Dr Elner  ; injections   UMBILICAL HERNIA REPAIR     Family History  Problem Relation Age of Onset   Alzheimer's disease Mother    Hypertension Mother    Stroke Mother 20   Hypertension Father    Heart attack Father        in 80's   Hypertension Maternal Grandmother    Heart attack Maternal Grandmother 48   Diabetes Other        1/2 sister   Colon cancer Neg Hx    Esophageal cancer Neg Hx    Pancreatic cancer Neg Hx    Kidney disease Neg Hx    Liver disease  Neg Hx    Social History   Occupational History   Not on file  Tobacco Use   Smoking status: Former    Current packs/day: 0.00    Types: Cigarettes    Quit date: 08/24/1979    Years since quitting: 44.9   Smokeless tobacco: Never   Tobacco comments:    smoked  1966-1981, up to 1 ppd  Vaping Use   Vaping status: Never Used  Substance and Sexual Activity   Alcohol use: No    Alcohol/week: 0.0 standard drinks of alcohol   Drug use: No   Sexual activity: Never   Tobacco Counseling Counseling given: Not Answered Tobacco comments: smoked  1966-1981, up to 1 ppd  SDOH Screenings   Food Insecurity: No Food Insecurity (08/03/2024)  Housing: Unknown (08/03/2024)  Transportation  Needs: No Transportation Needs (08/03/2024)  Utilities: Not At Risk (08/03/2024)  Depression (PHQ2-9): Low Risk (08/03/2024)  Financial Resource Strain: Low Risk (04/26/2024)  Physical Activity: Inactive (08/03/2024)  Social Connections: Socially Isolated (08/03/2024)  Stress: No Stress Concern Present (08/03/2024)  Tobacco Use: Medium Risk (08/03/2024)  Health Literacy: Adequate Health Literacy (08/03/2024)   See flowsheets for full screening details  Depression Screen PHQ 2 & 9 Depression Scale- Over the past 2 weeks, how often have you been bothered by any of the following problems? Little interest or pleasure in doing things: 0 Feeling down, depressed, or hopeless (PHQ Adolescent also includes...irritable): 0 PHQ-2 Total Score: 0 Trouble falling or staying asleep, or sleeping too much: 0 Feeling tired or having little energy: 0 Poor appetite or overeating (PHQ Adolescent also includes...weight loss): 0 Feeling bad about yourself - or that you are a failure or have let yourself or your family down: 0 Trouble concentrating on things, such as reading the newspaper or watching television (PHQ Adolescent also includes...like school work): 0 Moving or speaking so slowly that other people could have noticed. Or the opposite - being so fidgety or restless that you have been moving around a lot more than usual: 3 (uses a walker) Thoughts that you would be better off dead, or of hurting yourself in some way: 0 PHQ-9 Total Score: 3 If you checked off any problems, how difficult have these problems made it for you to do your work, take care of things at home, or get along with other people?: Not difficult at all  Depression Treatment Depression Interventions/Treatment : Medication; Currently on Treatment; PHQ2-9 Score <4 Follow-up Not Indicated     Goals Addressed               This Visit's Progress     Patient Stated (pt-stated)        Patient & Dtr stated the goal is to stretching her  muscles daily with walker             Objective:    Today's Vitals   08/03/24 1311  Weight: 150 lb (68 kg)  Height: 4' 11 (1.499 m)   Body mass index is 30.3 kg/m.  Hearing/Vision screen Hearing Screening - Comments:: Denies hearing difficulties   Vision Screening - Comments:: Wears rx glasses - up to date with routine eye exams with [facility] Immunizations and Health Maintenance Health Maintenance  Topic Date Due   Zoster Vaccines- Shingrix (1 of 2) 03/08/1985   DTaP/Tdap/Td (2 - Td or Tdap) 11/15/2020   COVID-19 Vaccine (6 - 2025-26 season) 04/23/2024   Medicare Annual Wellness (AWV)  08/03/2025   Pneumococcal Vaccine: 50+ Years  Completed   Influenza Vaccine  Completed   Bone Density Scan  Completed   Meningococcal B Vaccine  Aged Out        Assessment/Plan:  This is a routine wellness examination for Stacy Page.  Patient Care Team: Rollene Almarie LABOR, MD as PCP - General (Internal Medicine) Claudene Victory ORN, MD (Inactive) as PCP - Cardiology (Cardiology) Gershon Donnice SAUNDERS, DPM as Consulting Physician (Podiatry) Georjean Darice HERO, MD as Consulting Physician (Neurology)  I have personally reviewed and noted the following in the patients chart:   Medical and social history Use of alcohol, tobacco or illicit drugs  Current medications and supplements including opioid prescriptions. Functional ability and status Nutritional status Physical activity Advanced directives List of other physicians Hospitalizations, surgeries, and ER visits in previous 12 months Vitals Screenings to include cognitive, depression, and falls Referrals and appointments  No orders of the defined types were placed in this encounter.  In addition, I have reviewed and discussed with patient certain preventive protocols, quality metrics, and best practice recommendations. A written personalized care plan for preventive services as well as general preventive health recommendations  were provided to patient.   Stacy Page, CMA   08/03/2024   Return in 1 year (on 08/03/2025).  After Visit Summary: (MyChart) Due to this being a telephonic visit, the after visit summary with patients personalized plan was offered to patient via MyChart   Nurse Notes: Appointment(s) made: (scheduled 6-mth f/u w/PCP for 08/2024;scheduled 2026 AWV/CPE appts)

## 2024-08-03 NOTE — Patient Instructions (Addendum)
 Stacy Page,  Thank you for taking the time for your Medicare Wellness Visit. I appreciate your continued commitment to your health goals. Please review the care plan we discussed, and feel free to reach out if I can assist you further.  Please note that Annual Wellness Visits do not include a physical exam. Some assessments may be limited, especially if the visit was conducted virtually. If needed, we may recommend an in-person follow-up with your provider.  Ongoing Care Seeing your primary care provider every 3 to 6 months helps us  monitor your health and provide consistent, personalized care.   Referrals If a referral was made during today's visit and you haven't received any updates within two weeks, please contact the referred provider directly to check on the status.  Recommended Screenings:  Health Maintenance  Topic Date Due   Zoster (Shingles) Vaccine (1 of 2) 03/08/1985   DTaP/Tdap/Td vaccine (2 - Td or Tdap) 11/15/2020   COVID-19 Vaccine (6 - 2025-26 season) 04/23/2024   Medicare Annual Wellness Visit  08/03/2025   Pneumococcal Vaccine for age over 57  Completed   Flu Shot  Completed   Osteoporosis screening with Bone Density Scan  Completed   Meningitis B Vaccine  Aged Out       08/03/2024    1:14 PM  Advanced Directives  Does Patient Have a Medical Advance Directive? No  Would patient like information on creating a medical advance directive? No - Patient declined    Vision: Annual vision screenings are recommended for early detection of glaucoma, cataracts, and diabetic retinopathy. These exams can also reveal signs of chronic conditions such as diabetes and high blood pressure.  Dental: Annual dental screenings help detect early signs of oral cancer, gum disease, and other conditions linked to overall health, including heart disease and diabetes.

## 2024-08-15 ENCOUNTER — Other Ambulatory Visit: Payer: Self-pay | Admitting: Internal Medicine

## 2024-08-31 ENCOUNTER — Other Ambulatory Visit: Payer: Self-pay | Admitting: Internal Medicine

## 2024-09-05 ENCOUNTER — Ambulatory Visit: Admitting: Internal Medicine

## 2024-11-26 ENCOUNTER — Ambulatory Visit: Admitting: Physician Assistant

## 2025-08-14 ENCOUNTER — Ambulatory Visit

## 2025-08-14 ENCOUNTER — Encounter: Admitting: Internal Medicine
